# Patient Record
Sex: Male | Born: 1950 | Race: White | Hispanic: No | Marital: Married | Smoking: Former smoker
Health system: Southern US, Community
[De-identification: ages and names within clinical notes are randomized; demographics above are authoritative.]

## PROBLEM LIST (undated history)

## (undated) DIAGNOSIS — E785 Hyperlipidemia, unspecified: Secondary | ICD-10-CM

## (undated) DIAGNOSIS — I10 Essential (primary) hypertension: Secondary | ICD-10-CM

## (undated) DIAGNOSIS — Z96659 Presence of unspecified artificial knee joint: Secondary | ICD-10-CM

## (undated) DIAGNOSIS — R001 Bradycardia, unspecified: Secondary | ICD-10-CM

## (undated) DIAGNOSIS — H9319 Tinnitus, unspecified ear: Secondary | ICD-10-CM

## (undated) DIAGNOSIS — J45909 Unspecified asthma, uncomplicated: Secondary | ICD-10-CM

## (undated) DIAGNOSIS — I2510 Atherosclerotic heart disease of native coronary artery without angina pectoris: Secondary | ICD-10-CM

## (undated) DIAGNOSIS — M1990 Unspecified osteoarthritis, unspecified site: Secondary | ICD-10-CM

## (undated) DIAGNOSIS — I214 Non-ST elevation (NSTEMI) myocardial infarction: Secondary | ICD-10-CM

## (undated) DIAGNOSIS — K219 Gastro-esophageal reflux disease without esophagitis: Secondary | ICD-10-CM

## (undated) DIAGNOSIS — T7840XA Allergy, unspecified, initial encounter: Secondary | ICD-10-CM

## (undated) DIAGNOSIS — J069 Acute upper respiratory infection, unspecified: Secondary | ICD-10-CM

## (undated) DIAGNOSIS — A419 Sepsis, unspecified organism: Secondary | ICD-10-CM

## (undated) HISTORY — DX: Acute upper respiratory infection, unspecified: J06.9

## (undated) HISTORY — PX: BACK SURGERY: SHX140

## (undated) HISTORY — DX: Tinnitus, unspecified ear: H93.19

## (undated) HISTORY — PX: LUMBAR DISC SURGERY: SHX700

## (undated) HISTORY — DX: Gilbert syndrome: E80.4

## (undated) HISTORY — DX: Presence of unspecified artificial knee joint: Z96.659

## (undated) HISTORY — PX: JOINT REPLACEMENT: SHX530

## (undated) HISTORY — PX: KNEE ARTHROSCOPY: SHX127

## (undated) HISTORY — DX: Hyperlipidemia, unspecified: E78.5

## (undated) HISTORY — DX: Allergy, unspecified, initial encounter: T78.40XA

## (undated) HISTORY — PX: TOTAL KNEE ARTHROPLASTY: SHX125

---

## 1998-07-16 ENCOUNTER — Emergency Department (HOSPITAL_COMMUNITY): Admission: EM | Admit: 1998-07-16 | Discharge: 1998-07-16 | Payer: Self-pay | Admitting: Emergency Medicine

## 2000-01-02 ENCOUNTER — Encounter: Payer: Self-pay | Admitting: Neurosurgery

## 2000-01-06 ENCOUNTER — Ambulatory Visit (HOSPITAL_COMMUNITY): Admission: RE | Admit: 2000-01-06 | Discharge: 2000-01-06 | Payer: Self-pay | Admitting: Neurosurgery

## 2000-01-06 ENCOUNTER — Encounter: Payer: Self-pay | Admitting: Neurosurgery

## 2002-01-04 ENCOUNTER — Ambulatory Visit (HOSPITAL_COMMUNITY): Admission: RE | Admit: 2002-01-04 | Discharge: 2002-01-04 | Payer: Self-pay | Admitting: *Deleted

## 2004-03-26 ENCOUNTER — Inpatient Hospital Stay (HOSPITAL_COMMUNITY): Admission: RE | Admit: 2004-03-26 | Discharge: 2004-03-29 | Payer: Self-pay | Admitting: Orthopedic Surgery

## 2004-03-30 ENCOUNTER — Emergency Department (HOSPITAL_COMMUNITY): Admission: EM | Admit: 2004-03-30 | Discharge: 2004-03-30 | Payer: Self-pay | Admitting: Emergency Medicine

## 2004-09-15 ENCOUNTER — Inpatient Hospital Stay (HOSPITAL_COMMUNITY): Admission: RE | Admit: 2004-09-15 | Discharge: 2004-09-18 | Payer: Self-pay | Admitting: Orthopedic Surgery

## 2005-01-02 ENCOUNTER — Encounter: Admission: RE | Admit: 2005-01-02 | Discharge: 2005-01-02 | Payer: Self-pay | Admitting: Neurosurgery

## 2011-01-03 ENCOUNTER — Encounter: Payer: Self-pay | Admitting: Neurosurgery

## 2011-01-14 DIAGNOSIS — A419 Sepsis, unspecified organism: Secondary | ICD-10-CM

## 2011-01-14 HISTORY — DX: Sepsis, unspecified organism: A41.9

## 2011-01-14 HISTORY — PX: I & D KNEE WITH POLY EXCHANGE: SHX5024

## 2011-02-03 ENCOUNTER — Ambulatory Visit (HOSPITAL_COMMUNITY): Payer: PRIVATE HEALTH INSURANCE

## 2011-02-03 ENCOUNTER — Inpatient Hospital Stay (HOSPITAL_COMMUNITY)
Admission: AD | Admit: 2011-02-03 | Discharge: 2011-02-06 | DRG: 486 | Disposition: A | Discharge status: Home Health | Payer: PRIVATE HEALTH INSURANCE | Source: Ambulatory Visit | Attending: Orthopedic Surgery | Admitting: Orthopedic Surgery

## 2011-02-03 ENCOUNTER — Encounter (HOSPITAL_COMMUNITY): Payer: Self-pay | Admitting: Radiology

## 2011-02-03 DIAGNOSIS — T8450XA Infection and inflammatory reaction due to unspecified internal joint prosthesis, initial encounter: Principal | ICD-10-CM | POA: Diagnosis present

## 2011-02-03 DIAGNOSIS — M009 Pyogenic arthritis, unspecified: Secondary | ICD-10-CM | POA: Diagnosis present

## 2011-02-03 DIAGNOSIS — I10 Essential (primary) hypertension: Secondary | ICD-10-CM | POA: Diagnosis present

## 2011-02-03 DIAGNOSIS — R509 Fever, unspecified: Secondary | ICD-10-CM

## 2011-02-03 DIAGNOSIS — Y92009 Unspecified place in unspecified non-institutional (private) residence as the place of occurrence of the external cause: Secondary | ICD-10-CM

## 2011-02-03 DIAGNOSIS — Z96659 Presence of unspecified artificial knee joint: Secondary | ICD-10-CM

## 2011-02-03 DIAGNOSIS — K219 Gastro-esophageal reflux disease without esophagitis: Secondary | ICD-10-CM | POA: Diagnosis present

## 2011-02-03 DIAGNOSIS — B370 Candidal stomatitis: Secondary | ICD-10-CM | POA: Diagnosis not present

## 2011-02-03 DIAGNOSIS — B009 Herpesviral infection, unspecified: Secondary | ICD-10-CM | POA: Diagnosis not present

## 2011-02-03 DIAGNOSIS — D62 Acute posthemorrhagic anemia: Secondary | ICD-10-CM | POA: Diagnosis not present

## 2011-02-03 DIAGNOSIS — Y831 Surgical operation with implant of artificial internal device as the cause of abnormal reaction of the patient, or of later complication, without mention of misadventure at the time of the procedure: Secondary | ICD-10-CM | POA: Diagnosis present

## 2011-02-03 DIAGNOSIS — E876 Hypokalemia: Secondary | ICD-10-CM | POA: Diagnosis not present

## 2011-02-03 DIAGNOSIS — E785 Hyperlipidemia, unspecified: Secondary | ICD-10-CM | POA: Diagnosis present

## 2011-02-03 DIAGNOSIS — Z7982 Long term (current) use of aspirin: Secondary | ICD-10-CM

## 2011-02-03 DIAGNOSIS — J329 Chronic sinusitis, unspecified: Secondary | ICD-10-CM | POA: Diagnosis present

## 2011-02-03 DIAGNOSIS — Z87891 Personal history of nicotine dependence: Secondary | ICD-10-CM

## 2011-02-03 DIAGNOSIS — G4700 Insomnia, unspecified: Secondary | ICD-10-CM | POA: Diagnosis not present

## 2011-02-03 HISTORY — DX: Essential (primary) hypertension: I10

## 2011-02-03 LAB — CBC
HCT: 47.9 % (ref 39.0–52.0)
Hemoglobin: 17.4 g/dL — ABNORMAL HIGH (ref 13.0–17.0)
MCHC: 36.3 g/dL — ABNORMAL HIGH (ref 30.0–36.0)
MCV: 89 fL (ref 78.0–100.0)
WBC: 7.1 10*3/uL (ref 4.0–10.5)

## 2011-02-03 LAB — URINALYSIS, ROUTINE W REFLEX MICROSCOPIC
Bilirubin Urine: NEGATIVE
Hgb urine dipstick: NEGATIVE
Ketones, ur: NEGATIVE mg/dL
Nitrite: NEGATIVE
Protein, ur: NEGATIVE mg/dL
Specific Gravity, Urine: 1.022 (ref 1.005–1.030)
Urobilinogen, UA: 1 mg/dL (ref 0.0–1.0)

## 2011-02-03 LAB — C-REACTIVE PROTEIN: CRP: 14.2 mg/dL — ABNORMAL HIGH (ref <0.6)

## 2011-02-03 LAB — COMPREHENSIVE METABOLIC PANEL
Alkaline Phosphatase: 69 U/L (ref 39–117)
BUN: 11 mg/dL (ref 6–23)
Chloride: 99 mEq/L (ref 96–112)
Creatinine, Ser: 0.84 mg/dL (ref 0.4–1.5)
GFR calc non Af Amer: >60 mL/min (ref >60)
Glucose, Bld: 101 mg/dL — ABNORMAL HIGH (ref 70–99)
Potassium: 3.6 mEq/L (ref 3.5–5.1)
Total Bilirubin: 2.3 mg/dL — ABNORMAL HIGH (ref 0.3–1.2)

## 2011-02-03 LAB — SEDIMENTATION RATE: Sed Rate: 29 mm/hr — ABNORMAL HIGH (ref 0–16)

## 2011-02-03 LAB — SURGICAL PCR SCREEN: Staphylococcus aureus: NEGATIVE

## 2011-02-03 LAB — GRAM STAIN

## 2011-02-04 ENCOUNTER — Inpatient Hospital Stay (HOSPITAL_COMMUNITY): Payer: PRIVATE HEALTH INSURANCE

## 2011-02-04 DIAGNOSIS — R011 Cardiac murmur, unspecified: Secondary | ICD-10-CM

## 2011-02-04 LAB — CBC
HCT: 32.5 % — ABNORMAL LOW (ref 39.0–52.0)
MCH: 31.7 pg (ref 26.0–34.0)
MCV: 89.5 fL (ref 78.0–100.0)
Platelets: 173 10*3/uL (ref 150–400)
RBC: 3.63 MIL/uL — ABNORMAL LOW (ref 4.22–5.81)

## 2011-02-04 LAB — GRAM STAIN

## 2011-02-04 LAB — URINE CULTURE
Colony Count: NO GROWTH
Culture  Setup Time: 201202211638

## 2011-02-04 LAB — COMPREHENSIVE METABOLIC PANEL
ALT: 16 U/L (ref 0–53)
AST: 18 U/L (ref 0–37)
Alkaline Phosphatase: 44 U/L (ref 39–117)
CO2: 28 mEq/L (ref 19–32)
Chloride: 100 mEq/L (ref 96–112)
GFR calc Af Amer: >60 mL/min (ref >60)
GFR calc non Af Amer: >60 mL/min (ref >60)
Glucose, Bld: 140 mg/dL — ABNORMAL HIGH (ref 70–99)
Sodium: 138 mEq/L (ref 135–145)
Total Bilirubin: 1.8 mg/dL — ABNORMAL HIGH (ref 0.3–1.2)

## 2011-02-05 LAB — COMPREHENSIVE METABOLIC PANEL
ALT: 12 U/L (ref 0–53)
AST: 15 U/L (ref 0–37)
Albumin: 2.6 g/dL — ABNORMAL LOW (ref 3.5–5.2)
Calcium: 8 mg/dL — ABNORMAL LOW (ref 8.4–10.5)
Sodium: 138 mEq/L (ref 135–145)
Total Protein: 4.8 g/dL — ABNORMAL LOW (ref 6.0–8.3)

## 2011-02-05 LAB — GC/CHLAMYDIA PROBE AMP, URINE: GC Probe Amp, Urine: NEGATIVE

## 2011-02-05 LAB — CBC
HCT: 28.3 % — ABNORMAL LOW (ref 39.0–52.0)
MCV: 90.1 fL (ref 78.0–100.0)
RBC: 3.14 MIL/uL — ABNORMAL LOW (ref 4.22–5.81)
WBC: 5.7 10*3/uL (ref 4.0–10.5)

## 2011-02-05 LAB — WOUND CULTURE: Culture: NO GROWTH

## 2011-02-06 LAB — BASIC METABOLIC PANEL
CO2: 29 mEq/L (ref 19–32)
Calcium: 8.5 mg/dL (ref 8.4–10.5)
Chloride: 101 mEq/L (ref 96–112)
Creatinine, Ser: 0.81 mg/dL (ref 0.4–1.5)
GFR calc Af Amer: >60 mL/min (ref >60)
Glucose, Bld: 101 mg/dL — ABNORMAL HIGH (ref 70–99)

## 2011-02-06 LAB — CBC
HCT: 28.8 % — ABNORMAL LOW (ref 39.0–52.0)
Hemoglobin: 10.1 g/dL — ABNORMAL LOW (ref 13.0–17.0)
MCH: 31.6 pg (ref 26.0–34.0)
MCHC: 35.1 g/dL (ref 30.0–36.0)
RBC: 3.2 MIL/uL — ABNORMAL LOW (ref 4.22–5.81)

## 2011-02-06 LAB — WOUND CULTURE

## 2011-02-06 NOTE — Op Note (Signed)
NAME:  Louis Perez, Louis Perez NO.:  [id number]  MEDICAL RECORD NO.:  [id number]           PATIENT TYPE:  I  LOCATION:  5038                         FACILITY:  [facility]  PHYSICIAN:  Eulas Post, MD    DATE OF BIRTH:  January 06, 1951  DATE OF PROCEDURE:  02/03/2011 DATE OF DISCHARGE:                              OPERATIVE REPORT   PREOPERATIVE DIAGNOSIS:  Septic right total knee arthroplasty.  POSTOPERATIVE DIAGNOSIS:  Septic right total knee arthroplasty.  OPERATIVE PROCEDURE:  Incision and drainage was poly exchange and complete synovectomy of 3 compartments of the right total knee replacement.  ANESTHESIA:  General.  ESTIMATED BLOOD LOSS:  About 100 mL.  TOURNIQUET TIME:  1 hour and 10 minutes.  OPERATIVE IMPLANTS:  I removed a [employer] polyethylene size 9 x 12 insert and replaced it with the same size insert.  PREOPERATIVE INDICATIONS:  Mr. Sadat Sliwa is a 60 year old gentleman who presented with acute onset of bilateral knee pain, many years after bilateral total knee replacements.  He is the patient of Dr. Sherene Sires.  Dr. Thurston Hole requested my assistance in his care, as he required bilateral knee I and D with polyethylene exchange.  These surgeries were performed simultaneously, however, these were done by separate surgeons.  Dr. Thurston Hole did the left side, while I performed the right side.  Additionally, Dr. Thurston Hole had Zonia Kief, PA-C as the left side assistant and I had Skip Mayer, PA-C as the right-sided assistant.  The patient elected to undergo the above-named procedures and risks, benefits and alternatives were discussed with him by Dr. Thurston Hole.  Please see his dictation for details.  OPERATIVE PROCEDURE:  The patient was brought to the operating room, placed in supine position.  IV antibiotics were held until after cultures were taken.  The right lower extremity was prepped and draped in the usual sterile fashion.  Midline approach was made  through his previous scar.  Parapatellar incision was performed, and I performed a complete synovectomy of the superior, medial, and lateral gutters as well as inferiorly and circumferentially around the patella and posterior to the patellar tendon.  There was necrotic-appearing tissue, and it appeared that his infection probably had been going on for definitely longer than 2 days.  The patient had previously indicated that his pain became significant 2 days ago.  After complete synovectomy was performed, I debrided aggressively all around the medial and lateral gutters as well as in the back of the knee using a lamina spreader to get to the back of the knee, as well as getting access to the posterior aspect of the tibial component.  I debrided this with a rongeur and a curette and cleaned all the surfaces. I irrigated 3 L of pulse lavage, and then went back and checked the synovium again for any necrotic debris and tissue.  All of this was debrided and removed, even from the size of the implant.  The polyethylene liner was removed.  The wounds were again irrigated with another 3 L of fluid.  After 6 L have been irrigated in, I then replaced the polyethylene with a new polyethylene and I did  changed gloves and got clean instruments at this portion of the case.  The knee was again debrided of any abnormal-appearing tissue including within the box of the femoral component.  After that, I irrigated again another 3 L for a total of 9 L and then placed 2 deep drains and repaired the parapatellar tissue with #1 PDS followed by 2-0 Vicryl for the subcutaneous tissue and then staples for the skin.  Sterile gauze was applied followed by a compressive Ace wrap.  The patient was awakened and returned to PACU in stable and satisfactory condition.  There were no complications and he tolerated the procedure well.  Skip Mayer, PA-C was present and scrubbed throughout the case and was critical for  completion in assistance with retraction, exposure, and closure.     Eulas Post, MD     JPL/MEDQ  D:  02/03/2011  T:  02/04/2011  Job:  [id number]  Electronically Signed by Teryl Lucy MD on 02/06/2011 11:19:56 AM

## 2011-02-08 LAB — ANAEROBIC CULTURE

## 2011-02-10 LAB — CULTURE, BLOOD (ROUTINE X 2)
Culture  Setup Time: 201202221800
Culture: NO GROWTH

## 2011-02-19 NOTE — Discharge Summary (Signed)
NAME:  Louis Perez, Louis Perez NO.:  [id number]  MEDICAL RECORD NO.:  [id number]           PATIENT TYPE:  I  LOCATION:  5038                         FACILITY:  [facility]  PHYSICIAN:  Elana Alm. Thurston Hole, M.D. DATE OF BIRTH:  08-04-1951  DATE OF ADMISSION:  02/03/2011 DATE OF DISCHARGE:  02/06/2011                              DISCHARGE SUMMARY   ADMITTING DIAGNOSES: 1. Bilateral septic knees. 2. Hypertension. 3. Hyperlipidemia. 4. Sinusitis. 5. History of a tick bite. 6. History of septicemia.  DISCHARGE DIAGNOSES: 1. 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 2. Acute blood loss anemia. 3. Hypertension. 4. Hypokalemia. 5. Hyperlipidemia. 6. Sinusitis. 7. Tick bite. 8. Oral thrush.  HISTORY OF PRESENT ILLNESS:  The patient is a 60 year old white male with a history of bilateral total knees done in 2005.  He has done very well with both total knee replacements until this past weekend he was hospitalized at [facility] on February 01, 2011, for septicemia, a fever of 103.  At that time, he was noted to have bilateral knee effusions and felt he would rather be treated in [place] than in [place] for his bilateral swollen knees.  He presented to our office on February 03, 2011, with acutely swollen bilateral total knees.  Left total knee replacement was done on September 15, 2004, right total knee replacement was done on March 26, 2004.  In the office, his left knee was aspirated of 100 mL of obvious purulent material.  His right knee was aspirated of 60 mL of obvious purulent material.  He had been on doxycycline for 48 hours and prior to that had been on Augmentin for some sinusitis.  He was admitted to the hospital for emergent I and D of both knees which he underwent that evening.  An echo was ordered that showed some thickening on his aortic valve but no obvious vegetation.  Blood cultures were ordered both at [facility] and here in  [place], both of them have persisted to be negative.  Infectious Disease was consulted for guidance with antibiotics and routine septicemia.  TESTING:  His HIV test was negative.  His Gonorrhea and Chlamydia tests were negative.  On postop day 1, the patient's potassium was low.  He was given 40 mEq of potassium to raise his potassium level.  He remained afebrile.  His hemoglobin was stable at 11.5.  CRP was high at 14.2. Sed rate was slightly high at 29.  The patient tolerated the CPM on postop day 1.  Postop day 2, the patient improved with physical therapy. His hemoglobin was 9.8.  He was metabolically stable.  His dressings were changed.  Home health was ordered for physical therapy, occupational therapy.  He ambulated 110 feet, 180 feet, and did ambulate up to stairs.  Postop day 3, the patient was alert and oriented.  He was able to do straight-leg raises with both legs.  He was able to ambulate at least 100 feet.  He had two bowel movements overnight.  He is being discharged to home in stable condition, weightbearing as tolerated, on IV antibiotics, home health physical therapy, home health occupational  therapy.  PROCEDURES IN-HOUSE:  On February 03, 2011, the patient underwent an incision and drainage of his right knee by Dr. Dion Saucier with Dr. Sherene Sires assisting and incision and drainage of his left knee by Dr. Thurston Hole with Dr. Dion Saucier assisting. He had bilateral femoral nerve block by Anesthesia.  DISCHARGE MEDICATIONS: 1. Ceftriaxone 1 g IV daily for 6 weeks. 2. Colace 100 mg one tablet twice a day. 3. Lovenox 30 mg subcu twice a day. 4. Dilaudid to 2 mg one to two tablets q.4-6 h. as needed for pain. 5. Methocarbamol 500 mg one tablet q.8 h. as needed for muscle spasm. 6. Nystatin 1000 units/mL, 5 mL swish and spit four times a day. 7. Rifampin 300 mg one tablet twice a day. 8. Vancomycin 1500 mg twice a day for 6 weeks. 9. Artificial Tears 1 drop in both eyes four times  a day. 10.Aspirin 81 mg daily. 11.Fish oil 1000 mg two tablets twice a day. 12.Hydrochlorothiazide 12.5 mg one tablet daily. 13.Multivitamin 1 tablet daily. 14.Pravastatin 40 mg one tablet daily. 15.Ranitidine 150 mg 2 tablets at bedtime. 16.Systane eye drops one drop in both eyes. 17.Temazepam 15 mg one to two tablets at night for sleep. 18.Verapamil SR 240 mg one tablet daily. He has been instructed to stop his doxycycline.  He needs to follow up with Dr. Thurston Hole in the office on February 16, 2011. He has been instructed to begin washing his both legs with soap and water on February 10, 2011.  He has been instructed to call our office with increased pain, increased swelling, increased redness, increased drainage, or a temperature greater than 101.     Kirstin Shepperson, PA-C   ______________________________ Elana Alm Thurston Hole, M.D.    KS/MEDQ  D:  02/06/2011  T:  02/06/2011  Job:  [id number]  Electronically Signed by Julien Girt P.A. on 02/16/2011 03:47:23 PM Electronically Signed by Salvatore Marvel M.D. on 02/18/2011 02:03:56 PM

## 2011-02-19 NOTE — Op Note (Signed)
NAME:  Louis Perez, Louis Perez NO.:  [id number]  MEDICAL RECORD NO.:  [id number]           PATIENT TYPE:  I  LOCATION:  5038                         FACILITY:  [facility]  PHYSICIAN:  Elana Alm. Thurston Hole, M.D. DATE OF BIRTH:  Sep 19, 1951  DATE OF PROCEDURE:  02/03/2011 DATE OF DISCHARGE:                              OPERATIVE REPORT   PREOPERATIVE DIAGNOSIS:  Septic left total knee arthroplasty.  POSTOPERATIVE DIAGNOSIS:  Septic left total knee arthroplasty.  PROCEDURE:  Incision and drainage with polyethylene exchange and complete synovectomy of three compartments of the left total knee replacement.  SURGEON:  Elana Alm. Thurston Hole, MD  ASSISTANT:  Eulas Post, MD and Zonia Kief, PA  ANESTHESIA:  General.  OPERATIVE TIME:  One hour and 10 minutes.  COMPLICATIONS:  None.  DESCRIPTION OF PROCEDURE:  Louis Perez was brought to the operating room on February 03, 2011, after bilateral femoral nerve blocks were placed in holding by Anesthesia.  He was placed on operative table in supine position.  Louis Perez had developed bilateral septic knee arthroplasties over the last 24-48 hours.  Dr. Teryl Lucy performed the right knee surgery and he will dictate his operative note separately while I performed the left knee procedure.  After Louis Perez was placed on the operative table in supine position. The knee was placed under general anesthesia.  Both knees were examined under anesthesia.  Range of motion 0-130 degrees.  Knees were stable with normal patellar tracking.  He had a Foley catheter placed under sterile conditions.  Antibiotics would not be given until new cultures were obtained at the time of the arthrotomies.  Both legs were then prepped using sterile DuraPrep and draped using sterile technique.  Time- out procedure was called and both knees were identified as the correct surgical sites.  The left leg was then exsanguinated and a thigh tourniquet elevated to  365 mm.  Through his previous anterior total knee incision, a new incision was made.  The underlying subcutaneous tissues were incised along with skin incision.  Median arthrotomy was performed revealing purulent fluid, which was sent for aerobic and anaerobic cultures.  Thorough debridement of this fluid was carried out.  The total knee components were well visualized and the femoral and tibial components were found to be well-fixed as well as the patella component. There was a large amount of very granulomatous thickened infected appearing synovial tissue and a very aggressive three compartment synovectomy was carried out.  Both medial, lateral and anterior and superior compartments thoroughly synovectomized and very carefully the posterior compartment was synovectomized and carefully protecting the neurovascular structures.  After this was done, then the polyethylene tray in the tibial component was removed.  At this point, then 9 liters of saline jet lavage solution was pulse lavaged through the knee and all compartments of the knee.  Very meticulous further synovectomy carried out as well.  After this was done, there was found to be excellent excision of all infected soft tissues and removal of the polyethylene implant.  The new polyethylene implant, which was a size 11 for his 11 tibial component with a 12 mm thickness  was then hammered into position with an excellent fit back into his existing well-fixed tibial tray. The knee was then reduced, taken through range of motion from 0-140 degrees with excellent stability and normal patellar tracking.  At this point, tourniquet was released.  Hemostasis obtained with cautery.  The wound was further irrigated with saline and then the arthrotomy was closed with #1 PDS suture.  Subcutaneous tissues were closed with 0 and 2-0 Vicryl, and skin closed with skin staples.  Sterile dressings were applied.  Similar procedure was being done  simultaneously by Dr. Dion Saucier and again he will dictate this operative note separately.  At this point, after both knees were finished and closed, needle and sponge count was correct x2 at the end of the case.  Neurovascular status normal.  Pulses 2+ and symmetric.  He was then awakened, extubated and taken to recovery room in stable condition.     Jayshon Dommer A. Thurston Hole, M.D.     RAW/MEDQ  D:  02/04/2011  T:  02/05/2011  Job:  [id number]  Electronically Signed by Salvatore Marvel M.D. on 02/18/2011 02:03:23 PM

## 2011-02-26 ENCOUNTER — Emergency Department (HOSPITAL_COMMUNITY)
Admission: EM | Admit: 2011-02-26 | Discharge: 2011-02-27 | Disposition: A | Discharge status: Home / Self Care | Payer: PRIVATE HEALTH INSURANCE | Attending: Emergency Medicine | Admitting: Emergency Medicine

## 2011-02-26 ENCOUNTER — Emergency Department (HOSPITAL_COMMUNITY)
Admission: EM | Admit: 2011-02-26 | Discharge: 2011-02-26 | Discharge status: Against Medical Advice | Payer: PRIVATE HEALTH INSURANCE | Attending: Emergency Medicine | Admitting: Emergency Medicine

## 2011-02-26 DIAGNOSIS — Y849 Medical procedure, unspecified as the cause of abnormal reaction of the patient, or of later complication, without mention of misadventure at the time of the procedure: Secondary | ICD-10-CM | POA: Insufficient documentation

## 2011-02-26 DIAGNOSIS — I10 Essential (primary) hypertension: Secondary | ICD-10-CM | POA: Insufficient documentation

## 2011-02-26 DIAGNOSIS — M009 Pyogenic arthritis, unspecified: Secondary | ICD-10-CM | POA: Insufficient documentation

## 2011-02-26 DIAGNOSIS — Z7982 Long term (current) use of aspirin: Secondary | ICD-10-CM | POA: Insufficient documentation

## 2011-02-26 DIAGNOSIS — T82898A Other specified complication of vascular prosthetic devices, implants and grafts, initial encounter: Secondary | ICD-10-CM | POA: Insufficient documentation

## 2011-02-26 DIAGNOSIS — T8450XA Infection and inflammatory reaction due to unspecified internal joint prosthesis, initial encounter: Secondary | ICD-10-CM | POA: Insufficient documentation

## 2011-02-26 DIAGNOSIS — Z79899 Other long term (current) drug therapy: Secondary | ICD-10-CM | POA: Insufficient documentation

## 2011-02-26 DIAGNOSIS — T82598A Other mechanical complication of other cardiac and vascular devices and implants, initial encounter: Secondary | ICD-10-CM | POA: Insufficient documentation

## 2011-05-05 ENCOUNTER — Encounter: Payer: Self-pay | Admitting: Infectious Diseases

## 2011-08-11 ENCOUNTER — Other Ambulatory Visit: Payer: Self-pay | Admitting: Otolaryngology

## 2011-08-11 ENCOUNTER — Ambulatory Visit
Admission: RE | Admit: 2011-08-11 | Discharge: 2011-08-11 | Disposition: A | Discharge status: Home / Self Care | Payer: PRIVATE HEALTH INSURANCE | Source: Ambulatory Visit | Attending: Otolaryngology | Admitting: Otolaryngology

## 2011-08-11 DIAGNOSIS — R51 Headache: Secondary | ICD-10-CM

## 2011-08-15 HISTORY — PX: NASAL SINUS SURGERY: SHX719

## 2011-08-31 ENCOUNTER — Encounter (HOSPITAL_COMMUNITY)
Admission: RE | Admit: 2011-08-31 | Discharge: 2011-08-31 | Disposition: A | Discharge status: Home / Self Care | Payer: PRIVATE HEALTH INSURANCE | Source: Ambulatory Visit | Attending: Otolaryngology | Admitting: Otolaryngology

## 2011-08-31 LAB — COMPREHENSIVE METABOLIC PANEL WITH GFR
ALT: 21 U/L (ref 0–53)
AST: 20 U/L (ref 0–37)
Albumin: 4.1 g/dL (ref 3.5–5.2)
Alkaline Phosphatase: 83 U/L (ref 39–117)
BUN: 22 mg/dL (ref 6–23)
Chloride: 101 meq/L (ref 96–112)
Potassium: 4.1 meq/L (ref 3.5–5.1)
Sodium: 141 meq/L (ref 135–145)
Total Bilirubin: 2.5 mg/dL — ABNORMAL HIGH (ref 0.3–1.2)

## 2011-08-31 LAB — CBC
HCT: 49.3 % (ref 39.0–52.0)
Hemoglobin: 17.6 g/dL — ABNORMAL HIGH (ref 13.0–17.0)
MCH: 31.8 pg (ref 26.0–34.0)
MCHC: 35.7 g/dL (ref 30.0–36.0)
MCV: 89 fL (ref 78.0–100.0)
Platelets: 205 10*3/uL (ref 150–400)
RBC: 5.54 MIL/uL (ref 4.22–5.81)
RDW: 13.4 % (ref 11.5–15.5)
WBC: 5.5 K/uL (ref 4.0–10.5)

## 2011-08-31 LAB — SURGICAL PCR SCREEN
MRSA, PCR: NEGATIVE
Staphylococcus aureus: NEGATIVE

## 2011-08-31 LAB — COMPREHENSIVE METABOLIC PANEL
CO2: 31 mEq/L (ref 19–32)
Calcium: 10 mg/dL (ref 8.4–10.5)
Creatinine, Ser: 0.83 mg/dL (ref 0.50–1.35)
GFR calc Af Amer: >60 mL/min (ref >60)
GFR calc non Af Amer: >60 mL/min (ref >60)
Glucose, Bld: 93 mg/dL (ref 70–99)
Total Protein: 7 g/dL (ref 6.0–8.3)

## 2011-09-03 ENCOUNTER — Ambulatory Visit (HOSPITAL_COMMUNITY)
Admission: RE | Admit: 2011-09-03 | Discharge: 2011-09-04 | Disposition: A | Discharge status: Home / Self Care | Payer: PRIVATE HEALTH INSURANCE | Source: Ambulatory Visit | Attending: Otolaryngology | Admitting: Otolaryngology

## 2011-09-03 ENCOUNTER — Other Ambulatory Visit: Payer: Self-pay | Admitting: Otolaryngology

## 2011-09-03 DIAGNOSIS — Z96659 Presence of unspecified artificial knee joint: Secondary | ICD-10-CM | POA: Insufficient documentation

## 2011-09-03 DIAGNOSIS — J320 Chronic maxillary sinusitis: Secondary | ICD-10-CM | POA: Insufficient documentation

## 2011-09-03 DIAGNOSIS — K219 Gastro-esophageal reflux disease without esophagitis: Secondary | ICD-10-CM | POA: Insufficient documentation

## 2011-09-03 DIAGNOSIS — Z01810 Encounter for preprocedural cardiovascular examination: Secondary | ICD-10-CM | POA: Insufficient documentation

## 2011-09-03 DIAGNOSIS — J323 Chronic sphenoidal sinusitis: Secondary | ICD-10-CM | POA: Insufficient documentation

## 2011-09-03 DIAGNOSIS — H9319 Tinnitus, unspecified ear: Secondary | ICD-10-CM | POA: Insufficient documentation

## 2011-09-03 DIAGNOSIS — I10 Essential (primary) hypertension: Secondary | ICD-10-CM | POA: Insufficient documentation

## 2011-09-03 DIAGNOSIS — J322 Chronic ethmoidal sinusitis: Secondary | ICD-10-CM | POA: Insufficient documentation

## 2011-09-03 DIAGNOSIS — Z01812 Encounter for preprocedural laboratory examination: Secondary | ICD-10-CM | POA: Insufficient documentation

## 2011-09-04 NOTE — H&P (Signed)
NAME:  Louis Perez, Louis Perez NO.:  [id number]  MEDICAL RECORD NO.:  [id number]  LOCATION:  SDSC                         FACILITY:  [facility]  PHYSICIAN:  Hermelinda Medicus, M.D.   DATE OF BIRTH:  05-08-1951  DATE OF ADMISSION:  09/03/2011 DATE OF DISCHARGE:                             HISTORY & PHYSICAL   HISTORY OF PRESENT ILLNESS:  This patient is a 60 year old male who has had considerable history of sinusitis problems.  He has had also some right ear tinnitus with clicking.  He has had this with some noise exposure in the past, but his history is furthermore complicated by the fact that he has had bilateral knee replacements and has had bilateral total knee infections and it was 6-1/2 months of irrigation and debridement and was on considerable amount of IV vancomycin and Rocephin.  Both knees have no swelling on final analysis and doing very well, but he has also had several sinus infections and has been on the course of vancomycin.  He has been on the cephalosporins and has also been on Flagyl.  The patient now enters for functional endoscopic sinus surgery, bilateral ethmoidectomy, sphenoidectomy, and maxillary sinus ostial enlargement as his CAT scan has shown considerable obstruction, in fact about 95% obstruction of the ethmoid and 50% obstruction of the maxillary and sphenoid.  He also has had an MRI of his head which was unclear.  As we were concerned about his ear symptoms, we felt possibly and more probably some of the symptoms are coming from his chronic sinusitis and drainage.  ALLERGIES:  His past history is one of allergies to OXYCODONE.  MEDICATIONS:  He takes Flagyl, verapamil, fish oil, aspirin, multivitamins, Pravastatin, ranitidine, and temazepam.  PAST MEDICAL HISTORY:  His medical history is significant for the fact that he does have some hypertension and his blood pressure was 160/100 this morning.  With the chronic sinusitis, he does have  elevated cholesterol and GERD.  FAMILY HISTORY:  Significant for diabetes, hypertension, and heart disease.  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  He does not smoke.  He does use alcohol just occasionally and has never used any drugs.  REVIEW OF SYSTEMS:  Just significant at this point, his knees are in good condition but he does have considerable headache with sinus discomfort.  Our plan is considering we will have to do functional endoscopic sinus surgery to try to open these sinuses to get them to drain.  PHYSICAL EXAMINATION:  VITAL SIGNS:  Blood pressure of 160/100. HEENT:  His ears are completely clear.  Tympanic membranes are clear. 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  His nasal examination reveals nasal polyps around the sphenoid and ethmoid region which were small but are tend to be obstructive in nature and considerable congestion in this region.  He has had some nasal fracture in the past but his septum is in quite good condition but his turbinates are very large and are pushing over against the natural ostia of his maxillary sinuses.  He complains primarily of frontal headache  but I think the ethmoids are the major causative of this problem as the frontal straights into the ethmoids.  His oral cavity is completely clear.  True cords, false cord, epiglottis, base of tongue, lateral pharyngeal walls are also clear of ulceration, mass, or lesion and true cord mobility, gag reflex, tongue mobility, EOMs, facial nerve are all symmetrical. NECK:  Free of any thyromegaly, cervical adenopathy, or mass.  Posterior triangles and scalp and skin are clear. CHEST:  Clear.  No rales, rhonchi or wheezes. CARDIOVASCULAR:  Normal S1 and S2.  No murmurs, rubs, or gallops. Normal EKG. EXTREMITIES:  His knees per Dr. Thurston Hole having had bilateral  total knees replacements with wound infections, but at this point stable and are clear of any infection.  INITIAL DIAGNOSES: 1. Pansinusitis. 2. History of bilateral knee replacements with postoperative     infections. 3. History of right ear tinnitus and decreased hearing and noise     exposure. 4. History of vancomycin use.          ______________________________ Hermelinda Medicus, M.D.     JC/MEDQ  D:  09/03/2011  T:  09/03/2011  Job:  [id number]  cc:   Molly Maduro A. Thurston Hole, M.D. Josephina Shih, M.D.  Electronically Signed by Hermelinda Medicus M.D. on 09/04/2011 09:33:46 AM

## 2011-09-04 NOTE — Op Note (Signed)
NAME:  Louis, Perez NO.:  [id number]  MEDICAL RECORD NO.:  [id number]  LOCATION:  SDSC                         FACILITY:  [facility]  PHYSICIAN:  Hermelinda Medicus, M.D.   DATE OF BIRTH:  [birth year]  DATE OF PROCEDURE:  09/03/2011 DATE OF DISCHARGE:                              OPERATIVE REPORT   HISTORY:  Mr. Louis Perez has had persistent pansinusitis, has had wound infections of his knees, and has had persistent headaches and also has had right ear symptoms.  He is aware of the risks and gains, has read the book on endoscopic sinus surgery.  The risk of working around the eyes, the base of skull, and its polyps, and CAT scan had been almost 90% obstructive to the ethmoid and 50-70% obstructive to the maxillary and sphenoid.  PREOPERATIVE DIAGNOSIS:  Bilateral ethmoid, bilateral sphenoid, and bilateral maxillary sinusitis with turbinate hypertrophy.  POSTOPERATIVE DIAGNOSIS:  Bilateral ethmoid, bilateral sphenoid, and bilateral maxillary sinusitis with turbinate hypertrophy.  OPERATION:  Functional endoscopic sinus surgery, bilateral ethmoidectomy, bilateral sphenoidectomy, and bilateral maxillary sinus ostial enlargement with reduction of inferior turbinates with Elmed bipolar cautery and reduction of the middle turbinates and medialization to gain space for the ethmoid sinuses.  ANESTHESIA:  Local MAC with Dr. Ivin Booty and 1% Xylocaine with epinephrine 9 mL and topical cocaine 200 mg.  OPERATOR:  Hermelinda Medicus, MD  DESCRIPTION OF PROCEDURE:  The patient was placed in supine position, under local MAC anesthesia, he was prepped and draped in usual Hibiclens and usual head drapes were used.  The patient was anesthetized using the topical first and then the 1% Xylocaine with epinephrine and inferior turbinates were aggressively outfractured and cauterized with the Elmed. The middle turbinates were medially oriented and were found to be large on the  fascial left side and was decreased in size just as there was some mild concha bullosa.  The sphenoid sinus was first approached where we found polyps around the natural ostium, but we could find the natural ostium and then penetrated this made it considerably larger and removed the polyps around this obstructing the sphenoid sinuses, did this on the right and the left with no difficulty.  Both sinuses were suctioned of some mucus, but the polyps were just at the openings of ostia.  The ethmoid sinus was just filled with nasal polyps with a polypoid debris and extended out into the nasal polypoid area as well.  We made the middle turbinates more medial and then entered the ethmoid sinus on the left using the 0 in the upbiting [place] using the straight and the upbiting Shann Medal in the 0170 and 30 degrees scopes and removed a considerable polypoid debris from the sinuses getting them to drain more appropriately.  Once this was completed, we then approached the right side and again medialized the middle turbinate, did not remove any mucous membrane, but removed polyps from the exit of the natural ostium of the ethmoid sinus and entered the sinus and again was filled with polypoid heavy polypoid debris.  All these were done separately for microscopic evaluation as well.  Using the upbiting and then the straight Shann Medal in the 70 and 30 degrees scopes.  We reviewed the entire ethmoid sinus which was about 90% obstructed were filled with polyps.  Once this was completed, we then approached the maxillary sinuses, found the natural ostium by palpation as it was just a pinhole, you could really not see it well but you could feel as you feel through this with a curved suction and then the side-biting forceps were used to increase the size of the natural ostium approximately five times a normal size and we used a side-biting as well as the [place] as well as the angled [employer].  The anterior of the sinus which showed some edema as it did around the ostium, but once this ostium left further drainage.  I think this will revert back to its more normal status.  Once this was completed, we placed Gelfoam in the ethmoid sinus, Gelfilm in the medial middle turbinates and 12 cm of Merocel packs with bacitracin ointment to keep swelling down.  Total blood loss was estimated at 15 mL.  The patient tolerated the procedure very well under local MAC and was in the recovery room doing well, will be kept overnight because of his the use of the heavier antibiotics which we used vancomycin an hour before this procedure 1.5 g.  We used Unasyn as advised by Dr. Ninetta Lights, at the Infectious Disease Department, 3 doses of 3 g and then will continue on with Keflex postop.  Followup will be overnight 23-hour observation and then it will be 5 days, 10 days, 3 weeks, 6 weeks, 3 months, 6 months, and a year.          ______________________________ Hermelinda Medicus, M.D.     JC/MEDQ  D:  09/03/2011  T:  09/03/2011  Job:  [id number]  cc:   Dr. Timmie Foerster A. Thurston Hole, M.D.  Electronically Signed by Hermelinda Medicus M.D. on 09/04/2011 09:33:49 AM

## 2011-09-09 NOTE — Discharge Summary (Signed)
NAME:  Louis Perez, Louis Perez NO.:  [id number]  MEDICAL RECORD NO.:  [id number]  LOCATION:  5114                         FACILITY:  [facility]  PHYSICIAN:  Hermelinda Medicus, M.D.   DATE OF BIRTH:  Apr 30, 1951  DATE OF ADMISSION:  09/03/2011 DATE OF DISCHARGE:                              DISCHARGE SUMMARY   HISTORY OF PRESENT ILLNESS:  This patient is a 60 year old male who has had considerable history of sinusitis problems.  He has had several rounds of antibiotics for his sinuses and has been seen by several physicians about this.  He also has a right ear tinnitus and clicking sensation that appears to be somewhat of a eustachian tube issue.  He also has some tinnitus secondary to some nose exposure as he was a Nurse, adult and chief of police in [place] and he has had also done some noisy work where he did protect his ears very well noticing this about a year ago.  His history is further complicated by the fact that he has had bilateral knee replacements and had bilateral total knee infections with a 6-1/2 months of irrigations and debridement with considerable IV vancomycin exposure as well as Rocephin IV.  Both knees have no swelling at this time and are doing very well and he still has, however, the sinusitis.  He has been on the cephalosporins, he has been on Flagyl, he has been on vancomycin but his CAT scan of recent shows partial opacification of the sphenoid, the ethmoid sinuses are totally opacified and involve 95% of this material and the maxillary sinuses are showing considerable thickening of fluid levels.  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  He has also had an MRI of his head which was clear of any abnormalities or lesions or tumor. So, with this situation, our plan is to try to do the sinus surgery to try to get his sinuses and his infections under  control to try to help the potential of the future knee infections and also possibly getting the sinus and all the drainage under control, the eustachian tube function would be better and that right ear would do better and have fewer symptoms.  ALLERGIES:  Severe allergy to OXYCODONE with anaphylaxis and throat swelling.  MEDICATIONS:  He also is taking Flagyl, verapamil, fish oil, aspirin, multivitamins, pravastatin, midodrine, and temazepam.  PAST MEDICAL HISTORY:  Significant and the fact that he has had some blood pressure issues and the blood pressure was 160/100 this morning. He also of course had a gastroesophageal reflux and some cholesterol elevation other than his sinusitis, his ear problem, and his total knee surgery with infection.  FAMILY HISTORY:  Significant for diabetes, hypertension, and heart disease.  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  He does not smoke, uses alcohol occasionally.  REVIEW OF SYSTEMS:  Significant that his knees are in good condition now and have no evidence of any infection.  He does have some considerable headache secondary to the sinus infections.  PHYSICAL EXAMINATION:  VITAL SIGNS:  Blood pressure 160/100. HEENT:  His  ears are completely clear.  Tympanic membranes move well and it showed no evidence of any abnormality within the tympanic membranes except for some slight sluggishness to the tympanic membranes.  He does have some nose exposure history.  He still complains about the clicking and the mild disability about this situation with some vertigo which makes him state he does not want to drive.  His nasal examination reveals nasal polyps around the ethmoid and sphenoid regions posteriorly.  These polyps were, however, small.  His ethmoid, sphenoid, and maxillary sinuses did not show obvious purulent drainage.  He had been on antibiotics but they have in the past.  He states he had a history of some nasal  fracture but I no deformity.  Septum is in quite good condition.  Middle turbinates are quite large.  The left shows what appears to be a concha bullosa.  He complains primarily of frontal headache today, especially in the right side.  The frontals are directly related to the ethmoids and the ethmoids are totally obstructed.  Oral cavity is completely clear.  True cords, false cords, epiglottis, base of tongue, lateral pharyngeal walls are clear and true cord mobility, gag reflex, tongue mobility, EOMs, and facial nerve are all symmetrical. No salivary gland abnormalities.  The lips, teeth, and gums are unremarkable. NECK:  Free of any thyromegaly, cervical adenopathy, or mass. CHEST:  Clear.  No rales, rhonchi, or wheezes. CARDIOVASCULAR:  Normal S1-S2.  No murmurs, opening snaps, or gallops. Normal EKG. EXTREMITIES:  Knees per Dr. Thurston Hole having had bilateral total knee replacements with wound infections, but stable and doing well.  INITIAL DIAGNOSES: 1. Pansinusitis. 2. History of bilateral knee replacements with postoperative     infections. 3. History of right ear tinnitus with decreased hearing with noise     exposure and history of vancomycin use as well as cephalosporin     use.  PLAN:  To do sinus surgery via endoscopic approach under local MAC anesthesia and we did a bilateral sphenoidectomy with bilateral ethmoidectomy with bilateral maxillary sinus ostial enlargement.  He did extremely well.  Blood loss was estimated at 50 mL and he has done well postoperatively.  LABORATORY DATA:  His labs have been essentially unremarkable.  He is MRSA negative.  His electrolytes are perfectly normal and his hematocrit was 49.3 with a white count of 5.5.  CT of his paranasal sinuses were in the chart and showed the mucosal thickening on the sinuses, and the sphenoid and the ethmoid air cells of course are blocked and the maxillary sinuses showing considerable thickening.  Chest x-ray  showed the PICC line tip overlies the costochondral junction.  This was done back on February 03, 2011.  No active cardiopulmonary process.  EKG was normal.  The patient has done extremely well postoperatively.  Packing was removed today with minimal bleeding and discomfort and his plan is improved.  FINAL DIAGNOSES: 1. 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 2. Elevated cholesterol. 3. Gastroesophageal reflux disease. 4. Blood pressure elevation. 5. History of right ear tinnitus with cracking sensation with mild     vertigo felt to be secondary possibly to noise exposure, vancomycin     use, or sinus posterior drainage.  He is improved.  His family have been informed and are caring for him at home and his followup would be in my office on September 07, 2011 three days from now and then in 1 week, 3 weeks, 6  weeks, 3 months, and a year.  His purpose for admission was the risk and the need for antibiotic.  He also has a anaphylaxis to oxycodone where he will be given morphine postoperatively 7.5 mg q.6 h.  He has been warned that he cannot use Ultram, Tylenol with Codeine, oxycodone.  He can use the morphine but that is highly habit-forming.          ______________________________ Hermelinda Medicus, M.D.     JC/MEDQ  D:  09/04/2011  T:  09/04/2011  Job:  [id number]  cc:   Molly Maduro A. Thurston Hole, M.D. Hale Bogus, M.D.  Electronically Signed by Hermelinda Medicus M.D. on 09/09/2011 09:02:51 AM

## 2012-05-26 HISTORY — PX: TRANSTHORACIC ECHOCARDIOGRAM: SHX275

## 2012-05-26 HISTORY — PX: CARDIOVASCULAR STRESS TEST: SHX262

## 2013-05-11 ENCOUNTER — Other Ambulatory Visit: Payer: Self-pay

## 2013-05-11 ENCOUNTER — Telehealth: Payer: Self-pay | Admitting: Neurology

## 2013-05-11 MED ORDER — OXCARBAZEPINE 150 MG PO TABS: 300.0000 mg | ORAL_TABLET | Freq: Three times a day (TID) | ORAL | Status: DC

## 2013-05-11 NOTE — Telephone Encounter (Signed)
Former [facility] patient, has not been assigned new MD.  Usually sees [place].  Auth refill through her and noted appt will be needed.  Eber Jones will assign new provider when she sees the patient.

## 2013-05-11 NOTE — Telephone Encounter (Signed)
Rx was already sent to the pharmacy at 10:28 this morning.  Please see chart.

## 2013-06-02 ENCOUNTER — Ambulatory Visit (INDEPENDENT_AMBULATORY_CARE_PROVIDER_SITE_OTHER): Payer: PRIVATE HEALTH INSURANCE | Admitting: Internal Medicine

## 2013-06-02 ENCOUNTER — Encounter: Payer: Self-pay | Admitting: Internal Medicine

## 2013-06-02 VITALS — BP 142/98 | HR 66 | Temp 97.1°F | Ht 75.0 in | Wt 219.0 lb

## 2013-06-02 DIAGNOSIS — J45901 Unspecified asthma with (acute) exacerbation: Secondary | ICD-10-CM

## 2013-06-02 DIAGNOSIS — R0602 Shortness of breath: Secondary | ICD-10-CM

## 2013-06-02 DIAGNOSIS — J4521 Mild intermittent asthma with (acute) exacerbation: Secondary | ICD-10-CM

## 2013-06-02 DIAGNOSIS — I10 Essential (primary) hypertension: Secondary | ICD-10-CM

## 2013-06-02 MED ORDER — AMOXICILLIN-POT CLAVULANATE 875-125 MG PO TABS: 1.0000 | ORAL_TABLET | Freq: Two times a day (BID) | ORAL | Status: DC

## 2013-06-02 MED ORDER — PREDNISONE (PAK) 10 MG PO TABS: ORAL_TABLET | ORAL | Status: DC

## 2013-06-02 MED ORDER — MOMETASONE FURO-FORMOTEROL FUM 200-5 MCG/ACT IN AERO: INHALATION_SPRAY | RESPIRATORY_TRACT | Status: DC

## 2013-06-02 MED ORDER — METHYLPREDNISOLONE ACETATE 80 MG/ML IJ SUSP
120.0000 mg | Freq: Once | INTRAMUSCULAR | Status: AC
  Administered 2013-06-02: 120 mg via INTRAMUSCULAR

## 2013-06-02 NOTE — Progress Notes (Addendum)
  Subjective:    Patient ID: Louis Perez, male    DOB: [birth year]  MRN: [id number]  HPI  58 yowm smoked only as teenager with chronic nasal congestion x decades worse in winters s/p sinus surgery 2013 then began having cough/ wheeze referred 06/02/2013 to pulmonary clinic by Dr Rosezetta Schlatter.  06/02/2013 1st pulmonary eval cc cough starting "shortly after" sinus surgery then breathing problems esp last 6 months assoc with change in voice. Onset of cough and sob was gradual, progressively worse with sense of chest congestion and production of thick green mucus but no vomiting.  No obvious daytime variabilty or assoc cp or overt  hb symptoms. No unusual exp hx or h/o childhood pna/ asthma or knowledge of premature birth.   Sleeping ok without nocturnal  or early am exacerbation  of respiratory  c/o's or need for noct saba. Also denies any obvious fluctuation of symptoms with weather or environmental changes or other aggravating or alleviating factors except as outlined above    Review of Systems  Constitutional: Negative for fever, chills, diaphoresis, activity change, appetite change, fatigue and unexpected weight change.  HENT: Positive for voice change. Negative for hearing loss, ear pain, nosebleeds, congestion, sore throat, facial swelling, rhinorrhea, sneezing, mouth sores, trouble swallowing, neck pain, neck stiffness, dental problem, postnasal drip, sinus pressure, tinnitus and ear discharge.   Eyes: Negative for photophobia, discharge, itching and visual disturbance.  Respiratory: Positive for cough, chest tightness, shortness of breath and wheezing. Negative for apnea, choking and stridor.   Cardiovascular: Negative for chest pain, palpitations and leg swelling.  Gastrointestinal: Negative for nausea, vomiting, abdominal pain, constipation, blood in stool and abdominal distention.  Genitourinary: Negative for dysuria, urgency, frequency, hematuria, flank pain, decreased urine volume and  difficulty urinating.  Musculoskeletal: Negative for myalgias, back pain, joint swelling, arthralgias and gait problem.  Skin: Negative for color change, pallor and rash.  Neurological: Negative for dizziness, tremors, seizures, syncope, speech difficulty, weakness, light-headedness, numbness and headaches.  Hematological: Negative for adenopathy. Does not bruise/bleed easily.  Psychiatric/Behavioral: Negative for confusion, sleep disturbance and agitation. The patient is not nervous/anxious.        Objective:   Physical Exam  Amb mod anxious wm nad with nl vital sins x for elevated bp  HEENT: nl dentition, turbinates, and orophanx. Nl external ear canals without cough reflex   NECK :  without JVD/Nodes/TM/ nl carotid upstrokes bilaterally   LUNGS: no acc muscle use, mid exp wheeze bilaterally  CV:  RRR  no s3 or murmur or increase in P2, no edema   ABD:  soft and nontender with nl excursion in the supine position. No bruits or organomegaly, bowel sounds nl  MS:  warm without deformities, calf tenderness, cyanosis or clubbing  SKIN: warm and dry without lesions    NEURO:  alert, approp, no deficits  cxr one month prior to OV per pt ok  Per [place]          Assessment & Plan:

## 2013-06-02 NOTE — Patient Instructions (Addendum)
augmentin 875 twice daily x 10 days Prednisone 10 mg take  4 each am x 2 days,   2 each am x 2 days,  1 each am x 2 days and stop  Dulera 200 Take 2 puffs first thing in am and then another 2 puffs about 12 hours later.   Only use your albuterol(ventolin) as a rescue medication to be used if you can't catch your breath by resting or doing a relaxed purse lip breathing pattern. The less you use it, the better it will work when you need it. Ok to use up to 2 puffs every 4 hours - don't leave home without the rescue inhaler  Pantoprazole (protonix) 40 mg   Take 30-60 min before first meal of the day and Pepcid 20 mg one bedtime until return to office - this is the best way to tell whether stomach acid is contributing to your problem.    GERD (REFLUX)  is an extremely common cause of respiratory symptoms, many times with no significant heartburn at all.    It can be treated with medication, but also with lifestyle changes including avoidance of late meals, excessive alcohol, smoking cessation, and avoid fatty foods, chocolate, peppermint, colas, red wine, and acidic juices such as orange juice.  NO MINT OR MENTHOL PRODUCTS SO NO COUGH DROPS  USE SUGARLESS CANDY INSTEAD (jolley ranchers or Stover's)  NO OIL BASED VITAMINS - use powdered substitutes- stop fish oil  For cough > mucinex dm 1200 mg every 12 hours  Please schedule a follow up office visit in 4 weeks, sooner if needed

## 2013-06-05 DIAGNOSIS — J4541 Moderate persistent asthma with (acute) exacerbation: Secondary | ICD-10-CM | POA: Insufficient documentation

## 2013-06-05 DIAGNOSIS — I10 Essential (primary) hypertension: Secondary | ICD-10-CM | POA: Insufficient documentation

## 2013-06-05 NOTE — Assessment & Plan Note (Signed)
DDX of  difficult airways managment all start with A and  include Adherence, Ace Inhibitors, Acid Reflux, Active Sinus Disease, Alpha 1 Antitripsin deficiency, Anxiety masquerading as Airways dz,  ABPA,  allergy(esp in young), Aspiration (esp in elderly), Adverse effects of DPI,  Active smokers, plus two Bs  = Bronchiectasis and Beta blocker use..and one C= CHF   Adherence is always the initial "prime suspect" and is a multilayered concern that requires a "trust but verify" approach in every patient - starting with knowing how to use medications, especially inhalers, correctly, keeping up with refills and understanding the fundamental difference between maintenance and prns vs those medications only taken for a very short course and then stopped and not refilled. The proper method of use, as well as anticipated side effects, of a metered-dose inhaler are discussed and demonstrated to the patient. Improved effectiveness after extensive coaching during this visit to a level of approximately  50% so start dulera 200 2bid and see back in 2 weeks to be sure he's mastering this  ? Active sinus dz > suspect ongoing rhinitis with purulent pnds > rx augmentin x 10 days  ? Acid reflux > always a concern in this setting and difficult to exclude to will go ahead and rx vigorously.

## 2013-06-05 NOTE — Assessment & Plan Note (Signed)
Already on rx and likely elevated from anxiety related to asthma/sob > recheck in 2 weeks

## 2013-06-06 ENCOUNTER — Other Ambulatory Visit: Payer: Self-pay

## 2013-06-06 MED ORDER — PRAVASTATIN SODIUM 80 MG PO TABS: 80.0000 mg | ORAL_TABLET | Freq: Every day | ORAL | Status: DC

## 2013-06-06 NOTE — Telephone Encounter (Signed)
Rx was called in to pharmacy. 

## 2013-06-14 ENCOUNTER — Institutional Professional Consult (permissible substitution): Payer: PRIVATE HEALTH INSURANCE | Admitting: Internal Medicine

## 2013-06-28 ENCOUNTER — Ambulatory Visit: Payer: Self-pay | Admitting: Neurology

## 2013-06-30 ENCOUNTER — Encounter: Payer: Self-pay | Admitting: Internal Medicine

## 2013-06-30 ENCOUNTER — Ambulatory Visit (INDEPENDENT_AMBULATORY_CARE_PROVIDER_SITE_OTHER): Payer: Managed Care, Other (non HMO) | Admitting: Internal Medicine

## 2013-06-30 VITALS — BP 124/90 | HR 58 | Temp 98.0°F | Ht 75.0 in | Wt 217.0 lb

## 2013-06-30 DIAGNOSIS — J4520 Mild intermittent asthma, uncomplicated: Secondary | ICD-10-CM

## 2013-06-30 DIAGNOSIS — J45909 Unspecified asthma, uncomplicated: Secondary | ICD-10-CM

## 2013-06-30 MED ORDER — MOMETASONE FURO-FORMOTEROL FUM 100-5 MCG/ACT IN AERO: INHALATION_SPRAY | RESPIRATORY_TRACT | Status: DC

## 2013-06-30 NOTE — Patient Instructions (Addendum)
Stop protonix and just try the rantidine 150 after bfast and be careful with your diet   Reduce dulera to 100  Take 2 puffs first thing in am and then another 2 puffs about 12 hours later.   Please schedule a follow up office visit in 6 weeks, call sooner if needed

## 2013-06-30 NOTE — Progress Notes (Signed)
Subjective:    Patient ID: Louis Perez, male    DOB: 10-28-1951  MRN: [id number]    Brief patient profile:  69 yowm smoked only as teenager with chronic nasal congestion x decades worse in winters s/p sinus surgery 2013 then began having cough/ wheeze referred 06/02/2013 to pulmonary clinic by Louis Perez.   HPI 06/02/2013 1st pulmonary eval cc cough starting "shortly after" sinus surgery then breathing problems esp last 6 months assoc with change in voice. Onset of cough and sob was gradual, progressively worse with sense of chest congestion and production of thick green mucus but no vomiting. rec augmentin 875 twice daily x 10 days Prednisone 10 mg take  4 each am x 2 days,   2 each am x 2 days,  1 each am x 2 days and stop  Dulera 200 Take 2 puffs first thing in am and then another 2 puffs about 12 hours later.  Only use your albuterol(ventolin) as a rescue medication  - don't leave home without the rescue inhaler Pantoprazole (protonix) 40 mg   Take 30-60 min before first meal of the day and Pepcid 20 mg one bedtime until return to office - this is the best way to tell whether stomach acid is contributing to your problem.     06/30/2013 f/u ov/Louis Perez re cough Chief Complaint  Patient presents with  . Follow-up    Breathing doing much better, no co's today.   no need at all for saba, not limited by breathing.    No obvious daytime variabilty or assoc cp or overt  hb symptoms. No unusual exp hx or h/o childhood pna/ asthma or knowledge of premature birth.   Sleeping ok without nocturnal  or early am exacerbation  of respiratory  c/o's or need for noct saba. Also denies any obvious fluctuation of symptoms with weather or environmental changes or other aggravating or alleviating factors except as outlined above    Current Medications, Allergies, Past Medical History, Past Surgical History, Family History, and Social History were reviewed in [employer]  record.  ROS  The following are not active complaints unless bolded sore throat, dysphagia, dental problems, itching, sneezing,  nasal congestion or excess/ purulent secretions, ear ache,   fever, chills, sweats, unintended wt loss, pleuritic or exertional cp, hemoptysis,  orthopnea pnd or leg swelling, presyncope, palpitations, heartburn, abdominal pain, anorexia, nausea, vomiting, diarrhea  or change in bowel or urinary habits, change in stools or urine, dysuria,hematuria,  rash, arthralgias, visual complaints, headache, numbness weakness or ataxia or problems with walking or coordination,  change in mood/affect or memory.             Objective:   Physical Exam  Amb mod anxious wm nad with nl vital signs   Wt Readings from Last 3 Encounters:  06/30/13 217 lb (98.431 kg)  06/02/13 219 lb (99.338 kg)     HEENT: nl dentition, turbinates, and orophanx. Nl external ear canals without cough reflex   NECK :  without JVD/Nodes/TM/ nl carotid upstrokes bilaterally   LUNGS: no acc muscle use, no wheeze at all, good air movement   CV:  RRR  no s3 or murmur or increase in P2, no edema   ABD:  soft and nontender with nl excursion in the supine position. No bruits or organomegaly, bowel sounds nl  MS:  warm without deformities, calf tenderness, cyanosis or clubbing             Assessment &  Plan:

## 2013-07-02 NOTE — Assessment & Plan Note (Signed)
All goals of chronic asthma control met including optimal function and elimination of symptoms with minimal need for rescue therapy.  Contingencies discussed in full including contacting this office immediately if not controlling the symptoms using the rule of two's.     The proper method of use, as well as anticipated side effects, of a metered-dose inhaler are discussed and demonstrated to the patient. Improved effectiveness after extensive coaching during this visit to a level of approximately  90% so should be ok on just the dulera 100 2bid  See instructions for specific recommendations which were reviewed directly with the patient who was given a copy with highlighter outlining the key components.

## 2013-07-04 ENCOUNTER — Encounter: Payer: Self-pay | Admitting: Infectious Diseases

## 2013-07-06 ENCOUNTER — Encounter: Payer: Self-pay | Admitting: Neurology

## 2013-07-06 ENCOUNTER — Ambulatory Visit: Payer: Self-pay | Admitting: Neurology

## 2013-07-07 ENCOUNTER — Encounter: Payer: Self-pay | Admitting: Neurology

## 2013-07-07 ENCOUNTER — Ambulatory Visit (INDEPENDENT_AMBULATORY_CARE_PROVIDER_SITE_OTHER): Payer: Managed Care, Other (non HMO) | Admitting: Neurology

## 2013-07-07 VITALS — BP 137/85 | HR 74 | Ht 75.0 in | Wt 215.0 lb

## 2013-07-07 DIAGNOSIS — H5319 Other subjective visual disturbances: Secondary | ICD-10-CM

## 2013-07-07 NOTE — Patient Instructions (Signed)
Overall you are doing fairly well but I do want to suggest a few things today:   Remember to drink plenty of fluid, eat healthy meals and do not skip any meals. Try to eat protein with a every meal and eat a healthy snack such as fruit or nuts in between meals. Try to keep a regular sleep-wake schedule and try to exercise daily, particularly in the form of walking, 20-30 minutes a day, if you can.   As far as your medications are concerned, I would like to suggest you continue on the Trileptal 300mg  bid.   As far as diagnostic testing I suggest you follow up with Dr Delfino Lovett at [facility]. You can call [phone] to schedule an appointment with his office. You can also consider vestibular-rehab in the future.   Please follow up with [facility]     My clinical assistant and will answer any of your questions and relay your messages to me and also relay most of my messages to you.   Our phone number is [phone]. We also have an after hours call service for urgent matters and there is a physician on-call for urgent questions. For any emergencies you know to call 911 or go to the nearest emergency room

## 2013-07-07 NOTE — Progress Notes (Signed)
Provider:  Dr Hosie Poisson Referring Provider: Delorse Lek, MD Primary Care Physician:  Delorse Lek, MD    HPI:  Louis Perez is a 62 y.o. male here as a follow up, his last visit was on 07/2012 with Dr Avie Echevaria. 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  Since last visit he reports the severity and intensity of his symptoms has worsened. Since December is having daily episodes of R-ear cracking/popping sounds, these occur intermittently throughout the day, every day. Can last seconds to minutes. With these episodes he is also noticing a sensation of rapid vertical eye ovements/oscilations of his visual fields which per the patients description sounds to be oscillopsia. He is unsure if it improves/goes away when he covers one eye. These eye movements also come on intermittently and can last seconds to minutes. Denies any triggering or relieving factors. He was prescribed Trileptal 300mg  BID by Dr Sandria Manly for these symptoms. He is unsure if it is giving him any benefit but wishes to remain on it.   These symptoms can be worsened by laying on the left, improved with laying on the right and/or opening his mouth and tilting his head to the right. He has had a brain MRI w/ and w/o contrast done in 2012 which was unremarkable. Has been evaluated by ENT, had normal audiogram, with no diagnosis given.   Concerns/Questions:Review of Systems: Out of a complete 14 system review, the patient complains of only the following symptoms, and all other reviewed systems are negative.  + for blurred vision, double vision and eye pain  History   Social History  . Marital Status: Married    Spouse Name: N/A    Number of Children: 2  . Years of Education: 14   Occupational History  . retired    Social History Main Topics  . Smoking status: Former Smoker    Types: Cigarettes  . Smokeless tobacco: Never Used      Comment: Quit in 1970  . Alcohol Use: Yes     Comment: Social  . Drug Use: Yes  . Sexually Active: Not on file   Other Topics Concern  . Not on file   Social History Narrative   Patient lives at home with his wife . He is retired. Patient has 14 years of education. Patient has two children.   Caffeine - two cups daily.    Family History  Problem Relation Age of Onset  . Dementia Father   . Alzheimer's disease Father   . Heart Problems Mother     Past Medical History  Diagnosis Date  . Hypertension   . CVS disease   . Hyperlipidemia   . Gilbert's disease   . Unspecified septicemia   . 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 Atypical facial pain   . Unspecified tinnitus     right    Past Surgical History  Procedure Laterality Date  . Neck surgery    . Back surgery    . Nasal sinus surgery  08/2011  . Knee surgery Bilateral 2005    replacement,partial knee replacement because of bacterial infection-2012    Current Outpatient Prescriptions  Medication Sig Dispense Refill  . albuterol (PROVENTIL HFA;VENTOLIN HFA) 108 (90 BASE) MCG/ACT inhaler Inhale 2 puffs into the lungs every 6 (six) hours as needed for wheezing.      Marland Kitchen aspirin 81 MG tablet Take  81 mg by mouth daily.      . Aspirin-Acetaminophen-Caffeine (GOODYS EXTRA STRENGTH PO) Take by mouth as needed.      . Garlic 10 MG CAPS Take 1 capsule by mouth daily.      . mometasone-formoterol (DULERA) 100-5 MCG/ACT AERO Take 2 puffs first thing in am and then another 2 puffs about 12 hours later.  1 Inhaler  11  . Multiple Vitamin (MULTIVITAMIN) tablet Take 1 tablet by mouth daily.      . Omega-3 Fatty Acids (FISH OIL) 600 MG CAPS Take by mouth 2 (two) times daily.      . OXcarbazepine (TRILEPTAL) 150 MG tablet Take 300 mg by mouth 2 (two) times daily.      . pravastatin (PRAVACHOL) 80 MG tablet Take 1 tablet (80 mg total) by mouth daily.  30 tablet  1  . ranitidine (ZANTAC) 150 MG capsule Take 150 mg by mouth 2  (two) times daily.      . temazepam (RESTORIL) 15 MG capsule Take 15 mg by mouth at bedtime as needed for sleep.      . verapamil (COVERA HS) 240 MG (CO) 24 hr tablet Take 240 mg by mouth at bedtime.       No current facility-administered medications for this visit.    Allergies as of 07/07/2013 - Review Complete 07/06/2013  Allergen Reaction Noted  . Oxycontin (oxycodone hcl er) Anaphylaxis 06/02/2013  . Ambien (zolpidem tartrate)  07/06/2013  . Percocet (oxycodone-acetaminophen)  07/06/2013    Vitals: There were no vitals taken for this visit. Last Weight:  Wt Readings from Last 1 Encounters:  06/30/13 217 lb (98.431 kg)   Last Height:   Ht Readings from Last 1 Encounters:  06/30/13 6\' 3"  (1.905 m)     Physical exam: Exam: Gen: NAD, conversant Eyes: anicteric sclerae, moist conjunctivae HENT: Atraumati Lungs: CTA, no wheezing, rales, rhonic                          CV: RRR, no MRG Abdomen: Soft, non-tender;  Extremities: No peripheral edema  Skin: Normal temperature, no rash,  Psych: Appropriate affect, pleasant  Neuro: MS: AA&Ox3, appropriately interactive, normal affect   Attention: WORLD backwards  Speech: fluent w/o paraphasic error  Memory: good recent and remote recall  CN: PERRL, EOMI no nystagmus, no ptosis, sensation intact to LT V1-V3 bilat, face symmetric, no weakness, hearing grossly intact, palate elevates symmetrically, shoulder shrug 5/5 bilat,  tongue protrudes midline, no fasiculations noted.  Motor: normal bulk and tone Strength: 5/5  In all extremities  Coord: rapid alternating and point-to-point (FNF, HTS) movements intact.  Reflexes: symmetrical, bilat downgoing toes  Sens: LT intact in all extremities  Gait: posture, stance, stride and arm-swing normal. .   Assessment:  After physical and neurologic examination, review of laboratory studies, imaging, neurophysiology testing and pre-existing records, assessment will be reviewed on  the problem list.  Plan:  Treatment plan and additional workup will be reviewed under Problem List.  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 He had multiple events during the office visit and I was unable to visualize any abnormal eye movements. No nystagmus noted on exam.   Unclear etiology of these symptoms. Per description appears to be oscillopsia but I am unsure of underlying etiology  -will refer patient to [facility] for further workup -offered patient option  of vestibular rehabilitation. He wishes to folllow up in [facility] and see what they suggest -will continue Trileptal at this point as patient wishes to remain on it -follow up as needed  A total of 30 minutes was spent with the patient. Half of this time was spent in face to face discussion of the patients conditions. Specifically we discussed potential diagnosis, the benefits of seeing a sub-specialist and treatment options such as vestibular rehab. All questions were completely answered.

## 2013-08-11 ENCOUNTER — Ambulatory Visit: Payer: Managed Care, Other (non HMO) | Admitting: Internal Medicine

## 2013-08-16 ENCOUNTER — Ambulatory Visit (INDEPENDENT_AMBULATORY_CARE_PROVIDER_SITE_OTHER): Payer: Managed Care, Other (non HMO) | Admitting: Internal Medicine

## 2013-08-16 ENCOUNTER — Encounter: Payer: Self-pay | Admitting: Internal Medicine

## 2013-08-16 VITALS — BP 140/80 | HR 60 | Temp 97.0°F | Ht 75.0 in | Wt 219.0 lb

## 2013-08-16 DIAGNOSIS — J45909 Unspecified asthma, uncomplicated: Secondary | ICD-10-CM

## 2013-08-16 MED ORDER — RANITIDINE HCL 150 MG PO CAPS: 150.0000 mg | ORAL_CAPSULE | Freq: Every evening | ORAL | Status: DC

## 2013-08-16 NOTE — Progress Notes (Signed)
Subjective:    Patient ID: Louis Perez, male    DOB: 11-09-1951  MRN: [id number]    Brief patient profile:  81 yowm smoked only as teenager with chronic nasal congestion x decades worse in winters s/p sinus surgery 2013 then began having cough/ wheeze referred 06/02/2013 to pulmonary clinic by Louis Perez.   HPI 06/02/2013 1st pulmonary eval cc cough starting "shortly after" sinus surgery then breathing problems esp last 6 months assoc with change in voice. Onset of cough and sob was gradual, progressively worse with sense of chest congestion and production of thick green mucus but no vomiting. rec augmentin 875 twice daily x 10 days Prednisone 10 mg take  4 each am x 2 days,   2 each am x 2 days,  1 each am x 2 days and stop  Dulera 200 Take 2 puffs first thing in am and then another 2 puffs about 12 hours later.  Only use your albuterol(ventolin) as a rescue medication  - don't leave home without the rescue inhaler Pantoprazole (protonix) 40 mg   Take 30-60 min before first meal of the day and Pepcid 20 mg one bedtime until return to office - this is the best way to tell whether stomach acid is contributing to your problem.     06/30/2013 f/u ov/Louis Perez re cough Chief Complaint  Patient presents with  . Follow-up    Breathing doing much better, no co's today.  no need at all for saba, not limited by breathing.  Sensation of something stuck in throat comes and goes ever since sinus surgery and worse lately so then went back on protonix rec Stop protonix and just try the rantidine 150 after bfast and be careful with your diet  Reduce dulera to 100  Take 2 puffs first thing in am and then another 2 puffs about 12 hours later   08/16/13 f/u ov / Louis Perez  Chief Complaint  Patient presents with  . Follow-up    Pt states that breathing is doing well. He c/o cough x 2 wks- prod with minimal yellow sputum.     Persistent sense of throat tickle, no need for sab, not disturbing sleep exac in  ams,some worse off ppi  No obvious daytime variabilty or assoc sob  or cp or chest tightness, subjective wheeze overt sinus or hb symptoms. No unusual exp hx or h/o childhood pna/ asthma or knowledge of premature birth.      Also denies any obvious fluctuation of symptoms with weather or environmental changes or other aggravating or alleviating factors except as outlined above    Current Medications, Allergies, Past Medical History, Past Surgical History, Family History, and Social History were reviewed in [employer] record.  ROS  The following are not active complaints unless bolded sore throat, dysphagia, dental problems, itching, sneezing,  nasal congestion or excess/ purulent secretions, ear ache,   fever, chills, sweats, unintended wt loss, pleuritic or exertional cp, hemoptysis,  orthopnea pnd or leg swelling, presyncope, palpitations, heartburn, abdominal pain, anorexia, nausea, vomiting, diarrhea  or change in bowel or urinary habits, change in stools or urine, dysuria,hematuria,  rash, arthralgias, visual complaints, headache, numbness weakness or ataxia or problems with walking or coordination,  change in mood/affect or memory.             Objective:   Physical Exam  Amb mod anxious wm nad with nl vital signs   Wt Readings from Last 3 Encounters:  08/16/13 219 lb (99.338  kg)  07/07/13 215 lb (97.523 kg)  06/30/13 217 lb (98.431 kg)         HEENT: nl dentition, turbinates, and orophanx is pristine. Nl external ear canals without cough reflex   NECK :  without JVD/Nodes/TM/ nl carotid upstrokes bilaterally   LUNGS: no acc muscle use, no wheeze at all, good air movement   CV:  RRR  no s3 or murmur or increase in P2, no edema   ABD:  soft and nontender with nl excursion in the supine position. No bruits or organomegaly, bowel sounds nl  MS:  warm without deformities, calf tenderness, cyanosis or clubbing   Studies reviewed Sinus CT 08/11/11  Pansinus inflammatory changes, maximal in the right frontal and  bilateral ethmoids. The appearance is suggestive of acute  rhinosinusitis. Ethmoid sinonasal polyposis cannot be excluded.   cxr 04/24/13 wnl          Assessment & Plan:

## 2013-08-16 NOTE — Patient Instructions (Addendum)
Pantoprazole (protonix) 40 mg   Take 30-60 min before first meal of the day and Pepcid 20 mg one bedtime until return to office - this is the best way to tell whether stomach acid is contributing to your problem.    Stop dulera 100  to see if your cough or breathing worsen> restart it   GERD (REFLUX)  is an extremely common cause of respiratory symptoms, many times with no significant heartburn at all.    It can be treated with medication, but also with lifestyle changes including avoidance of late meals, excessive alcohol, smoking cessation, and avoid fatty foods, chocolate, peppermint, colas, red wine, and acidic juices such as orange juice.  NO MINT OR MENTHOL PRODUCTS SO NO COUGH DROPS  USE SUGARLESS CANDY INSTEAD (jolley ranchers or Stover's)  NO OIL BASED VITAMINS - use powdered substitutes.     If you are satisfied with your treatment plan let your doctor know and he/she can either refill your medications or you can return here when your prescription runs out.     If in any way you are not 100% satisfied,  please tell us.  If 100% better, tell your friends!

## 2013-08-18 NOTE — Assessment & Plan Note (Signed)
-   hfa 50% 06/02/13 >  90% 07/01/13   Not sure that he has active asthma or to what extent his dulera is contributing to his daytime cough  This could be just  Classic Upper airway cough syndrome, so named because it's frequently impossible to sort out how much is  CR/sinusitis with freq throat clearing (which can be related to primary GERD)   vs  causing  secondary (" extra esophageal")  GERD from wide swings in gastric pressure that occur with throat clearing, often  promoting self use of mint and menthol lozenges that reduce the lower esophageal sphincter tone and exacerbate the problem further in a cyclical fashion.   These are the same pts (now being labeled as having "irritable larynx syndrome" by some cough centers) who not infrequently have a history of having failed to tolerate ace inhibitors,  dry powder inhalers or biphosphonates or report having atypical reflux symptoms that don't respond to standard doses of PPI , and are easily confused as having aecopd or asthma flares by even experienced allergists/ pulmonologists.  Try back on gerd rx and consider ent/ sinus re-eval if exacerbates  His hfa technique is so good and dulera works so quickly that it's ok to try off using the reverse of a therapeutic trial  See instructions for specific recommendations which were reviewed directly with the patient who was given a copy with highlighter outlining the key components.

## 2013-09-20 ENCOUNTER — Encounter: Payer: Self-pay | Admitting: Internal Medicine

## 2013-09-20 ENCOUNTER — Ambulatory Visit (INDEPENDENT_AMBULATORY_CARE_PROVIDER_SITE_OTHER): Payer: Managed Care, Other (non HMO) | Admitting: Internal Medicine

## 2013-09-20 ENCOUNTER — Other Ambulatory Visit (INDEPENDENT_AMBULATORY_CARE_PROVIDER_SITE_OTHER): Payer: Managed Care, Other (non HMO)

## 2013-09-20 VITALS — BP 134/90 | HR 60 | Temp 97.8°F | Ht 75.0 in | Wt 219.0 lb

## 2013-09-20 DIAGNOSIS — J45909 Unspecified asthma, uncomplicated: Secondary | ICD-10-CM

## 2013-09-20 LAB — CBC WITH DIFFERENTIAL/PLATELET
Basophils Absolute: 0 10*3/uL (ref 0.0–0.1)
Eosinophils Absolute: 0.4 10*3/uL (ref 0.0–0.7)
Hemoglobin: 16.3 g/dL (ref 13.0–17.0)
Lymphocytes Relative: 29.1 % (ref 12.0–46.0)
Monocytes Relative: 10.5 % (ref 3.0–12.0)
Neutrophils Relative %: 52.6 % (ref 43.0–77.0)
Platelets: 252 10*3/uL (ref 150.0–400.0)
RDW: 13.8 % (ref 11.5–14.6)
WBC: 5.4 10*3/uL (ref 4.5–10.5)

## 2013-09-20 MED ORDER — AMOXICILLIN-POT CLAVULANATE 875-125 MG PO TABS: 1.0000 | ORAL_TABLET | Freq: Two times a day (BID) | ORAL | Status: DC

## 2013-09-20 MED ORDER — PREDNISONE (PAK) 10 MG PO TABS: ORAL_TABLET | ORAL | Status: DC

## 2013-09-20 NOTE — Assessment & Plan Note (Signed)
-   hfa 50% 06/02/13 >  90% 07/01/13  - allergy profile 09/20/2013 > 7% eos >>>  DDX of  difficult airways managment all start with A and  include Adherence, Ace Inhibitors, Acid Reflux, Active Sinus Disease, Alpha 1 Antitripsin deficiency, Anxiety masquerading as Airways dz,  ABPA,  allergy(esp in young), Aspiration (esp in elderly), Adverse effects of DPI,  Active smokers, plus two Bs  = Bronchiectasis and Beta blocker use..and one C= CHF  Adherence is always the initial "prime suspect" and is a multilayered concern that requires a "trust but verify" approach in every patient - starting with knowing how to use medications, especially inhalers, correctly, keeping up with refills and understanding the fundamental difference between maintenance and prns vs those medications only taken for a very short course and then stopped and not refilled.  - he did not restart dulera immediately on flare of resp symptoms, so for now asked him to use it automatically, not "prn"  ? Acid reflux > less likely a primary concern since flare occurred while still on gerd rx  ? Active sinus dz > may need ent f/u  ? Allergy > suggested by typical seasonal rhinitis flare and elevated eos > check RAST  For now rx with augmentin/ pred then regroup in 4 weeks, sooner if needed

## 2013-09-20 NOTE — Progress Notes (Signed)
Subjective:    Patient ID: Louis Perez, male    DOB: 1951-04-19  MRN: [id number]    Brief patient profile:  35 yowm smoked only as teenager with chronic nasal congestion x decades worse in winters s/p sinus surgery 2013 then began having cough/ wheeze referred 06/02/2013 to pulmonary clinic by Dr Rosezetta Schlatter.   HPI 06/02/2013 1st pulmonary eval cc cough starting "shortly after" sinus surgery then breathing problems esp last 6 months assoc with change in voice. Onset of cough and sob was gradual, progressively worse with sense of chest congestion and production of thick green mucus but no vomiting. rec augmentin 875 twice daily x 10 days Prednisone 10 mg take  4 each am x 2 days,   2 each am x 2 days,  1 each am x 2 days and stop  Dulera 200 Take 2 puffs first thing in am and then another 2 puffs about 12 hours later.  Only use your albuterol(ventolin) as a rescue medication  - don't leave home without the rescue inhaler Pantoprazole (protonix) 40 mg   Take 30-60 min before first meal of the day and Pepcid 20 mg one bedtime until return to office - this is the best way to tell whether stomach acid is contributing to your problem.     06/30/2013 f/u ov/[name] re cough Chief Complaint  Patient presents with  . Follow-up    Breathing doing much better, no co's today.  no need at all for saba, not limited by breathing.  Sensation of something stuck in throat comes and goes ever since sinus surgery and worse lately so then went back on protonix rec Stop protonix and just try the rantidine 150 after bfast and be careful with your diet  Reduce dulera to 100  Take 2 puffs first thing in am and then another 2 puffs about 12 hours later   08/16/13 f/u ov / [name]  Chief Complaint  Patient presents with  . Follow-up    Pt states that breathing is doing well. He c/o cough x 2 wks- prod with minimal yellow sputum.   Persistent sense of throat tickle, no need for sab, not disturbing sleep exac in  ams,some worse off ppi rec Pantoprazole (protonix) 40 mg   Take 30-60 min before first meal of the day and Pepcid 20 mg one bedtime until return to office - this is the best way to tell whether stomach acid is contributing to your problem.   Stop dulera 100  to see if your cough or breathing worsen> restart it  GERD   09/20/2013 f/u ov/[name] re: recurrent cough x 2 weeks on ppi/h2hs  Chief Complaint  Patient presents with  . Acute Visit    Reports increased DOE, wheezing, coughing. Onset was 1 week ago. Feels as if this may be related to his allergies.  did not restart the dulera immediately but sounds like he was compliant with acid, with esp noct wheeze assoc with nasal congestion typical of this time of year   No obvious daytime variabilty or assoc cp or chest tightness,  overt sinus or hb symptoms. No unusual exp hx or h/o childhood pna/ asthma or knowledge of premature birth.      Also denies any obvious fluctuation of symptoms with weather or environmental changes or other aggravating or alleviating factors except as outlined above    Current Medications, Allergies, Past Medical History, Past Surgical History, Family History, and Social History were reviewed in [employer] record.  ROS  The following are not active complaints unless bolded sore throat, dysphagia, dental problems, itching, sneezing,  nasal congestion or excess/ purulent secretions, ear ache,   fever, chills, sweats, unintended wt loss, pleuritic or exertional cp, hemoptysis,  orthopnea pnd or leg swelling, presyncope, palpitations, heartburn, abdominal pain, anorexia, nausea, vomiting, diarrhea  or change in bowel or urinary habits, change in stools or urine, dysuria,hematuria,  rash, arthralgias, visual complaints, headache, numbness weakness or ataxia or problems with walking or coordination,  change in mood/affect or memory.             Objective:   Physical Exam  Amb mod anxious wm nad  with nl vital signs   09/20/2013       219  Wt Readings from Last 3 Encounters:  08/16/13 219 lb (99.338 kg)  07/07/13 215 lb (97.523 kg)  06/30/13 217 lb (98.431 kg)         HEENT: nl dentition, turbinates, and orophanx is pristine. Nl external ear canals without cough reflex   NECK :  without JVD/Nodes/TM/ nl carotid upstrokes bilaterally   LUNGS: no acc muscle use, no wheeze at all, good air movement   CV:  RRR  no s3 or murmur or increase in P2, no edema   ABD:  soft and nontender with nl excursion in the supine position. No bruits or organomegaly, bowel sounds nl  MS:  warm without deformities, calf tenderness, cyanosis or clubbing   Studies reviewed Sinus CT 08/11/11 Pansinus inflammatory changes, maximal in the right frontal and  bilateral ethmoids. The appearance is suggestive of acute  rhinosinusitis. Ethmoid sinonasal polyposis cannot be excluded.   cxr 04/24/13 wnl          Assessment & Plan:

## 2013-09-20 NOTE — Patient Instructions (Addendum)
Continue dulera 100 Take 2 puffs first thing in am and then another 2 puffs about 12 hours later and out through nose   Also continue the acid suppression    Only use your albuterol as a rescue medication to be used if you can't catch your breath by resting or doing a relaxed purse lip breathing pattern.  - The less you use it, the better it will work when you need it. - Ok to use up to every 4 hours if you must but call for immediate appointment if use goes up over your usual need - Don't leave home without it !!  (think of it like your spare tire for your car)   Prednisone 10 mg take  4 each am x 2 days,   2 each am x 2 days,  1 each am x 2 days and stop   augmentin 875 twice daily x 10 days   Please schedule a follow up office visit in 4 weeks, sooner if needed bring all active medication

## 2013-09-21 LAB — ALLERGY PROFILE REGION II-DC, DE, MD, ~~LOC~~, VA
Aspergillus fumigatus, m3: <0.1 kU/L
Bermuda Grass: <0.1 kU/L
Cat Dander: <0.1 kU/L
Cladosporium Herbarum: <0.1 kU/L
Common Ragweed: <0.1 kU/L
Elm IgE: <0.1 kU/L
IgE (Immunoglobulin E), Serum: 31.2 IU/mL (ref 0.0–180.0)
Johnson Grass: <0.1 kU/L
Lamb's Quarters: <0.1 kU/L
Meadow Grass: <0.1 kU/L
Pecan/Hickory Tree IgE: <0.1 kU/L

## 2013-09-22 ENCOUNTER — Encounter: Payer: Self-pay | Admitting: Internal Medicine

## 2013-09-28 ENCOUNTER — Encounter: Payer: Self-pay | Admitting: Internal Medicine

## 2013-09-28 ENCOUNTER — Ambulatory Visit (INDEPENDENT_AMBULATORY_CARE_PROVIDER_SITE_OTHER): Payer: Managed Care, Other (non HMO) | Admitting: Internal Medicine

## 2013-09-28 VITALS — BP 130/90 | HR 64 | Ht 75.0 in | Wt 218.2 lb

## 2013-09-28 DIAGNOSIS — K219 Gastro-esophageal reflux disease without esophagitis: Secondary | ICD-10-CM | POA: Insufficient documentation

## 2013-09-28 DIAGNOSIS — E785 Hyperlipidemia, unspecified: Secondary | ICD-10-CM

## 2013-09-28 DIAGNOSIS — I10 Essential (primary) hypertension: Secondary | ICD-10-CM

## 2013-09-28 MED ORDER — PRAVASTATIN SODIUM 80 MG PO TABS: 80.0000 mg | ORAL_TABLET | Freq: Every evening | ORAL | Status: DC

## 2013-09-28 NOTE — Progress Notes (Signed)
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  He said y Chow last year in consultation for evaluation of substernal chest discomfort and interscapular discomfort. It turned That this is more likely acid reflux and he improved on protonic. He has stress test which was negative for ischemia at a 2-D echo which showed mild concentric LVH borderline diastolic dysfunction and no significant valvular disease. Recently he's had some shortness of breath, wheezing and cough and was diagnosed by Louis Perez with asthma.  He has been treated with antibiotics, steroids and a steroid/long-acting bronchodilator inhaler.  He does report his symptoms are improving. His reflexes been well-controlled. He also has dyslipidemia and a family history of heart disease. He was previously taking Pravachol but recently ran out of that medication and as in need of a refill.  Other than that he remains asymptomatic.  PMHx:  Past Medical History  Diagnosis Date  . Hypertension   . CVS disease   . Hyperlipidemia   . Gilbert's disease   . Unspecified septicemia   . 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 Atypical facial pain   . Unspecified tinnitus     right    Past Surgical History  Procedure Laterality Date  . Neck surgery    . Back surgery    . Nasal sinus surgery  08/2011  . Knee surgery Bilateral 2005    replacement,partial knee replacement because of bacterial infection-2012  . Cardiovascular stress test  05/26/2012    EKG negative for ischemia, no ECG changes, no significant ischemia noted  . Transthoracic echocardiogram  05/26/2012    EF >55%, mild concentric LVH    FAMHx:  Family History  Problem Relation Age of Onset  . Dementia Father   . Alzheimer's disease Father   . Heart disease Mother     Onset-Late 32s  . Heart disease  Brother 21    SOCHx:   reports that he quit smoking about 44 years ago. His smoking use included Cigarettes. He smoked 0.00 packs per day for 3 years. He has never used smokeless tobacco. He reports that he drinks alcohol. He reports that he does not use illicit drugs.  ALLERGIES:  Allergies  Allergen Reactions  . Oxycontin [Oxycodone Hcl] Anaphylaxis  . Ambien [Zolpidem Tartrate]   . Percocet [Oxycodone-Acetaminophen]     ROS: A comprehensive review of systems was negative except for: Respiratory: positive for asthma and cough  HOME MEDS: Current Outpatient Prescriptions  Medication Sig Dispense Refill  . albuterol (PROVENTIL HFA;VENTOLIN HFA) 108 (90 BASE) MCG/ACT inhaler Inhale 2 puffs into the lungs every 6 (six) hours as needed for wheezing.      Marland Kitchen amoxicillin-clavulanate (AUGMENTIN) 875-125 MG per tablet Take 1 tablet by mouth 2 (two) times daily.  20 tablet  0  . aspirin 81 MG tablet Take 81 mg by mouth daily.      . Aspirin-Acetaminophen-Caffeine (GOODYS EXTRA STRENGTH PO) Take by mouth as needed.      . cetirizine (ZYRTEC) 10 MG tablet Take 10 mg by mouth daily.      . Garlic 1000 MG CAPS Take by mouth daily.      . Melatonin 5 MG CAPS Take by mouth as directed.      . mometasone-formoterol (DULERA) 100-5 MCG/ACT AERO Take 2 puffs first thing in am  and then another 2 puffs about 12 hours later.  1 Inhaler  11  . Multiple Vitamin (MULTIVITAMIN) tablet Take 1 tablet by mouth daily.      . naproxen sodium (ANAPROX) 220 MG tablet Take 220 mg by mouth as needed.      . pantoprazole (PROTONIX) 40 MG tablet Take 40 mg by mouth daily.      . ranitidine (ZANTAC) 150 MG capsule Take 1 capsule (150 mg total) by mouth every evening.  90 capsule  3  . verapamil (COVERA HS) 240 MG (CO) 24 hr tablet Take 240 mg by mouth at bedtime.      . pravastatin (PRAVACHOL) 80 MG tablet Take 1 tablet (80 mg total) by mouth every evening.  90 tablet  3   No current facility-administered medications  for this visit.    LABS/IMAGING: No results found for this or any previous visit (from the past 48 hour(s)). No results found.  VITALS: BP 130/90  Pulse 64  Ht 6\' 3"  (1.905 m)  Wt 218 lb 3.2 oz (98.975 kg)  BMI 27.27 kg/m2  EXAM: General appearance: alert and no distress Neck: no carotid bruit and no JVD Lungs: clear to auscultation bilaterally Heart: regular rate and rhythm, S1, S2 normal, no murmur, click, rub or gallop Abdomen: soft, non-tender; bowel sounds normal; no masses,  no organomegaly Extremities: extremities normal, atraumatic, no cyanosis or edema Pulses: 2+ and symmetric Skin: Skin color, texture, turgor normal. No rashes or lesions Neurologic: Grossly normal Psych: Mood, affect normal  EKG: Normal sinus rhythm at 64  ASSESSMENT: 1. Hypertension-at goal 2. Dyslipidemia-will need a refill of pravastatin 3. Asthma-on treatment by Louis Perez  PLAN: 1.   Louis Perez is doing well and will need to restart his cholesterol medication.  Since he's been off of it for 1 month, I would recommend a repeat lipid profile in 2-3 months. He is scheduled to see his primary care provider that can be performed in his office. I plan to see him back in a year and make no other changes to his medicines at this time.  Louis Nose, MD, [facility] Attending Cardiologist [facility]  Louis Perez 09/28/2013, 5:35 PM

## 2013-09-28 NOTE — Patient Instructions (Signed)
Your physician wants you to follow-up in: 1 year. You will receive a reminder letter in the mail two months in advance. If you don't receive a letter, please call our office to schedule the follow-up appointment.  Please call our office if you don't have a physical/blood work in a month or so - Dr. Rennis Golden may want to check your cholesterol.

## 2013-10-03 ENCOUNTER — Other Ambulatory Visit: Payer: Self-pay | Admitting: *Deleted

## 2013-10-03 MED ORDER — MOMETASONE FURO-FORMOTEROL FUM 100-5 MCG/ACT IN AERO: INHALATION_SPRAY | RESPIRATORY_TRACT | Status: DC

## 2013-10-06 ENCOUNTER — Other Ambulatory Visit: Payer: Self-pay

## 2013-10-06 MED ORDER — PANTOPRAZOLE SODIUM 40 MG PO TBEC: 40.0000 mg | DELAYED_RELEASE_TABLET | Freq: Every day | ORAL | Status: DC

## 2013-10-06 NOTE — Telephone Encounter (Signed)
Rx was sent to pharmacy electronically. 

## 2013-10-18 ENCOUNTER — Ambulatory Visit (INDEPENDENT_AMBULATORY_CARE_PROVIDER_SITE_OTHER): Payer: Managed Care, Other (non HMO) | Admitting: Internal Medicine

## 2013-10-18 ENCOUNTER — Encounter: Payer: Self-pay | Admitting: Internal Medicine

## 2013-10-18 VITALS — BP 140/92 | HR 88 | Temp 97.9°F | Ht 75.0 in | Wt 217.0 lb

## 2013-10-18 DIAGNOSIS — J45909 Unspecified asthma, uncomplicated: Secondary | ICD-10-CM

## 2013-10-18 MED ORDER — MOMETASONE FURO-FORMOTEROL FUM 200-5 MCG/ACT IN AERO: INHALATION_SPRAY | RESPIRATORY_TRACT | Status: DC

## 2013-10-18 NOTE — Patient Instructions (Addendum)
dulera 200 mcg Take 2 puffs first thing in am and then another 2 puffs about 12 hours later.   Only use your albuterol as a rescue medication to be used if you can't catch your breath by resting or doing a relaxed purse lip breathing pattern.  - The less you use it, the better it will work when you need it. - Ok to use up to every 4 hours if you must but call for immediate appointment if use goes up over your usual need - Don't leave home without it !!  (think of it like your spare tire for your car)   Try taper off the am acid protonix(pantaprazole) to see if any daytime symptoms flare but continue the bedtime pepcid until return  See Dr Brion Aliment re your green post-nasal drainage  Please schedule a follow up visit in 3 months but call sooner if needed

## 2013-10-18 NOTE — Progress Notes (Signed)
Subjective:    Patient ID: Louis Perez, male    DOB: March 17, 1951  MRN: [id number]    Brief patient profile:  78 yowm smoked only as teenager with chronic nasal congestion x decades worse in winters s/p sinus surgery 2013 then began having cough/ wheeze referred 06/02/2013 to pulmonary clinic by Dr Rosezetta Schlatter.   HPI 06/02/2013 1st pulmonary eval cc cough starting "shortly after" sinus surgery then breathing problems esp last 6 months assoc with change in voice. Onset of cough and sob was gradual, progressively worse with sense of chest congestion and production of thick green mucus but no vomiting. rec augmentin 875 twice daily x 10 days Prednisone 10 mg take  4 each am x 2 days,   2 each am x 2 days,  1 each am x 2 days and stop  Dulera 200 Take 2 puffs first thing in am and then another 2 puffs about 12 hours later.  Only use your albuterol(ventolin) as a rescue medication  - don't leave home without the rescue inhaler Pantoprazole (protonix) 40 mg   Take 30-60 min before first meal of the day and Pepcid 20 mg one bedtime until return to office - this is the best way to tell whether stomach acid is contributing to your problem.     06/30/2013 f/u ov/Leyana Whidden re cough Chief Complaint  Patient presents with  . Follow-up    Breathing doing much better, no co's today.  no need at all for saba, not limited by breathing.  Sensation of something stuck in throat comes and goes ever since sinus surgery and worse lately so then went back on protonix rec Stop protonix and just try the rantidine 150 after bfast and be careful with your diet  Reduce dulera to 100  Take 2 puffs first thing in am and then another 2 puffs about 12 hours later   08/16/13 f/u ov / Khyren Hing  Chief Complaint  Patient presents with  . Follow-up    Pt states that breathing is doing well. He c/o cough x 2 wks- prod with minimal yellow sputum.   Persistent sense of throat tickle, no need for sab, not disturbing sleep exac in  ams,some worse off ppi rec Pantoprazole (protonix) 40 mg   Take 30-60 min before first meal of the day and Pepcid 20 mg one bedtime until return to office - this is the best way to tell whether stomach acid is contributing to your problem.   Stop dulera 100  to see if your cough or breathing worsen> restart it  GERD   09/20/2013 f/u ov/Yesenia Locurto re: recurrent cough x 2 weeks on ppi/h2hs  Chief Complaint  Patient presents with  . Acute Visit    Reports increased DOE, wheezing, coughing. Onset was 1 week ago. Feels as if this may be related to his allergies.  did not restart the dulera immediately but sounds like he was compliant with acid, with esp noct wheeze assoc with nasal congestion typical of this time of year rec Continue dulera 100 Take 2 puffs first thing in am and then another 2 puffs about 12 hours later and out through nose  Also continue the acid suppression  Only use your albuterol   Prednisone 10 mg take  4 each am x 2 days,   2 each am x 2 days,  1 each am x 2 days and stop  Augmentin 875 twice daily x 10 days  Please schedule a follow up office visit in 4 weeks,  sooner if needed bring all active medication   10/18/2013 f/u ov/Adiyah Lame re: asthma/ cough did not bring meds  Chief Complaint  Patient presents with  . Follow-up    Pt states that his cough, dyspnea and wheezing are slightly improved since last visit. He still notices wheezing at night. Cough is prod with moderate yellow to green sputum.    not feeling the need to use cough suppression at this point, rare need for saba but felt the best when on dulera 200 2bid    No obvious daytime variabilty or assoc cp or chest tightness,  overt sinus or hb symptoms. No unusual exp hx or h/o childhood pna/ asthma or knowledge of premature birth.      Also denies any obvious fluctuation of symptoms with weather or environmental changes or other aggravating or alleviating factors except as outlined above    Current Medications,  Allergies, Past Medical History, Past Surgical History, Family History, and Social History were reviewed in [employer] record.  ROS  The following are not active complaints unless bolded sore throat, dysphagia, dental problems, itching, sneezing,  nasal congestion or excess/ purulent secretions, ear ache,   fever, chills, sweats, unintended wt loss, pleuritic or exertional cp, hemoptysis,  orthopnea pnd or leg swelling, presyncope, palpitations, heartburn, abdominal pain, anorexia, nausea, vomiting, diarrhea  or change in bowel or urinary habits, change in stools or urine, dysuria,hematuria,  rash, arthralgias, visual complaints, headache, numbness weakness or ataxia or problems with walking or coordination,  change in mood/affect or memory.             Objective:   Physical Exam  Amb mod anxious wm nad with nl vital signs   09/20/2013       219 > 217 10/18/2013  Wt Readings from Last 3 Encounters:  08/16/13 219 lb (99.338 kg)  07/07/13 215 lb (97.523 kg)  06/30/13 217 lb (98.431 kg)         HEENT: nl dentition, turbinates, and orophanx with green post nasal drainage Nl external ear canals without cough reflex   NECK :  without JVD/Nodes/TM/ nl carotid upstrokes bilaterally   LUNGS: no acc muscle use, no wheeze at all, good air movement   CV:  RRR  no s3 or murmur or increase in P2, no edema   ABD:  soft and nontender with nl excursion in the supine position. No bruits or organomegaly, bowel sounds nl  MS:  warm without deformities, calf tenderness, cyanosis or clubbing   Studies reviewed Sinus CT 08/11/11 Pansinus inflammatory changes, maximal in the right frontal and  bilateral ethmoids. The appearance is suggestive of acute  rhinosinusitis. Ethmoid sinonasal polyposis cannot be excluded.   cxr 04/24/13 wnl          Assessment & Plan:

## 2013-10-18 NOTE — Assessment & Plan Note (Signed)
-   hfa 50% 06/02/13 >  90%  10/18/2013  - allergy profile 09/20/2013 > 7% eos >>> IgE 31.2 and no resp allergens identified  Very difficult to tease out what's asthma vs UACS and looks like asthma but clearly he has a major ongoing risk factor for UACS with ongoing green drainage p course of augmentin so referred back to Dr Brion Aliment  In addition, rec rechallenge with dulera 200 2bid and try taper off the ppi    Each maintenance medication was reviewed in detail including most importantly the difference between maintenance and as needed and under what circumstances the prns are to be used.  Please see instructions for details which were reviewed in writing and the patient given a copy.

## 2015-01-14 HISTORY — PX: SHOULDER ARTHROSCOPY W/ ROTATOR CUFF REPAIR: SHX2400

## 2015-08-22 ENCOUNTER — Ambulatory Visit (INDEPENDENT_AMBULATORY_CARE_PROVIDER_SITE_OTHER): Payer: PRIVATE HEALTH INSURANCE | Admitting: Internal Medicine

## 2015-08-22 ENCOUNTER — Encounter: Payer: Self-pay | Admitting: Internal Medicine

## 2015-08-22 VITALS — BP 136/80 | HR 65 | Ht 75.0 in | Wt 221.0 lb

## 2015-08-22 DIAGNOSIS — J4541 Moderate persistent asthma with (acute) exacerbation: Secondary | ICD-10-CM | POA: Diagnosis not present

## 2015-08-22 MED ORDER — AMOXICILLIN-POT CLAVULANATE 875-125 MG PO TABS: 1.0000 | ORAL_TABLET | Freq: Two times a day (BID) | ORAL | Status: DC

## 2015-08-22 MED ORDER — PREDNISONE 10 MG PO TABS
ORAL_TABLET | ORAL | Status: DC
Start: 2015-08-22 — End: 2016-02-29

## 2015-08-22 MED ORDER — ALBUTEROL SULFATE HFA 108 (90 BASE) MCG/ACT IN AERS: INHALATION_SPRAY | RESPIRATORY_TRACT | Status: DC

## 2015-08-22 NOTE — Patient Instructions (Addendum)
Dulera 200 mcg Take 2 puffs first thing in am and then another 2 puffs about 12 hours later.   Only use your albuterol as a rescue medication to be used if you can't catch your breath by resting or doing a relaxed purse lip breathing pattern.  - The less you use it, the better it will work when you need it. - Ok to use up to every 4 hours if you must but call for immediate appointment if use goes up over your usual need - Don't leave home without it !!  (think of it like your spare tire for your car)   Try taper off the am acid protonix(pantaprazole) to see if any daytime symptoms flare but continue the bedtime pepcid until return  Augmentin 875 mg take one pill twice daily  X 10 days - take at breakfast and supper with large glass of water.  It would help reduce the usual side effects (diarrhea and yeast infections) if you ate cultured yogurt at lunch.   Prednisone 10 mg take  4 each am x 2 days,   2 each am x 2 days,  1 each am x 2 days and stop     If you are satisfied with your treatment plan,  let your doctor know and he/she can either refill your medications or you can return here when your prescription runs out.     If in any way you are not 100% satisfied,  please tell us.  If 100% better, tell your friends!  Pulmonary follow up is as needed

## 2015-08-22 NOTE — Assessment & Plan Note (Signed)
-   allergy profile 09/20/2013 > 7% eos >>> IgE 31.2 and no resp allergens identified - 08/22/2015 p extensive coaching HFA effectiveness =   90% from a baseline of 75%   DDX of  difficult airways management all start with A and  include Adherence, Ace Inhibitors, Acid Reflux, Active Sinus Disease, Alpha 1 Antitripsin deficiency, Anxiety masquerading as Airways dz,  ABPA,  allergy(esp in young), Aspiration (esp in elderly), Adverse effects of meds,  Active smokers, A bunch of PE's (a small clot burden can't cause this syndrome unless there is already severe underlying pulm or vascular dz with poor reserve) plus two Bs  = Bronchiectasis and Beta blocker use..and one C= CHF   Adherence is always the initial "prime suspect" and is a multilayered concern that requires a "trust but verify" approach in every patient - starting with knowing how to use medications, especially inhalers, correctly, keeping up with refills and understanding the fundamental difference between maintenance and prns vs those medications only taken for a very short course and then stopped and not refilled.  -The proper method of use, as well as anticipated side effects, of a metered-dose inhaler are discussed and demonstrated to the patient. Improved effectiveness after extensive coaching during this visit to a level of approximately  90% - explained difference between one bid and 2 bid dosing   ? Allergy > Prednisone 10 mg take  4 each am x 2 days,   2 each am x 2 days,  1 each am x 2 days and stop  ? Acute/ active sinus dz > augmentin x 10 days then sinus ct   I had an extended discussion with the patient reviewing all relevant studies completed to date and  lasting 15 to 20 minutes of a 25 minute visit    Each maintenance medication was reviewed in detail including most importantly the difference between maintenance and prns and under what circumstances the prns are to be triggered using an action plan format that is not reflected in  the computer generated alphabetically organized AVS.    Please see instructions for details which were reviewed in writing and the patient given a copy highlighting the part that I personally wrote and discussed at today's ov.

## 2015-08-22 NOTE — Progress Notes (Signed)
Subjective:    Patient ID: Louis Perez, male    DOB: May 09, 1951  MRN: [id number]    Brief patient profile:  36 yowm smoked only as teenager with chronic nasal congestion x decades worse in win[place] s/p sinus surgery 2013 then began having cough/ wheeze referred 06/02/2013 to pulmonary clinic by Dr Rosezetta Schlatter.   HPI 06/02/2013 1st pulmonary eval cc cough starting "shortly after" sinus surgery then breathing problems esp last 6 months assoc with change in voice. Onset of cough and sob was gradual, progressively worse with sense of chest congestion and production of thick green mucus but no vomiting. rec augmentin 875 twice daily x 10 days Prednisone 10 mg take  4 each am x 2 days,   2 each am x 2 days,  1 each am x 2 days and stop  Dulera 200 Take 2 puffs first thing in am and then another 2 puffs about 12 hours later.  Only use your albuterol(ventolin) as a rescue medication  - don't leave home without the rescue inhaler Pantoprazole (protonix) 40 mg   Take 30-60 min before first meal of the day and Pepcid 20 mg one bedtime until return to office - this is the best way to tell whether stomach acid is contributing to your problem.     06/30/2013 f/u ov/Louis Perez re cough Chief Complaint  Patient presents with  . Follow-up    Breathing doing much better, no co's today.  no need at all for saba, not limited by breathing.  Sensation of something stuck in throat comes and goes ever since sinus surgery and worse lately so then went back on protonix rec Stop protonix and just try the rantidine 150 after bfast and be careful with your diet  Reduce dulera to 100  Take 2 puffs first thing in am and then another 2 puffs about 12 hours later   08/16/13 f/u ov / Louis Perez  Chief Complaint  Patient presents with  . Follow-up    Pt states that breathing is doing well. He c/o cough x 2 wks- prod with minimal yellow sputum.   Persistent sense of throat tickle, no need for sab, not disturbing sleep exac in  ams,some worse off ppi rec Pantoprazole (protonix) 40 mg   Take 30-60 min before first meal of the day and Pepcid 20 mg one bedtime until return to office - this is the best way to tell whether stomach acid is contributing to your problem.   Stop dulera 100  to see if your cough or breathing worsen> restart it  GERD rx   09/20/2013 f/u ov/Louis Perez re: recurrent cough x 2 weeks on ppi/h2hs  Chief Complaint  Patient presents with  . Acute Visit    Reports increased DOE, wheezing, coughing. Onset was 1 week ago. Feels as if this may be related to his allergies.  did not restart the dulera immediately but sounds like he was compliant with acid, with esp noct wheeze assoc with nasal congestion typical of this time of year rec Continue dulera 100 Take 2 puffs first thing in am and then another 2 puffs about 12 hours later and out through nose  Also continue the acid suppression  Only use your albuterol   Prednisone 10 mg take  4 each am x 2 days,   2 each am x 2 days,  1 each am x 2 days and stop  Augmentin 875 twice daily x 10 days  Please schedule a follow up office visit in 4  weeks, sooner if needed bring all active medication   10/18/2013 f/u ov/Louis Perez re: asthma/ cough did not bring meds  Chief Complaint  Patient presents with  . Follow-up    Pt states that his cough, dyspnea and wheezing are slightly improved since last visit. He still notices wheezing at night. Cough is prod with moderate yellow to green sputum.    not feeling the need to use cough suppression at this point, rare need for saba but felt the best when on dulera 200 2bid  rec dulera 200 mcg Take 2 puffs first thing in am and then another 2 puffs about 12 hours later.  Only use your albuterol as a rescue medication Try taper off the am acid protonix(pantaprazole) to see if any daytime symptoms flare but continue the bedtime pepcid until return See Dr Brion Aliment re your green post-nasal drainage> did not do   08/22/2015 acute   ov/Louis Perez re: poorly controlled asthma  Chief Complaint  Patient presents with  . Acute Visit    Pt c/o wheezing, esp at night for the past 3 months. He also has had increased SOB and prod cough with yellow to green sputum.     confused re dose of dulera actually just on one bid dose, much worse x 3 month assoc with nasal congestion and doe as well as increase saba need    No obvious daytime variabilty or assoc cp or chest tightness,  overt  hb symptoms. No unusual exp hx or h/o childhood pna/ asthma or knowledge of premature birth.    Also denies any obvious fluctuation of symptoms with weather or environmental changes or other aggravating or alleviating factors except as outlined above    Current Medications, Allergies, Past Medical History, Past Surgical History, Family History, and Social History were reviewed in [employer] record.  ROS  The following are not active complaints unless bolded sore throat, dysphagia, dental problems, itching, sneezing,  nasal congestion or excess/ purulent secretions, ear ache,   fever, chills, sweats, unintended wt loss, pleuritic or exertional cp, hemoptysis,  orthopnea pnd or leg swelling, presyncope, palpitations, heartburn, abdominal pain, anorexia, nausea, vomiting, diarrhea  or change in bowel or urinary habits, change in stools or urine, dysuria,hematuria,  rash, arthralgias, visual complaints, headache, numbness weakness or ataxia or problems with walking or coordination,  change in mood/affect or memory.             Objective:   Physical Exam  Amb   wm nad   09/20/2013       219 > 217 10/18/2013  > 08/22/2015  221  Wt Readings from Last 3 Encounters:  08/16/13 219 lb (99.338 kg)  07/07/13 215 lb (97.523 kg)  06/30/13 217 lb (98.431 kg)         HEENT: nl dentition, turbinates, and oropharynx   Nl external ear canals without cough reflex   NECK :  without JVD/Nodes/TM/ nl carotid upstrokes bilaterally   LUNGS:  no acc muscle use, insp and exp rhonchi bilaterally   CV:  RRR  no s3 or murmur or increase in P2, no edema   ABD:  soft and nontender with nl excursion in the supine position. No bruits or organomegaly, bowel sounds nl  MS:  warm without deformities, calf tenderness, cyanosis or clubbing          Assessment & Plan:

## 2016-02-12 DIAGNOSIS — I214 Non-ST elevation (NSTEMI) myocardial infarction: Secondary | ICD-10-CM

## 2016-02-12 HISTORY — DX: Non-ST elevation (NSTEMI) myocardial infarction: I21.4

## 2016-02-27 DIAGNOSIS — R079 Chest pain, unspecified: Secondary | ICD-10-CM | POA: Diagnosis not present

## 2016-02-27 DIAGNOSIS — R51 Headache: Secondary | ICD-10-CM | POA: Diagnosis not present

## 2016-02-28 DIAGNOSIS — R361 Hematospermia: Secondary | ICD-10-CM | POA: Insufficient documentation

## 2016-02-28 DIAGNOSIS — K219 Gastro-esophageal reflux disease without esophagitis: Secondary | ICD-10-CM | POA: Diagnosis not present

## 2016-02-28 DIAGNOSIS — D751 Secondary polycythemia: Secondary | ICD-10-CM | POA: Insufficient documentation

## 2016-02-28 DIAGNOSIS — F5102 Adjustment insomnia: Secondary | ICD-10-CM | POA: Insufficient documentation

## 2016-02-29 ENCOUNTER — Encounter (HOSPITAL_COMMUNITY): Payer: Self-pay | Admitting: Emergency Medicine

## 2016-02-29 ENCOUNTER — Emergency Department (HOSPITAL_COMMUNITY): Payer: Medicare Other

## 2016-02-29 ENCOUNTER — Inpatient Hospital Stay (HOSPITAL_COMMUNITY)
Admission: EM | Admit: 2016-02-29 | Discharge: 2016-03-03 | DRG: 247 | Disposition: A | Discharge status: Home / Self Care | Payer: Medicare Other | Attending: Internal Medicine | Admitting: Internal Medicine

## 2016-02-29 DIAGNOSIS — I249 Acute ischemic heart disease, unspecified: Secondary | ICD-10-CM | POA: Diagnosis present

## 2016-02-29 DIAGNOSIS — R079 Chest pain, unspecified: Secondary | ICD-10-CM | POA: Diagnosis not present

## 2016-02-29 DIAGNOSIS — K219 Gastro-esophageal reflux disease without esophagitis: Secondary | ICD-10-CM | POA: Diagnosis present

## 2016-02-29 DIAGNOSIS — Z7982 Long term (current) use of aspirin: Secondary | ICD-10-CM | POA: Diagnosis not present

## 2016-02-29 DIAGNOSIS — R001 Bradycardia, unspecified: Secondary | ICD-10-CM | POA: Diagnosis not present

## 2016-02-29 DIAGNOSIS — E785 Hyperlipidemia, unspecified: Secondary | ICD-10-CM | POA: Diagnosis present

## 2016-02-29 DIAGNOSIS — I214 Non-ST elevation (NSTEMI) myocardial infarction: Principal | ICD-10-CM | POA: Diagnosis present

## 2016-02-29 DIAGNOSIS — I200 Unstable angina: Secondary | ICD-10-CM

## 2016-02-29 DIAGNOSIS — Z79899 Other long term (current) drug therapy: Secondary | ICD-10-CM | POA: Diagnosis not present

## 2016-02-29 DIAGNOSIS — Z87891 Personal history of nicotine dependence: Secondary | ICD-10-CM

## 2016-02-29 DIAGNOSIS — J45909 Unspecified asthma, uncomplicated: Secondary | ICD-10-CM | POA: Diagnosis present

## 2016-02-29 DIAGNOSIS — I2510 Atherosclerotic heart disease of native coronary artery without angina pectoris: Secondary | ICD-10-CM | POA: Diagnosis not present

## 2016-02-29 DIAGNOSIS — I10 Essential (primary) hypertension: Secondary | ICD-10-CM | POA: Diagnosis not present

## 2016-02-29 DIAGNOSIS — Z955 Presence of coronary angioplasty implant and graft: Secondary | ICD-10-CM

## 2016-02-29 DIAGNOSIS — Z7951 Long term (current) use of inhaled steroids: Secondary | ICD-10-CM

## 2016-02-29 HISTORY — DX: Unspecified asthma, uncomplicated: J45.909

## 2016-02-29 LAB — CBC
HCT: 47.4 % (ref 39.0–52.0)
Hemoglobin: 16.4 g/dL (ref 13.0–17.0)
MCH: 31.2 pg (ref 26.0–34.0)
MCHC: 34.6 g/dL (ref 30.0–36.0)
MCV: 90.3 fL (ref 78.0–100.0)
PLATELETS: 219 10*3/uL (ref 150–400)
RBC: 5.25 MIL/uL (ref 4.22–5.81)
RDW: 13.6 % (ref 11.5–15.5)
WBC: 5.5 10*3/uL (ref 4.0–10.5)

## 2016-02-29 LAB — I-STAT TROPONIN, ED: TROPONIN I, POC: 0.15 ng/mL — AB (ref 0.00–0.08)

## 2016-02-29 LAB — BASIC METABOLIC PANEL
Anion gap: 15 (ref 5–15)
BUN: 11 mg/dL (ref 6–20)
CALCIUM: 9.8 mg/dL (ref 8.9–10.3)
CHLORIDE: 102 mmol/L (ref 101–111)
CO2: 27 mmol/L (ref 22–32)
CREATININE: 0.81 mg/dL (ref 0.61–1.24)
GFR calc non Af Amer: >60 mL/min (ref >60)
Glucose, Bld: 122 mg/dL — ABNORMAL HIGH (ref 65–99)
Potassium: 3.4 mmol/L — ABNORMAL LOW (ref 3.5–5.1)
Sodium: 144 mmol/L (ref 135–145)

## 2016-02-29 MED ORDER — HEPARIN (PORCINE) IN NACL 100-0.45 UNIT/ML-% IJ SOLN
1150.0000 [IU]/h | INTRAMUSCULAR | Status: DC
  Administered 2016-02-29: 1200 [IU]/h via INTRAVENOUS
  Administered 2016-03-01: 1150 [IU]/h via INTRAVENOUS
  Filled 2016-02-29 (×2): qty 250

## 2016-02-29 MED ORDER — HEPARIN BOLUS VIA INFUSION
4000.0000 [IU] | Freq: Once | INTRAVENOUS | Status: AC
  Administered 2016-02-29: 4000 [IU] via INTRAVENOUS
  Filled 2016-02-29: qty 4000

## 2016-02-29 MED ORDER — SODIUM CHLORIDE 0.9 % IV SOLN
Freq: Once | INTRAVENOUS | Status: AC
  Administered 2016-02-29: 23:00:00 via INTRAVENOUS

## 2016-02-29 MED ORDER — MORPHINE SULFATE (PF) 4 MG/ML IV SOLN
4.0000 mg | Freq: Once | INTRAVENOUS | Status: AC
  Administered 2016-02-29: 4 mg via INTRAVENOUS
  Filled 2016-02-29: qty 1

## 2016-02-29 MED ORDER — NITROGLYCERIN 0.4 MG SL SUBL
0.4000 mg | SUBLINGUAL_TABLET | SUBLINGUAL | Status: AC | PRN
  Administered 2016-02-29 (×3): 0.4 mg via SUBLINGUAL
  Filled 2016-02-29: qty 1

## 2016-02-29 NOTE — ED Notes (Signed)
Taken to xray at this time. 

## 2016-02-29 NOTE — Progress Notes (Addendum)
ANTICOAGULATION CONSULT NOTE - Initial Consult  Pharmacy Consult for Heparin  Indication: chest pain/ACS  Allergies  Allergen Reactions  . Oxycontin [Oxycodone Hcl] Anaphylaxis  . Percocet [Oxycodone-Acetaminophen] Anaphylaxis  . Ambien [Zolpidem Tartrate] Other (See Comments)    Mood changes    Patient Measurements: Height: 6\' 3"  (190.5 cm) Weight: 210 lb (95.255 kg) IBW/kg (Calculated) : 84.5  Vital Signs: Temp: 97.6 F (36.4 C) (03/18 2121) Temp Source: Oral (03/18 2121) BP: 149/99 mmHg (03/18 2200) Pulse Rate: 65 (03/18 2200)  Labs:  Recent Labs  02/29/16 2140  HGB 16.4  HCT 47.4  PLT 219  CREATININE 0.81    Estimated Creatinine Clearance: 108.7 mL/min (by C-G formula based on Cr of 0.81).   Medical History: Past Medical History  Diagnosis Date  . Hypertension   . CVS disease   . Hyperlipidemia   . Gilbert's disease   . Unspecified septicemia   . 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 Atypical facial pain   . Unspecified tinnitus     right  . Asthma    Assessment: 65 y/o M with chest pressure, starting heparin per pharmacy, no anti-coagulation PTA, POC troponin mildly elevated, CBC good, renal function good, other labs as above.   Goal of Therapy:  Heparin level 0.3-0.7 units/ml Monitor platelets by anticoagulation protocol: Yes   Plan:  -Heparin 4000 units BOLUS -Start heparin drip at 1200 units/hr -0500 HL -Daily CBC/HL -Monitor for bleeding  Abran DukeLedford, Lashondra Vaquerano 02/29/2016,10:41 PM   ==================================== Addendum 5:23 AM Initial HL this AM is slightly supra-therapeutic this AM -Decrease heparin slightly to 1150 units/hr -1300 HL  Abran DukeJames Alicen Donalson, PharmD 03/01/2016 5:23 AM

## 2016-02-29 NOTE — ED Notes (Signed)
Reports chest pressure on and off since Wednesday.  Dull and tight all the time with intermittant spells of sharp pain that radiates to left shoulder blade.  Reports feeling sweaty.  No cardiac history.  Took Asa 324mg  pta.

## 2016-02-29 NOTE — H&P (Signed)
CC: CP  HPI: 65 yo [place] man with HTN, presents with CP. Started couple days ago, episodic, associated with SOB and diaphoresis. Felt like heaviness in chest radiating to left arm. Symptoms increased in intensity, frequency and now triggered by minimal activity. Had few episodes while playing golf. Had most intense episode after supper tonight when chest discomfort was 10/10. No known history of CAD, MI, HF, CVA, DM. Non smoker. No bleeding issues. No planned upcoming surgeries.   He took 4 baby ASA at home. In ER. Trop 0.15. Heparin bolus 4000 U given. Also requested to give Plavix 600 mg po x1. Required on sl NTG in ER.     Review of Systems:  12 systems reviewed unremarkable except as noted in HPI   Past Medical History  Diagnosis Date  . Hypertension   . CVS disease   . Hyperlipidemia   . Gilbert's disease   . Unspecified septicemia   . 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 Atypical facial pain   . Unspecified tinnitus     right  . Asthma    No current facility-administered medications on file prior to encounter.   Current Outpatient Prescriptions on File Prior to Encounter  Medication Sig Dispense Refill  . albuterol (PROAIR HFA) 108 (90 BASE) MCG/ACT inhaler 2 puffs every 4 hours as needed only  if your can't catch your breath 1 Inhaler 1  . aspirin 81 MG tablet Take 81 mg by mouth daily.    . Aspirin-Acetaminophen-Caffeine (GOODYS EXTRA STRENGTH PO) Take 1 packet by mouth daily as needed (pain).     . cetirizine (ZYRTEC) 10 MG tablet Take 10 mg by mouth daily.    . Garlic 1000 MG CAPS Take by mouth daily.    . mometasone-formoterol (DULERA) 200-5 MCG/ACT AERO Take 2 puffs first thing in am and then another 2 puffs about 12 hours later. 1 Inhaler 11  . naproxen sodium (ANAPROX) 220 MG tablet Take 220-440 mg by mouth 2 (two) times daily as needed (pain).     . pravastatin (PRAVACHOL) 80 MG tablet Take 1 tablet (80 mg total) by mouth every evening. 90 tablet 3  .  verapamil (COVERA HS) 240 MG (CO) 24 hr tablet Take 240 mg by mouth at bedtime.        Allergies  Allergen Reactions  . Oxycontin [Oxycodone Hcl] Anaphylaxis  . Percocet [Oxycodone-Acetaminophen] Anaphylaxis  . Ambien [Zolpidem Tartrate] Other (See Comments)    Mood changes     Social History   Social History  . Marital Status: Married    Spouse Name: N/A  . Number of Children: 2  . Years of Education: 14   Occupational History  . retired    Social History Main Topics  . Smoking status: Former Smoker -- 3 years    Types: Cigarettes    Quit date: 12/14/1968  . Smokeless tobacco: Never Used     Comment: Quit in 1970  . Alcohol Use: Yes     Comment: Social  . Drug Use: No  . Sexual Activity: Not on file   Other Topics Concern  . Not on file   Social History Narrative   Patient lives at home with his wife . He is retired. Patient has 14 years of education. Patient has two children.   Caffeine - two cups daily.    Family History  Problem Relation Age of Onset  . Dementia Father   . Alzheimer's disease Father   .  Heart disease Mother     Onset-Late 43s  . Heart disease Brother 60    PHYSICAL EXAM: Filed Vitals:   02/29/16 2230 02/29/16 2245  BP: 148/105 158/103  Pulse: 65 70  Temp:    Resp: 22 18   General:  Well appearing. No respiratory difficulty HEENT: normal Neck: supple. no JVD. Carotids 2+ bilat; no bruits. No lymphadenopathy or thryomegaly appreciated. Cor: PMI nondisplaced. Regular rate & rhythm. No rubs, gallops or murmurs. Lungs: clear Abdomen: soft, nontender, nondistended. No hepatosplenomegaly. No bruits or masses. Good bowel sounds. Extremities: no cyanosis, clubbing, rash, edema Neuro: alert & oriented x 3, cranial nerves grossly intact. moves all 4 extremities w/o difficulty. Affect pleasant.  ECG: reviewed by me -- NSR, normal AV conduction, narrow QRS, no ST elevation or significant depression  Results for orders placed or performed  during the hospital encounter of 02/29/16 (from the past 24 hour(s))  Basic metabolic panel     Status: Abnormal   Collection Time: 02/29/16  9:40 PM  Result Value Ref Range   Sodium 144 135 - 145 mmol/L   Potassium 3.4 (L) 3.5 - 5.1 mmol/L   Chloride 102 101 - 111 mmol/L   CO2 27 22 - 32 mmol/L   Glucose, Bld 122 (H) 65 - 99 mg/dL   BUN 11 6 - 20 mg/dL   Creatinine, Ser 1.61 0.61 - 1.24 mg/dL   Calcium 9.8 8.9 - 09.6 mg/dL   GFR calc non Af Amer >60 >60 mL/min   GFR calc Af Amer >60 >60 mL/min   Anion gap 15 5 - 15  CBC     Status: None   Collection Time: 02/29/16  9:40 PM  Result Value Ref Range   WBC 5.5 4.0 - 10.5 K/uL   RBC 5.25 4.22 - 5.81 MIL/uL   Hemoglobin 16.4 13.0 - 17.0 g/dL   HCT 04.5 40.9 - 81.1 %   MCV 90.3 78.0 - 100.0 fL   MCH 31.2 26.0 - 34.0 pg   MCHC 34.6 30.0 - 36.0 g/dL   RDW 91.4 78.2 - 95.6 %   Platelets 219 150 - 400 K/uL  I-stat troponin, ED (not at [facility], [facility])     Status: Abnormal   Collection Time: 02/29/16  9:46 PM  Result Value Ref Range   Troponin i, poc 0.15 (HH) 0.00 - 0.08 ng/mL   Comment NOTIFIED PHYSICIAN    Comment 3           Dg Chest 2 View  02/29/2016  CLINICAL DATA:  Chest pain for 4 days. EXAM: CHEST  2 VIEW COMPARISON:  02/04/2011 FINDINGS: The heart size and mediastinal contours are within normal limits. Both lungs are clear. The visualized skeletal structures are unremarkable. IMPRESSION: No active cardiopulmonary disease. Electronically Signed   By: Ellery Plunk M.D.   On: 02/29/2016 22:30    A/P:   1. NSTE-ACS - CAD risk factors include male gender, age, HTN - Stable hemodynamically, no shock or acute HF or unstable arrhythmias - TIMI score 3 (age >45, positive bTropnin, 2 or more angina episodes in 24 hours)  corresponding to 13% risk of major adverse events in 14 days  - GRACE score 89 corresponding to 3% risk of mortality within 6 months     Will admit him to cardiology  ASA 81 mg po qd Plavix 75 mg po qd Heparin  infusion  Serial Trop High intensity statin  Beta blockers Sl NTG as needed; may need NTG infusion  Plan for cath on Monday -- sooner if worsening symptoms, significant ECG changes, Ventricular arrhythmias or hemodynamic instability  Echo  Discussed risks and benefits of cath with patient and family at the bedside.   Please refer to orders for other details     Nevin BloodgoodAmir Baillie Mohammad, MD Cardiology

## 2016-02-29 NOTE — ED Provider Notes (Signed)
CSN: [id number]     Arrival date & time 02/29/16  2111 History   First MD Initiated Contact with Patient 02/29/16 2213     Chief Complaint  Patient presents with  . Chest Pain     (Consider location/radiation/quality/duration/timing/severity/associated sxs/prior Treatment) HPI Comments: 65 year old male history of asthma, reflux, high blood pressure, family history of cardiac and his mother, nonsmoker presents with intermittent chest discomfort with mild radiation the left shoulder. Mild diaphoresis. Chest pressure since Wednesday on and off. Worse with exertion walking/golfing.no known cardiac disease or heart attack. No recent surgeries no blood clot history.  Patient is a 65 y.o. male presenting with chest pain. The history is provided by the patient.  Chest Pain Associated symptoms: diaphoresis   Associated symptoms: no abdominal pain, no back pain, no fever, no headache, no shortness of breath and not vomiting     Past Medical History  Diagnosis Date  . Hypertension   . CVS disease   . Hyperlipidemia   . Gilbert's disease   . Unspecified septicemia   . 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 Atypical facial pain   . Unspecified tinnitus     right  . Asthma    Past Surgical History  Procedure Laterality Date  . Neck surgery    . Back surgery    . Nasal sinus surgery  08/2011  . Knee surgery Bilateral 2005    replacement,partial knee replacement because of bacterial infection-2012  . Cardiovascular stress test  05/26/2012    EKG negative for ischemia, no ECG changes, no significant ischemia noted  . Transthoracic echocardiogram  05/26/2012    EF >55%, mild concentric LVH   Family History  Problem Relation Age of Onset  . Dementia Father   . Alzheimer's disease Father   . Heart disease Mother     Onset-Late 59s  . Heart disease Brother 49   Social History  Substance Use Topics  . Smoking status: Former Smoker -- 3 years    Types: Cigarettes    Quit date:  12/14/1968  . Smokeless tobacco: Never Used     Comment: Quit in 1970  . Alcohol Use: Yes     Comment: Social    Review of Systems  Constitutional: Positive for diaphoresis. Negative for fever and chills.  HENT: Negative for congestion.   Eyes: Negative for visual disturbance.  Respiratory: Negative for shortness of breath.   Cardiovascular: Positive for chest pain.  Gastrointestinal: Negative for vomiting and abdominal pain.  Genitourinary: Negative for dysuria and flank pain.  Musculoskeletal: Negative for back pain, neck pain and neck stiffness.  Skin: Negative for rash.  Neurological: Negative for light-headedness and headaches.      Allergies  Oxycontin; Percocet; and Ambien  Home Medications   Prior to Admission medications   Medication Sig Start Date End Date Taking? Authorizing Provider  albuterol (PROAIR HFA) 108 (90 BASE) MCG/ACT inhaler 2 puffs every 4 hours as needed only  if your can't catch your breath 08/22/15  Yes Nyoka Cowden, MD  aspirin 81 MG tablet Take 81 mg by mouth daily.   Yes Historical Provider, MD  Aspirin-Acetaminophen-Caffeine (GOODYS EXTRA STRENGTH PO) Take 1 packet by mouth daily as needed (pain).    Yes Historical Provider, MD  cetirizine (ZYRTEC) 10 MG tablet Take 10 mg by mouth daily.   Yes Historical Provider, MD  fluticasone (FLONASE) 50 MCG/ACT nasal spray Place 2 sprays into both nostrils daily.   Yes Historical Provider, MD  Garlic 1000 MG CAPS Take by mouth daily.   Yes Historical Provider, MD  mometasone-formoterol (DULERA) 200-5 MCG/ACT AERO Take 2 puffs first thing in am and then another 2 puffs about 12 hours later. 10/18/13  Yes Nyoka CowdenMichael B Wert, MD  Multiple Vitamin (MULTIVITAMIN WITH MINERALS) TABS tablet Take 1 tablet by mouth daily.   Yes Historical Provider, MD  naproxen sodium (ANAPROX) 220 MG tablet Take 220-440 mg by mouth 2 (two) times daily as needed (pain).    Yes Historical Provider, MD  omeprazole (PRILOSEC) 40 MG capsule  Take 40 mg by mouth daily.   Yes Historical Provider, MD  pravastatin (PRAVACHOL) 80 MG tablet Take 1 tablet (80 mg total) by mouth every evening. 09/28/13  Yes Chrystie NoseKenneth C [name], MD  verapamil (COVERA HS) 240 MG (CO) 24 hr tablet Take 240 mg by mouth at bedtime.   Yes Historical Provider, MD   BP 149/99 mmHg  Pulse 65  Temp(Src) 97.6 F (36.4 C) (Oral)  Resp 22  Ht 6\' 3"  (1.905 m)  Wt 210 lb (95.255 kg)  BMI 26.25 kg/m2  SpO2 92% Physical Exam  Constitutional: He is oriented to person, place, and time. He appears well-developed and well-nourished.  HENT:  Head: Normocephalic and atraumatic.  Eyes: Conjunctivae are normal. Right eye exhibits no discharge. Left eye exhibits no discharge.  Neck: Normal range of motion. Neck supple. No tracheal deviation present.  Cardiovascular: Normal rate, regular rhythm and intact distal pulses.   Pulmonary/Chest: Effort normal and breath sounds normal.  Abdominal: Soft. He exhibits no distension. There is no tenderness. There is no guarding.  Musculoskeletal: He exhibits no edema.  Neurological: He is alert and oriented to person, place, and time.  Skin: Skin is warm. No rash noted. He is diaphoretic.  Psychiatric: He has a normal mood and affect.  Nursing note and vitals reviewed.   ED Course  Procedures (including critical care time) CRITICAL CARE Performed by: Enid SkeensZAVITZ, Koltan Portocarrero M   Total critical care time: 35 minutes  Critical care time was exclusive of separately billable procedures and treating other patients.  Critical care was necessary to treat or prevent imminent or life-threatening deterioration.  Critical care was time spent personally by me on the following activities: development of treatment plan with patient and/or surrogate as well as nursing, discussions with consultants, evaluation of patient's response to treatment, examination of patient, obtaining history from patient or surrogate, ordering and performing treatments and  interventions, ordering and review of laboratory studies, ordering and review of radiographic studies, pulse oximetry and re-evaluation of patient's condition.  Labs Review Labs Reviewed  BASIC METABOLIC PANEL - Abnormal; Notable for the following:    Potassium 3.4 (*)    Glucose, Bld 122 (*)    All other components within normal limits  I-STAT TROPOININ, ED - Abnormal; Notable for the following:    Troponin i, poc 0.15 (*)    All other components within normal limits  CBC    Imaging Review Dg Chest 2 View  02/29/2016  CLINICAL DATA:  Chest pain for 4 days. EXAM: CHEST  2 VIEW COMPARISON:  02/04/2011 FINDINGS: The heart size and mediastinal contours are within normal limits. Both lungs are clear. The visualized skeletal structures are unremarkable. IMPRESSION: No active cardiopulmonary disease. Electronically Signed   By: Ellery Plunkaniel R Mitchell M.D.   On: 02/29/2016 22:30   I have personally reviewed and evaluated these images and lab results as part of my medical decision-making.   EKG Interpretation  Date/Time:  Saturday February 29 2016 21:19:05 EDT Ventricular Rate:  74 PR Interval:  163 QRS Duration: 108 QT Interval:  416 QTC Calculation: 461 R Axis:   48 Text Interpretation:  Sinus rhythm mild st depression inferior Reconfirmed  by Deavion Dobbs  MD, Esmeralda Malay ([id number]) on 02/29/2016 10:41:28 PM     Repeat EKG reviewed heart rate 71, normal QT,very minimal ST elevation V2 with T-wave inversion, minimal ST depression aVF. MDM   Final diagnoses:  Unstable angina [facility])   Patient presents with concerns for unstable angina, minimal ST depression inferior leads reviewedEKG from EMS and EKG in the ER. Exertional component, subtle EKG changes, bumped troponin. Cardiology on-call for interventional and general cardiologist paged. Patient had aspirin. Heparin ordered IV.  Discussed with cardiology agreed with heparin, aspirin, nitroglycerin as needed and they will see the patient in the ER to  discuss cath. The patients results and plan were reviewed and discussed.   Any x-rays performed were independently reviewed by myself.   Differential diagnosis were considered with the presenting HPI.  Medications  nitroGLYCERIN (NITROSTAT) SL tablet 0.4 mg (0.4 mg Sublingual Given 02/29/16 2243)  0.9 %  sodium chloride infusion (not administered)  heparin bolus via infusion 4,000 Units (not administered)  heparin ADULT infusion 100 units/mL (25000 units/250 mL) (not administered)  morphine 4 MG/ML injection 4 mg (4 mg Intravenous Given 02/29/16 2243)    Filed Vitals:   02/29/16 2125 02/29/16 2130 02/29/16 2145 02/29/16 2200  BP:  168/108 157/100 149/99  Pulse: 73 71 69 65  Temp:      TempSrc:      Resp: Height:      Weight:      SpO2: 95% 93% 95% 92%    Final diagnoses:  Unstable angina (HCC)    Admission/ observation were discussed with the admitting physician, patient and/or family and they are comfortable with the plan.     Blane Ohara, MD 02/29/16 [phone]

## 2016-03-01 ENCOUNTER — Encounter (HOSPITAL_COMMUNITY)
Admission: EM | Disposition: A | Discharge status: Home / Self Care | Payer: Self-pay | Source: Home / Self Care | Attending: Internal Medicine

## 2016-03-01 ENCOUNTER — Inpatient Hospital Stay (HOSPITAL_COMMUNITY): Payer: Medicare Other

## 2016-03-01 ENCOUNTER — Encounter (HOSPITAL_COMMUNITY): Payer: Self-pay | Admitting: *Deleted

## 2016-03-01 DIAGNOSIS — I2510 Atherosclerotic heart disease of native coronary artery without angina pectoris: Secondary | ICD-10-CM

## 2016-03-01 DIAGNOSIS — I214 Non-ST elevation (NSTEMI) myocardial infarction: Secondary | ICD-10-CM | POA: Diagnosis present

## 2016-03-01 DIAGNOSIS — R079 Chest pain, unspecified: Secondary | ICD-10-CM

## 2016-03-01 DIAGNOSIS — I10 Essential (primary) hypertension: Secondary | ICD-10-CM

## 2016-03-01 DIAGNOSIS — E785 Hyperlipidemia, unspecified: Secondary | ICD-10-CM

## 2016-03-01 HISTORY — PX: CARDIAC CATHETERIZATION: SHX172

## 2016-03-01 LAB — HEPARIN LEVEL (UNFRACTIONATED)
Heparin Unfractionated: 0.71 IU/mL — ABNORMAL HIGH (ref 0.30–0.70)
Heparin Unfractionated: 0.57 IU/mL (ref 0.30–0.70)

## 2016-03-01 LAB — CBC
HEMATOCRIT: 44.8 % (ref 39.0–52.0)
HEMOGLOBIN: 15.2 g/dL (ref 13.0–17.0)
MCH: 30.6 pg (ref 26.0–34.0)
MCHC: 33.9 g/dL (ref 30.0–36.0)
MCV: 90.1 fL (ref 78.0–100.0)
Platelets: 200 10*3/uL (ref 150–400)
RBC: 4.97 MIL/uL (ref 4.22–5.81)
RDW: 13.5 % (ref 11.5–15.5)
WBC: 5.1 10*3/uL (ref 4.0–10.5)

## 2016-03-01 LAB — LIPID PANEL
CHOLESTEROL: 165 mg/dL (ref 0–200)
HDL: 55 mg/dL (ref >40)
LDL CALC: 93 mg/dL (ref 0–99)
TRIGLYCERIDES: 85 mg/dL (ref <150)
Total CHOL/HDL Ratio: 3 RATIO
VLDL: 17 mg/dL (ref 0–40)

## 2016-03-01 LAB — BASIC METABOLIC PANEL
ANION GAP: 9 (ref 5–15)
BUN: 11 mg/dL (ref 6–20)
CHLORIDE: 104 mmol/L (ref 101–111)
CO2: 29 mmol/L (ref 22–32)
Calcium: 9.4 mg/dL (ref 8.9–10.3)
Creatinine, Ser: 0.75 mg/dL (ref 0.61–1.24)
GFR calc non Af Amer: >60 mL/min (ref >60)
Glucose, Bld: 95 mg/dL (ref 65–99)
POTASSIUM: 3.9 mmol/L (ref 3.5–5.1)
SODIUM: 142 mmol/L (ref 135–145)

## 2016-03-01 LAB — ECHOCARDIOGRAM COMPLETE
Height: 75 in
Weight: 3329.6 oz

## 2016-03-01 LAB — TROPONIN I
TROPONIN I: 0.48 ng/mL — AB (ref <0.031)
TROPONIN I: 0.26 ng/mL — AB (ref <0.031)
TROPONIN I: 0.14 ng/mL — AB (ref <0.031)

## 2016-03-01 LAB — BRAIN NATRIURETIC PEPTIDE: B Natriuretic Peptide: 51 pg/mL (ref 0.0–100.0)

## 2016-03-01 LAB — MAGNESIUM: MAGNESIUM: 1.9 mg/dL (ref 1.7–2.4)

## 2016-03-01 LAB — MRSA PCR SCREENING: MRSA by PCR: NEGATIVE

## 2016-03-01 SURGERY — LEFT HEART CATH AND CORONARY ANGIOGRAPHY
Anesthesia: Moderate Sedation

## 2016-03-01 MED ORDER — ASPIRIN EC 81 MG PO TBEC
81.0000 mg | DELAYED_RELEASE_TABLET | Freq: Every day | ORAL | Status: DC
  Administered 2016-03-01 – 2016-03-03 (×3): 81 mg via ORAL
  Filled 2016-03-01 (×3): qty 1

## 2016-03-01 MED ORDER — MIDAZOLAM HCL 2 MG/2ML IJ SOLN
INTRAMUSCULAR | Status: AC
  Filled 2016-03-01: qty 2

## 2016-03-01 MED ORDER — SODIUM CHLORIDE 0.9 % WEIGHT BASED INFUSION
1.0000 mL/kg/h | INTRAVENOUS | Status: DC
  Administered 2016-03-01: 1 mL/kg/h via INTRAVENOUS

## 2016-03-01 MED ORDER — NITROGLYCERIN 1 MG/10 ML FOR IR/CATH LAB
INTRA_ARTERIAL | Status: DC | PRN
  Administered 2016-03-01 (×2): 200 ug via INTRACORONARY

## 2016-03-01 MED ORDER — ONDANSETRON HCL 4 MG/2ML IJ SOLN: 4.0000 mg | Freq: Four times a day (QID) | INTRAMUSCULAR | Status: DC | PRN

## 2016-03-01 MED ORDER — IOHEXOL 350 MG/ML SOLN
INTRAVENOUS | Status: DC | PRN
  Administered 2016-03-01: 140 mL via INTRAVENOUS

## 2016-03-01 MED ORDER — METOPROLOL TARTRATE 1 MG/ML IV SOLN: 2.5000 mg | Freq: Once | INTRAVENOUS | Status: DC

## 2016-03-01 MED ORDER — MORPHINE SULFATE (PF) 2 MG/ML IV SOLN
INTRAVENOUS | Status: AC
  Administered 2016-03-01: 2 mg via INTRAVENOUS
  Filled 2016-03-01: qty 1

## 2016-03-01 MED ORDER — LIDOCAINE HCL (PF) 1 % IJ SOLN
INTRAMUSCULAR | Status: DC | PRN
  Administered 2016-03-01: 5 mL

## 2016-03-01 MED ORDER — TICAGRELOR 90 MG PO TABS
90.0000 mg | ORAL_TABLET | Freq: Two times a day (BID) | ORAL | Status: DC
  Administered 2016-03-02 – 2016-03-03 (×3): 90 mg via ORAL
  Filled 2016-03-01 (×3): qty 1

## 2016-03-01 MED ORDER — FENTANYL CITRATE (PF) 100 MCG/2ML IJ SOLN
INTRAMUSCULAR | Status: AC
  Filled 2016-03-01: qty 2

## 2016-03-01 MED ORDER — HEPARIN SODIUM (PORCINE) 1000 UNIT/ML IJ SOLN
INTRAMUSCULAR | Status: DC | PRN
  Administered 2016-03-01: 3000 [IU] via INTRAVENOUS
  Administered 2016-03-01: 8000 [IU] via INTRAVENOUS

## 2016-03-01 MED ORDER — ASPIRIN 81 MG PO CHEW: 81.0000 mg | CHEWABLE_TABLET | ORAL | Status: DC

## 2016-03-01 MED ORDER — SODIUM CHLORIDE 0.9% FLUSH: 3.0000 mL | INTRAVENOUS | Status: DC | PRN

## 2016-03-01 MED ORDER — METOPROLOL TARTRATE 12.5 MG HALF TABLET
12.5000 mg | ORAL_TABLET | Freq: Two times a day (BID) | ORAL | Status: DC
  Administered 2016-03-01 (×2): 12.5 mg via ORAL
  Filled 2016-03-01 (×2): qty 1

## 2016-03-01 MED ORDER — MIDAZOLAM HCL 2 MG/2ML IJ SOLN
INTRAMUSCULAR | Status: DC | PRN
  Administered 2016-03-01: 1 mg via INTRAVENOUS
  Administered 2016-03-01: 2 mg via INTRAVENOUS

## 2016-03-01 MED ORDER — PANTOPRAZOLE SODIUM 40 MG PO TBEC
40.0000 mg | DELAYED_RELEASE_TABLET | Freq: Every day | ORAL | Status: DC
  Administered 2016-03-01 – 2016-03-03 (×3): 40 mg via ORAL
  Filled 2016-03-01 (×3): qty 1

## 2016-03-01 MED ORDER — NITROGLYCERIN IN D5W 200-5 MCG/ML-% IV SOLN
5.0000 ug/min | INTRAVENOUS | Status: DC
  Administered 2016-03-01: 5 ug/min via INTRAVENOUS
  Administered 2016-03-01: 10 ug/min via INTRAVENOUS
  Filled 2016-03-01: qty 250

## 2016-03-01 MED ORDER — MORPHINE SULFATE (PF) 2 MG/ML IV SOLN
2.0000 mg | Freq: Once | INTRAVENOUS | Status: AC
  Administered 2016-03-01: 2 mg via INTRAVENOUS

## 2016-03-01 MED ORDER — CLOPIDOGREL BISULFATE 75 MG PO TABS
75.0000 mg | ORAL_TABLET | Freq: Every day | ORAL | Status: DC
  Administered 2016-03-01: 75 mg via ORAL
  Filled 2016-03-01: qty 1

## 2016-03-01 MED ORDER — TICAGRELOR 90 MG PO TABS
ORAL_TABLET | ORAL | Status: DC | PRN
  Administered 2016-03-01: 180 mg via ORAL

## 2016-03-01 MED ORDER — NITROGLYCERIN 0.4 MG SL SUBL
0.4000 mg | SUBLINGUAL_TABLET | SUBLINGUAL | Status: DC | PRN
  Administered 2016-03-01 (×2): 0.4 mg via SUBLINGUAL
  Filled 2016-03-01 (×2): qty 1

## 2016-03-01 MED ORDER — VERAPAMIL HCL 2.5 MG/ML IV SOLN
INTRAVENOUS | Status: DC | PRN
  Administered 2016-03-01: 10 mL via INTRA_ARTERIAL

## 2016-03-01 MED ORDER — SODIUM CHLORIDE 0.9 % IV SOLN: 250.0000 mL | INTRAVENOUS | Status: DC | PRN

## 2016-03-01 MED ORDER — SODIUM CHLORIDE 0.9% FLUSH
3.0000 mL | Freq: Two times a day (BID) | INTRAVENOUS | Status: DC
  Administered 2016-03-01: 3 mL via INTRAVENOUS

## 2016-03-01 MED ORDER — ALUM & MAG HYDROXIDE-SIMETH 200-200-20 MG/5ML PO SUSP
15.0000 mL | Freq: Four times a day (QID) | ORAL | Status: DC | PRN
  Administered 2016-03-01: 15 mL via ORAL
  Filled 2016-03-01: qty 30

## 2016-03-01 MED ORDER — METOPROLOL TARTRATE 1 MG/ML IV SOLN
INTRAVENOUS | Status: AC
  Filled 2016-03-01: qty 5

## 2016-03-01 MED ORDER — HEPARIN (PORCINE) IN NACL 2-0.9 UNIT/ML-% IJ SOLN
INTRAMUSCULAR | Status: DC | PRN
  Administered 2016-03-01: 1500 mL

## 2016-03-01 MED ORDER — FENTANYL CITRATE (PF) 100 MCG/2ML IJ SOLN
INTRAMUSCULAR | Status: DC | PRN
  Administered 2016-03-01 (×2): 25 ug via INTRAVENOUS

## 2016-03-01 MED ORDER — ATORVASTATIN CALCIUM 80 MG PO TABS
80.0000 mg | ORAL_TABLET | Freq: Every day | ORAL | Status: DC
  Administered 2016-03-01 – 2016-03-02 (×3): 80 mg via ORAL
  Filled 2016-03-01 (×3): qty 1

## 2016-03-01 MED ORDER — ONDANSETRON HCL 4 MG/2ML IJ SOLN
4.0000 mg | Freq: Four times a day (QID) | INTRAMUSCULAR | Status: DC | PRN
  Administered 2016-03-01: 4 mg via INTRAVENOUS
  Filled 2016-03-01 (×2): qty 2

## 2016-03-01 MED ORDER — ACETAMINOPHEN 325 MG PO TABS
650.0000 mg | ORAL_TABLET | Freq: Four times a day (QID) | ORAL | Status: DC | PRN
  Administered 2016-03-01 (×3): 650 mg via ORAL
  Filled 2016-03-01 (×3): qty 2

## 2016-03-01 MED ORDER — MORPHINE SULFATE (PF) 4 MG/ML IV SOLN
INTRAVENOUS | Status: AC
  Filled 2016-03-01: qty 1

## 2016-03-01 MED ORDER — ALBUTEROL SULFATE (2.5 MG/3ML) 0.083% IN NEBU: 3.0000 mL | INHALATION_SOLUTION | Freq: Four times a day (QID) | RESPIRATORY_TRACT | Status: DC | PRN

## 2016-03-01 MED ORDER — TICAGRELOR 90 MG PO TABS
ORAL_TABLET | ORAL | Status: AC
  Filled 2016-03-01: qty 2

## 2016-03-01 MED ORDER — HEPARIN SODIUM (PORCINE) 1000 UNIT/ML IJ SOLN
INTRAMUSCULAR | Status: AC
  Filled 2016-03-01: qty 1

## 2016-03-01 MED ORDER — HEPARIN (PORCINE) IN NACL 2-0.9 UNIT/ML-% IJ SOLN
INTRAMUSCULAR | Status: AC
  Filled 2016-03-01: qty 1500

## 2016-03-01 MED ORDER — LIDOCAINE HCL (PF) 1 % IJ SOLN
INTRAMUSCULAR | Status: AC
  Filled 2016-03-01: qty 30

## 2016-03-01 MED ORDER — ACETAMINOPHEN 325 MG PO TABS
650.0000 mg | ORAL_TABLET | ORAL | Status: DC | PRN
  Administered 2016-03-02 (×2): 650 mg via ORAL
  Filled 2016-03-01 (×2): qty 2

## 2016-03-01 MED ORDER — VERAPAMIL HCL 2.5 MG/ML IV SOLN
INTRAVENOUS | Status: AC
  Filled 2016-03-01: qty 2

## 2016-03-01 MED ORDER — SODIUM CHLORIDE 0.9% FLUSH
3.0000 mL | Freq: Two times a day (BID) | INTRAVENOUS | Status: DC
  Administered 2016-03-02 (×2): 3 mL via INTRAVENOUS

## 2016-03-01 MED ORDER — METOPROLOL TARTRATE 25 MG PO TABS
25.0000 mg | ORAL_TABLET | Freq: Two times a day (BID) | ORAL | Status: DC
  Administered 2016-03-01 – 2016-03-02 (×3): 25 mg via ORAL
  Filled 2016-03-01 (×4): qty 1

## 2016-03-01 MED ORDER — SODIUM CHLORIDE 0.9 % IV SOLN
INTRAVENOUS | Status: AC
  Administered 2016-03-01: 21:00:00 via INTRAVENOUS

## 2016-03-01 SURGICAL SUPPLY — 18 items
BALLN EUPHORA RX 3.0X12 (BALLOONS) ×3
BALLN ~~LOC~~ EMERGE MR 4.0X6 (BALLOONS) ×3
BALLN ~~LOC~~ EMERGE MR 4.5X8 (BALLOONS) ×3
BALLOON EUPHORA RX 3.0X12 (BALLOONS) IMPLANT
BALLOON ~~LOC~~ EMERGE MR 4.0X6 (BALLOONS) IMPLANT
BALLOON ~~LOC~~ EMERGE MR 4.5X8 (BALLOONS) IMPLANT
CATH INFINITI JR4 5F (CATHETERS) ×2 IMPLANT
CATH VISTA GUIDE 6FR XBLAD3.5 (CATHETERS) ×2 IMPLANT
DEVICE RAD COMP TR BAND LRG (VASCULAR PRODUCTS) ×3 IMPLANT
GLIDESHEATH SLEND SS 6F .021 (SHEATH) ×2 IMPLANT
KIT ENCORE 26 ADVANTAGE (KITS) ×2 IMPLANT
KIT HEART LEFT (KITS) ×3 IMPLANT
PACK CARDIAC CATHETERIZATION (CUSTOM PROCEDURE TRAY) ×3 IMPLANT
STENT PROMUS PREM MR 4.0X12 (Permanent Stent) ×2 IMPLANT
TRANSDUCER W/STOPCOCK (MISCELLANEOUS) ×3 IMPLANT
TUBING CIL FLEX 10 FLL-RA (TUBING) ×3 IMPLANT
WIRE COUGAR XT STRL 190CM (WIRE) ×4 IMPLANT
WIRE SAFE-T 1.5MM-J .035X260CM (WIRE) ×2 IMPLANT

## 2016-03-01 NOTE — Progress Notes (Signed)
    Subjective:  Denies CP or dyspnea; had CP earlier now resolved   Objective:  Filed Vitals:   03/01/16 0503 03/01/16 0533 03/01/16 0705 03/01/16 0710  BP: 137/96 127/95 176/126 130/98  Pulse: 66 67    Temp:      TempSrc:      Resp:    15  Height:      Weight:      SpO2: 94% 98%      Intake/Output from previous day:  Intake/Output Summary (Last 24 hours) at 03/01/16 1004 Last data filed at 03/01/16 0717  Gross per 24 hour  Intake 991.42 ml  Output    900 ml  Net  91.42 ml    Physical Exam: Physical exam: Well-developed well-nourished in no acute distress.  Skin is warm and dry.  HEENT is normal.  Neck is supple.  Chest is clear to auscultation with normal expansion.  Cardiovascular exam is regular rate and rhythm.  Abdominal exam nontender or distended. No masses palpated. Extremities show no edema. neuro grossly intact    Lab Results: Basic Metabolic Panel:  Recent Labs  16/09/9602/18/17 2140 03/01/16 0448  NA 144 142  K 3.4* 3.9  CL 102 104  CO2 27 29  GLUCOSE 122* 95  BUN 11 11  CREATININE 0.81 0.75  CALCIUM 9.8 9.4  MG  --  1.9   CBC:  Recent Labs  02/29/16 2140 03/01/16 0448  WBC 5.5 5.1  HGB 16.4 15.2  HCT 47.4 44.8  MCV 90.3 90.1  PLT 219 200   Cardiac Enzymes:  Recent Labs  03/01/16 0104 03/01/16 0706  TROPONINI 0.48* 0.26*     Assessment/Plan:  1 NSTEMI-continue ASA, plavix, metoprolol, statin, heparin and NTG; increase NTG as needed. Troponin is abnormal and symptoms consistent with acute coronary syndrome. Plan cardiac catheterization tomorrow. The risks and benefits were discussed including myocardial infarction, death and CVA. Patient agrees to proceed. If he develops recurrent symptoms we may need to proceed sooner. He does not have ST elevation on his electrocardiogram which I personally reviewed. 2 hypertension-increase metoprolol to 25 mg twice a day. Add additional medications as needed. 3 hyperlipidemia-continue  statin.   Louis Perez 03/01/2016, 10:04 AM

## 2016-03-01 NOTE — Progress Notes (Signed)
ANTICOAGULATION CONSULT NOTE - Follow Up Consult  Pharmacy Consult for Heparin  Indication: chest pain/ACS  Allergies  Allergen Reactions  . Oxycontin [Oxycodone Hcl] Anaphylaxis  . Percocet [Oxycodone-Acetaminophen] Anaphylaxis  . Ambien [Zolpidem Tartrate] Other (See Comments)    Mood changes    Patient Measurements: Height: 6\' 3"  (190.5 cm) Weight: 208 lb 1.6 oz (94.394 kg) IBW/kg (Calculated) : 84.5  Vital Signs: Temp: 97.7 F (36.5 C) (03/19 1221) Temp Source: Oral (03/19 1221) BP: 130/98 mmHg (03/19 0710) Pulse Rate: 67 (03/19 0533)  Labs:  Recent Labs  02/29/16 2140 03/01/16 0104 03/01/16 0448 03/01/16 0706 03/01/16 1227  HGB 16.4  --  15.2  --   --   HCT 47.4  --  44.8  --   --   PLT 219  --  200  --   --   HEPARINUNFRC  --   --  0.71*  --  0.57  CREATININE 0.81  --  0.75  --   --   TROPONINI  --  0.48*  --  0.26*  --     Estimated Creatinine Clearance: 110 mL/min (by C-G formula based on Cr of 0.75).   Medical History: Past Medical History  Diagnosis Date  . Hypertension   . CVS disease   . Hyperlipidemia   . Gilbert's disease   . Unspecified septicemia   . 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 Atypical facial pain   . Unspecified tinnitus     right  . Asthma    Assessment: 65 y/o M with chest pressure, started heparin per pharmacy, no anti-coagulation PTA, POC troponin mildly elevated, no ECG changes plan cath in am - NTG drip, asa, clop, statin, metop CBC good, renal function good, other labs as above.  Heparin drip 1150 uts/hr HL 0.57 at goal  Goal of Therapy:  Heparin level 0.3-0.7 units/ml Monitor platelets by anticoagulation protocol: Yes   Plan:  Continue heparin drip at 1150 units/hr -Daily CBC/HL -Monitor for bleeding   Leota SauersLisa Bernell Haynie Pharm.D. CPP, BCPS Clinical Pharmacist [phone] 03/01/2016 1:59 PM

## 2016-03-01 NOTE — Progress Notes (Signed)
In at bedside treating patient for ongoing chest pain. VS obtained EKG done. Dr. Donnie Ahoilley notified that pain not relieved. NTG drip at 12 cc/hr. Heparin gtt at 11.5 cc/hr. Discontinued per cath orders. Pt has received Lopressor 12.5mg  IV, Morphine Sulfate 2 mg IV &  Zofran 4 mg for nausea. R radial and R groin shaved and prepped for emergent cardiac catheterization.  Dr. Excell Seltzerooper in to see and evaluate patient. Consent form in front of chart.  Wife contacted. Explained to her that we are proceeding with the cardiac catheterization tonight because her husband is still having chest pain. She is heading back to the hospital.

## 2016-03-01 NOTE — Progress Notes (Signed)
Called to see patient with recurrent chest pain. On my arrival he is uncomfortable. Complaining of headache, chest pain, and nausea. EKG now with evolving anterior T-wave changes. Patient was scheduled for cardiac catheterization tomorrow. We are going to proceed emergently in the setting of ongoing ischemic symptoms despite maximal medical therapy.  Tonny BollmanCooper, Selinda Korzeniewski 03/01/2016 7:48 PM

## 2016-03-01 NOTE — Progress Notes (Signed)
  RN called.  Patient more chest pain this AM relieved by NTG. Troponin 0.48. ECG without acute changes. Will start IV NTG 5 mcg/min. Patient to be seen on rounds. Tereso NewcomerScott Arthurine Oleary, PA-C   03/01/2016 7:41 AM

## 2016-03-01 NOTE — Interval H&P Note (Signed)
History and Physical Interval Note:  03/01/2016 7:48 PM  Louis Perez  has presented today for surgery, with the diagnosis of nstemi  The various methods of treatment have been discussed with the patient and family. After consideration of risks, benefits and other options for treatment, the patient has consented to  Procedure(s): Left Heart Cath and Coronary Angiography (N/A) as a surgical intervention .  The patient's history has been reviewed, patient examined, no change in status, stable for surgery.  I have reviewed the patient's chart and labs.  Questions were answered to the patient's satisfaction.     Tonny Bollmanooper, Ashani Pumphrey

## 2016-03-01 NOTE — Progress Notes (Signed)
Patient is chest pain free at this time. B/P 130/93,HR=63 , 97% on r/a , troponin resulted at 0.48. Dr. Tarri GlennAzeem paged to notifiy. Pt is on heparin gtt. [facility] Parveen Freehling [employer]

## 2016-03-01 NOTE — Progress Notes (Signed)
Patient called nurse to room with chest pain/pressure 10/10 after standing to void in urinal. EKG obtained, b/p elevated, Nitro sl x 1 given with relief. B/p returned to normal. PA on call for cardiology paged. [place] Nykiah Ma [employer]

## 2016-03-01 NOTE — ED Notes (Signed)
Attempted report at this time.  Nurse to call back when available. 

## 2016-03-01 NOTE — Progress Notes (Signed)
Patient complaining of 7/10 left chest pain with nausea and diaphoresis.  On IV heparin and NTG.  IV NTG titrated to 5240mcg/min.  EKG obtained with flipped T waves in V1 and V2 and flattening T waves in V3-4.  Chest pain down to 3/10.  Dr. Donnie Ahoilley paged and notified. Orders received.  2.5mg  IV lopressor given.  Patient with increased pain back up to 7/10.  2mg  morphine given.  Dr. Excell Seltzerooper up to see patient.  Patient transported to the Cath Lab.  I called and updated patient's wife, she is on her way back to the hospital.  Colman Caterarpley, Justine Cossin Danielle

## 2016-03-01 NOTE — H&P (View-Only) (Signed)
    Subjective:  Denies CP or dyspnea; had CP earlier now resolved   Objective:  Filed Vitals:   03/01/16 0503 03/01/16 0533 03/01/16 0705 03/01/16 0710  BP: 137/96 127/95 176/126 130/98  Pulse: 66 67    Temp:      TempSrc:      Resp:    15  Height:      Weight:      SpO2: 94% 98%      Intake/Output from previous day:  Intake/Output Summary (Last 24 hours) at 03/01/16 1004 Last data filed at 03/01/16 0717  Gross per 24 hour  Intake 991.42 ml  Output    900 ml  Net  91.42 ml    Physical Exam: Physical exam: Well-developed well-nourished in no acute distress.  Skin is warm and dry.  HEENT is normal.  Neck is supple.  Chest is clear to auscultation with normal expansion.  Cardiovascular exam is regular rate and rhythm.  Abdominal exam nontender or distended. No masses palpated. Extremities show no edema. neuro grossly intact    Lab Results: Basic Metabolic Panel:  Recent Labs  02/29/16 2140 03/01/16 0448  NA 144 142  K 3.4* 3.9  CL 102 104  CO2 27 29  GLUCOSE 122* 95  BUN 11 11  CREATININE 0.81 0.75  CALCIUM 9.8 9.4  MG  --  1.9   CBC:  Recent Labs  02/29/16 2140 03/01/16 0448  WBC 5.5 5.1  HGB 16.4 15.2  HCT 47.4 44.8  MCV 90.3 90.1  PLT 219 200   Cardiac Enzymes:  Recent Labs  03/01/16 0104 03/01/16 0706  TROPONINI 0.48* 0.26*     Assessment/Plan:  1 NSTEMI-continue ASA, plavix, metoprolol, statin, heparin and NTG; increase NTG as needed. Troponin is abnormal and symptoms consistent with acute coronary syndrome. Plan cardiac catheterization tomorrow. The risks and benefits were discussed including myocardial infarction, death and CVA. Patient agrees to proceed. If he develops recurrent symptoms we may need to proceed sooner. He does not have ST elevation on his electrocardiogram which I personally reviewed. 2 hypertension-increase metoprolol to 25 mg twice a day. Add additional medications as needed. 3 hyperlipidemia-continue  statin.   Kalan Rinn 03/01/2016, 10:04 AM    

## 2016-03-01 NOTE — Progress Notes (Signed)
Patient called nurse to room and c/o chest pain 5/10. Vital signs obtained and b/p elevated. EKG obtained and nitro sl x1 given with relief. Patient states its a pressure type of pain. Patient now c/o headache from nitro. Dr. Tarri GlennAzeem paged to make aware of chest pain and need for tylenol. Will continue to monitor. [facility] Anslie [employer]

## 2016-03-02 ENCOUNTER — Encounter (HOSPITAL_COMMUNITY): Payer: Self-pay | Admitting: Cardiovascular Disease

## 2016-03-02 DIAGNOSIS — I214 Non-ST elevation (NSTEMI) myocardial infarction: Principal | ICD-10-CM

## 2016-03-02 LAB — BASIC METABOLIC PANEL
Anion gap: 7 (ref 5–15)
BUN: 12 mg/dL (ref 6–20)
CALCIUM: 9 mg/dL (ref 8.9–10.3)
CHLORIDE: 105 mmol/L (ref 101–111)
CO2: 27 mmol/L (ref 22–32)
CREATININE: 0.75 mg/dL (ref 0.61–1.24)
Glucose, Bld: 107 mg/dL — ABNORMAL HIGH (ref 65–99)
Potassium: 3.7 mmol/L (ref 3.5–5.1)
SODIUM: 139 mmol/L (ref 135–145)

## 2016-03-02 LAB — POCT ACTIVATED CLOTTING TIME
Activated Clotting Time: 291 seconds
Activated Clotting Time: 317 seconds
Activated Clotting Time: 229 seconds

## 2016-03-02 LAB — HEMOGLOBIN A1C
HEMOGLOBIN A1C: 5.3 % (ref 4.8–5.6)
Mean Plasma Glucose: 105 mg/dL

## 2016-03-02 LAB — CBC
HCT: 47.7 % (ref 39.0–52.0)
Hemoglobin: 16.1 g/dL (ref 13.0–17.0)
MCH: 30.6 pg (ref 26.0–34.0)
MCHC: 33.8 g/dL (ref 30.0–36.0)
MCV: 90.7 fL (ref 78.0–100.0)
PLATELETS: 219 10*3/uL (ref 150–400)
RBC: 5.26 MIL/uL (ref 4.22–5.81)
RDW: 13.4 % (ref 11.5–15.5)
WBC: 8.1 10*3/uL (ref 4.0–10.5)

## 2016-03-02 LAB — HEPATIC FUNCTION PANEL
ALK PHOS: 66 U/L (ref 38–126)
ALT: 18 U/L (ref 17–63)
AST: 25 U/L (ref 15–41)
Albumin: 3.4 g/dL — ABNORMAL LOW (ref 3.5–5.0)
BILIRUBIN INDIRECT: 1.6 mg/dL — AB (ref 0.3–0.9)
Bilirubin, Direct: 0.2 mg/dL (ref 0.1–0.5)
TOTAL PROTEIN: 6.3 g/dL — AB (ref 6.5–8.1)
Total Bilirubin: 1.8 mg/dL — ABNORMAL HIGH (ref 0.3–1.2)

## 2016-03-02 MED ORDER — LOSARTAN POTASSIUM 50 MG PO TABS
50.0000 mg | ORAL_TABLET | Freq: Every day | ORAL | Status: DC
  Administered 2016-03-02 – 2016-03-03 (×2): 50 mg via ORAL
  Filled 2016-03-02 (×2): qty 1

## 2016-03-02 NOTE — Progress Notes (Signed)
Report called to receiving rn [id number]. Patient with no complaints at the current time. Will transfer via WC.

## 2016-03-02 NOTE — Progress Notes (Signed)
TR band removed at 0100. Dressed with 2x2 gauze and tegaderm. Site is a level 0. Pt resting with no complaints at this time, will continue to monitor.

## 2016-03-02 NOTE — Progress Notes (Signed)
Patient's wife and son here. Pt belongings (clothing, sneakers, baseball cap, eyeglasses, cell phone and charger) given to wife. Family escorted to 2 H waiting room and  2 H secretary made aware that family is there.

## 2016-03-02 NOTE — Progress Notes (Signed)
CARDIAC REHAB PHASE I   PRE:  Rate/Rhythm: 59 SB  BP:  Sitting: 138/90        SaO2: 93 RA  MODE:  Ambulation: 890 ft   POST:  Rate/Rhythm: 58 SB  BP:  Sitting: 152/84         SaO2: 98 RA  Pt ambulated 890 ft on RA, independent, steady gait, tolerated well, no complaints. Completed MI/stent education with pt and wife at bedside.  Reviewed risk factors, anti-platelet therapy, stent card, activity restrictions, ntg, exercise, heart healthy diet, sodium restrictions, portion control, and phase 2 cardiac rehab. Pt verbalized understanding, receptive to education. Pt agrees to phase 2 cardiac rehab referral, will send to [facility] per pt request. Pt to recliner after walk, call bell within reach. Will follow.    [id number] Joylene GrapesEmily C Nelda Luckey, RN, BSN 03/02/2016 2:28 PM

## 2016-03-02 NOTE — Care Management Note (Addendum)
Case Management Note  Patient Details  Name: Louis Perez MRN: [id number] Date of Birth: 21-Nov-1951  Subjective/Objective:      Adm w mi              Action/Plan: lives w wife, pcp dr Rosezetta Schlatterburnette  Expected Discharge Date:                  Expected Discharge Plan:  Home/Self Care  In-House Referral:     Discharge planning Services  CM Consult, Medication Assistance  Post Acute Care Choice:    Choice offered to:     DME Arranged:    DME Agency:     HH Arranged:    HH Agency:     Status of Service:     Medicare Important Message Given:    Date Medicare IM Given:    Medicare IM give by:    Date Additional Medicare IM Given:    Additional Medicare Important Message give by:     If discussed at Long Length of Stay Meetings, dates discussed:    Per rep at [employer]:   Brilinta: Covered/ $20 for 30 day retail/ no auth required/ patient can use: [facility]     Additional Comments: gave pt 30day free brilinta card. Pt states medicare is primary.   Louis Perez, Louis Ting T, RN 03/02/2016, 10:35 AM

## 2016-03-02 NOTE — Progress Notes (Signed)
[facility] Pt in room, asleep, call light within reach. Will continue to monitor.  Leonidas Rombergaitlin S [place]umbledare, RN

## 2016-03-02 NOTE — Progress Notes (Signed)
Patient Name: Louis Perez E Gangwer Date of Encounter: 03/02/2016  Principal Problem:   NSTEMI (non-ST elevated myocardial infarction) [facility](HCC) Active Problems:   ACS (acute coronary syndrome) [facility](HCC)   Primary Cardiologist: Dr Rennis GoldenHilty Patient Profile: 65 yo male w/ hx HTN, HLD, GERD. Admitted 03/18 w/ CP>>NSTEMI>>more CP>>cath lab on 03/19>> DES LAD, EF ok.  SUBJECTIVE: No chest pain, no SOB overnight. Feels much better after the stent.   OBJECTIVE Filed Vitals:   03/02/16 0300 03/02/16 0400 03/02/16 0500 03/02/16 0600  BP: 139/94 129/87 148/94 120/99  Pulse: 63 62 66 53  Temp:  98.3 F (36.8 C)    TempSrc:  Oral    Resp: 18 12 25 15   Height:      Weight:   206 lb 2.1 oz (93.5 kg)   SpO2: 96% 94% 95% 95%    Intake/Output Summary (Last 24 hours) at 03/02/16 0716 Last data filed at 03/02/16 0130  Gross per 24 hour  Intake 421.67 ml  Output   1525 ml  Net -1103.33 ml   Filed Weights   03/01/16 0035 03/01/16 2100 03/02/16 0500  Weight: 208 lb 1.6 oz (94.394 kg) 206 lb 2.1 oz (93.5 kg) 206 lb 2.1 oz (93.5 kg)    PHYSICAL EXAM General: Well developed, well nourished, male in no acute distress. Head: Normocephalic, atraumatic.  Neck: Supple without bruits, JVD not elevated. Lungs:  Resp regular and unlabored, CTA. Heart: RRR, S1, S2, no S3, S4, or murmur; no rub. Abdomen: Soft, non-tender, non-distended, BS + x 4.  Extremities: No clubbing, cyanosis, edema. R radial cath site without ecchymosis or hematoma Neuro: Alert and oriented X 3. Moves all extremities spontaneously. Psych: Normal affect.  LABS: CBC:  Recent Labs  03/01/16 0448 03/02/16 0208  WBC 5.1 8.1  HGB 15.2 16.1  HCT 44.8 47.7  MCV 90.1 90.7  PLT 200 219   Basic Metabolic Panel:  Recent Labs  29/56/2103/19/17 0448 03/02/16 0208  NA 142 139  K 3.9 3.7  CL 104 105  CO2 29 27  GLUCOSE 95 107*  BUN 11 12  CREATININE 0.75 0.75  CALCIUM 9.4 9.0  MG 1.9  --    Liver Function Tests:  ordered  Cardiac Enzymes:  Recent Labs  03/01/16 0104 03/01/16 0706 03/01/16 1227  TROPONINI 0.48* 0.26* 0.14*    Recent Labs  02/29/16 2146  TROPIPOC 0.15*   BNP:  B NATRIURETIC PEPTIDE  Date/Time Value Ref Range Status  03/01/2016 01:04 AM 51.0 0.0 - 100.0 pg/mL Final   Fasting Lipid Panel:  Recent Labs  03/01/16 0448  CHOL 165  HDL 55  LDLCALC 93  TRIG 85  CHOLHDL 3.0   TELE: SR, S brady high 40s        ECG: 03/20 SR, w/ evolving MI changes, T wave inversions are deepening  ECHO: 03/19 - Left ventricle: The cavity size was normal. Wall thickness was  increased in a pattern of moderate LVH. Systolic function was  normal. The estimated ejection fraction was in the range of 60%  to 65%. Wall motion was normal; there were no regional wall  motion abnormalities. Doppler parameters are consistent with  abnormal left ventricular relaxation (grade 1 diastolic  dysfunction). The E/e&' ratio is <8, suggesting normal LV filling  pressure. - Mitral valve: Mildly thickened leaflets . There was trivial  regurgitation. - Left atrium: The atrium was normal in size. - Tricuspid valve: There was mild regurgitation. - Pulmonary arteries: PA peak pressure: 25 mm  Hg (S). - Inferior vena cava: The vessel was normal in size. The  respirophasic diameter changes were in the normal range (>= 50%),  consistent with normal central venous pressure. Impressions: - Compared to a prior echo in 2012, there are few changes. LVEF is  60-65%, grade 1 DD with normal LV filling pressure, normal LA  size.  Cath: 03/19   Mid RCA lesion, 25% stenosed.  Dist RCA lesion, 30% stenosed.  Ost RPDA lesion, 30% stenosed.  Ost Ramus to Ramus lesion, 25% stenosed.  Dist LAD lesion, 50% stenosed.  Ost Cx to Prox Cx lesion, 25% stenosed.  Ost LAD lesion, 95% stenosed. Post intervention, there is a 0% residual stenosis. 1. Severe single-vessel coronary artery disease with  successful PCI of the ostial LAD 2. Patent left main, ramus branch, left circumflex, and diffusely diseased RCA with no obstructive lesions 3. Normal LV function by echo Recommendations: Dual antiplatelet therapy with aspirin and brilinta at least 12 months. Would consider re-look cardiac catheterization in 6-12 months regardless of symptoms because of high risk anatomy. Keep tomorrow and ok to discharge Tuesday morning if stable.    Radiology/Studies: Dg Chest 2 View 02/29/2016  CLINICAL DATA:  Chest pain for 4 days. EXAM: CHEST  2 VIEW COMPARISON:  02/04/2011 FINDINGS: The heart size and mediastinal contours are within normal limits. Both lungs are clear. The visualized skeletal structures are unremarkable. IMPRESSION: No active cardiopulmonary disease. Electronically Signed   By: Ellery Plunk M.D.   On: 02/29/2016 22:30    Current Medications:  . aspirin EC  81 mg Oral Daily  . atorvastatin  80 mg Oral q1800  . metoprolol  2.5 mg Intravenous Once  . metoprolol tartrate  25 mg Oral BID  . pantoprazole  40 mg Oral Daily  . sodium chloride flush  3 mL Intravenous Q12H  . ticagrelor  90 mg Oral BID      ASSESSMENT AND PLAN: Principal Problem:   NSTEMI (non-ST elevated myocardial infarction) (HCC) - s/p DES LAD, mild-mod dz to be treated medically - relook cath in 6-12 months - on ASA, BB, statin, Brilinta - cardiac rehab to see - OK to tx telemetry, d/c in am if does well.  Active Problems:   ACS (acute coronary syndrome) (HCC) - see above    Sinus brady - BB is new, asymptomatic, follow for now, HR not below 45, may have been asleep  Signed, Barrett, Bjorn Loser , PA-C 7:16 AM 03/02/2016  Pt seen and examined  Agree wth findings as noted above   Lungs CTA  Card RRR  No S3  Ext  No edema  R wrist without swelling   Plan to tx to floor (tele)  Ambulate Will add cozaar for bettter BP contrl  Continue b blocker  High dose statin. Possible d/c tomorrow  Dietrich Pates

## 2016-03-03 ENCOUNTER — Telehealth: Payer: Self-pay | Admitting: Internal Medicine

## 2016-03-03 MED ORDER — NITROGLYCERIN 0.4 MG SL SUBL: 0.4000 mg | SUBLINGUAL_TABLET | SUBLINGUAL | Status: DC | PRN

## 2016-03-03 MED ORDER — LOSARTAN POTASSIUM 50 MG PO TABS: 50.0000 mg | ORAL_TABLET | Freq: Every day | ORAL | Status: DC

## 2016-03-03 MED ORDER — ATORVASTATIN CALCIUM 80 MG PO TABS: 80.0000 mg | ORAL_TABLET | Freq: Every day | ORAL | Status: DC

## 2016-03-03 MED ORDER — TICAGRELOR 90 MG PO TABS: 90.0000 mg | ORAL_TABLET | Freq: Two times a day (BID) | ORAL | Status: DC

## 2016-03-03 MED ORDER — METOPROLOL TARTRATE 25 MG PO TABS: 25.0000 mg | ORAL_TABLET | Freq: Two times a day (BID) | ORAL | Status: DC

## 2016-03-03 NOTE — Telephone Encounter (Signed)
New message    TCM appt on  3.28.2017 with Louis Perez  Per Vin PA.

## 2016-03-03 NOTE — Progress Notes (Signed)
Removed patients IV's. Reviewed discharge instructions. No issues at present. Awaiting discharge via wheelchair.  Areonna Bran, Charlaine DaltonAnn Brooke RN

## 2016-03-03 NOTE — Discharge Summary (Signed)
Discharge Summary    Patient ID: Louis Perez,  MRN: [id number], DOB/AGE: Mar 13, 1951 65 y.o.  Admit date: 02/29/2016 Discharge date: 03/03/2016  Primary Care Provider: BURNETT,BRENT A Primary Cardiologist: Dr. Rennis GoldenHilty (seen 09/2013)  Discharge Diagnoses    Principal Problem:   NSTEMI (non-ST elevated myocardial infarction) [facility](HCC) Active Problems:   ACS (acute coronary syndrome) (HCC)   Hypertension   Hyperlipedemia   Asthma   Allergies Allergies  Allergen Reactions  . Oxycontin [Oxycodone Hcl] Anaphylaxis  . Percocet [Oxycodone-Acetaminophen] Anaphylaxis  . Ambien [Zolpidem Tartrate] Other (See Comments)    Mood changes     Diagnostic Studies/Procedures    Procedures    Coronary Stent Intervention   Left Heart Cath and Coronary Angiography    Conclusion     Mid RCA lesion, 25% stenosed.  Dist RCA lesion, 30% stenosed.  Ost RPDA lesion, 30% stenosed.  Ost Ramus to Ramus lesion, 25% stenosed.  Dist LAD lesion, 50% stenosed.  Ost Cx to Prox Cx lesion, 25% stenosed.  Ost LAD lesion, 95% stenosed. Post intervention, there is a 0% residual stenosis.  1. Severe single-vessel coronary artery disease with successful PCI of the ostial LAD 2. Patent left main, ramus branch, left circumflex, and diffusely diseased RCA with no obstructive lesions 3. Normal LV function by echo  Recommendations: Dual antiplatelet therapy with aspirin and brilinta at least 12 months. Would consider re-look cardiac catheterization in 6-12 months regardless of symptoms because of high risk anatomy. Keep tomorrow and ok to discharge Tuesday morning if stable.    Echo 03/01/16 LV EF: 60% - 65%  ------------------------------------------------------------------- Indications: Chest pain 786.51.  ------------------------------------------------------------------- History: Risk factors: Family history of coronary artery disease. Hypertension.  Dyslipidemia.  ------------------------------------------------------------------- Study Conclusions  - Left ventricle: The cavity size was normal. Wall thickness was  increased in a pattern of moderate LVH. Systolic function was  normal. The estimated ejection fraction was in the range of 60%  to 65%. Wall motion was normal; there were no regional wall  motion abnormalities. Doppler parameters are consistent with  abnormal left ventricular relaxation (grade 1 diastolic  dysfunction). The E/e&' ratio is <8, suggesting normal LV filling  pressure. - Mitral valve: Mildly thickened leaflets . There was trivial  regurgitation. - Left atrium: The atrium was normal in size. - Tricuspid valve: There was mild regurgitation. - Pulmonary arteries: PA peak pressure: 25 mm Hg (S). - Inferior vena cava: The vessel was normal in size. The  respirophasic diameter changes were in the normal range (>= 50%),  consistent with normal central venous pressure.  Impressions:  - Compared to a prior echo in 2012, there are few changes. LVEF is  60-65%, grade 1 DD with normal LV filling pressure, normal LA  size.   History of Present Illness     65 yo man with HTN who  Presented 02/12/16  with CP. Started couple days ago, episodic, associated with SOB and diaphoresis. Felt like heaviness in chest radiating to left arm. Symptoms increased in intensity, frequency and now triggered by minimal activity. Had few episodes while playing golf. Had most intense episode after supper tonight when chest discomfort was 10/10. No known history of CAD, MI, HF, CVA, DM. Non smoker. No bleeding issues. No planned upcoming surgeries.   He took 4 baby ASA at home. In ER. Trop 0.15. Heparin bolus 4000 U given. Also requested to give Plavix 600 mg po x1. Required on sl NTG in ER.   Hospital Course  Consultants: None  1. NSTEMI (non-ST elevated myocardial infarction) (HCC) - Peak of troponin was 0.48 then  trended down. s/p DES LAD, mild-mod dz to be treated medically. Recommended re-look cath in 6-12 months. Ambulated without chest pain.  - Echo showed LVEF of 60-65%, grade 1 DD with normal LV filling pressure, normal LA size. - Continue ASA, Brillinta, metoprolol, statin, and losartan.   2 hypertension - Stable. Continue current regimen. Discontiued home Verapamil.   3 hyperlipidemia - 03/01/2016: Cholesterol 165; HDL 55; LDL Cholesterol 93; Triglycerides 85; VLDL 17  -Continue statin. Consider OP f/u labs 6-8 weeks given statin initiation this admission.  The patient has been seen by Dr. Tenny Craw today and deemed ready for discharge home. All follow-up appointments have been scheduled. Discharge medications are listed below.    Discharge Vitals Blood pressure 138/89, pulse 55, temperature 98 F (36.7 C), temperature source Oral, resp. rate 18, height  (1.905 m), weight 203 lb 7.8 oz (92.3 kg), SpO2 98 %.  Filed Weights   03/01/16 2100 03/02/16 0500 03/03/16 0438  Weight: 206 lb 2.1 oz (93.5 kg) 206 lb 2.1 oz (93.5 kg) 203 lb 7.8 oz (92.3 kg)    Labs & Radiologic Studies     CBC  Recent Labs  03/01/16 0448 03/02/16 0208  WBC 5.1 8.1  HGB 15.2 16.1  HCT 44.8 47.7  MCV 90.1 90.7  PLT 200 219   Basic Metabolic Panel  Recent Labs  03/01/16 0448 03/02/16 0208  NA 142 139  K 3.9 3.7  CL 104 105  CO2 29 27  GLUCOSE 95 107*  BUN 11 12  CREATININE 0.75 0.75  CALCIUM 9.4 9.0  MG 1.9  --    Liver Function Tests  Recent Labs  03/02/16 0208  AST 25  ALT 18  ALKPHOS 66  BILITOT 1.8*  PROT 6.3*  ALBUMIN 3.4*   No results for input(s): LIPASE, AMYLASE in the last 72 hours. Cardiac Enzymes  Recent Labs  03/01/16 0104 03/01/16 0706 03/01/16 1227  TROPONINI 0.48* 0.26* 0.14*   BNP Invalid input(s): POCBNP D-Dimer No results for input(s): DDIMER in the last 72 hours. Hemoglobin A1C  Recent Labs  03/01/16 0104  HGBA1C 5.3   Fasting Lipid  Panel  Recent Labs  03/01/16 0448  CHOL 165  HDL 55  LDLCALC 93  TRIG 85  CHOLHDL 3.0   Thyroid Function Tests No results for input(s): TSH, T4TOTAL, T3FREE, THYROIDAB in the last 72 hours.  Invalid input(s): FREET3  Dg Chest 2 View  02/29/2016  CLINICAL DATA:  Chest pain for 4 days. EXAM: CHEST  2 VIEW COMPARISON:  02/04/2011 FINDINGS: The heart size and mediastinal contours are within normal limits. Both lungs are clear. The visualized skeletal structures are unremarkable. IMPRESSION: No active cardiopulmonary disease. Electronically Signed   By: Ellery Plunk M.D.   On: 02/29/2016 22:30    Disposition   Pt is being discharged home today in good condition.  Follow-up Plans & Appointments    Follow-up Information    Follow up with [facility]. Go on 03/10/2016.   Specialty:  Cardiology   Why:  :00 for TCM with Darrol Jump, PA-C   Contact information:   [address] [place] [address] [phone]     Discharge Instructions    Amb Referral to Cardiac Rehabilitation    Complete by:  As directed   Diagnosis:   Myocardial Infarction PCI       Diet -  low sodium heart healthy    Complete by:  As directed      Discharge instructions    Complete by:  As directed   No driving for 2 weeks. No lifting over 10 lbs for 4 weeks. No sexual activity for 4 weeks. You may not return 03/09/16. Keep procedure site clean & dry. If you notice increased pain, swelling, bleeding or pus, call/return!  You may shower, but no soaking baths/hot tubs/pools for 1 week.     Increase activity slowly    Complete by:  As directed            Discharge Medications   Current Discharge Medication List    START taking these medications   Details  atorvastatin (LIPITOR) 80 MG tablet Take 1 tablet (80 mg total) by mouth daily at 6 PM. Qty: 30 tablet, Refills: 30    losartan (COZAAR) 50 MG tablet Take 1 tablet (50 mg total) by mouth  daily. Qty: 30 tablet, Refills: 11    metoprolol tartrate (LOPRESSOR) 25 MG tablet Take 1 tablet (25 mg total) by mouth 2 (two) times daily. Qty: 60 tablet, Refills: 6    nitroGLYCERIN (NITROSTAT) 0.4 MG SL tablet Place 1 tablet (0.4 mg total) under the tongue every 5 (five) minutes x 3 doses as needed for chest pain. Qty: 25 tablet, Refills: 12    ticagrelor (BRILINTA) 90 MG TABS tablet Take 1 tablet (90 mg total) by mouth 2 (two) times daily. Qty: 60 tablet, Refills: 11      CONTINUE these medications which have NOT CHANGED   Details  albuterol (PROAIR HFA) 108 (90 BASE) MCG/ACT inhaler 2 puffs every 4 hours as needed only  if your can't catch your breath Qty: 1 Inhaler, Refills: 1    aspirin 81 MG tablet Take 81 mg by mouth daily.    cetirizine (ZYRTEC) 10 MG tablet Take 10 mg by mouth daily.    fluticasone (FLONASE) 50 MCG/ACT nasal spray Place 2 sprays into both nostrils daily.    Garlic 1000 MG CAPS Take by mouth daily.    mometasone-formoterol (DULERA) 200-5 MCG/ACT AERO Take 2 puffs first thing in am and then another 2 puffs about 12 hours later. Qty: 1 Inhaler, Refills: 11    Multiple Vitamin (MULTIVITAMIN WITH MINERALS) TABS tablet Take 1 tablet by mouth daily.    omeprazole (PRILOSEC) 40 MG capsule Take 40 mg by mouth daily.      STOP taking these medications     Aspirin-Acetaminophen-Caffeine (GOODYS EXTRA STRENGTH PO)      verapamil (COVERA HS) 240 MG (CO) 24 hr tablet          Aspirin prescribed at discharge?  Yes High Intensity Statin Prescribed? (Lipitor 40-80mg  or Crestor 20-40mg ): Yes Beta Blocker Prescribed? Yes For EF 45% or less, Was ACEI/ARB Prescribed? Ef of 60-65%, on ARB.  ADP Receptor Inhibitor Prescribed? (i.e. Plavix etc.-Includes Medically Managed Patients): Yes For EF <40%, Aldosterone Inhibitor Prescribed? N/A Was EF assessed during THIS hospitalization? Yes Was Cardiac Rehab II ordered? (Included Medically managed Patients): Yes    Outstanding Labs/Studies    Consider OP f/u labs 6-8 weeks given statin initiation this admission.  Duration of Discharge Encounter   Greater than 30 minutes including physician time.  Signed, Bhagat,Bhavinkumar PA-C 03/03/2016, 11:21 AM    Pt seen and examined  Plans noted by B Bhagat above   Stable for discharge with outpt f/u.  Dietrich Pates

## 2016-03-03 NOTE — Progress Notes (Signed)
Patient Name: Louis Perez Date of Encounter: 03/03/2016  Primary cardiologist: Dr. Rennis Golden   SUBJECTIVE Feeling well. No chest pain, sob or palpitations. Ambulating well.   CURRENT MEDS . aspirin EC  81 mg Oral Daily  . atorvastatin  80 mg Oral q1800  . losartan  50 mg Oral Daily  . metoprolol  2.5 mg Intravenous Once  . metoprolol tartrate  25 mg Oral BID  . pantoprazole  40 mg Oral Daily  . sodium chloride flush  3 mL Intravenous Q12H  . ticagrelor  90 mg Oral BID    OBJECTIVE  Filed Vitals:   03/02/16 1258 03/02/16 2017 03/03/16 0438 03/03/16 0821  BP: 131/76 135/78 124/80 138/89  Pulse: 59 64 53 55  Temp: 98 F (36.7 C) 98 F (36.7 C) 98 F (36.7 C)   TempSrc: Oral Oral Oral   Resp: Height:      Weight:   203 lb 7.8 oz (92.3 kg)   SpO2: 95% 96% 98%     Intake/Output Summary (Last 24 hours) at 03/03/16 0937 Last data filed at 03/03/16 0504  Gross per 24 hour  Intake   1080 ml  Output      0 ml  Net   1080 ml   Filed Weights   03/01/16 2100 03/02/16 0500 03/03/16 0438  Weight: 206 lb 2.1 oz (93.5 kg) 206 lb 2.1 oz (93.5 kg) 203 lb 7.8 oz (92.3 kg)    PHYSICAL EXAM  General: Pleasant, NAD. Neuro: Alert and oriented X 3. Moves all extremities spontaneously. Psych: Normal affect. HEENT:  Normal  Neck: Supple without bruits or JVD. Lungs:  Resp regular and unlabored, CTA. Heart: RRR no s3, s4, or murmurs. Abdomen: Soft, non-tender, non-distended, BS + x 4.  Extremities: No clubbing, cyanosis or edema. DP/PT/Radials 2+ and equal bilaterally. R redial cath site without hematoma.   Accessory Clinical Findings  CBC  Recent Labs  03/01/16 0448 03/02/16 0208  WBC 5.1 8.1  HGB 15.2 16.1  HCT 44.8 47.7  MCV 90.1 90.7  PLT 200 219   Basic Metabolic Panel  Recent Labs  03/01/16 0448 03/02/16 0208  NA 142 139  K 3.9 3.7  CL 104 105  CO2 29 27  GLUCOSE 95 107*  BUN 11 12  CREATININE 0.75 0.75  CALCIUM 9.4 9.0  MG 1.9  --     Liver Function Tests  Recent Labs  03/02/16 0208  AST 25  ALT 18  ALKPHOS 66  BILITOT 1.8*  PROT 6.3*  ALBUMIN 3.4*   No results for input(s): LIPASE, AMYLASE in the last 72 hours. Cardiac Enzymes  Recent Labs  03/01/16 0104 03/01/16 0706 03/01/16 1227  TROPONINI 0.48* 0.26* 0.14*   BNP Invalid input(s): POCBNP D-Dimer No results for input(s): DDIMER in the last 72 hours. Hemoglobin A1C  Recent Labs  03/01/16 0104  HGBA1C 5.3   Fasting Lipid Panel  Recent Labs  03/01/16 0448  CHOL 165  HDL 55  LDLCALC 93  TRIG 85  CHOLHDL 3.0   Thyroid Function Tests No results for input(s): TSH, T4TOTAL, T3FREE, THYROIDAB in the last 72 hours.  Invalid input(s): FREET3  TELE  Sinus rhythm with rate of 50s  Radiology/Studies  Dg Chest 2 View  02/29/2016  CLINICAL DATA:  Chest pain for 4 days. EXAM: CHEST  2 VIEW COMPARISON:  02/04/2011 FINDINGS: The heart size and mediastinal contours are within normal limits. Both lungs are clear. The visualized skeletal  structures are unremarkable. IMPRESSION: No active cardiopulmonary disease. Electronically Signed   By: Ellery Plunkaniel R Mitchell M.D.   On: 02/29/2016 22:30    ASSESSMENT AND PLAN  1. NSTEMI (non-ST elevated myocardial infarction) (HCC) - Peak of troponin was 0.48 then trended down. s/p DES LAD, mild-mod dz to be treated medically. Recommended re-look cath in 6-12 months. Ambulating well.  - Continue ASA, Brillinta, metoprolol, statin, and losartan.   2 hypertension - Stable. Continue current regimen  Verapamil was d/cd  3 hyperlipidemia - 03/01/2016: Cholesterol 165; HDL 55; LDL Cholesterol 93; Triglycerides 85; VLDL 17  -Continue statin.    Signed, Bhagat,Bhavinkumar PA-C Pager [phone]  Pt seen and examined  Lungs CTA  Cardiac RRR  No S3  Ext without edema  Doing well post procedure  PLan for d/c today.  Dietrich PatesPaula Dachelle Molzahn

## 2016-03-05 NOTE — Telephone Encounter (Signed)
Patient contacted regarding discharge from [facility] on 03/03/16.  Patient understands to follow up with provider R. Barrett, PA on 03/10/16 at 10am at [facility]. Patient understands discharge instructions? YES Patient understands medications and regiment? YES Patient understands to bring all medications to this visit? YES  No questions/concerns. Patient is feeling great since discharge.

## 2016-03-10 ENCOUNTER — Ambulatory Visit (INDEPENDENT_AMBULATORY_CARE_PROVIDER_SITE_OTHER): Payer: Medicare Other | Admitting: Physician Assistant

## 2016-03-10 ENCOUNTER — Encounter: Payer: Self-pay | Admitting: Physician Assistant

## 2016-03-10 VITALS — BP 92/70 | HR 50 | Ht 75.0 in | Wt 206.0 lb

## 2016-03-10 DIAGNOSIS — I214 Non-ST elevation (NSTEMI) myocardial infarction: Secondary | ICD-10-CM

## 2016-03-10 DIAGNOSIS — K219 Gastro-esophageal reflux disease without esophagitis: Secondary | ICD-10-CM | POA: Diagnosis not present

## 2016-03-10 DIAGNOSIS — E785 Hyperlipidemia, unspecified: Secondary | ICD-10-CM | POA: Diagnosis not present

## 2016-03-10 MED ORDER — METOPROLOL TARTRATE 25 MG PO TABS: 12.5000 mg | ORAL_TABLET | Freq: Two times a day (BID) | ORAL | Status: DC

## 2016-03-10 MED ORDER — LOSARTAN POTASSIUM 50 MG PO TABS: 25.0000 mg | ORAL_TABLET | Freq: Every day | ORAL | Status: DC

## 2016-03-10 NOTE — Progress Notes (Signed)
Cardiology Office Note   Date:  03/10/2016   ID:  Louis MornLawrence E Scullin, DOB 10/20/1951, MRN [id number]  PCP:  Delorse LekBURNETT,BRENT A, MD  Cardiologist:  Dr Weston AnnaHilty  Barrett, Rhonda, PA-C   Chief Complaint  Patient presents with  . Follow-up    post hospital    History of Present Illness: Louis Perez is a 65 y.o. male with a history of HTN, HLD, Gilbert's dz.  D/C 03/21 after NSTEMI w/ DES oLAD, 25-30% lesions in multiple other vessels. EF nl by echo  Louis MornLawrence E Curd presents for post hospital follow-up.  Since discharge from the hospital,He has had a couple of 20's of chest pain, nothing serious. He has been trying to increase his activity, but is been feeling tired. He notes that his systolic blood pressure has been between 90 and 110.  He has been a little weak and tired and wonders if this is from the medications. He has had rare feelings of being lightheaded, but has not felt in danger of losing consciousness or falling.  He has several questions written down on a piece of paper. They include things such as when he can go back to work, when he can play golf again, when he can do things around the house such as washing the car, and in general he wants to know when he can resume normal activities.  He is eating healthier and this lost some weight. He is concerned because he does not want to lose any more weight and wonders how he can continue to do what he needs to do from a heart standpoint but not drop anymore weight.  He has a history of GERD symptoms which were previously controlled on low-dose Prilosec taken twice a day. However, that dose of Prilosec is no longer working since he was started on Brilinta. He is not having severe symptoms, but is having more indigestion and belching a great deal.   Past Medical History  Diagnosis Date  . Hypertension   . CVS disease   . Hyperlipidemia   . Gilbert's disease   . Unspecified septicemia   . 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 Atypical facial pain   . Unspecified tinnitus     right  . Asthma     Past Surgical History  Procedure Laterality Date  . Neck surgery    . Back surgery    . Nasal sinus surgery  08/2011  . Knee surgery Bilateral 2005    replacement,partial knee replacement because of bacterial infection-2012  . Cardiovascular stress test  05/26/2012    EKG negative for ischemia, no ECG changes, no significant ischemia noted  . Transthoracic echocardiogram  05/26/2012    EF >55%, mild concentric LVH  . Cardiac catheterization N/A 03/01/2016    Procedure: Left Heart Cath and Coronary Angiography;  Surgeon: Tonny BollmanMichael Cooper, MD;  oLAD 95%, CFX/RI/RCA/RPDA 25-30%  . Cardiac catheterization N/A 03/01/2016    Procedure: Coronary Stent Intervention;  Surgeon: Tonny BollmanMichael Cooper, MD;  Promus Premier 4.0 x  12 mm DES to oLAD    Current Outpatient Prescriptions  Medication Sig Dispense Refill  . albuterol (PROAIR HFA) 108 (90 BASE) MCG/ACT inhaler 2 puffs every 4 hours as needed only  if your can't catch your breath 1 Inhaler 1  . aspirin 81 MG tablet Take 81 mg by mouth daily.    Marland Kitchen. atorvastatin (LIPITOR) 80 MG tablet Take 1 tablet (80 mg total) by mouth daily at 6 PM. 30  tablet 30  . cetirizine (ZYRTEC) 10 MG tablet Take 10 mg by mouth daily.    . fluticasone (FLONASE) 50 MCG/ACT nasal spray Place 2 sprays into both nostrils daily.    . Garlic 1000 MG CAPS Take by mouth daily.    Marland Kitchen losartan (COZAAR) 50 MG tablet Take 1 tablet (50 mg total) by mouth daily. 30 tablet 11  . metoprolol tartrate (LOPRESSOR) 25 MG tablet Take 1 tablet (25 mg total) by mouth 2 (two) times daily. 60 tablet 6  . mometasone-formoterol (DULERA) 200-5 MCG/ACT AERO Take 2 puffs first thing in am and then another 2 puffs about 12 hours later. 1 Inhaler 11  . Multiple Vitamin (MULTIVITAMIN WITH MINERALS) TABS tablet Take 1 tablet by mouth daily.    . nitroGLYCERIN (NITROSTAT) 0.4 MG SL tablet Place 1 tablet (0.4 mg total) under the  tongue every 5 (five) minutes x 3 doses as needed for chest pain. 25 tablet 12  . omeprazole (PRILOSEC) 40 MG capsule Take 40 mg by mouth daily.    . ticagrelor (BRILINTA) 90 MG TABS tablet Take 1 tablet (90 mg total) by mouth 2 (two) times daily. 60 tablet 11   No current facility-administered medications for this visit.    Allergies:   Oxycontin; Percocet; and Ambien    Social History:  The patient  reports that he quit smoking about 47 years ago. His smoking use included Cigarettes. He quit after 3 years of use. He has never used smokeless tobacco. He reports that he drinks alcohol. He reports that he does not use illicit drugs.   Family History:  The patient's family history includes Alzheimer's disease in his father; Dementia in his father; Heart disease in his mother; Heart disease (age of onset: 110) in his brother.    ROS:  Please see the history of present illness. All other systems are reviewed and negative.    PHYSICAL EXAM: VS:  BP 92/70 mmHg  Pulse 50  Ht  (1.905 m)  Wt 206 lb (93.441 kg)  BMI 25.75 kg/m2  SpO2 97% , BMI Body mass index is 25.75 kg/(m^2). GEN: Well nourished, well developed, male in no acute distress HEENT: normal for age  Neck: no JVD, no carotid bruit, no masses Cardiac: RRR; no murmur, no rubs, or gallops Respiratory:  Mainly clear bilaterally, normal work of breathing, slight squeaky wheeze GI: soft, nontender, nondistended, + BS MS: no deformity or atrophy; no edema; distal pulses are 2+ in all 4 extremities  Skin: warm and dry, no rash Neuro:  Strength and sensation are intact Psych: euthymic mood, full affect   EKG:  EKG is not ordered today.  Recent Labs: 03/01/2016: B Natriuretic Peptide 51.0; Magnesium 1.9 03/02/2016: ALT 18; BUN 12; Creatinine, Ser 0.75; Hemoglobin 16.1; Platelets 219; Potassium 3.7; Sodium 139    Lipid Panel    Component Value Date/Time   CHOL 165 03/01/2016 0448   TRIG 85 03/01/2016 0448   HDL 55 03/01/2016  0448   CHOLHDL 3.0 03/01/2016 0448   VLDL 17 03/01/2016 0448   LDLCALC 93 03/01/2016 0448     Wt Readings from Last 3 Encounters:  03/10/16 206 lb (93.441 kg)  03/03/16 203 lb 7.8 oz (92.3 kg)  08/22/15 221 lb (100.245 kg)     Other studies Reviewed: Additional studies/ records that were reviewed today include: Hospital records and testing.  ASSESSMENT AND PLAN:  1.  Non-STEMI, subsequent episode of care: He is on good medical therapy with aspirin 81  mg, Lipitor 80 mg, Cozaar 50 mg and metoprolol 25 mg twice a day. However, his blood pressure is running low and he feels draggy and tired. He has also had some orthostatic dizziness.   Will decrease both the Cozaar and the metoprolol by half. Hopefully at those doses, his blood pressure will float a litle higher and he will be able to recover fully. He is strongly encouraged to attend cardiac rehabilitation. He has requested not to play golf, travel using a camper, or wash the car by hand until he has followed up with them.  2. Weight loss: His weight is at its baseline and he feels this weight is good for him. Advised him that he can increase his by mouth intake of the heart healthy foods to make sure that he does not lose any more weight. Advised him that it was okay to eat one egg a day and additional equines if he desires. Advised him that occasional baked chicken wings or other dishes that he likes, prepared in a low-fat way, are okay.  3. Dyslipidemia: He has a history of Gilbert's disease. His bilirubin was 1.8 with indirect bilirubin of 1.6. He may need to decrease the intensity of the [place] sooner rather than later. Recheck LFTs and lipid profile at his follow-up office visit.  4. GERD: He has had marked GERD symptoms since being started on the Brilinta. He is currently taking Prilosec 20 mg twice a day. He is encouraged to increase this to 40 mg twice a day and follow-up with primary care if this is not adequate.   Current  medicines are reviewed at length with the patient today.  The patient has concerns regarding medicines. Concerns were addressed  The following changes have been made:  Decrease Cozaar and metoprolol  Labs/ tests ordered today include:   Orders Placed This Encounter  Procedures  . Lipid panel  . Hepatic function panel     Disposition:   FU with Dr. Rennis Golden  Signed, Leanna Battles  03/10/2016 12:39 PM    [facility] Phone: [phone]; Fax: [phone]  This note was written with the assistance of speech recognition software. Please excuse any transcriptional errors.

## 2016-03-10 NOTE — Patient Instructions (Signed)
Medication Instructions:  Your physician has recommended you make the following change in your medication:  1) REDUCE Metoprolol to 12.5mg  Twice daily 2) REDUCE Cozaar to 25mg  daily 3) INCREASE Prilosec to 40mg  twice daily  Labwork: Your physician recommends that you return for a FASTING lipid profile ad lft in 3 months   Testing/Procedures: None ordered  Follow-Up: Your physician recommends that you schedule a follow-up appointment in: 3 months with Dr.[name]   Any Other Special Instructions Will Be Listed Below (If Applicable).     If you need a refill on your cardiac medications before your next appointment, please call your pharmacy.

## 2016-03-11 ENCOUNTER — Telehealth: Payer: Self-pay | Admitting: Internal Medicine

## 2016-03-11 NOTE — Telephone Encounter (Signed)
°  New Prob   Pt has some questions regarding cardiac rehab. Please call.

## 2016-03-11 NOTE — Telephone Encounter (Signed)
Pt called stated that he cannot start Cardiac Rehab at [facility] until 4/25.  Pt stated he is [facility] to [facility]; paperwork completed and faxed to [facility]. Pt aware they will be calling him to setup times.

## 2016-04-07 ENCOUNTER — Encounter (HOSPITAL_COMMUNITY)
Admission: RE | Admit: 2016-04-07 | Discharge: 2016-04-07 | Disposition: A | Discharge status: Home / Self Care | Payer: Medicare Other | Source: Ambulatory Visit | Attending: Internal Medicine | Admitting: Internal Medicine

## 2016-04-07 ENCOUNTER — Encounter (HOSPITAL_COMMUNITY): Payer: Self-pay

## 2016-04-07 VITALS — BP 134/82 | HR 57 | Ht 75.0 in | Wt 206.7 lb

## 2016-04-07 DIAGNOSIS — Z955 Presence of coronary angioplasty implant and graft: Secondary | ICD-10-CM

## 2016-04-07 DIAGNOSIS — I214 Non-ST elevation (NSTEMI) myocardial infarction: Secondary | ICD-10-CM | POA: Diagnosis not present

## 2016-04-07 NOTE — Patient Instructions (Signed)
Patient arrived for 1st visit/orientation/education at [id number]. Patient was referred to CR by Dr. Rennis GoldenHilty due to MI (I21.4) and stent (Z95.5). During orientation advised patient on arrival and appointment times what to wear, what to do before, during and after exercise. Reviewed attendance and class policy. Talked about inclement weather and class consultation policy. Pt is scheduled to return Cardiac Rehab on 04/13/16 at 0815. Pt was advised to come to class 15 minutes before class starts. He was also given instructions on meeting with the dietician and attending the Family Structure classes.  Patient entrance PHQ score is 0. Pt is eager to get started. Patient was able to complete 6 minute walk test. Patient was measured for the equipment. Discussed equipment safety with patient. Took patient pre-anthropometric measurements. Patient finished visit at 0900. If the patient is not going to attend class, he has been instructed to call.

## 2016-04-07 NOTE — Progress Notes (Signed)
Cardiac Individual Treatment Plan  Patient Details  Name: Louis Perez MRN: [id number] Date of Birth: Jun 26, 1951 Referring Provider:        CARDIAC REHAB PHASE II ORIENTATION from 04/07/2016 in [facility] CARDIAC REHABILITATION   Referring Provider  Dr. Rennis Golden      Initial Encounter Date:       CARDIAC REHAB PHASE II ORIENTATION from 04/07/2016 in [place] CARDIAC REHABILITATION   Date  04/07/16   Referring Provider  Dr. Rennis Golden      Visit Diagnosis: Stented coronary artery  NSTEMI (non-ST elevation myocardial infarction) [facility])  Patient's Home Medications on Admission:  Current outpatient prescriptions:  .  albuterol (PROAIR HFA) 108 (90 BASE) MCG/ACT inhaler, 2 puffs every 4 hours as needed only  if your can't catch your breath, Disp: 1 Inhaler, Rfl: 1 .  aspirin 81 MG tablet, Take 81 mg by mouth daily., Disp: , Rfl:  .  atorvastatin (LIPITOR) 80 MG tablet, Take 1 tablet (80 mg total) by mouth daily at 6 PM., Disp: 30 tablet, Rfl: 30 .  cetirizine (ZYRTEC) 10 MG tablet, Take 10 mg by mouth daily., Disp: , Rfl:  .  fluticasone (FLONASE) 50 MCG/ACT nasal spray, Place 2 sprays into both nostrils daily., Disp: , Rfl:  .  Garlic 1000 MG CAPS, Take by mouth daily., Disp: , Rfl:  .  losartan (COZAAR) 50 MG tablet, Take 0.5 tablets (25 mg total) by mouth daily., Disp: , Rfl:  .  metoprolol tartrate (LOPRESSOR) 25 MG tablet, Take 0.5 tablets (12.5 mg total) by mouth 2 (two) times daily., Disp: , Rfl:  .  mometasone-formoterol (DULERA) 200-5 MCG/ACT AERO, Take 2 puffs first thing in am and then another 2 puffs about 12 hours later., Disp: 1 Inhaler, Rfl: 11 .  Multiple Vitamin (MULTIVITAMIN WITH MINERALS) TABS tablet, Take 1 tablet by mouth daily., Disp: , Rfl:  .  nitroGLYCERIN (NITROSTAT) 0.4 MG SL tablet, Place 1 tablet (0.4 mg total) under the tongue every 5 (five) minutes x 3 doses as needed for chest pain., Disp: 25 tablet, Rfl: 12 .  omeprazole (PRILOSEC) 40 MG capsule, Take 20 mg  by mouth 2 (two) times daily., Disp: , Rfl:  .  ticagrelor (BRILINTA) 90 MG TABS tablet, Take 1 tablet (90 mg total) by mouth 2 (two) times daily., Disp: 60 tablet, Rfl: 11  Past Medical History: Past Medical History  Diagnosis Date  . Hypertension   . CVS disease   . Hyperlipidemia   . Gilbert's disease   . Unspecified septicemia   . 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 Atypical facial pain   . Unspecified tinnitus     right  . Asthma     Tobacco Use: History  Smoking status  . Former Smoker -- 3 years  . Types: Cigarettes  . Quit date: 12/14/1968  Smokeless tobacco  . Never Used    Comment: Quit in 1970    Labs:     Recent Review Flowsheet Data    Labs for ITP Cardiac and Pulmonary Rehab Latest Ref Rng 03/01/2016   Cholestrol 0 - 200 mg/dL 409   LDLCALC 0 - 99 mg/dL 93   HDL >81 mg/dL 55   Trlycerides <191 mg/dL 85   Hemoglobin Y7W 4.8 - 5.6 % 5.3      Capillary Blood Glucose: No results found for: GLUCAP   Exercise Target Goals: Date: 04/07/16  Exercise Program Goal: Individual exercise prescription set with THRR, safety & activity barriers.  Participant demonstrates ability to understand and report RPE using BORG scale, to self-measure pulse accurately, and to acknowledge the importance of the exercise prescription.  Exercise Prescription Goal: Starting with aerobic activity 30 plus minutes a day, 3 days per week for initial exercise prescription. Provide home exercise prescription and guidelines that participant acknowledges understanding prior to discharge.  Activity Barriers & Risk Stratification:     Activity Barriers & Cardiac Risk Stratification - 04/07/16 0900    Activity Barriers & Cardiac Risk Stratification   Activity Barriers None   Cardiac Risk Stratification High      6 Minute Walk:     6 Minute Walk      04/07/16 1636       6 Minute Walk   Phase Initial     Distance 1550 feet     Walk Time 6 minutes     # of Rest Breaks  0     MPH 2.93     METS 3.25     RPE 9     Perceived Dyspnea  9     VO2 Peak 14.1     Symptoms No     Resting HR 57 bpm     Resting BP 134/82 mmHg     Max Ex. HR 108 bpm     Max Ex. BP 146/92 mmHg     2 Minute Post BP 110/72 mmHg        Initial Exercise Prescription:     Initial Exercise Prescription - 04/07/16 0750    Date of Initial Exercise RX and Referring Provider   Date 04/07/16   Referring Provider Dr. Rennis Golden   Treadmill   MPH 2   Grade 0   Minutes 15   METs 2.5   NuStep   Level 2   Minutes 15   METs 2   Elliptical   Level 1   Speed 1   Minutes 15   METs 1.5   Prescription Details   Frequency (times per week) 3   Duration Progress to 30 minutes of continuous aerobic without signs/symptoms of physical distress   Intensity   THRR REST +  30   THRR 40-80% of Max Heartrate [phone]   Ratings of Perceived Exertion 11-13   Perceived Dyspnea 0-4   Progression   Progression Continue progressive overload as per policy without signs/symptoms or physical distress.   Resistance Training   Training Prescription Yes   Weight 1   Reps 10-12      Perform Capillary Blood Glucose checks as needed.  Exercise Prescription Changes:   Exercise Comments:    Discharge Exercise Prescription (Final Exercise Prescription Changes):   Nutrition:  Target Goals: Understanding of nutrition guidelines, daily intake of sodium 1500mg , cholesterol 200mg , calories 30% from fat and 7% or less from saturated fats, daily to have 5 or more servings of fruits and vegetables.  Biometrics:     Pre Biometrics - 04/09/16 1648    Pre Biometrics   Height 6\' 3"  (1.905 m)   Weight 206 lb 11.2 oz (93.759 kg)   Waist Circumference 38.5 inches   Hip Circumference 40.5 inches   Waist to Hip Ratio 0.95 %   BMI (Calculated) 25.9   Triceps Skinfold 4 mm   % Body Fat 20.2 %   Grip Strength 84.3 kg   Flexibility 10.7 in   Single Leg Stand 60 seconds       Nutrition Therapy Plan  and Nutrition Goals:     Nutrition Therapy &  Goals - 04/07/16 0904    Intervention Plan   Intervention Nutrition handout(s) given to patient.   Expected Outcomes Short Term Goal: Understand basic principles of dietary content, such as calories, fat, sodium, cholesterol and nutrients.      Nutrition Discharge: Rate Your Plate Scores:     Nutrition Assessments - 04/07/16 0905    MEDFICTS Scores   Pre Score 3      Nutrition Goals Re-Evaluation:   Psychosocial: Target Goals: Acknowledge presence or absence of depression, maximize coping skills, provide positive support system. Participant is able to verbalize types and ability to use techniques and skills needed for reducing stress and depression.  Initial Review & Psychosocial Screening:     Initial Psych Review & Screening - 04/07/16 1624    Family Dynamics   Good Support System? Yes   Barriers   Psychosocial barriers to participate in program There are no identifiable barriers or psychosocial needs.   Screening Interventions   Interventions Encouraged to exercise      Quality of Life Scores:     Quality of Life - 04/07/16 1651    Quality of Life Scores   Health/Function Pre 22.4 %   Socioeconomic Pre 29.25 %   Psych/Spiritual Pre 24 %   Family Pre 25.2 %   GLOBAL Pre 24.69 %      PHQ-9:     Recent Review Flowsheet Data    [facility]   Decreased Interest 0   Down, Depressed, Hopeless 0   PHQ - 2 Score 0   Altered sleeping 0   Tired, decreased energy 0   Change in appetite 0   Feeling bad or failure about yourself  0   Trouble concentrating 0   Moving slowly or fidgety/restless 0   Suicidal thoughts 0   PHQ-9 Score 0      Psychosocial Evaluation and Intervention:     Psychosocial Evaluation - 04/07/16 1624    Psychosocial Evaluation & Interventions   Interventions Stress management education;Relaxation education;Encouraged to exercise with the program and follow exercise  prescription   Continued Psychosocial Services Needed No      Psychosocial Re-Evaluation:   Vocational Rehabilitation: Provide vocational rehab assistance to qualifying candidates.   Vocational Rehab Evaluation & Intervention:     Vocational Rehab - 04/07/16 0901    Initial Vocational Rehab Evaluation & Intervention   Assessment shows need for Vocational Rehabilitation No      Education: Education Goals: Education classes will be provided on a weekly basis, covering required topics. Participant will state understanding/return demonstration of topics presented.  Learning Barriers/Preferences:     Learning Barriers/Preferences - 04/07/16 0900    Learning Barriers/Preferences   Learning Barriers None   Learning Preferences Skilled Demonstration      Education Topics: Hypertension, Hypertension Reduction -Define heart disease and high blood pressure. Discus how high blood pressure affects the body and ways to reduce high blood pressure.   Exercise and Your Heart -Discuss why it is important to exercise, the FITT principles of exercise, normal and abnormal responses to exercise, and how to exercise safely.   Angina -Discuss definition of angina, causes of angina, treatment of angina, and how to decrease risk of having angina.   Cardiac Medications -Review what the following cardiac medications are used for, how they affect the body, and side effects that may occur when taking the medications.  Medications include Aspirin, Beta blockers, calcium channel blockers, ACE Inhibitors, angiotensin receptor blockers, diuretics, digoxin, and  antihyperlipidemics.   Congestive Heart Failure -Discuss the definition of CHF, how to live with CHF, the signs and symptoms of CHF, and how keep track of weight and sodium intake.   Heart Disease and Intimacy -Discus the effect sexual activity has on the heart, how changes occur during intimacy as we age, and safety during sexual  activity.   Smoking Cessation / COPD -Discuss different methods to quit smoking, the health benefits of quitting smoking, and the definition of COPD.   Nutrition I: Fats -Discuss the types of cholesterol, what cholesterol does to the heart, and how cholesterol levels can be controlled.   Nutrition II: Labels -Discuss the different components of food labels and how to read food label   Heart Parts and Heart Disease -Discuss the anatomy of the heart, the pathway of blood circulation through the heart, and these are affected by heart disease.   Stress I: Signs and Symptoms -Discuss the causes of stress, how stress may lead to anxiety and depression, and ways to limit stress.   Stress II: Relaxation -Discuss different types of relaxation techniques to limit stress.   Warning Signs of Stroke / TIA -Discuss definition of a stroke, what the signs and symptoms are of a stroke, and how to identify when someone is having stroke.   Knowledge Questionnaire Score:     Knowledge Questionnaire Score - 04/07/16 0901    Knowledge Questionnaire Score   Pre Score 23/24      Core Components/Risk Factors/Patient Goals at Admission:     Personal Goals and Risk Factors at Admission - 04/07/16 0905    Core Components/Risk Factors/Patient Goals on Admission    Weight Management Weight Maintenance   Increase Strength and Stamina Yes   Intervention Provide advice, education, support and counseling about physical activity/exercise needs.   Expected Outcomes Achievement of increased cardiorespiratory fitness and enhanced flexibility, muscular endurance and strength shown through measurements of functional capacity and personal statement of participant.   Hypertension Yes   Intervention Provide education on lifestyle modifcations including regular physical activity/exercise, weight management, moderate sodium restriction and increased consumption of fresh fruit, vegetables, and low fat dairy,  alcohol moderation, and smoking cessation.;Monitor prescription use compliance.   Expected Outcomes Long Term: Maintenance of blood pressure at goal levels.   Lipids Yes   Intervention Provide education and support for participant on nutrition & aerobic/resistive exercise along with prescribed medications to achieve LDL 70mg , HDL >40mg .   Expected Outcomes Long Term: Cholesterol controlled with medications as prescribed, with individualized exercise RX and with personalized nutrition plan. Value goals: LDL < , HDL > 40 mg.      Core Components/Risk Factors/Patient Goals Review:      Goals and Risk Factor Review      04/07/16 1624           Core Components/Risk Factors/Patient Goals Review   Personal Goals Review Increase Strength and Stamina;Hypertension;Lipids          Core Components/Risk Factors/Patient Goals at Discharge (Final Review):      Goals and Risk Factor Review - 04/07/16 1624    Core Components/Risk Factors/Patient Goals Review   Personal Goals Review Increase Strength and Stamina;Hypertension;Lipids      ITP Comments:     ITP Comments      04/07/16 0901           ITP Comments patient is a 65 year old that lives with wife and enjoys playing golf play with old cars.  Patient shows no  s/s of depression.           Comments: Patient arrived for 1st visit/orientation/education at 42. Patient was referred to CR by Dr. Rennis Golden due to MI (I21.4) and stent (Z95.5). During orientation advised patient on arrival and appointment times what to wear, what to do before, during and after exercise. Reviewed attendance and class policy. Talked about inclement weather and class consultation policy. Pt is scheduled to return Cardiac Rehab on 04/13/16 at 0815. Pt was advised to come to class 15 minutes before class starts. He was also given instructions on meeting with the dietician and attending the Family Structure classes.  Patient entrance PHQ score is 0. Pt is eager to  get started. Patient was able to complete 6 minute walk test. Patient was measured for the equipment. Discussed equipment safety with patient. Took patient pre-anthropometric measurements. Patient finished visit at 0900. If the patient is not going to attend class, he has been instructed to call.

## 2016-04-07 NOTE — Progress Notes (Signed)
Cardiac/Pulmonary Rehab Medication Review by a Pharmacist  Does the patient  feel that his/her medications are working for him/her?  yes  Has the patient been experiencing any side effects to the medications prescribed?  no  Does the patient measure his/her own blood pressure or blood glucose at home?  yes   Does the patient have any problems obtaining medications due to transportation or finances?   yes  Understanding of regimen: excellent Understanding of indications: good Potential of compliance: good   Pharmacist comments: Ed feels his medications are working for him.  He states he is sometimes tired.  He checks his blood pressure at home about 3 times daily.  He uses a pill box to help remind him of when to take medications.   Talbert CageSeay, Eino Whitner Poteet 04/07/2016 9:18 AM

## 2016-04-13 ENCOUNTER — Encounter (HOSPITAL_COMMUNITY)
Admission: RE | Admit: 2016-04-13 | Discharge: 2016-04-13 | Disposition: A | Discharge status: Home / Self Care | Payer: Medicare Other | Source: Ambulatory Visit | Attending: Internal Medicine | Admitting: Internal Medicine

## 2016-04-13 DIAGNOSIS — I214 Non-ST elevation (NSTEMI) myocardial infarction: Secondary | ICD-10-CM | POA: Insufficient documentation

## 2016-04-15 ENCOUNTER — Encounter (HOSPITAL_COMMUNITY)
Admission: RE | Admit: 2016-04-15 | Discharge: 2016-04-15 | Disposition: A | Discharge status: Home / Self Care | Payer: Medicare Other | Source: Ambulatory Visit | Attending: Internal Medicine | Admitting: Internal Medicine

## 2016-04-15 DIAGNOSIS — I214 Non-ST elevation (NSTEMI) myocardial infarction: Secondary | ICD-10-CM | POA: Diagnosis not present

## 2016-04-17 ENCOUNTER — Encounter (HOSPITAL_COMMUNITY)
Admission: RE | Admit: 2016-04-17 | Discharge: 2016-04-17 | Disposition: A | Discharge status: Home / Self Care | Payer: Medicare Other | Source: Ambulatory Visit | Attending: Internal Medicine | Admitting: Internal Medicine

## 2016-04-17 DIAGNOSIS — I214 Non-ST elevation (NSTEMI) myocardial infarction: Secondary | ICD-10-CM | POA: Diagnosis not present

## 2016-04-20 ENCOUNTER — Encounter (HOSPITAL_COMMUNITY)
Admission: RE | Admit: 2016-04-20 | Discharge: 2016-04-20 | Disposition: A | Discharge status: Home / Self Care | Payer: Medicare Other | Source: Ambulatory Visit | Attending: Internal Medicine | Admitting: Internal Medicine

## 2016-04-20 ENCOUNTER — Other Ambulatory Visit: Payer: Self-pay | Admitting: *Deleted

## 2016-04-20 DIAGNOSIS — I214 Non-ST elevation (NSTEMI) myocardial infarction: Secondary | ICD-10-CM | POA: Diagnosis not present

## 2016-04-20 MED ORDER — OMEPRAZOLE 40 MG PO CPDR: 40.0000 mg | DELAYED_RELEASE_CAPSULE | Freq: Two times a day (BID) | ORAL | Status: DC

## 2016-04-20 NOTE — Telephone Encounter (Signed)
Rx request sent to pharmacy.  

## 2016-04-22 ENCOUNTER — Encounter (HOSPITAL_COMMUNITY)
Admission: RE | Admit: 2016-04-22 | Discharge: 2016-04-22 | Disposition: A | Discharge status: Home / Self Care | Payer: Medicare Other | Source: Ambulatory Visit | Attending: Internal Medicine | Admitting: Internal Medicine

## 2016-04-22 DIAGNOSIS — I214 Non-ST elevation (NSTEMI) myocardial infarction: Secondary | ICD-10-CM | POA: Diagnosis not present

## 2016-04-24 ENCOUNTER — Encounter (HOSPITAL_COMMUNITY)
Admission: RE | Admit: 2016-04-24 | Discharge: 2016-04-24 | Disposition: A | Discharge status: Home / Self Care | Payer: Medicare Other | Source: Ambulatory Visit | Attending: Internal Medicine | Admitting: Internal Medicine

## 2016-04-24 DIAGNOSIS — I214 Non-ST elevation (NSTEMI) myocardial infarction: Secondary | ICD-10-CM | POA: Diagnosis not present

## 2016-04-27 ENCOUNTER — Encounter (HOSPITAL_COMMUNITY)
Admission: RE | Admit: 2016-04-27 | Discharge: 2016-04-27 | Disposition: A | Discharge status: Home / Self Care | Payer: Medicare Other | Source: Ambulatory Visit | Attending: Internal Medicine | Admitting: Internal Medicine

## 2016-04-27 DIAGNOSIS — I214 Non-ST elevation (NSTEMI) myocardial infarction: Secondary | ICD-10-CM | POA: Diagnosis not present

## 2016-04-29 ENCOUNTER — Encounter (HOSPITAL_COMMUNITY)
Admission: RE | Admit: 2016-04-29 | Discharge: 2016-04-29 | Disposition: A | Discharge status: Home / Self Care | Payer: Medicare Other | Source: Ambulatory Visit | Attending: Internal Medicine | Admitting: Internal Medicine

## 2016-04-29 DIAGNOSIS — I214 Non-ST elevation (NSTEMI) myocardial infarction: Secondary | ICD-10-CM | POA: Diagnosis not present

## 2016-05-01 ENCOUNTER — Encounter (HOSPITAL_COMMUNITY)
Admission: RE | Admit: 2016-05-01 | Discharge: 2016-05-01 | Disposition: A | Discharge status: Home / Self Care | Payer: Medicare Other | Source: Ambulatory Visit | Attending: Internal Medicine | Admitting: Internal Medicine

## 2016-05-01 DIAGNOSIS — I214 Non-ST elevation (NSTEMI) myocardial infarction: Secondary | ICD-10-CM | POA: Diagnosis not present

## 2016-05-04 ENCOUNTER — Encounter (HOSPITAL_COMMUNITY)
Admission: RE | Admit: 2016-05-04 | Discharge: 2016-05-04 | Disposition: A | Discharge status: Home / Self Care | Payer: Medicare Other | Source: Ambulatory Visit | Attending: Internal Medicine | Admitting: Internal Medicine

## 2016-05-04 DIAGNOSIS — I214 Non-ST elevation (NSTEMI) myocardial infarction: Secondary | ICD-10-CM | POA: Diagnosis not present

## 2016-05-06 ENCOUNTER — Encounter (HOSPITAL_COMMUNITY)
Admission: RE | Admit: 2016-05-06 | Discharge: 2016-05-06 | Disposition: A | Discharge status: Home / Self Care | Payer: Medicare Other | Source: Ambulatory Visit | Attending: Internal Medicine | Admitting: Internal Medicine

## 2016-05-06 DIAGNOSIS — I214 Non-ST elevation (NSTEMI) myocardial infarction: Secondary | ICD-10-CM | POA: Diagnosis not present

## 2016-05-06 NOTE — Progress Notes (Signed)
Daily Session Note  Patient Details  Name: Louis Perez MRN: [id number] Date of Birth: 02-13-1951 Referring Provider:        CARDIAC REHAB PHASE II ORIENTATION from 04/07/2016 in [place]   Referring Provider  Dr. Debara Pickett      Encounter Date: 05/06/2016  Check In:     Session Check In - 05/06/16 0815    Check-In   Location [facility]-Cardiac & Pulmonary Rehab   Staff Present Russella Dar, MS, EP, [facility], Exercise Physiologist;Christy Oletta Lamas, RN, BSN;Other   Supervising physician immediately available to respond to emergencies See telemetry face sheet for immediately available MD   Medication changes reported     No   Fall or balance concerns reported    No   Warm-up and Cool-down Performed as group-led instruction   Resistance Training Performed Yes   VAD Patient? No   Pain Assessment   Currently in Pain? No/denies   Multiple Pain Sites No      Capillary Blood Glucose: No results found for this or any previous visit (from the past 24 hour(s)).   Goals Met:  Proper associated with RPD/PD & O2 Sat Independence with exercise equipment Improved SOB with ADL's Exercise tolerated well No report of cardiac concerns or symptoms Strength training completed today  Goals Unmet:  RPE BP HR  Comments: Patient is progressing appropriately.    Dr. Kate Sable is Medical Director for [facility] Cardiac and Pulmonary Rehab.

## 2016-05-08 ENCOUNTER — Encounter (HOSPITAL_COMMUNITY)
Admission: RE | Admit: 2016-05-08 | Discharge: 2016-05-08 | Disposition: A | Discharge status: Home / Self Care | Payer: Medicare Other | Source: Ambulatory Visit | Attending: Internal Medicine | Admitting: Internal Medicine

## 2016-05-08 DIAGNOSIS — I214 Non-ST elevation (NSTEMI) myocardial infarction: Secondary | ICD-10-CM

## 2016-05-08 DIAGNOSIS — Z955 Presence of coronary angioplasty implant and graft: Secondary | ICD-10-CM

## 2016-05-08 NOTE — Progress Notes (Signed)
Daily Session Note  Patient Details  Name: Louis Perez MRN: [id number] Date of Birth: 09/18/1951 Referring Provider:        CARDIAC REHAB PHASE II ORIENTATION from 04/07/2016 in [place]   Referring Provider  Dr. Debara Pickett      Encounter Date: 05/08/2016  Check In:     Session Check In - 05/08/16 0815    Check-In   Location [facility]   Staff Present Russella Dar, MS, EP, [facility], Exercise Physiologist;Christy Oletta Lamas, RN, BSN;Other  Nils Flack, EP   Supervising physician immediately available to respond to emergencies See telemetry face sheet for immediately available MD   Medication changes reported     No   Fall or balance concerns reported    No   Warm-up and Cool-down Performed as group-led instruction   Resistance Training Performed Yes   VAD Patient? No   Pain Assessment   Currently in Pain? No/denies   Multiple Pain Sites No      Capillary Blood Glucose: No results found for this or any previous visit (from the past 24 hour(s)).   Goals Met:  Independence with exercise equipment Improved SOB with ADL's Exercise tolerated well No report of cardiac concerns or symptoms Strength training completed today  Goals Unmet:  RPE BP HR  Comments: Patient is progressing appropriately. Check out: 0930   Dr. Kate Sable is Medical Director for [place] and Pulmonary Rehab.

## 2016-05-09 NOTE — Progress Notes (Signed)
Cardiac Individual Treatment Plan  Patient Details  Name: Louis Perez E Larsen MRN: [id number] Date of Birth: 1951/04/14 Referring Provider:        CARDIAC REHAB PHASE II ORIENTATION from 04[facility]07/2016 [facility]   Referring Provider  Dr. Rennis GoldenHilty      Initial Encounter Date:       CARDIAC REHAB PHASE II [facility]2016 [facility]   Date  04/07/16   Referring Provider  Dr. Rennis GoldenHilty      Visit Diagnosis: NSTEMI (non-ST elevation myocardial infarction) [facility](HCC)  Stented coronary artery  Patient's Home Medications on Admission:  Current outpatient prescriptions:  .  albuterol (PROAIR HFA) 108 (90 BASE) MCG/ACT inhaler, 2 puffs every 4 hours as needed only  if your can't catch your breath, Disp: 1 Inhaler, Rfl: 1 .  aspirin 81 MG tablet, Take 81 mg by mouth daily., Disp: , Rfl:  .  atorvastatin (LIPITOR) 80 MG tablet, Take 1 tablet (80 mg total) by mouth daily at 6 PM., Disp: 30 tablet, Rfl: 30 .  cetirizine (ZYRTEC) 10 MG tablet, Take 10 mg by mouth daily., Disp: , Rfl:  .  fluticasone (FLONASE) 50 MCG/ACT nasal spray, Place 2 sprays into both nostrils daily., Disp: , Rfl:  .  Garlic 1000 MG CAPS, Take by mouth daily., Disp: , Rfl:  .  losartan (COZAAR) 50 MG tablet, Take 0.5 tablets (25 mg total) by mouth daily., Disp: , Rfl:  .  metoprolol tartrate (LOPRESSOR) 25 MG tablet, Take 0.5 tablets (12.5 mg total) by mouth 2 (two) times daily., Disp: , Rfl:  .  mometasone-formoterol (DULERA) 200-5 MCG/ACT AERO, Take 2 puffs first thing in am and then another 2 puffs about 12 hours later., Disp: 1 Inhaler, Rfl: 11 .  Multiple Vitamin (MULTIVITAMIN WITH MINERALS) TABS tablet, Take 1 tablet by mouth daily., Disp: , Rfl:  .  nitroGLYCERIN (NITROSTAT) 0.4 MG SL tablet, Place 1 tablet (0.4 mg total) under the tongue every 5 (five) minutes x 3 doses as needed for chest pain., Disp: 25 tablet, Rfl: 12 .  omeprazole (PRILOSEC) 40 MG capsule, Take 1  capsule (40 mg total) by mouth 2 (two) times daily., Disp: 60 capsule, Rfl: 2 .  ticagrelor (BRILINTA) 90 MG TABS tablet, Take 1 tablet (90 mg total) by mouth 2 (two) times daily., Disp: 60 tablet, Rfl: 11  Past Medical History: Past Medical History  Diagnosis Date  . Hypertension   . CVS disease   . Hyperlipidemia   . Gilbert's disease   . Unspecified septicemia   . 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 Atypical facial pain   . Unspecified tinnitus     right  . Asthma     Tobacco Use: History  Smoking status  . Former Smoker -- 3 years  . Types: Cigarettes  . Quit date: 12/14/1968  Smokeless tobacco  . Never Used    Comment: Quit in 1970    Labs:     Recent Review [employer]sheet Data    Labs for ITP Cardiac and Pulmonary Rehab Latest Ref Rng 03/01/2016   Cholestrol 0 - 200 mg/dL [id number]   LDLCALC 0 - 99 mg/dL 93   HDL >95>40 mg/dL 55   Trlycerides <621<150 mg/dL 85   Hemoglobin H0QA1c 4.8 - 5.6 % 5.3      Capillary Blood Glucose: No results found for: GLUCAP   Exercise Target Goals:    Exercise Program Goal: Individual exercise prescription set with THRR,  safety & activity barriers. Participant demonstrates ability to understand and report RPE using BORG scale, to self-measure pulse accurately, and to acknowledge the importance of the exercise prescription.  Exercise Prescription Goal: Starting with aerobic activity 30 plus minutes a day, 3 days per week for initial exercise prescription. Provide home exercise prescription and guidelines that participant acknowledges understanding prior to discharge.  Activity Barriers & Risk Stratification:     Activity Barriers & Cardiac Risk Stratification - 04/07/16 0900    Activity Barriers & Cardiac Risk Stratification   Activity Barriers None   Cardiac Risk Stratification High      6 Minute Walk:     6 Minute Walk      04/07/16 1636       6 Minute Walk   Phase Initial     Distance 1550 feet     Walk Time 6 minutes      # of Rest Breaks 0     MPH 2.93     METS 3.25     RPE 9     Perceived Dyspnea  9     VO2 Peak 14.1     Symptoms No     Resting HR 57 bpm     Resting BP 134/82 mmHg     Max Ex. HR 108 bpm     Max Ex. BP 146/92 mmHg     2 Minute Post BP 110/72 mmHg        Initial Exercise Prescription:     Initial Exercise Prescription - 04/07/16 0750    Date of Initial Exercise RX and Referring Provider   Date 04/07/16   Referring Provider Dr. Rennis Golden   Treadmill   MPH 2   Grade 0   Minutes 15   METs 2.5   NuStep   Level 2   Minutes 15   METs 2   Elliptical   Level 1   Speed 1   Minutes 15   METs 1.5   Prescription Details   Frequency (times per week) 3   Duration Progress to 30 minutes of continuous aerobic without signs/symptoms of physical distress   Intensity   THRR REST +  30   THRR 40-80% of Max Heartrate [phone]   Ratings of Perceived Exertion 11-13   Perceived Dyspnea 0-4   Progression   Progression Continue progressive overload as per policy without signs/symptoms or physical distress.   Resistance Training   Training Prescription Yes   Weight 1   Reps 10-12      Perform Capillary Blood Glucose checks as needed.  Exercise Prescription Changes:      Exercise Prescription Changes      05/09/16 1100           Exercise Review   Progression Yes       Response to Exercise   Blood Pressure (Admit) 112/82 mmHg       Blood Pressure (Exercise) 148/92 mmHg       Blood Pressure (Exit) 108/70 mmHg       Heart Rate (Admit) 53 bpm       Heart Rate (Exercise) 84 bpm       Heart Rate (Exit) 56 bpm       Symptoms No       Duration Progress to 30 minutes of continuous aerobic without signs/symptoms of physical distress       Intensity Rest + 30       Progression   Progression Continue to progress workloads to maintain intensity  without signs/symptoms of physical distress.       Resistance Training   Training Prescription Yes       Weight 5       Reps 10-12        Interval Training   Interval Training No       Treadmill   MPH 3       Grade 0       Minutes 15       METs 3.29       Elliptical   Level 5       Speed 80       Minutes 15       METs 6.8       Home Exercise Plan   Plans to continue exercise at Home       Frequency Add 2 additional days to program exercise sessions.          Exercise Comments:      Exercise Comments      05/09/16 1200           Exercise Comments Patient is progressing appropriately.            Discharge Exercise Prescription (Final Exercise Prescription Changes):     Exercise Prescription Changes - 05/09/16 1100    Exercise Review   Progression Yes   Response to Exercise   Blood Pressure (Admit) 112/82 mmHg   Blood Pressure (Exercise) 148/92 mmHg   Blood Pressure (Exit) 108/70 mmHg   Heart Rate (Admit) 53 bpm   Heart Rate (Exercise) 84 bpm   Heart Rate (Exit) 56 bpm   Symptoms No   Duration Progress to 30 minutes of continuous aerobic without signs/symptoms of physical distress   Intensity Rest + 30   Progression   Progression Continue to progress workloads to maintain intensity without signs/symptoms of physical distress.   Resistance Training   Training Prescription Yes   Weight 5   Reps 10-12   Interval Training   Interval Training No   Treadmill   MPH 3   Grade 0   Minutes 15   METs 3.29   Elliptical   Level 5   Speed 80   Minutes 15   METs 6.8   Home Exercise Plan   Plans to continue exercise at Home   Frequency Add 2 additional days to program exercise sessions.      Nutrition:  Target Goals: Understanding of nutrition guidelines, daily intake of sodium 1500mg , cholesterol 200mg , calories 30% from fat and 7% or less from saturated fats, daily to have 5 or more servings of fruits and vegetables.  Biometrics:     Pre Biometrics - 04/09/16 1648    Pre Biometrics   Height 6\' 3"  (1.905 m)   Weight 206 lb 11.2 oz (93.759 kg)   Waist Circumference 38.5 inches    Hip Circumference 40.5 inches   Waist to Hip Ratio 0.95 %   BMI (Calculated) 25.9   Triceps Skinfold 4 mm   % Body Fat 20.2 %   Grip Strength 84.3 kg   Flexibility 10.7 in   Single Leg Stand 60 seconds       Nutrition Therapy Plan and Nutrition Goals:     Nutrition Therapy & Goals - 04/07/16 0904    Intervention Plan   Intervention Nutrition handout(s) given to patient.   Expected Outcomes Short Term Goal: Understand basic principles of dietary content, such as calories, fat, sodium, cholesterol and nutrients.      Nutrition  Discharge: Rate Your Plate Scores:     Nutrition Assessments - 04/07/16 0905    MEDFICTS Scores   Pre Score 3      Nutrition Goals Re-Evaluation:   Psychosocial: Target Goals: Acknowledge presence or absence of depression, maximize coping skills, provide positive support system. Participant is able to verbalize types and ability to use techniques and skills needed for reducing stress and depression.  Initial Review & Psychosocial Screening:     Initial Psych Review & Screening - 04/07/16 1624    Family Dynamics   Good Support System? Yes   Barriers   Psychosocial barriers to participate in program There are no identifiable barriers or psychosocial needs.   Screening Interventions   Interventions Encouraged to exercise      Quality of Life Scores:     Quality of Life - 04/07/16 1651    Quality of Life Scores   Health/Function Pre 22.4 %   Socioeconomic Pre 29.25 %   Psych/Spiritual Pre 24 %   Family Pre 25.2 %   GLOBAL Pre 24.69 %      PHQ-9:     Recent Review Flowsheet Data    Depression screen [facility] 2/9 04/07/2016   Decreased Interest 0   Down, Depressed, Hopeless 0   PHQ - 2 Score 0   Altered sleeping 0   Tired, decreased energy 0   Change in appetite 0   Feeling bad or failure about yourself  0   Trouble concentrating 0   Moving slowly or fidgety/restless 0   Suicidal thoughts 0   PHQ-9 Score 0      Psychosocial  Evaluation and Intervention:     Psychosocial Evaluation - 04/07/16 1624    Psychosocial Evaluation & Interventions   Interventions Stress management education;Relaxation education;Encouraged to exercise with the program and follow exercise prescription   Continued Psychosocial Services Needed No      Psychosocial Re-Evaluation:     Psychosocial Re-Evaluation      04/10/16 1434 05/04/16 1419         Psychosocial Re-Evaluation   Interventions Encouraged to attend Cardiac Rehabilitation for the exercise Encouraged to attend Cardiac Rehabilitation for the exercise      Continued Psychosocial Services Needed No No         Vocational Rehabilitation: Provide vocational rehab assistance to qualifying candidates.   Vocational Rehab Evaluation & Intervention:     Vocational Rehab - 04/07/16 0901    Initial Vocational Rehab Evaluation & Intervention   Assessment shows need for Vocational Rehabilitation No      Education: Education Goals: Education classes will be provided on a weekly basis, covering required topics. Participant will state understanding/return demonstration of topics presented.  Learning Barriers/Preferences:     Learning Barriers/Preferences - 04/07/16 0900    Learning Barriers/Preferences   Learning Barriers None   Learning Preferences Skilled Demonstration      Education Topics: Hypertension, Hypertension Reduction -Define heart disease and high blood pressure. Discus how high blood pressure affects the body and ways to reduce high blood pressure.      CARDIAC REHAB PHASE II EXERCISE from 05/06/2016 in [place] [facility]   Date  04/15/16   Educator  Hart Rochester   Instruction Review Code  2- meets goals/outcomes      Exercise and Your Heart -Discuss why it is important to exercise, the FITT principles of exercise, normal and abnormal responses to exercise, and how to exercise safely.      CARDIAC REHAB PHASE II  EXERCISE from 05/06/2016  in [place] [facility]   Date  04/22/16   Educator  Hart Rochester   Instruction Review Code  2- meets goals/outcomes      Angina -Discuss definition of angina, causes of angina, treatment of angina, and how to decrease risk of having angina.      CARDIAC REHAB PHASE II EXERCISE from 05/06/2016 in [facility]   Date  04/29/16   Educator  -- [D Yarianna Varble]   Instruction Review Code  2- meets goals/outcomes      Cardiac Medications -Review what the following cardiac medications are used for, how they affect the body, and side effects that may occur when taking the medications.  Medications include Aspirin, Beta blockers, calcium channel blockers, ACE Inhibitors, angiotensin receptor blockers, diuretics, digoxin, and antihyperlipidemics.          CARDIAC REHAB PHASE II EXERCISE from 05/06/2016 in [place] [facility]   Date  05/06/16   Educator  D Kaycen Whitworth   Instruction Review Code  2- meets goals/outcomes      Congestive Heart Failure -Discuss the definition of CHF, how to live with CHF, the signs and symptoms of CHF, and how keep track of weight and sodium intake.   Heart Disease and Intimacy -Discus the effect sexual activity has on the heart, how changes occur during intimacy as we age, and safety during sexual activity.   Smoking Cessation / COPD -Discuss different methods to quit smoking, the health benefits of quitting smoking, and the definition of COPD.   Nutrition I: Fats -Discuss the types of cholesterol, what cholesterol does to the heart, and how cholesterol levels can be controlled.   Nutrition II: Labels -Discuss the different components of food labels and how to read food label   Heart Parts and Heart Disease -Discuss the anatomy of the heart, the pathway of blood circulation through the heart, and these are affected by heart disease.   Stress I: Signs and Symptoms -Discuss the causes of stress, how stress may lead to  anxiety and depression, and ways to limit stress.   Stress II: Relaxation -Discuss different types of relaxation techniques to limit stress.   Warning Signs of Stroke / TIA -Discuss definition of a stroke, what the signs and symptoms are of a stroke, and how to identify when someone is having stroke.   Knowledge Questionnaire Score:     Knowledge Questionnaire Score - 04/07/16 0901    Knowledge Questionnaire Score   Pre Score 23/24      Core Components/Risk Factors/Patient Goals at Admission:     Personal Goals and Risk Factors at Admission - 04/07/16 0905    Core Components/Risk Factors/Patient Goals on Admission    Weight Management Weight Maintenance   Increase Strength and Stamina Yes   Intervention Provide advice, education, support and counseling about physical activity/exercise needs.   Expected Outcomes Achievement of increased cardiorespiratory fitness and enhanced flexibility, muscular endurance and strength shown through measurements of functional capacity and personal statement of participant.   Hypertension Yes   Intervention Provide education on lifestyle modifcations including regular physical activity/exercise, weight management, moderate sodium restriction and increased consumption of fresh fruit, vegetables, and low fat dairy, alcohol moderation, and smoking cessation.;Monitor prescription use compliance.   Expected Outcomes Long Term: Maintenance of blood pressure at goal levels.   Lipids Yes   Intervention Provide education and support for participant on nutrition & aerobic/resistive exercise along with prescribed medications to achieve LDL 70mg , HDL >40mg .  Expected Outcomes Long Term: Cholesterol controlled with medications as prescribed, with individualized exercise RX and with personalized nutrition plan. Value goals: LDL < 70mg , HDL > 40 mg.      Core Components/Risk Factors/Patient Goals Review:      Goals and Risk Factor Review      04/07/16 1624  04/10/16 1433 05/04/16 1417       Core Components/Risk Factors/Patient Goals Review   Personal Goals Review Increase Strength and Stamina;Hypertension;Lipids  Increase Strength and Stamina;Hypertension;Lipids     Review  no progress. patient will start program 04/13/16 Patient is progressing will in the program. BP's have been WNL so far.       Expected Outcomes   increase strength, Controlled HTN, Lipids        Core Components/Risk Factors/Patient Goals at Discharge (Final Review):      Goals and Risk Factor Review - 05/04/16 1417    Core Components/Risk Factors/Patient Goals Review   Personal Goals Review Increase Strength and Stamina;Hypertension;Lipids   Review Patient is progressing will in the program. BP's have been WNL so far.     Expected Outcomes increase strength, Controlled HTN, Lipids      ITP Comments:     ITP Comments      04/07/16 0901           ITP Comments patient is a 65 year old that lives with wife and enjoys playing golf play with old cars.  Patient shows no s/s of depression.           Comments: Patient is doing well with program. Will continue to monitor for progress.

## 2016-05-11 ENCOUNTER — Encounter (HOSPITAL_COMMUNITY): Payer: Medicare Other

## 2016-05-13 ENCOUNTER — Encounter (HOSPITAL_COMMUNITY): Admission: RE | Admit: 2016-05-13 | Payer: Medicare Other | Source: Ambulatory Visit

## 2016-05-14 ENCOUNTER — Observation Stay (HOSPITAL_COMMUNITY)
Admission: AD | Admit: 2016-05-14 | Discharge: 2016-05-15 | Disposition: A | Discharge status: Home / Self Care | Payer: Medicare Other | Source: Ambulatory Visit | Attending: Cardiology | Admitting: Cardiology

## 2016-05-14 ENCOUNTER — Ambulatory Visit: Payer: PRIVATE HEALTH INSURANCE | Admitting: Internal Medicine

## 2016-05-14 ENCOUNTER — Ambulatory Visit (INDEPENDENT_AMBULATORY_CARE_PROVIDER_SITE_OTHER): Payer: Medicare Other | Admitting: Cardiology

## 2016-05-14 ENCOUNTER — Inpatient Hospital Stay: Admit: 2016-05-14 | Payer: Self-pay | Admitting: Cardiology

## 2016-05-14 ENCOUNTER — Encounter (HOSPITAL_COMMUNITY)
Admission: AD | Disposition: A | Discharge status: Home / Self Care | Payer: Self-pay | Source: Ambulatory Visit | Attending: Cardiology

## 2016-05-14 ENCOUNTER — Telehealth: Payer: Self-pay | Admitting: Internal Medicine

## 2016-05-14 ENCOUNTER — Encounter: Payer: Self-pay | Admitting: Cardiology

## 2016-05-14 ENCOUNTER — Encounter (HOSPITAL_COMMUNITY): Payer: Self-pay | Admitting: General Practice

## 2016-05-14 VITALS — BP 118/72 | HR 51 | Ht 75.0 in | Wt 207.2 lb

## 2016-05-14 DIAGNOSIS — Z7902 Long term (current) use of antithrombotics/antiplatelets: Secondary | ICD-10-CM | POA: Insufficient documentation

## 2016-05-14 DIAGNOSIS — I200 Unstable angina: Secondary | ICD-10-CM | POA: Diagnosis not present

## 2016-05-14 DIAGNOSIS — Z79899 Other long term (current) drug therapy: Secondary | ICD-10-CM | POA: Insufficient documentation

## 2016-05-14 DIAGNOSIS — J45909 Unspecified asthma, uncomplicated: Secondary | ICD-10-CM | POA: Diagnosis not present

## 2016-05-14 DIAGNOSIS — R079 Chest pain, unspecified: Secondary | ICD-10-CM | POA: Diagnosis not present

## 2016-05-14 DIAGNOSIS — R001 Bradycardia, unspecified: Secondary | ICD-10-CM | POA: Diagnosis not present

## 2016-05-14 DIAGNOSIS — Z96653 Presence of artificial knee joint, bilateral: Secondary | ICD-10-CM | POA: Insufficient documentation

## 2016-05-14 DIAGNOSIS — Z7982 Long term (current) use of aspirin: Secondary | ICD-10-CM | POA: Insufficient documentation

## 2016-05-14 DIAGNOSIS — Z955 Presence of coronary angioplasty implant and graft: Secondary | ICD-10-CM | POA: Diagnosis not present

## 2016-05-14 DIAGNOSIS — I2510 Atherosclerotic heart disease of native coronary artery without angina pectoris: Secondary | ICD-10-CM

## 2016-05-14 DIAGNOSIS — I10 Essential (primary) hypertension: Secondary | ICD-10-CM | POA: Diagnosis present

## 2016-05-14 DIAGNOSIS — E785 Hyperlipidemia, unspecified: Secondary | ICD-10-CM | POA: Diagnosis not present

## 2016-05-14 DIAGNOSIS — I252 Old myocardial infarction: Secondary | ICD-10-CM | POA: Diagnosis not present

## 2016-05-14 DIAGNOSIS — Z7951 Long term (current) use of inhaled steroids: Secondary | ICD-10-CM | POA: Insufficient documentation

## 2016-05-14 DIAGNOSIS — I209 Angina pectoris, unspecified: Secondary | ICD-10-CM | POA: Diagnosis present

## 2016-05-14 DIAGNOSIS — Z87891 Personal history of nicotine dependence: Secondary | ICD-10-CM | POA: Insufficient documentation

## 2016-05-14 DIAGNOSIS — I25110 Atherosclerotic heart disease of native coronary artery with unstable angina pectoris: Principal | ICD-10-CM | POA: Insufficient documentation

## 2016-05-14 HISTORY — DX: Gastro-esophageal reflux disease without esophagitis: K21.9

## 2016-05-14 HISTORY — DX: Bradycardia, unspecified: R00.1

## 2016-05-14 HISTORY — PX: CARDIAC CATHETERIZATION: SHX172

## 2016-05-14 HISTORY — DX: Unspecified osteoarthritis, unspecified site: M19.90

## 2016-05-14 HISTORY — DX: Non-ST elevation (NSTEMI) myocardial infarction: I21.4

## 2016-05-14 HISTORY — DX: Atherosclerotic heart disease of native coronary artery without angina pectoris: I25.10

## 2016-05-14 HISTORY — DX: Sepsis, unspecified organism: A41.9

## 2016-05-14 LAB — COMPREHENSIVE METABOLIC PANEL
ALBUMIN: 3.5 g/dL (ref 3.5–5.0)
ALK PHOS: 86 U/L (ref 38–126)
ALT: 32 U/L (ref 17–63)
ANION GAP: 4 — AB (ref 5–15)
AST: 27 U/L (ref 15–41)
BILIRUBIN TOTAL: 2 mg/dL — AB (ref 0.3–1.2)
BUN: 15 mg/dL (ref 6–20)
CALCIUM: 9.1 mg/dL (ref 8.9–10.3)
CO2: 30 mmol/L (ref 22–32)
CREATININE: 0.84 mg/dL (ref 0.61–1.24)
Chloride: 107 mmol/L (ref 101–111)
GFR calc Af Amer: >60 mL/min (ref >60)
GFR calc non Af Amer: >60 mL/min (ref >60)
GLUCOSE: 90 mg/dL (ref 65–99)
Potassium: 3.9 mmol/L (ref 3.5–5.1)
SODIUM: 141 mmol/L (ref 135–145)
TOTAL PROTEIN: 5.9 g/dL — AB (ref 6.5–8.1)

## 2016-05-14 LAB — CBC
HEMATOCRIT: 44.5 % (ref 39.0–52.0)
Hemoglobin: 14.9 g/dL (ref 13.0–17.0)
MCH: 30.3 pg (ref 26.0–34.0)
MCHC: 33.5 g/dL (ref 30.0–36.0)
MCV: 90.4 fL (ref 78.0–100.0)
PLATELETS: 194 10*3/uL (ref 150–400)
RBC: 4.92 MIL/uL (ref 4.22–5.81)
RDW: 13.8 % (ref 11.5–15.5)
WBC: 5.1 10*3/uL (ref 4.0–10.5)

## 2016-05-14 LAB — PROTIME-INR
INR: 1.12 (ref 0.00–1.49)
Prothrombin Time: 14.6 seconds (ref 11.6–15.2)

## 2016-05-14 LAB — TROPONIN I

## 2016-05-14 LAB — HEPARIN LEVEL (UNFRACTIONATED): Heparin Unfractionated: 0.73 IU/mL — ABNORMAL HIGH (ref 0.30–0.70)

## 2016-05-14 SURGERY — LEFT HEART CATH AND CORONARY ANGIOGRAPHY

## 2016-05-14 MED ORDER — IOPAMIDOL (ISOVUE-370) INJECTION 76%
INTRAVENOUS | Status: AC
  Filled 2016-05-14: qty 100

## 2016-05-14 MED ORDER — FLUTICASONE PROPIONATE 50 MCG/ACT NA SUSP
2.0000 | Freq: Every day | NASAL | Status: DC
  Filled 2016-05-14 (×2): qty 16

## 2016-05-14 MED ORDER — HEPARIN (PORCINE) IN NACL 2-0.9 UNIT/ML-% IJ SOLN
INTRAMUSCULAR | Status: AC
  Filled 2016-05-14: qty 1000

## 2016-05-14 MED ORDER — SODIUM CHLORIDE 0.9 % IV SOLN
INTRAVENOUS | Status: DC
  Administered 2016-05-14: 75 mL/h via INTRAVENOUS

## 2016-05-14 MED ORDER — HEPARIN SODIUM (PORCINE) 1000 UNIT/ML IJ SOLN
INTRAMUSCULAR | Status: DC | PRN
  Administered 2016-05-14: 4500 [IU] via INTRAVENOUS

## 2016-05-14 MED ORDER — HEPARIN (PORCINE) IN NACL 2-0.9 UNIT/ML-% IJ SOLN
INTRAMUSCULAR | Status: DC | PRN
  Administered 2016-05-14: 20:00:00

## 2016-05-14 MED ORDER — ALBUTEROL SULFATE (2.5 MG/3ML) 0.083% IN NEBU: 2.5000 mg | INHALATION_SOLUTION | RESPIRATORY_TRACT | Status: DC | PRN

## 2016-05-14 MED ORDER — ATORVASTATIN CALCIUM 80 MG PO TABS: 80.0000 mg | ORAL_TABLET | Freq: Every day | ORAL | Status: DC

## 2016-05-14 MED ORDER — SODIUM CHLORIDE 0.9 % IV SOLN: 250.0000 mL | INTRAVENOUS | Status: DC | PRN

## 2016-05-14 MED ORDER — TICAGRELOR 90 MG PO TABS
90.0000 mg | ORAL_TABLET | Freq: Two times a day (BID) | ORAL | Status: DC
  Administered 2016-05-14 – 2016-05-15 (×2): 90 mg via ORAL
  Filled 2016-05-14 (×2): qty 1

## 2016-05-14 MED ORDER — DIAZEPAM 5 MG PO TABS: 5.0000 mg | ORAL_TABLET | ORAL | Status: DC | PRN

## 2016-05-14 MED ORDER — TICAGRELOR 90 MG PO TABS: 90.0000 mg | ORAL_TABLET | Freq: Two times a day (BID) | ORAL | Status: DC

## 2016-05-14 MED ORDER — SODIUM CHLORIDE 0.9% FLUSH: 3.0000 mL | INTRAVENOUS | Status: DC | PRN

## 2016-05-14 MED ORDER — NITROGLYCERIN 0.4 MG SL SUBL: 0.4000 mg | SUBLINGUAL_TABLET | SUBLINGUAL | Status: DC | PRN

## 2016-05-14 MED ORDER — PANTOPRAZOLE SODIUM 40 MG PO TBEC
40.0000 mg | DELAYED_RELEASE_TABLET | Freq: Every day | ORAL | Status: DC
  Administered 2016-05-15: 40 mg via ORAL
  Filled 2016-05-14: qty 1

## 2016-05-14 MED ORDER — FENTANYL CITRATE (PF) 100 MCG/2ML IJ SOLN
INTRAMUSCULAR | Status: DC | PRN
  Administered 2016-05-14: 50 ug via INTRAVENOUS

## 2016-05-14 MED ORDER — SODIUM CHLORIDE 0.9% FLUSH
3.0000 mL | Freq: Two times a day (BID) | INTRAVENOUS | Status: DC
  Administered 2016-05-15: 3 mL via INTRAVENOUS

## 2016-05-14 MED ORDER — VERAPAMIL HCL 2.5 MG/ML IV SOLN
INTRA_ARTERIAL | Status: DC | PRN
  Administered 2016-05-14: 19:00:00 via INTRA_ARTERIAL

## 2016-05-14 MED ORDER — LIDOCAINE HCL (PF) 1 % IJ SOLN
INTRAMUSCULAR | Status: DC | PRN
  Administered 2016-05-14: 2 mL

## 2016-05-14 MED ORDER — NITROGLYCERIN 1 MG/10 ML FOR IR/CATH LAB
INTRA_ARTERIAL | Status: AC
  Filled 2016-05-14: qty 10

## 2016-05-14 MED ORDER — HEPARIN BOLUS VIA INFUSION
4000.0000 [IU] | Freq: Once | INTRAVENOUS | Status: AC
  Administered 2016-05-14: 4000 [IU] via INTRAVENOUS
  Filled 2016-05-14: qty 4000

## 2016-05-14 MED ORDER — METOPROLOL TARTRATE 12.5 MG HALF TABLET
12.5000 mg | ORAL_TABLET | Freq: Two times a day (BID) | ORAL | Status: DC
Start: 2016-05-14 — End: 2016-05-14
  Filled 2016-05-14: qty 1

## 2016-05-14 MED ORDER — IOPAMIDOL (ISOVUE-370) INJECTION 76%
INTRAVENOUS | Status: DC | PRN
  Administered 2016-05-14: 85 mL via INTRA_ARTERIAL

## 2016-05-14 MED ORDER — ONDANSETRON HCL 4 MG/2ML IJ SOLN: 4.0000 mg | Freq: Four times a day (QID) | INTRAMUSCULAR | Status: DC | PRN

## 2016-05-14 MED ORDER — FENTANYL CITRATE (PF) 100 MCG/2ML IJ SOLN
INTRAMUSCULAR | Status: AC
  Filled 2016-05-14: qty 2

## 2016-05-14 MED ORDER — ACETAMINOPHEN 325 MG PO TABS: 650.0000 mg | ORAL_TABLET | ORAL | Status: DC | PRN

## 2016-05-14 MED ORDER — MIDAZOLAM HCL 2 MG/2ML IJ SOLN
INTRAMUSCULAR | Status: DC | PRN
  Administered 2016-05-14: 2 mg via INTRAVENOUS

## 2016-05-14 MED ORDER — ASPIRIN EC 81 MG PO TBEC
81.0000 mg | DELAYED_RELEASE_TABLET | Freq: Every day | ORAL | Status: DC
  Administered 2016-05-15: 81 mg via ORAL
  Filled 2016-05-14: qty 1

## 2016-05-14 MED ORDER — VERAPAMIL HCL 2.5 MG/ML IV SOLN
INTRAVENOUS | Status: AC
  Filled 2016-05-14: qty 2

## 2016-05-14 MED ORDER — MIDAZOLAM HCL 2 MG/2ML IJ SOLN
INTRAMUSCULAR | Status: AC
  Filled 2016-05-14: qty 2

## 2016-05-14 MED ORDER — METOPROLOL TARTRATE 25 MG PO TABS: 25.0000 mg | ORAL_TABLET | Freq: Two times a day (BID) | ORAL | Status: DC

## 2016-05-14 MED ORDER — LORATADINE 10 MG PO TABS
10.0000 mg | ORAL_TABLET | Freq: Every day | ORAL | Status: DC
Start: 2016-05-15 — End: 2016-05-15
  Administered 2016-05-15: 10 mg via ORAL
  Filled 2016-05-14: qty 1

## 2016-05-14 MED ORDER — SODIUM CHLORIDE 0.9% FLUSH
3.0000 mL | Freq: Two times a day (BID) | INTRAVENOUS | Status: DC
  Administered 2016-05-14: 3 mL via INTRAVENOUS

## 2016-05-14 MED ORDER — ASPIRIN EC 81 MG PO TBEC: 81.0000 mg | DELAYED_RELEASE_TABLET | Freq: Every day | ORAL | Status: DC

## 2016-05-14 MED ORDER — SODIUM CHLORIDE 0.9 % IV SOLN
INTRAVENOUS | Status: DC
  Administered 2016-05-15: 150 mL/h via INTRAVENOUS

## 2016-05-14 MED ORDER — HEPARIN (PORCINE) IN NACL 100-0.45 UNIT/ML-% IJ SOLN
1150.0000 [IU]/h | INTRAMUSCULAR | Status: DC
  Administered 2016-05-14: 1150 [IU]/h via INTRAVENOUS
  Filled 2016-05-14: qty 250

## 2016-05-14 MED ORDER — NITROGLYCERIN 0.4 MG SL SUBL
SUBLINGUAL_TABLET | SUBLINGUAL | Status: AC
  Administered 2016-05-14: 0.4 mg
  Filled 2016-05-14: qty 1

## 2016-05-14 MED ORDER — ADULT MULTIVITAMIN W/MINERALS CH
1.0000 | ORAL_TABLET | Freq: Every day | ORAL | Status: DC
  Administered 2016-05-15: 1 via ORAL
  Filled 2016-05-14: qty 1

## 2016-05-14 MED ORDER — ACETAMINOPHEN 325 MG PO TABS
650.0000 mg | ORAL_TABLET | ORAL | Status: DC | PRN
  Administered 2016-05-15: 650 mg via ORAL
  Filled 2016-05-14: qty 2

## 2016-05-14 MED ORDER — HEPARIN SODIUM (PORCINE) 1000 UNIT/ML IJ SOLN
INTRAMUSCULAR | Status: AC
  Filled 2016-05-14: qty 1

## 2016-05-14 MED ORDER — ISOSORBIDE MONONITRATE ER 30 MG PO TB24
30.0000 mg | ORAL_TABLET | Freq: Every day | ORAL | Status: DC
  Administered 2016-05-15: 30 mg via ORAL
  Filled 2016-05-14: qty 1

## 2016-05-14 MED ORDER — LIDOCAINE HCL (PF) 1 % IJ SOLN
INTRAMUSCULAR | Status: AC
Start: 2016-05-14 — End: 2016-05-14
  Filled 2016-05-14: qty 30

## 2016-05-14 MED ORDER — LOSARTAN POTASSIUM 25 MG PO TABS
25.0000 mg | ORAL_TABLET | Freq: Every day | ORAL | Status: DC
  Administered 2016-05-15: 25 mg via ORAL
  Filled 2016-05-14: qty 1

## 2016-05-14 MED ORDER — MOMETASONE FURO-FORMOTEROL FUM 200-5 MCG/ACT IN AERO
2.0000 | INHALATION_SPRAY | Freq: Two times a day (BID) | RESPIRATORY_TRACT | Status: DC
  Administered 2016-05-15: 2 via RESPIRATORY_TRACT
  Filled 2016-05-14: qty 8.8

## 2016-05-14 SURGICAL SUPPLY — 10 items
CATH INFINITI 5FR ANG PIGTAIL (CATHETERS) ×2 IMPLANT
CATH OPTITORQUE TIG 4.0 5F (CATHETERS) ×2 IMPLANT
DEVICE RAD COMP TR BAND LRG (VASCULAR PRODUCTS) ×2 IMPLANT
GLIDESHEATH SLEND SS 6F .021 (SHEATH) ×2 IMPLANT
KIT HEART LEFT (KITS) ×3 IMPLANT
PACK CARDIAC CATHETERIZATION (CUSTOM PROCEDURE TRAY) ×3 IMPLANT
SYR MEDRAD MARK V 150ML (SYRINGE) ×3 IMPLANT
TRANSDUCER W/STOPCOCK (MISCELLANEOUS) ×3 IMPLANT
TUBING CIL FLEX 10 FLL-RA (TUBING) ×3 IMPLANT
WIRE SAFE-T 1.5MM-J .035X260CM (WIRE) ×2 IMPLANT

## 2016-05-14 NOTE — Telephone Encounter (Signed)
Returned call. Pt c/o CP going into shoulder since Sunday, intermittently, which resolves w/ nitro x1. He is a patient of Dr. Rennis GoldenHilty who was last seen in March by Bjorn Loserhonda following hospitalization for NSTEMI Pt enrolled and [facility]. Pt rates pain 5/10 at most severe. He states it is "not like it was before going into the hospital" (Not as severe). He does note that the nitro relieves his pain completely. He denies SOB, arm or neck pain. He denies missed doses of medications.  Dr. Antoine PocheHochrein had open slot and I verified w/ him OK to add patient to be seen. Pt will come in for OV evaluation this AM

## 2016-05-14 NOTE — Telephone Encounter (Signed)
New Message  Pt c/o of Chest Pain: 1. Are you having CP right now? Yes but it is really really mild. Just chest tightness right now. ( has had chest pain since Saturday off and on)   2. Are you experiencing any other symptoms (ex. SOB, nausea, vomiting, sweating)? No not really. No more than usual   3. How long have you been experiencing CP? Since Saturday afternoon (05/09/2016)   4. Is your CP continuous or coming and going? Coming and going  5. Have you taken Nitroglycerin? Yes   Comments: Yesterday he didn't have any symptoms while playing golf but this morning the pain/ tightness has started back up.

## 2016-05-14 NOTE — Progress Notes (Signed)
ANTICOAGULATION CONSULT NOTE - Initial Consult  Pharmacy Consult for Heparin Indication: chest pain/ACS  Allergies  Allergen Reactions  . Oxycontin [Oxycodone Hcl] Anaphylaxis  . Percocet [Oxycodone-Acetaminophen] Anaphylaxis  . Ambien [Zolpidem Tartrate] Other (See Comments)    Mood changes     Patient Measurements: Height: 6\' 3"  (190.5 cm) Weight: 201 lb 11.2 oz (91.491 kg) IBW/kg (Calculated) : 84.5 Heparin Dosing Weight: 91.5 kg  Vital Signs: Temp: 97.9 F (36.6 C) (06/01 1258) Temp Source: Oral (06/01 1258) BP: 145/90 mmHg (06/01 1258) Pulse Rate: 51 (06/01 1258)  Labs: No results for input(s): HGB, HCT, PLT, APTT, LABPROT, INR, HEPARINUNFRC, HEPRLOWMOCWT, CREATININE, CKTOTAL, CKMB, TROPONINI in the last 72 hours.  CrCl cannot be calculated (Patient has no serum creatinine result on file.).   Medical History: Past Medical History  Diagnosis Date  . Hypertension   . Hyperlipidemia   . Gilbert's disease   . Unspecified septicemia   . 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 Unspecified tinnitus     right  . Asthma     Medications:  Prescriptions prior to admission  Medication Sig Dispense Refill Last Dose  . albuterol (PROAIR HFA) 108 (90 BASE) MCG/ACT inhaler 2 puffs every 4 hours as needed only  if your can't catch your breath 1 Inhaler 1 Taking  . aspirin 81 MG tablet Take 81 mg by mouth daily.   Taking  . atorvastatin (LIPITOR) 80 MG tablet Take 1 tablet (80 mg total) by mouth daily at 6 PM. 30 tablet 30 Taking  . cetirizine (ZYRTEC) 10 MG tablet Take 10 mg by mouth daily.   Taking  . fluticasone (FLONASE) 50 MCG/ACT nasal spray Place 2 sprays into both nostrils daily.   Taking  . Garlic 1000 MG CAPS Take by mouth daily.   Taking  . losartan (COZAAR) 50 MG tablet Take 0.5 tablets (25 mg total) by mouth daily.   Taking  . metoprolol tartrate (LOPRESSOR) 25 MG tablet Take 0.5 tablets (12.5 mg total) by mouth 2 (two) times daily.   Taking  .  mometasone-formoterol (DULERA) 200-5 MCG/ACT AERO Take 2 puffs first thing in am and then another 2 puffs about 12 hours later. 1 Inhaler 11 Taking  . Multiple Vitamin (MULTIVITAMIN WITH MINERALS) TABS tablet Take 1 tablet by mouth daily.   Taking  . nitroGLYCERIN (NITROSTAT) 0.4 MG SL tablet Place 1 tablet (0.4 mg total) under the tongue every 5 (five) minutes x 3 doses as needed for chest pain. 25 tablet 12 Taking  . omeprazole (PRILOSEC) 40 MG capsule Take 1 capsule (40 mg total) by mouth 2 (two) times daily. 60 capsule 2 Taking  . ticagrelor (BRILINTA) 90 MG TABS tablet Take 1 tablet (90 mg total) by mouth 2 (two) times daily. 60 tablet 11 Taking    Assessment: 65 yo M direct admit from MD office with chest pain.  For cardiac cath today. Had cardiac cath with PCI March 2017. Hep therapeutic on 1150 units/hr during that admission.  Goal of Therapy:  Heparin level 0.3-0.7 units/ml Monitor platelets by anticoagulation protocol: Yes   Plan:  Heparin 4000 units IV bolus x 1 Heparin infusion at 1150 units/hr Heparin level in 6 hours (vs follow-up after cath) Heparin level and CBC daily   Toys 'R' UsKimberly Borden Thune, Pharm.D., BCPS Clinical Pharmacist Pager [phone] 05/14/2016 1:43 PM

## 2016-05-14 NOTE — Progress Notes (Signed)
Cardiology Office Note   Date:  05/14/2016   ID:  Louis Perez, DOB Jul 24, 1951, MRN [id number]  PCP:  Delorse LekBURNETT,BRENT A, MD  Cardiologist:   Rollene RotundaJames Elga Santy, MD   Chief Complaint  Patient presents with  . Coronary Artery Disease      History of Present Illness: Louis MornLawrence E Perez is a 65 y.o. male who presents for evaluation of chest and back pain. Patient has a history of coronary disease with recent NSTEMI in March with DES stenting of the LAD.  He had non obstructive disease elsewhere.  EF was normal.    He was added to my schedule today for evaluation of chest and arm discomfort. This began on Sunday. It is similar to his previous unstable angina but not as severe. Still he describes the peak of his discomfort at 7 out of 10 in intensity. He gets across his chest. His left arm hurt. At times he has noticed some diaphoresis. It might last for only a minute. He has taken nitroglycerin and over the course of the last few days and it improves the symptoms. He describes it as somewhat heavy. He's not had any palpitations, presyncope or syncope. He's not describing any new shortness of breath, PND or orthopnea. He has brought the discomfort on a few times when he's been doing activities although this is been intermittent. He was actually able to do a little golfing yesterday without symptoms. However, prior to this he had discomfort with golfing building his children sandbox. This morning the pain was at rest. He still describes a little mid chest tightness.  Of note he has not had the symptoms since his stent.  I did review his catheterization. I looked at the images and he had a very high grade proximal LAD in March with nonobstructive disease in the right coronary.  Past Medical History  Diagnosis Date  . Hypertension   . Hyperlipidemia   . Gilbert's disease   . Unspecified septicemia   . 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 Unspecified tinnitus     right  . Asthma      Past Surgical History  Procedure Laterality Date  . Neck surgery    . Back surgery    . Nasal sinus surgery  08/2011  . Knee surgery Bilateral 2005    replacement,partial knee replacement because of bacterial infection-2012  . Cardiovascular stress test  05/26/2012    EKG negative for ischemia, no ECG changes, no significant ischemia noted  . Transthoracic echocardiogram  05/26/2012    EF >55%, mild concentric LVH  . Cardiac catheterization N/A 03/01/2016    Procedure: Left Heart Cath and Coronary Angiography;  Surgeon: Tonny BollmanMichael Cooper, MD;  oLAD 95%, CFX/RI/RCA/RPDA 25-30%  . Cardiac catheterization N/A 03/01/2016    Procedure: Coronary Stent Intervention;  Surgeon: Tonny BollmanMichael Cooper, MD;  Promus Premier 4.0 x  12 mm DES to oLAD     Current Outpatient Prescriptions  Medication Sig Dispense Refill  . albuterol (PROAIR HFA) 108 (90 BASE) MCG/ACT inhaler 2 puffs every 4 hours as needed only  if your can't catch your breath 1 Inhaler 1  . aspirin 81 MG tablet Take 81 mg by mouth daily.    Marland Kitchen. atorvastatin (LIPITOR) 80 MG tablet Take 1 tablet (80 mg total) by mouth daily at 6 PM. 30 tablet 30  . cetirizine (ZYRTEC) 10 MG tablet Take 10 mg by mouth daily.    . fluticasone (FLONASE) 50 MCG/ACT nasal spray Place 2  sprays into both nostrils daily.    . Garlic 1000 MG CAPS Take by mouth daily.    Marland Kitchen losartan (COZAAR) 50 MG tablet Take 0.5 tablets (25 mg total) by mouth daily.    . metoprolol tartrate (LOPRESSOR) 25 MG tablet Take 0.5 tablets (12.5 mg total) by mouth 2 (two) times daily.    . mometasone-formoterol (DULERA) 200-5 MCG/ACT AERO Take 2 puffs first thing in am and then another 2 puffs about 12 hours later. 1 Inhaler 11  . Multiple Vitamin (MULTIVITAMIN WITH MINERALS) TABS tablet Take 1 tablet by mouth daily.    . nitroGLYCERIN (NITROSTAT) 0.4 MG SL tablet Place 1 tablet (0.4 mg total) under the tongue every 5 (five) minutes x 3 doses as needed for chest pain. 25 tablet 12  . omeprazole  (PRILOSEC) 40 MG capsule Take 1 capsule (40 mg total) by mouth 2 (two) times daily. 60 capsule 2  . ticagrelor (BRILINTA) 90 MG TABS tablet Take 1 tablet (90 mg total) by mouth 2 (two) times daily. 60 tablet 11   No current facility-administered medications for this visit.    Allergies:   Oxycontin; Percocet; and Ambien    Social History:  The patient  reports that he quit smoking about 47 years ago. His smoking use included Cigarettes. He quit after 3 years of use. He has never used smokeless tobacco. He reports that he drinks alcohol. He reports that he does not use illicit drugs.   Family History:  The patient's family history includes Alzheimer's disease in his father; Dementia in his father; Heart disease in his mother; Heart disease (age of onset: 29) in his brother.    ROS:  Please see the history of present illness.   Otherwise, review of systems are positive for none.   All other systems are reviewed and negative.    PHYSICAL EXAM: VS:  BP 118/72 mmHg  Pulse 51  Ht  (1.905 m)  Wt 207 lb 3.2 oz (93.985 kg)  BMI 25.90 kg/m2 , BMI Body mass index is 25.9 kg/(m^2). GENERAL:  Well appearing HEENT:  Pupils equal round and reactive, fundi not visualized, oral mucosa unremarkable NECK:  No jugular venous distention, waveform within normal limits, carotid upstroke brisk and symmetric, no bruits, no thyromegaly LYMPHATICS:  No cervical, inguinal adenopathy LUNGS:  Clear to auscultation bilaterally BACK:  No CVA tenderness CHEST:  Unremarkable HEART:  PMI not displaced or sustained,S1 and S2 within normal limits, no S3, no S4, no clicks, no rubs, no murmurs ABD:  Flat, positive bowel sounds normal in frequency in pitch, no bruits, no rebound, no guarding, no midline pulsatile mass, no hepatomegaly, no splenomegaly EXT:  2 plus pulses throughout, no edema, no cyanosis no clubbing SKIN:  No rashes no nodules NEURO:  Cranial nerves II through XII grossly intact, motor grossly intact  throughout PSYCH:  Cognitively intact, oriented to person place and time    EKG:  EKG is ordered today. The ekg ordered today demonstrates Sinus rhythm, rate 51, axis within normal limits, intervals within normal limits, no acute ST-T wave changes.   Recent Labs: 03/01/2016: B Natriuretic Peptide 51.0; Magnesium 1.9 03/02/2016: ALT 18; BUN 12; Creatinine, Ser 0.75; Hemoglobin 16.1; Platelets 219; Potassium 3.7; Sodium 139    Lipid Panel    Component Value Date/Time   CHOL 165 03/01/2016 0448   TRIG 85 03/01/2016 0448   HDL 55 03/01/2016 0448   CHOLHDL 3.0 03/01/2016 0448   VLDL 17 03/01/2016 0448   LDLCALC  93 03/01/2016 0448      Wt Readings from Last 3 Encounters:  05/14/16 207 lb 3.2 oz (93.985 kg)  04/09/16 206 lb 11.2 oz (93.759 kg)  03/10/16 206 lb (93.441 kg)      Other studies Reviewed: Additional studies/ records that were reviewed today include: Cath films. Review of the above records demonstrates:  Please see elsewhere in the note.     ASSESSMENT AND PLAN:  UNSTABLE ANGINA:  Current symptoms need to be considered is unstable angina. He'll be transported to: For cardiac catheterization today. The patient understands that risks included but are not limited to stroke (1 in 1000), death (1 in 1000), kidney failure [usually temporary] (1 in 500), bleeding (1 in 200), allergic reaction [possibly serious] (1 in 200).  The patient understands and agrees to proceed.     DYSLIPIDEMIA:  He will currently continue the meds as listed.   Current medicines are reviewed at length with the patient today.  The patient does not have concerns regarding medicines.  The following changes have been made:  no change  Labs/ tests ordered today include: Admit for cath  No orders of the defined types were placed in this encounter.     Disposition:   FU with Dr. Rennis Golden    Signed, Rollene Rotunda, MD  05/14/2016 11:31 AM    [facility]

## 2016-05-14 NOTE — Interval H&P Note (Signed)
Cath Lab Visit (complete for each Cath Lab visit)  Clinical Evaluation Leading to the Procedure:   ACS: No.  Non-ACS:    Anginal Classification: CCS III  Anti-ischemic medical therapy: Maximal Therapy (2 or more classes of medications)  Non-Invasive Test Results: No non-invasive testing performed  Prior CABG: No previous CABG      History and Physical Interval Note:  05/14/2016 6:16 PM  Louis Perez  has presented today for surgery, with the diagnosis of cp  The various methods of treatment have been discussed with the patient and family. After consideration of risks, benefits and other options for treatment, the patient has consented to  Procedure(s): Left Heart Cath and Coronary Angiography (N/A) as a surgical intervention .  The patient's history has been reviewed, patient examined, no change in status, stable for surgery.  I have reviewed the patient's chart and labs.  Questions were answered to the patient's satisfaction.     Nicki Guadalajarahomas Andrienne Havener

## 2016-05-14 NOTE — H&P (View-Only) (Signed)
  Cardiology Office Note   Date:  05/14/2016   ID:  Louis Perez, DOB 08/27/1951, MRN [id number]  PCP:  BURNETT,BRENT A, MD  Cardiologist:   Ishanvi Mcquitty, MD   Chief Complaint  Patient presents with  . Coronary Artery Disease      History of Present Illness: Louis Perez is a 65 y.o. male who presents for evaluation of chest and back pain. Patient has a history of coronary disease with recent NSTEMI in March with DES stenting of the LAD.  He had non obstructive disease elsewhere.  EF was normal.    He was added to my schedule today for evaluation of chest and arm discomfort. This began on Sunday. It is similar to his previous unstable angina but not as severe. Still he describes the peak of his discomfort at 7 out of 10 in intensity. He gets across his chest. His left arm hurt. At times he has noticed some diaphoresis. It might last for only a minute. He has taken nitroglycerin and over the course of the last few days and it improves the symptoms. He describes it as somewhat heavy. He's not had any palpitations, presyncope or syncope. He's not describing any new shortness of breath, PND or orthopnea. He has brought the discomfort on a few times when he's been doing activities although this is been intermittent. He was actually able to do a little golfing yesterday without symptoms. However, prior to this he had discomfort with golfing building his children sandbox. This morning the pain was at rest. He still describes a little mid chest tightness.  Of note he has not had the symptoms since his stent.  I did review his catheterization. I looked at the images and he had a very high grade proximal LAD in March with nonobstructive disease in the right coronary.  Past Medical History  Diagnosis Date  . Hypertension   . Hyperlipidemia   . Gilbert's disease   . Unspecified septicemia   . 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 Unspecified tinnitus     right  . Asthma      Past Surgical History  Procedure Laterality Date  . Neck surgery    . Back surgery    . Nasal sinus surgery  08/2011  . Knee surgery Bilateral 2005    replacement,partial knee replacement because of bacterial infection-2012  . Cardiovascular stress test  05/26/2012    EKG negative for ischemia, no ECG changes, no significant ischemia noted  . Transthoracic echocardiogram  05/26/2012    EF >55%, mild concentric LVH  . Cardiac catheterization N/A 03/01/2016    Procedure: Left Heart Cath and Coronary Angiography;  Surgeon: Michael Cooper, MD;  oLAD 95%, CFX/RI/RCA/RPDA 25-30%  . Cardiac catheterization N/A 03/01/2016    Procedure: Coronary Stent Intervention;  Surgeon: Michael Cooper, MD;  Promus Premier 4.0 x  12 mm DES to oLAD     Current Outpatient Prescriptions  Medication Sig Dispense Refill  . albuterol (PROAIR HFA) 108 (90 BASE) MCG/ACT inhaler 2 puffs every 4 hours as needed only  if your can't catch your breath 1 Inhaler 1  . aspirin 81 MG tablet Take 81 mg by mouth daily.    . atorvastatin (LIPITOR) 80 MG tablet Take 1 tablet (80 mg total) by mouth daily at 6 PM. 30 tablet 30  . cetirizine (ZYRTEC) 10 MG tablet Take 10 mg by mouth daily.    . fluticasone (FLONASE) 50 MCG/ACT nasal spray Place 2   sprays into both nostrils daily.    . Garlic 1000 MG CAPS Take by mouth daily.    . losartan (COZAAR) 50 MG tablet Take 0.5 tablets (25 mg total) by mouth daily.    . metoprolol tartrate (LOPRESSOR) 25 MG tablet Take 0.5 tablets (12.5 mg total) by mouth 2 (two) times daily.    . mometasone-formoterol (DULERA) 200-5 MCG/ACT AERO Take 2 puffs first thing in am and then another 2 puffs about 12 hours later. 1 Inhaler 11  . Multiple Vitamin (MULTIVITAMIN WITH MINERALS) TABS tablet Take 1 tablet by mouth daily.    . nitroGLYCERIN (NITROSTAT) 0.4 MG SL tablet Place 1 tablet (0.4 mg total) under the tongue every 5 (five) minutes x 3 doses as needed for chest pain. 25 tablet 12  . omeprazole  (PRILOSEC) 40 MG capsule Take 1 capsule (40 mg total) by mouth 2 (two) times daily. 60 capsule 2  . ticagrelor (BRILINTA) 90 MG TABS tablet Take 1 tablet (90 mg total) by mouth 2 (two) times daily. 60 tablet 11   No current facility-administered medications for this visit.    Allergies:   Oxycontin; Percocet; and Ambien    Social History:  The patient  reports that he quit smoking about 47 years ago. His smoking use included Cigarettes. He quit after 3 years of use. He has never used smokeless tobacco. He reports that he drinks alcohol. He reports that he does not use illicit drugs.   Family History:  The patient's family history includes Alzheimer's disease in his father; Dementia in his father; Heart disease in his mother; Heart disease (age of onset: 60) in his brother.    ROS:  Please see the history of present illness.   Otherwise, review of systems are positive for none.   All other systems are reviewed and negative.    PHYSICAL EXAM: VS:  BP 118/72 mmHg  Pulse 51  Ht 6' 3" (1.905 m)  Wt 207 lb 3.2 oz (93.985 kg)  BMI 25.90 kg/m2 , BMI Body mass index is 25.9 kg/(m^2). GENERAL:  Well appearing HEENT:  Pupils equal round and reactive, fundi not visualized, oral mucosa unremarkable NECK:  No jugular venous distention, waveform within normal limits, carotid upstroke brisk and symmetric, no bruits, no thyromegaly LYMPHATICS:  No cervical, inguinal adenopathy LUNGS:  Clear to auscultation bilaterally BACK:  No CVA tenderness CHEST:  Unremarkable HEART:  PMI not displaced or sustained,S1 and S2 within normal limits, no S3, no S4, no clicks, no rubs, no murmurs ABD:  Flat, positive bowel sounds normal in frequency in pitch, no bruits, no rebound, no guarding, no midline pulsatile mass, no hepatomegaly, no splenomegaly EXT:  2 plus pulses throughout, no edema, no cyanosis no clubbing SKIN:  No rashes no nodules NEURO:  Cranial nerves II through XII grossly intact, motor grossly intact  throughout PSYCH:  Cognitively intact, oriented to person place and time    EKG:  EKG is ordered today. The ekg ordered today demonstrates Sinus rhythm, rate 51, axis within normal limits, intervals within normal limits, no acute ST-T wave changes.   Recent Labs: 03/01/2016: B Natriuretic Peptide 51.0; Magnesium 1.9 03/02/2016: ALT 18; BUN 12; Creatinine, Ser 0.75; Hemoglobin 16.1; Platelets 219; Potassium 3.7; Sodium 139    Lipid Panel    Component Value Date/Time   CHOL 165 03/01/2016 0448   TRIG 85 03/01/2016 0448   HDL 55 03/01/2016 0448   CHOLHDL 3.0 03/01/2016 0448   VLDL 17 03/01/2016 0448   LDLCALC   93 03/01/2016 0448      Wt Readings from Last 3 Encounters:  05/14/16 207 lb 3.2 oz (93.985 kg)  04/09/16 206 lb 11.2 oz (93.759 kg)  03/10/16 206 lb (93.441 kg)      Other studies Reviewed: Additional studies/ records that were reviewed today include: Cath films. Review of the above records demonstrates:  Please see elsewhere in the note.     ASSESSMENT AND PLAN:  UNSTABLE ANGINA:  Current symptoms need to be considered is unstable angina. He'll be transported to: For cardiac catheterization today. The patient understands that risks included but are not limited to stroke (1 in 1000), death (1 in 1000), kidney failure [usually temporary] (1 in 500), bleeding (1 in 200), allergic reaction [possibly serious] (1 in 200).  The patient understands and agrees to proceed.     DYSLIPIDEMIA:  He will currently continue the meds as listed.   Current medicines are reviewed at length with the patient today.  The patient does not have concerns regarding medicines.  The following changes have been made:  no change  Labs/ tests ordered today include: Admit for cath  No orders of the defined types were placed in this encounter.     Disposition:   FU with Dr. [name]    Signed, Sae Handrich, MD  05/14/2016 11:31 AM    [facility]     

## 2016-05-14 NOTE — H&P (Signed)
Cardiology Office Note   Date: 05/14/2016   ID: Louis Perez, DOB 1950/12/20, MRN [id number]  PCP: Louis Lek, MD Cardiologist: Rollene Rotunda, MD   Chief Complaint  Patient presents with  . Coronary Artery Disease     History of Present Illness: Louis Perez is a 65 y.o. male who presents for evaluation of chest and back pain. Patient has a history of coronary disease with recent NSTEMI in March with DES stenting of the LAD. He had non obstructive disease elsewhere. EF was normal.   He was added to my schedule today for evaluation of chest and arm discomfort. This began on Sunday. It is similar to his previous unstable angina but not as severe. Still he describes the peak of his discomfort at 7 out of 10 in intensity. He gets across his chest. His left arm hurt. At times he has noticed some diaphoresis. It might last for only a minute. He has taken nitroglycerin and over the course of the last few days and it improves the symptoms. He describes it as somewhat heavy. He's not had any palpitations, presyncope or syncope. He's not describing any new shortness of breath, PND or orthopnea. He has brought the discomfort on a few times when he's been doing activities although this is been intermittent. He was actually able to do a little golfing yesterday without symptoms. However, prior to this he had discomfort with golfing building his children sandbox. This morning the pain was at rest. He still describes a little mid chest tightness. Of note he has not had the symptoms since his stent.  I did review his catheterization. I looked at the images and he had a very high grade proximal LAD in March with nonobstructive disease in the right coronary.  Past Medical History  Diagnosis Date  . Hypertension   . Hyperlipidemia   . Gilbert's disease   . Unspecified septicemia   . 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 Unspecified  tinnitus     right  . Asthma     Past Surgical History  Procedure Laterality Date  . Neck surgery    . Back surgery    . Nasal sinus surgery  08/2011  . Knee surgery Bilateral 2005    replacement,partial knee replacement because of bacterial infection-2012  . Cardiovascular stress test  05/26/2012    EKG negative for ischemia, no ECG changes, no significant ischemia noted  . Transthoracic echocardiogram  05/26/2012    EF >55%, mild concentric LVH  . Cardiac catheterization N/A 03/01/2016    Procedure: Left Heart Cath and Coronary Angiography; Surgeon: Tonny Bollman, MD; oLAD 95%, CFX/RI/RCA/RPDA 25-30%  . Cardiac catheterization N/A 03/01/2016    Procedure: Coronary Stent Intervention; Surgeon: Tonny Bollman, MD; Promus Premier 4.0 x 12 mm DES to oLAD     Current Outpatient Prescriptions  Medication Sig Dispense Refill  . albuterol (PROAIR HFA) 108 (90 BASE) MCG/ACT inhaler 2 puffs every 4 hours as needed only if your can't catch your breath 1 Inhaler 1  . aspirin 81 MG tablet Take 81 mg by mouth daily.    Marland Kitchen atorvastatin (LIPITOR) 80 MG tablet Take 1 tablet (80 mg total) by mouth daily at 6 PM. 30 tablet 30  . cetirizine (ZYRTEC) 10 MG tablet Take 10 mg by mouth daily.    . fluticasone (FLONASE) 50 MCG/ACT nasal spray Place 2 sprays into both nostrils daily.    . Garlic 1000 MG CAPS Take by mouth daily.    Marland Kitchen  losartan (COZAAR) 50 MG tablet Take 0.5 tablets (25 mg total) by mouth daily.    . metoprolol tartrate (LOPRESSOR) 25 MG tablet Take 0.5 tablets (12.5 mg total) by mouth 2 (two) times daily.    . mometasone-formoterol (DULERA) 200-5 MCG/ACT AERO Take 2 puffs first thing in am and then another 2 puffs about 12 hours later. 1 Inhaler 11  . Multiple Vitamin (MULTIVITAMIN WITH MINERALS) TABS tablet Take 1 tablet by mouth daily.    . nitroGLYCERIN (NITROSTAT) 0.4  MG SL tablet Place 1 tablet (0.4 mg total) under the tongue every 5 (five) minutes x 3 doses as needed for chest pain. 25 tablet 12  . omeprazole (PRILOSEC) 40 MG capsule Take 1 capsule (40 mg total) by mouth 2 (two) times daily. 60 capsule 2  . ticagrelor (BRILINTA) 90 MG TABS tablet Take 1 tablet (90 mg total) by mouth 2 (two) times daily. 60 tablet 11   No current facility-administered medications for this visit.    Allergies: Oxycontin; Percocet; and Ambien    Social History: The patient  reports that he quit smoking about 47 years ago. His smoking use included Cigarettes. He quit after 3 years of use. He has never used smokeless tobacco. He reports that he drinks alcohol. He reports that he does not use illicit drugs.   Family History: The patient's family history includes Alzheimer's disease in his father; Dementia in his father; Heart disease in his mother; Heart disease (age of onset: 88) in his brother.    ROS: Please see the history of present illness. Otherwise, review of systems are positive for none. All other systems are reviewed and negative.    PHYSICAL EXAM: VS: BP 118/72 mmHg  Pulse 51  Ht 6\' 3"  (1.905 m)  Wt 207 lb 3.2 oz (93.985 kg)  BMI 25.90 kg/m2 , BMI Body mass index is 25.9 kg/(m^2). GENERAL: Well appearing HEENT: Pupils equal round and reactive, fundi not visualized, oral mucosa unremarkable NECK: No jugular venous distention, waveform within normal limits, carotid upstroke brisk and symmetric, no bruits, no thyromegaly LYMPHATICS: No cervical, inguinal adenopathy LUNGS: Clear to auscultation bilaterally BACK: No CVA tenderness CHEST: Unremarkable HEART: PMI not displaced or sustained,S1 and S2 within normal limits, no S3, no S4, no clicks, no rubs, no murmurs ABD: Flat, positive bowel sounds normal in frequency in pitch, no bruits, no rebound, no guarding, no midline pulsatile mass, no hepatomegaly, no splenomegaly EXT:  2 plus pulses throughout, no edema, no cyanosis no clubbing SKIN: No rashes no nodules NEURO: Cranial nerves II through XII grossly intact, motor grossly intact throughout PSYCH: Cognitively intact, oriented to person place and time    EKG: EKG is ordered today. The ekg ordered today demonstrates Sinus rhythm, rate 51, axis within normal limits, intervals within normal limits, no acute ST-T wave changes.   Recent Labs: 03/01/2016: B Natriuretic Peptide 51.0; Magnesium 1.9 03/02/2016: ALT 18; BUN 12; Creatinine, Ser 0.75; Hemoglobin 16.1; Platelets 219; Potassium 3.7; Sodium 139    Lipid Panel  Labs (Brief)       Component Value Date/Time   CHOL 165 03/01/2016 0448   TRIG 85 03/01/2016 0448   HDL 55 03/01/2016 0448   CHOLHDL 3.0 03/01/2016 0448   VLDL 17 03/01/2016 0448   LDLCALC 93 03/01/2016 0448       Wt Readings from Last 3 Encounters:  05/14/16 207 lb 3.2 oz (93.985 kg)  04/09/16 206 lb 11.2 oz (93.759 kg)  03/10/16 206 lb (93.441 kg)  Other studies Reviewed: Additional studies/ records that were reviewed today include: Cath films. Review of the above records demonstrates: Please see elsewhere in the note.    ASSESSMENT AND PLAN:  UNSTABLE ANGINA: Current symptoms need to be considered is unstable angina. He'll be transported to: For cardiac catheterization today. The patient understands that risks included but are not limited to stroke (1 in 1000), death (1 in 1000), kidney failure [usually temporary] (1 in 500), bleeding (1 in 200), allergic reaction [possibly serious] (1 in 200). The patient understands and agrees to proceed.   DYSLIPIDEMIA: He will currently continue the meds as listed.   Current medicines are reviewed at length with the patient today. The patient does not have concerns regarding medicines.  The following changes have been made: no change  Labs/ tests ordered today include: Admit for  cath  No orders of the defined types were placed in this encounter.    Disposition: FU with Dr. Rennis GoldenHilty    Signed, Rollene RotundaJames Hochrein, MD  05/14/2016 11:31 AM  [facility]      Complete H&P performed by Dr. Antoine PocheHochrein. Directly admitted from the office for unstable angina. Will proceed with cardiac catheterization later today. Start Heparin per pharmacy consult.  Signed, Ellsworth LennoxBrittany M Bauer Ausborn, PA-C 05/14/2016, 1:27 PM Pager: [phone]

## 2016-05-15 ENCOUNTER — Encounter (HOSPITAL_COMMUNITY): Payer: Self-pay | Admitting: Cardiovascular Disease

## 2016-05-15 ENCOUNTER — Encounter (HOSPITAL_COMMUNITY): Payer: Medicare Other

## 2016-05-15 DIAGNOSIS — I25110 Atherosclerotic heart disease of native coronary artery with unstable angina pectoris: Secondary | ICD-10-CM | POA: Diagnosis not present

## 2016-05-15 DIAGNOSIS — J45909 Unspecified asthma, uncomplicated: Secondary | ICD-10-CM | POA: Diagnosis not present

## 2016-05-15 DIAGNOSIS — I200 Unstable angina: Secondary | ICD-10-CM

## 2016-05-15 DIAGNOSIS — I252 Old myocardial infarction: Secondary | ICD-10-CM | POA: Diagnosis not present

## 2016-05-15 DIAGNOSIS — E785 Hyperlipidemia, unspecified: Secondary | ICD-10-CM | POA: Diagnosis not present

## 2016-05-15 DIAGNOSIS — R001 Bradycardia, unspecified: Secondary | ICD-10-CM

## 2016-05-15 DIAGNOSIS — I10 Essential (primary) hypertension: Secondary | ICD-10-CM | POA: Diagnosis not present

## 2016-05-15 LAB — BASIC METABOLIC PANEL
ANION GAP: 7 (ref 5–15)
BUN: 15 mg/dL (ref 6–20)
CALCIUM: 8.9 mg/dL (ref 8.9–10.3)
CO2: 27 mmol/L (ref 22–32)
CREATININE: 0.65 mg/dL (ref 0.61–1.24)
Chloride: 106 mmol/L (ref 101–111)
GLUCOSE: 106 mg/dL — AB (ref 65–99)
Potassium: 3.3 mmol/L — ABNORMAL LOW (ref 3.5–5.1)
Sodium: 140 mmol/L (ref 135–145)

## 2016-05-15 LAB — CBC
HCT: 43.8 % (ref 39.0–52.0)
HEMOGLOBIN: 14.4 g/dL (ref 13.0–17.0)
MCH: 30.2 pg (ref 26.0–34.0)
MCHC: 32.9 g/dL (ref 30.0–36.0)
MCV: 91.8 fL (ref 78.0–100.0)
PLATELETS: 193 10*3/uL (ref 150–400)
RBC: 4.77 MIL/uL (ref 4.22–5.81)
RDW: 13.9 % (ref 11.5–15.5)
WBC: 5.4 10*3/uL (ref 4.0–10.5)

## 2016-05-15 LAB — TROPONIN I: Troponin I: <0.03 ng/mL (ref <0.031)

## 2016-05-15 MED ORDER — ISOSORBIDE MONONITRATE ER 30 MG PO TB24: 30.0000 mg | ORAL_TABLET | Freq: Every day | ORAL | Status: DC

## 2016-05-15 MED ORDER — POTASSIUM CHLORIDE CRYS ER 20 MEQ PO TBCR
40.0000 meq | EXTENDED_RELEASE_TABLET | Freq: Once | ORAL | Status: AC
  Administered 2016-05-15: 40 meq via ORAL
  Filled 2016-05-15: qty 2

## 2016-05-15 NOTE — Progress Notes (Signed)
     SUBJECTIVE: No chest pain this am.   Tele: sinus  BP 123/97 mmHg  Pulse 50  Temp(Src) 97.5 F (36.4 C) (Oral)  Resp 13  Ht 6\' 3"  (1.905 m)  Wt 206 lb (93.441 kg)  BMI 25.75 kg/m2  SpO2 95%  Intake/Output Summary (Last 24 hours) at 05/15/16 0954 Last data filed at 05/15/16 0836  Gross per 24 hour  Intake    123 ml  Output   1250 ml  Net  -1127 ml    PHYSICAL EXAM General: Well developed, well nourished, in no acute distress. Alert and oriented x 3.  Psych:  Good affect, responds appropriately Neck: No JVD. No masses noted.  Lungs: Clear bilaterally with no wheezes or rhonci noted.  Heart: RRR with no murmurs noted. Abdomen: Bowel sounds are present. Soft, non-tender.  Extremities: No lower extremity edema.   LABS: Basic Metabolic Panel:  Recent Labs  40/98/1104/12/30 1422 05/15/16 0131  NA 141 140  K 3.9 3.3*  CL 107 106  CO2 30 27  GLUCOSE 90 106*  BUN 15 15  CREATININE 0.84 0.65  CALCIUM 9.1 8.9   CBC:  Recent Labs  05/14/16 1422 05/15/16 0131  WBC 5.1 5.4  HGB 14.9 14.4  HCT 44.5 43.8  MCV 90.4 91.8  PLT 194 193   Cardiac Enzymes:  Recent Labs  05/14/16 1422 05/14/16 2057 05/15/16 0131  TROPONINI <0.03 <0.03 <0.03   Current Meds: . aspirin EC  81 mg Oral Daily  . atorvastatin  80 mg Oral q1800  . fluticasone  2 spray Each Nare Daily  . isosorbide mononitrate  30 mg Oral Daily  . loratadine  10 mg Oral Daily  . losartan  25 mg Oral Daily  . metoprolol tartrate  25 mg Oral BID  . mometasone-formoterol  2 puff Inhalation BID  . multivitamin with minerals  1 tablet Oral Daily  . pantoprazole  40 mg Oral Daily  . sodium chloride flush  3 mL Intravenous Q12H  . ticagrelor  90 mg Oral BID     ASSESSMENT AND PLAN:  1. CAD/Unstable angina:  Pt admitted with chest pain worrisome for unstable angina. Troponin negative x 3. Cardiac cath per Dr. Tresa EndoKelly 05/14/16 and pt found to have moderately severe stenosis in the ostium of the intermediate  branch which is jailed by the proximal LAD stent struts. No PCI performed as this did not appear to be flow limiting. Any PCI of the ostial intermediate branch would potentially disrupt the LAD stent. He is having no chest pain this am. He is now on Imdur. Will continue current meds. Ambulate. If he is stable today, will d/c later to home.   Verne Carrowhristopher Mitsuru Dault  6/2/20179:54 AM

## 2016-05-15 NOTE — Care Management Obs Status (Signed)
MEDICARE OBSERVATION STATUS NOTIFICATION   Patient Details  Name: Louis Perez MRN: [id number] Date of Birth: 1951/03/06   Medicare Observation Status Notification Given:  Yes    Gala LewandowskyGraves-Bigelow, Pleasant Britz Kaye, RN 05/15/2016, 12:50 PM

## 2016-05-15 NOTE — Discharge Summary (Signed)
Discharge Summary    Patient ID: Louis Perez,  MRN: [id number], DOB/AGE: [birth year] 65 y.o.  Admit date: 05/14/2016 Discharge date: 05/15/2016  Primary Care Provider: BURNETT,BRENT A Primary Cardiologist: Dr. Rennis Golden  Discharge Diagnoses    Principal Problem:   Unstable angina [facility]) Active Problems:   Hypertension   Dyslipidemia   CAD (coronary artery disease), native coronary artery   Sinus bradycardia    Diagnostic Studies/Procedures    1. Cardiac catheterization this admission, please see full report and below for summary. _____________   History of Present Illness & Hospital Course    Louis Perez is a 65 y/o M with history of HTN, CAD (NSTEMI 02/2016 s/p DES to LAD), HLD who presented to [facility] as a direct admission from the office for chest pain. At prior [facility] he had nonobstructive disease elsewhere. He saw Dr. Antoine Poche yesterday as an add-on for chest and arm discomfort, similar to prior unstable angina but not as severe. At times he noticed some diaphoresis. It would last for only a minute or so. Due to concern for [place] and prior high risk anatomy (with prior recommendation to consider relook cath 6-12 mo), he was directly admitted to the hospital for cardiac cath. He was started on IV heparin. He underwent cath by Dr. Tresa Endo which showed widely patent LAD ostial stent now residual narrowing, and progressive 80% ostial ramus intermediate stenosis which appears to be jailed by the very proximal stent strut extending into the left main from the LAD ostium. He also had a normal LCx and dominant RCA with 20% smooth narrowing in the region of the acute marginal. Dr. Tresa Endo reviewed the films with Dr. Kelvin Cellar possible PTCA of the diagonal through the stent strut but there was felt to be risk that the dilated strut may then interfere with the circumflex takeoff.Dr. Clifton James further reviewed the films with Dr. Eldridge Dace who agreed that a trial of medical therapy was indicated rather  than PCI. Imdur was started. The patient was noted to be slightly bradycardic on metoprolol with HR in the 50s and occasional upper 40s (even with low dose of metoprolol held) so metoprolol was discontinued. The patient was observed and ambulated extensively this AM to assess for further angina and had none. He received KCl prior to d/c for K of 3.3 (3.9 on admission). Dr. Clifton James has seen and examined the patient today and feels he is stable for discharge. He is OK to return to cardiac rehab.  The patient will follow his HR and BP at home off metoprolol and bring in these readings to his f/u appt.   Consultants: N/A _____________  Discharge Vitals Blood pressure 123/97, pulse 50, temperature 97.5 F (36.4 C), temperature source Oral, resp. rate 13, height 6\' 3"  (1.905 m), weight 206 lb (93.441 kg), SpO2 95 %.  Filed Weights   05/14/16 1258 05/15/16 0555  Weight: 201 lb 11.2 oz (91.491 kg) 206 lb (93.441 kg)    Labs & Radiologic Studies    CBC  Recent Labs  05/14/16 1422 05/15/16 0131  WBC 5.1 5.4  HGB 14.9 14.4  HCT 44.5 43.8  MCV 90.4 91.8  PLT 194 193   Basic Metabolic Panel  Recent Labs  05/14/16 1422 05/15/16 0131  NA 141 140  K 3.9 3.3*  CL 107 106  CO2 30 27  GLUCOSE 90 106*  BUN 15 15  CREATININE 0.84 0.65  CALCIUM 9.1 8.9   Liver Function Tests  Recent Labs  05/14/16 1422  AST 27  ALT 32  ALKPHOS 86  BILITOT 2.0*  PROT 5.9*  ALBUMIN 3.5   Cardiac Enzymes  Recent Labs  05/14/16 1422 05/14/16 2057 05/15/16 0131  TROPONINI <0.03 <0.03 <0.03  _____________  No results found. Disposition   Pt is being discharged home today in good condition.  Follow-up Plans & Appointments    Follow-up Information    Follow up with Chrystie Nose, MD.   Specialty:  Cardiology   Why:  05/28/16 at 8:15am (your appointment from later in June was moved up)   Contact information:   [address] [place] [address] [phone]       Discharge Instructions    Diet - low sodium heart healthy    Complete by:  As directed      Increase activity slowly    Complete by:  As directed   No driving for 2 days. No lifting over 5 lbs for 1 week. No sexual activity for 1 week. Keep procedure site clean & dry. If you notice increased pain, swelling, bleeding or pus, call/return!  You may shower, but no soaking baths/hot tubs/pools for 1 week.   Please stop your metoprolol for now. Do not throw this away in case we want to restart it at your follow-up appointment.  You were started on a medicine called isosorbide (Imdur).  Patients taking aspirin and Brilinta should generally stay away from medicines like ibuprofen, Advil, Motrin, naproxen, and Aleve due to risk of stomach bleeding. You may take Tylenol as directed or talk to your primary doctor about alternatives.           Discharge Medications   Current Discharge Medication List    START taking these medications   Details  isosorbide mononitrate (IMDUR) 30 MG 24 hr tablet Take 1 tablet (30 mg total) by mouth daily. Qty: 30 tablet, Refills: 6      CONTINUE these medications which have NOT CHANGED   Details  albuterol (PROAIR HFA) 108 (90 BASE) MCG/ACT inhaler 2 puffs every 4 hours as needed only  if your can't catch your breath     aspirin 81 MG tablet Take 81 mg by mouth at bedtime.     atorvastatin (LIPITOR) 80 MG tablet Take 1 tablet (80 mg total) by mouth daily at 6 PM.     cetirizine (ZYRTEC) 10 MG tablet Take 10 mg by mouth daily.    fluticasone (FLONASE) 50 MCG/ACT nasal spray Place 2 sprays into both nostrils daily.    Garlic 1000 MG CAPS Take by mouth daily.    losartan (COZAAR) 50 MG tablet Take 0.5 tablets (25 mg total) by mouth daily.    mometasone-formoterol (DULERA) 200-5 MCG/ACT AERO Take 2 puffs first thing in am and then another 2 puffs about 12 hours later.     Multiple Vitamin (MULTIVITAMIN WITH MINERALS) TABS tablet Take 1 tablet by  mouth daily.    nitroGLYCERIN (NITROSTAT) 0.4 MG SL tablet Place 1 tablet (0.4 mg total) under the tongue every 5 (five) minutes x 3 doses as needed for chest pain.     omeprazole (PRILOSEC) 40 MG capsule Take 1 capsule (40 mg total) by mouth 2 (two) times daily.     ticagrelor (BRILINTA) 90 MG TABS tablet Take 1 tablet (90 mg total) by mouth 2 (two) times daily.       STOP taking these medications     metoprolol tartrate (LOPRESSOR) 25 MG tablet      naproxen sodium (  ANAPROX) 220 MG tablet          Allergies:  Allergies  Allergen Reactions  . Oxycontin [Oxycodone Hcl] Anaphylaxis  . Percocet [Oxycodone-Acetaminophen] Anaphylaxis    Tolerates Vicodin  . Ambien [Zolpidem Tartrate] Other (See Comments)    Mood changes      Outstanding Labs/Studies   N/A  Duration of Discharge Encounter   Greater than 30 minutes including physician time.  Signed, Laurann Montanaayna N Dunn PA-C 05/15/2016, 12:14 PM

## 2016-05-15 NOTE — Progress Notes (Signed)
Pt discharged home. Discharge instructions have been gone over with the patient. IV's removed. Pt given unit number and told to call if they have any concerns regarding their discharge instructions. Eevie Lapp V, RN   

## 2016-05-15 NOTE — Discharge Instructions (Signed)
Angina Pectoris  Angina pectoris, often called angina, is extreme discomfort in the chest, neck, or arm. This is caused by a lack of blood in the middle and thickest layer of the heart wall (myocardium). There are four types of angina:  · Stable angina. Stable angina usually occurs in episodes of predictable frequency and duration. It is usually brought on by physical activity, stress, or excitement. Stable angina usually lasts a few minutes and can often be relieved by a medicine that you place under your tongue. This medicine is called sublingual nitroglycerin.  · Unstable angina. Unstable angina can occur even when you are doing little or no physical activity. It can even occur while you are sleeping or when you are at rest. It can suddenly increase in severity or frequency. It may not be relieved by sublingual nitroglycerin, and it can last up to 30 minutes.  · Microvascular angina. This type of angina is caused by a disorder of tiny blood vessels called arterioles. Microvascular angina is more common in women. The pain may be more severe and last longer than other types of angina pectoris.  · Prinzmetal or variant angina. This type of angina pectoris is rare and usually occurs when you are doing little or no physical activity. It especially occurs in the early morning hours.  CAUSES  Atherosclerosis is the cause of angina. This is the buildup of fat and cholesterol (plaque) on the inside of the arteries. Over time, the plaque may narrow or block the artery, and this will lessen blood flow to the heart. Plaque can also become weak and break off within a coronary artery to form a clot and cause a sudden blockage.  RISK FACTORS  Risk factors common to both men and women include:  · High cholesterol levels.  · High blood pressure (hypertension).  · Tobacco use.  · Diabetes.  · Family history of angina.  · Obesity.  · Lack of exercise.  · A diet high in saturated fats.  Women are at greater risk for angina if they  are:  · Over age 55.  · Postmenopausal.  SYMPTOMS  Many people do not experience any symptoms during the early stages of angina. As the condition progresses, symptoms common to both men and women may include:  · Chest pain.    The pain can be described as a crushing or squeezing in the chest, or a tightness, pressure, fullness, or heaviness in the chest.    The pain can last more than a few minutes, or it can stop and recur.  · Pain in the arms, neck, jaw, or back.  · Unexplained heartburn or indigestion.  · Shortness of breath.  · Nausea.  · Sudden cold sweats.  · Sudden light-headedness.  Many women have chest discomfort and some of the other symptoms. However, women often have different (atypical) symptoms, such as:   · Fatigue.  · Unexplained feelings of nervousness or anxiety.  · Unexplained weakness.  · Dizziness or fainting.  Sometimes, women may have angina without any symptoms.  DIAGNOSIS   Tests to diagnose angina may include:  · ECG (electrocardiogram).  · Exercise stress test. This looks for signs of blockage when the heart is being exercised.  · Pharmacologic stress test. This test looks for signs of blockage when the heart is being stressed with a medicine.  · Blood tests.  · Coronary angiogram. This is a procedure to look at the coronary arteries to see if there is any blockage.    TREATMENT   The treatment of angina may include the following:  · Healthy behavioral changes to reduce or control risk factors.  · Medicine.  · Coronary stenting. A stent helps to keep an artery open.  · Coronary angioplasty. This procedure widens a narrowed or blocked artery.  · Coronary artery bypass surgery. This will allow your blood to pass the blockage (bypass) to reach your heart.  HOME CARE INSTRUCTIONS   · Take medicines only as directed by your health care provider.  · Do not take the following medicines unless your health care provider approves:    Nonsteroidal anti-inflammatory drugs (NSAIDs), such as ibuprofen,  naproxen, or celecoxib.    Vitamin supplements that contain vitamin A, vitamin E, or both.    Hormone replacement therapy that contains estrogen with or without progestin.  · Manage other health conditions such as hypertension and diabetes as directed by your health care provider.  · Follow a heart-healthy diet. A dietitian can help to educate you about healthy food options and changes.  · Use healthy cooking methods such as roasting, grilling, broiling, baking, poaching, steaming, or stir-frying. Talk to a dietitian to learn more about healthy cooking methods.  · Follow an exercise program approved by your health care provider.  · Maintain a healthy weight. Lose weight as approved by your health care provider.  · Plan rest periods when fatigued.  · Learn to manage stress.  · Do not use any tobacco products, including cigarettes, chewing tobacco, or electronic cigarettes. If you need help quitting, ask your health care provider.  · If you drink alcohol, and your health care provider approves, limit your alcohol intake to no more than 1 drink per day. One drink equals 12 ounces of beer, 5 ounces of wine, or 1½ ounces of hard liquor.  · Stop illegal drug use.  · Keep all follow-up visits as directed by your health care provider. This is important.  SEEK IMMEDIATE MEDICAL CARE IF:   · You have pain in your chest, neck, arm, jaw, stomach, or back that lasts more than a few minutes, is recurring, or is unrelieved by taking sublingual nitroglycerin.  · You have profuse sweating without cause.  · You have unexplained:    Heartburn or indigestion.    Shortness of breath or difficulty breathing.    Nausea or vomiting.    Fatigue.    Feelings of nervousness or anxiety.    Weakness.    Diarrhea.  · You have sudden light-headedness or dizziness.  · You faint.  These symptoms may represent a serious problem that is an emergency. Do not wait to see if the symptoms will go away. Get medical help right away. Call your local  emergency services (911 in the [place] Do not drive yourself to the hospital.     This information is not intended to replace advice given to you by your health care provider. Make sure you discuss any questions you have with your health care provider.     Document Released: 11/30/2005 Document Revised: 12/21/2014 Document Reviewed: 04/03/2014  [employer]vier Interactive Patient Education ©2016 [employer]

## 2016-05-18 ENCOUNTER — Encounter (HOSPITAL_COMMUNITY): Admission: RE | Admit: 2016-05-18 | Payer: Medicare Other | Source: Ambulatory Visit

## 2016-05-18 ENCOUNTER — Telehealth: Payer: Self-pay | Admitting: Internal Medicine

## 2016-05-18 NOTE — Telephone Encounter (Signed)
Returned patient call. Explained recommendations for doubling imdur until seen next. Advised on what to do in instances of unrelieved CP. Advised if he feels improvement and not having return of symptoms as he moves to higher imdur dose, he should be OK to participate in cardiac rehab, but should continue to notify facilitator/rehab RN of any CP, esp on exertion.  Recommended monitoring of BP and notification if his BPs start to run low/symptoms of dizziness, lightheadedness, etc. Recommended tylenol if headaches w/ higher dose of imdur or PRN NTG.  Pt voiced understanding of instructions, will follow up as scheduled w/ Dr. Rennis GoldenHilty if no additional problems or concerns.

## 2016-05-18 NOTE — Telephone Encounter (Signed)
Returned call to Cardiac Rehab. Essentially new patient to Dr. [name] - he saw in hospital in March 2017 for initial encounter and will see pt in office on 6/15.   This is 2nd hosp visit w/in 3 months. He had cath w PCI in March 2017. Was f/u for post-hosp visit by Louis Perez. I took the triage call for patient on 6/1 and he saw Dr. [name] as work-in for chest pain eval.  Dr. [name] sent to Louis Perez for same-day catheterization.  Pt had cath on 6/1 w/o additional stenting, d/c from hospital following day. Was started on Imdur for Dx of unstable angina.  Since this time had 3 episodes of chest pain for which he took NTG SL w/ relief. Nurse at Cardiac Rehab called to report these findings. Noted the CP occurred at rest.  Requesting a sooner appt than 6/15 for the patient. Advised pt to return home and not participate in Cardiac Rehab today. Notes that the patient had until this week done well w/ cardiac rehab and no episodes of CP. Aware Dr. [name]'s next scheduled office day 6/14. Would need to schedule pt w APP. Will get recommendations from Louis Perez & contact patient.

## 2016-05-18 NOTE — Telephone Encounter (Signed)
June 14 is probably OK. Let's try to double the isosorbide dose until then. Needs to go to ED for CP> 20 minutes not relieved by NTG, please

## 2016-05-18 NOTE — Telephone Encounter (Signed)
Follow up    [name]

## 2016-05-18 NOTE — Telephone Encounter (Signed)
Left msg for patient to call. 

## 2016-05-20 ENCOUNTER — Encounter (HOSPITAL_COMMUNITY): Payer: Medicare Other

## 2016-05-22 ENCOUNTER — Encounter (HOSPITAL_COMMUNITY): Payer: Medicare Other

## 2016-05-25 ENCOUNTER — Encounter (HOSPITAL_COMMUNITY)
Admission: RE | Admit: 2016-05-25 | Discharge: 2016-05-25 | Disposition: A | Discharge status: Home / Self Care | Payer: Medicare Other | Source: Ambulatory Visit | Attending: Internal Medicine | Admitting: Internal Medicine

## 2016-05-25 DIAGNOSIS — I214 Non-ST elevation (NSTEMI) myocardial infarction: Secondary | ICD-10-CM | POA: Diagnosis not present

## 2016-05-25 NOTE — Progress Notes (Signed)
Daily Session Note  Patient Details  Name: RIHAAN BARRACK MRN: [id number] Date of Birth: 1951/09/12 Referring Provider:        CARDIAC REHAB PHASE II ORIENTATION from 04/07/2016 in [place]   Referring Provider  Dr. Debara Pickett      Encounter Date: 05/25/2016  Check In:     Session Check In - 05/25/16 0824    Check-In   Location [facility]   Staff Present Russella Dar, MS, EP, [facility], Exercise Physiologist;Gregory Luther Parody, BS, EP, Exercise Physiologist;Davinder Haff Oletta Lamas, RN, BSN   Supervising physician immediately available to respond to emergencies See telemetry face sheet for immediately available MD   Medication changes reported     No   Fall or balance concerns reported    No   Warm-up and Cool-down Performed as group-led instruction   Resistance Training Performed Yes   VAD Patient? No   Pain Assessment   Currently in Pain? No/denies      Capillary Blood Glucose: No results found for this or any previous visit (from the past 24 hour(s)).   Goals Met:  Independence with exercise equipment Exercise tolerated well No report of cardiac concerns or symptoms Strength training completed today  Goals Unmet:  Not Applicable  Comments: check out 0915   Dr. Kate Sable is Medical Director for [place] and Pulmonary Rehab.

## 2016-05-27 ENCOUNTER — Encounter (HOSPITAL_COMMUNITY): Payer: Medicare Other

## 2016-05-28 ENCOUNTER — Encounter: Payer: Self-pay | Admitting: Internal Medicine

## 2016-05-28 ENCOUNTER — Ambulatory Visit (INDEPENDENT_AMBULATORY_CARE_PROVIDER_SITE_OTHER): Payer: Medicare Other | Admitting: Internal Medicine

## 2016-05-28 VITALS — BP 112/88 | HR 57 | Ht 75.0 in | Wt 204.2 lb

## 2016-05-28 DIAGNOSIS — I200 Unstable angina: Secondary | ICD-10-CM | POA: Diagnosis not present

## 2016-05-28 DIAGNOSIS — I25110 Atherosclerotic heart disease of native coronary artery with unstable angina pectoris: Secondary | ICD-10-CM | POA: Diagnosis not present

## 2016-05-28 DIAGNOSIS — R079 Chest pain, unspecified: Secondary | ICD-10-CM | POA: Diagnosis not present

## 2016-05-28 DIAGNOSIS — E785 Hyperlipidemia, unspecified: Secondary | ICD-10-CM | POA: Diagnosis not present

## 2016-05-28 MED ORDER — RANOLAZINE ER 1000 MG PO TB12: 1000.0000 mg | ORAL_TABLET | Freq: Two times a day (BID) | ORAL | Status: DC

## 2016-05-28 NOTE — Patient Instructions (Addendum)
Your physician has recommended you make the following change in your medication...  1. START ranexa 1000mg  twice daily (samples provided - 4 packs) 2. TAKE isosorbide (imdur) 30mg  twice daily for 1 week then STOP morning dose   Your physician recommends that you schedule a follow-up appointment in ONE MONTH with Dr. Rennis GoldenHilty.   Dr. Rennis GoldenHilty has cleared you to go back to cardiac rehab

## 2016-05-28 NOTE — Progress Notes (Signed)
Cardiology Office Note   Date:  05/28/2016   ID:  Louis Perez, DOB 1951/05/27, MRN [id number]  PCP:  Delorse LekBURNETT,BRENT A, MD  Cardiologist:   Chrystie NoseKenneth C [name], MD   Chief Complaint  Patient presents with  . Hospitalization Follow-up    2 week visit - post cath  has experiences chest pain since discharge (imdur increased 6/5 - casues headache), chest pain feels similar to what he has had in the past, no NTG prn use since imdur dose increased; BP fluctuates, HR stable; lightheaded/dizzy w/position changes      History of Present Illness: Louis Perez is a 65 y.o. male who presents for evaluation of chest and back pain. Patient has a history of coronary disease with recent NSTEMI in March with DES stenting of the LAD.  He had non obstructive disease elsewhere.  EF was normal.    He was added to my schedule today for evaluation of chest and arm discomfort. This began on Sunday. It is similar to his previous unstable angina but not as severe. Still he describes the peak of his discomfort at 7 out of 10 in intensity. He gets across his chest. His left arm hurt. At times he has noticed some diaphoresis. It might last for only a minute. He has taken nitroglycerin and over the course of the last few days and it improves the symptoms. He describes it as somewhat heavy. He's not had any palpitations, presyncope or syncope. He's not describing any new shortness of breath, PND or orthopnea. He has brought the discomfort on a few times when he's been doing activities although this is been intermittent. He was actually able to do a little golfing yesterday without symptoms. However, prior to this he had discomfort with golfing building his children sandbox. This morning the pain was at rest. He still describes a little mid chest tightness.  Of note he has not had the symptoms since his stent.  I did review his catheterization. I looked at the images and he had a very high grade proximal LAD in  March with nonobstructive disease in the right coronary.  05/28/2016  Louis Perez is a 65 year old patient who I last saw in 2014. He did not see me in follow-up since that time and is seen a number of other providers in our practice, ultimately leading to presentation with an STEMI in March with a placement of a drug-eluting stent in the proximal LAD. Subsequent to that he had chest pain as described above and was referred back to the hospital for coronary evaluation. Catheterization indicated an 80% ostial ramus lesion at the area of partially jailed vessel due to the proximal LAD stent. It was felt that intervention could compromise the LAD therefore medical therapy was recommended. His isosorbide was doubled to 60 mg daily. Since then he reports he has had some improvement in chest discomfort but has significant headache.  Past Medical History  Diagnosis Date  . Hypertension   . Hyperlipidemia   . Gilbert's disease   . Bacterial septicemia (HCC) 01/2011    "both knee involved"  . 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 Unspecified tinnitus     right  . Coronary artery disease   . NSTEMI (non-ST elevated myocardial infarction) (HCC) 02/2016    [name]/notes 03/02/2016  . Asthma dx'd in 2014  . GERD (gastroesophageal reflux disease)   . Arthritis     "hips, lower back, knees, shoulders, hands" (05/14/2016)  . CAD (  coronary artery disease)     a. NSTEMI 02/2016 s/p DES to LAD. b. Relook cath due to angina/prior high risk anatomy 05/14/16 -> continued medical therapy given concern that a stent strut could inferere with Cx itself.  . Sinus bradycardia     Past Surgical History  Procedure Laterality Date  . Lumbar disc surgery  ~ 1980; 2000  . Back surgery    . Nasal sinus surgery  08/2011  . Total knee arthroplasty Bilateral 03/26/2004-09/15/2004     right-left  . Cardiovascular stress test  05/26/2012    EKG negative for ischemia, no ECG changes, no significant ischemia noted  . Transthoracic  echocardiogram  05/26/2012    EF >55%, mild concentric LVH  . Cardiac catheterization N/A 03/01/2016    Procedure: Left Heart Cath and Coronary Angiography;  Surgeon: Tonny Bollman, MD;  oLAD 95%, CFX/RI/RCA/RPDA 25-30%  . Cardiac catheterization N/A 03/01/2016    Procedure: Coronary Stent Intervention;  Surgeon: Tonny Bollman, MD;  Promus Premier 4.0 x  12 mm DES to oLAD  . I&d knee with poly exchange Bilateral 01/2011    & complete synovectomy of 3 compartments of the right total knee/notes 02/04/2011 (replacement,partial knee replacement because of bacterial infection)  . Joint replacement    . Knee arthroscopy Right ~ 2002  . Shoulder arthroscopy w/ rotator cuff repair Right 01/2015  . Cardiac catheterization N/A 05/14/2016    Procedure: Left Heart Cath and Coronary Angiography;  Surgeon: Lennette Bihari, MD;  Location: [facility] INVASIVE CV LAB;  Service: Cardiovascular;  Laterality: N/A;     Current Outpatient Prescriptions  Medication Sig Dispense Refill  . acetaminophen (TYLENOL) 500 MG tablet Take 500-1,000 mg by mouth 2 (two) times daily as needed.    Marland Kitchen albuterol (PROAIR HFA) 108 (90 BASE) MCG/ACT inhaler 2 puffs every 4 hours as needed only  if your can't catch your breath 1 Inhaler 1  . aspirin 81 MG tablet Take 81 mg by mouth at bedtime.     Marland Kitchen atorvastatin (LIPITOR) 80 MG tablet Take 1 tablet (80 mg total) by mouth daily at 6 PM. 30 tablet 30  . cetirizine (ZYRTEC) 10 MG tablet Take 10 mg by mouth daily.    . fluticasone (FLONASE) 50 MCG/ACT nasal spray Place 2 sprays into both nostrils daily.    . Garlic 1000 MG CAPS Take by mouth daily.    . isosorbide mononitrate (IMDUR) 30 MG 24 hr tablet Take 1 tablet by mouth twice daily for 1 week then decrease to 1 tablet in the evening.     Marland Kitchen losartan (COZAAR) 50 MG tablet Take 0.5 tablets (25 mg total) by mouth daily.    . mometasone-formoterol (DULERA) 200-5 MCG/ACT AERO Take 2 puffs first thing in am and then another 2 puffs about 12 hours  later. 1 Inhaler 11  . Multiple Vitamin (MULTIVITAMIN WITH MINERALS) TABS tablet Take 1 tablet by mouth daily.    . nitroGLYCERIN (NITROSTAT) 0.4 MG SL tablet Place 1 tablet (0.4 mg total) under the tongue every 5 (five) minutes x 3 doses as needed for chest pain. 25 tablet 12  . omeprazole (PRILOSEC) 40 MG capsule Take 1 capsule (40 mg total) by mouth 2 (two) times daily. 60 capsule 2  . ticagrelor (BRILINTA) 90 MG TABS tablet Take 1 tablet (90 mg total) by mouth 2 (two) times daily. 60 tablet 11  . ranolazine (RANEXA) 1000 MG SR tablet Take 1 tablet (1,000 mg total) by mouth 2 (two) times daily. 60  tablet 5   No current facility-administered medications for this visit.    Allergies:   Oxycontin; Percocet; and Ambien    Social History:  The patient  reports that he quit smoking about 47 years ago. His smoking use included Cigarettes. He has a 5 pack-year smoking history. He has never used smokeless tobacco. He reports that he drinks about 0.6 oz of alcohol per week. He reports that he does not use illicit drugs.   Family History:  The patient's family history includes Alzheimer's disease in his father; Dementia in his father; Heart disease in his mother; Heart disease (age of onset: 76) in his brother.    ROS:  Please see the history of present illness.   Otherwise, review of systems are positive for none.   All other systems are reviewed and negative.    PHYSICAL EXAM: VS:  BP 112/88 mmHg  Pulse 57  Ht  (1.905 m)  Wt 204 lb 3.2 oz (92.625 kg)  BMI 25.52 kg/m2 , BMI Body mass index is 25.52 kg/(m^2). GENERAL:  Well appearing HEENT:  Pupils equal round and reactive, fundi not visualized, oral mucosa unremarkable NECK:  No jugular venous distention, waveform within normal limits, carotid upstroke brisk and symmetric, no bruits, no thyromegaly LYMPHATICS:  No cervical, inguinal adenopathy LUNGS:  Clear to auscultation bilaterally BACK:  No CVA tenderness CHEST:   Unremarkable HEART:  PMI not displaced or sustained,S1 and S2 within normal limits, no S3, no S4, no clicks, no rubs, no murmurs ABD:  Flat, positive bowel sounds normal in frequency in pitch, no bruits, no rebound, no guarding, no midline pulsatile mass, no hepatomegaly, no splenomegaly EXT:  2 plus pulses throughout, no edema, no cyanosis no clubbing SKIN:  No rashes no nodules NEURO:  Cranial nerves II through XII grossly intact, motor grossly intact throughout PSYCH:  Cognitively intact, oriented to person place and time    EKG:   Sinus bradycardia at 57  Recent Labs: 03/01/2016: B Natriuretic Peptide 51.0; Magnesium 1.9 05/14/2016: ALT 32 05/15/2016: BUN 15; Creatinine, Ser 0.65; Hemoglobin 14.4; Platelets 193; Potassium 3.3*; Sodium 140    Lipid Panel    Component Value Date/Time   CHOL 165 03/01/2016 0448   TRIG 85 03/01/2016 0448   HDL 55 03/01/2016 0448   CHOLHDL 3.0 03/01/2016 0448   VLDL 17 03/01/2016 0448   LDLCALC 93 03/01/2016 0448      Wt Readings from Last 3 Encounters:  05/28/16 204 lb 3.2 oz (92.625 kg)  05/15/16 206 lb (93.441 kg)  05/14/16 207 lb 3.2 oz (93.985 kg)      Other studies Reviewed: Additional studies/ records that were reviewed today include: Cath films. Review of the above records demonstrates:  Please see elsewhere in the note.     ASSESSMENT AND PLAN:  Patient Active Problem List   Diagnosis Date Noted  . Sinus bradycardia 05/15/2016  . Unstable angina (HCC) 05/14/2016  . CAD (coronary artery disease), native coronary artery 05/14/2016  . Dyslipidemia 09/28/2013  . GERD (gastroesophageal reflux disease) 09/28/2013  . 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 Moderate persistent chronic asthma with acute exacerbation 06/05/2013  . Hypertension 06/05/2013   PLAN: 1. Mr. Haff returns today with continuing chest pain. He has managed to go back to rehabilitation and does not seem to have chest pain with exertion, it seems to come mostly at rest.  This is oftentimes early in the morning or late at night and may be notable when he wakes from sleep. Is  not clear whether this is angina or perhaps a neuropathic pain in the chest, but he has had some improvement with nitrates. Despite this, he is having some headaches. I like to start him on Ranexa 1000 mg twice a day today. After 1 week will decrease his imdur to 30 mg at night. Hopefully he will have some improvement in his chest pain with this combination. Follow-up with me in one month.  Orders Placed This Encounter  Procedures  . EKG 12-Lead   Chrystie Nose, MD, [facility] Attending Cardiologist [facility]  Chrystie Nose, MD  05/28/2016 7:27 PM

## 2016-05-29 ENCOUNTER — Encounter (HOSPITAL_COMMUNITY)
Admission: RE | Admit: 2016-05-29 | Discharge: 2016-05-29 | Disposition: A | Discharge status: Home / Self Care | Payer: Medicare Other | Source: Ambulatory Visit | Attending: Internal Medicine | Admitting: Internal Medicine

## 2016-05-29 DIAGNOSIS — I214 Non-ST elevation (NSTEMI) myocardial infarction: Secondary | ICD-10-CM | POA: Diagnosis not present

## 2016-05-29 NOTE — Progress Notes (Signed)
Daily Session Note  Patient Details  Name: Louis Perez MRN: [id number] Date of Birth: Jun 09, 1951 Referring Provider:        CARDIAC REHAB PHASE II ORIENTATION from 04/07/2016 in [place]   Referring Provider  Dr. Debara Pickett      Encounter Date: 05/29/2016  Check In:     Session Check In - 05/29/16 0815    Check-In   Location [facility]   Staff Present Diane Angelina Pih, MS, EP, [facility], Exercise Physiologist;Terrance Lanahan Luther Parody, BS, EP, Exercise Physiologist;Christy Oletta Lamas, RN, BSN   Supervising physician immediately available to respond to emergencies See telemetry face sheet for immediately available MD   Medication changes reported     No   Fall or balance concerns reported    No   Warm-up and Cool-down Performed as group-led instruction   Resistance Training Performed Yes   VAD Patient? No   Pain Assessment   Currently in Pain? No/denies   Pain Score 0-No pain   Multiple Pain Sites No      Capillary Blood Glucose: No results found for this or any previous visit (from the past 24 hour(s)).   Goals Met:  Independence with exercise equipment Exercise tolerated well No report of cardiac concerns or symptoms Strength training completed today  Goals Unmet:  Not Applicable  Comments: Check out 0915   Dr. Kate Sable is Medical Director for [place] and Pulmonary Rehab.

## 2016-06-01 ENCOUNTER — Encounter (HOSPITAL_COMMUNITY)
Admission: RE | Admit: 2016-06-01 | Discharge: 2016-06-01 | Disposition: A | Discharge status: Home / Self Care | Payer: Medicare Other | Source: Ambulatory Visit | Attending: Internal Medicine | Admitting: Internal Medicine

## 2016-06-01 ENCOUNTER — Telehealth: Payer: Self-pay | Admitting: *Deleted

## 2016-06-01 ENCOUNTER — Other Ambulatory Visit: Payer: Self-pay

## 2016-06-01 ENCOUNTER — Telehealth: Payer: Self-pay

## 2016-06-01 DIAGNOSIS — I214 Non-ST elevation (NSTEMI) myocardial infarction: Secondary | ICD-10-CM | POA: Diagnosis not present

## 2016-06-01 MED ORDER — ISOSORBIDE MONONITRATE ER 30 MG PO TB24: ORAL_TABLET | ORAL | Status: DC

## 2016-06-01 MED ORDER — ISOSORBIDE MONONITRATE ER 30 MG PO TB24: 30.0000 mg | ORAL_TABLET | Freq: Every day | ORAL | Status: DC

## 2016-06-01 NOTE — Telephone Encounter (Signed)
Patient called pharmacy to see if he could get a refill on his imdur because he will be out tomorrow due to increase in dose. Pharmacist called us and said they will see if insurance allows refill but that patient wants a call from nurse to know what to do if its denied because then he will be out of pills and doesn't know if he can pay out of pocket.

## 2016-06-01 NOTE — Progress Notes (Signed)
Daily Session Note  Patient Details  Name: NAREK KNISS MRN: [id number] Date of Birth: Dec 02, 1951 Referring Provider:        CARDIAC REHAB PHASE II ORIENTATION from 04/07/2016 in [place]   Referring Provider  Dr. Debara Pickett      Encounter Date: 06/01/2016  Check In:     Session Check In - 06/01/16 0815    Check-In   Location [facility]   Staff Present Diane Angelina Pih, MS, EP, [facility], Exercise Physiologist;Jonella Redditt Luther Parody, BS, EP, Exercise Physiologist;Christy Oletta Lamas, RN, BSN   Supervising physician immediately available to respond to emergencies See telemetry face sheet for immediately available MD   Medication changes reported     No   Fall or balance concerns reported    No   Warm-up and Cool-down Performed as group-led instruction   Resistance Training Performed Yes   VAD Patient? No   Pain Assessment   Currently in Pain? No/denies   Pain Score 0-No pain   Multiple Pain Sites No      Capillary Blood Glucose: No results found for this or any previous visit (from the past 24 hour(s)).   Goals Met:  Independence with exercise equipment Exercise tolerated well No report of cardiac concerns or symptoms Strength training completed today  Goals Unmet:  Not Applicable  Comments: Check out 0915   Dr. Kate Sable is Medical Director for [place] and Pulmonary Rehab.

## 2016-06-01 NOTE — Telephone Encounter (Signed)
Patient would like a call back from nurse about having headaches when taking medication.

## 2016-06-02 ENCOUNTER — Telehealth: Payer: Self-pay | Admitting: Internal Medicine

## 2016-06-02 NOTE — Telephone Encounter (Signed)
LM for patient to call back.   Another telephone note was created on 06/01/16 for patient regarding medication: Joanette GulaCandice A Price, CMA at 06/01/2016 2:00 PM     Status: Signed       Expand All Collapse All   Patient called pharmacy to see if he could get a refill on his imdur because he will be out tomorrow due to increase in dose. Pharmacist called us and said they will see if insurance allows refill but that patient wants a call from nurse to know what to do if its denied because then he will be out of pills and doesn't know if he can pay out of pocket.      -- med was refilled on 06/01/16

## 2016-06-02 NOTE — Telephone Encounter (Signed)
F/u  Pt returning RN phone call. Please call back and discuss.   

## 2016-06-02 NOTE — Telephone Encounter (Signed)
Lindell SparJenna M Elkins, RN at 06/02/2016 12:53 PM     Status: Signed       Expand All Collapse All   LM for patient to call back.   Another telephone note was created on 06/01/16 for patient regarding medication: Joanette GulaCandice A Price, CMA at 06/01/2016 2:00 PM     Status: Signed       Expand All Collapse All  Patient called pharmacy to see if he could get a refill on his imdur because he will be out tomorrow due to increase in dose. Pharmacist called us and said they will see if insurance allows refill but that patient wants a call from nurse to know what to do if its denied because then he will be out of pills and doesn't know if he can pay out of pocket.      -- med was refilled on 06/01/16            Josefina Doerin C Shoffner at 06/01/2016 4:40 PM     Status: Signed       Expand All Collapse All   Patient would like a call back from nurse about having headaches when taking medication.        Returned call to patient - he wakes up during the night w/a terrible headache r/t imdur. When he lays down, this is when his head throbs more. He thinks he would do better with taking imdur in the AM. Advised this is OK - he is still taking BID until Friday.  Med list updated.  Routed to MD as Lorain ChildesFYI

## 2016-06-03 ENCOUNTER — Encounter (HOSPITAL_COMMUNITY)
Admission: RE | Admit: 2016-06-03 | Discharge: 2016-06-03 | Disposition: A | Discharge status: Home / Self Care | Payer: Medicare Other | Source: Ambulatory Visit | Attending: Internal Medicine | Admitting: Internal Medicine

## 2016-06-03 DIAGNOSIS — I214 Non-ST elevation (NSTEMI) myocardial infarction: Secondary | ICD-10-CM | POA: Diagnosis not present

## 2016-06-03 NOTE — Progress Notes (Signed)
Daily Session Note  Patient Details  Name: Louis Perez MRN: [id number] Date of Birth: 1951/06/03 Referring Provider:        [place] from 04/07/2016 in [place]   Referring Provider  Dr. Debara Pickett      Encounter Date: 06/03/2016  Check In:     Session Check In - 06/03/16 0815    Check-In   Location [facility]   Staff Present Russella Dar, MS, EP, [facility], Exercise Physiologist;Laketa Sandoz Luther Parody, BS, EP, Exercise Physiologist   Supervising physician immediately available to respond to emergencies See telemetry face sheet for immediately available MD   Medication changes reported     No   Fall or balance concerns reported    No   Warm-up and Cool-down Performed as group-led instruction   Resistance Training Performed Yes   VAD Patient? No   Pain Assessment   Currently in Pain? No/denies   Pain Score 0-No pain   Multiple Pain Sites No      Capillary Blood Glucose: No results found for this or any previous visit (from the past 24 hour(s)).   Goals Met:  Independence with exercise equipment Exercise tolerated well No report of cardiac concerns or symptoms Strength training completed today  Goals Unmet:  Not Applicable  Comments: Check out 0915   Dr. Kate Sable is Medical Director for [place] and Pulmonary Rehab.

## 2016-06-05 ENCOUNTER — Encounter (HOSPITAL_COMMUNITY)
Admission: RE | Admit: 2016-06-05 | Discharge: 2016-06-05 | Disposition: A | Discharge status: Home / Self Care | Payer: Medicare Other | Source: Ambulatory Visit | Attending: Internal Medicine | Admitting: Internal Medicine

## 2016-06-05 DIAGNOSIS — I214 Non-ST elevation (NSTEMI) myocardial infarction: Secondary | ICD-10-CM | POA: Diagnosis not present

## 2016-06-05 NOTE — Progress Notes (Signed)
Daily Session Note  Patient Details  Name: DANYELL AWBREY MRN: [id number] Date of Birth: July 20, 1951 Referring Provider:        [place] from 04/07/2016 in [place]   Referring Provider  Dr. Debara Pickett      Encounter Date: 06/05/2016  Check In:     Session Check In - 06/05/16 0815    Check-In   Location [facility]   Staff Present Russella Dar, MS, EP, [facility], Exercise Physiologist;Addyson Traub Luther Parody, BS, EP, Exercise Physiologist   Supervising physician immediately available to respond to emergencies See telemetry face sheet for immediately available MD   Medication changes reported     No   Fall or balance concerns reported    No   Warm-up and Cool-down Performed as group-led instruction   Resistance Training Performed Yes   VAD Patient? No   Pain Assessment   Currently in Pain? No/denies   Pain Score 0-No pain   Multiple Pain Sites No      Capillary Blood Glucose: No results found for this or any previous visit (from the past 24 hour(s)).   Goals Met:  Independence with exercise equipment Exercise tolerated well No report of cardiac concerns or symptoms Strength training completed today  Goals Unmet:  Not Applicable  Comments: Check out 915   Dr. Kate Sable is Medical Director for [place] and Pulmonary Rehab.

## 2016-06-08 ENCOUNTER — Telehealth: Payer: Self-pay | Admitting: Internal Medicine

## 2016-06-08 ENCOUNTER — Encounter (HOSPITAL_COMMUNITY)
Admission: RE | Admit: 2016-06-08 | Discharge: 2016-06-08 | Disposition: A | Discharge status: Home / Self Care | Payer: Medicare Other | Source: Ambulatory Visit | Attending: Internal Medicine | Admitting: Internal Medicine

## 2016-06-08 DIAGNOSIS — I214 Non-ST elevation (NSTEMI) myocardial infarction: Secondary | ICD-10-CM | POA: Diagnosis not present

## 2016-06-08 MED ORDER — ISOSORBIDE MONONITRATE ER 30 MG PO TB24: 30.0000 mg | ORAL_TABLET | Freq: Two times a day (BID) | ORAL | Status: DC

## 2016-06-08 NOTE — Telephone Encounter (Signed)
Spoke with pt, he decreased his isosorbide to 30 mg in the morning as he was told on Friday.  Saturday after waking he developed the chest pain again, it last 10-20 seconds and can be at a level 7 out of 10.  He had no discomfort during the day Saturday but developed it again at about 4 pm. He took another isosorbide Saturday evening but the discomfort was off and on all night. Yesterday he had no discomfort until 7-8 pm, he again took another isosorbide. Today he feels good, no discomfort during the night and none today. He was able to participate in rehab with no problems. He wants to know if he should increase the isosorbie back to 30 mg twice daily and if he should use NTG for this discomfort. Will forward to dr hilty to review and advise

## 2016-06-08 NOTE — Telephone Encounter (Signed)
Spoke with pt, aware of dr hilty's recommendations.  

## 2016-06-08 NOTE — Progress Notes (Signed)
Daily Session Note  Patient Details  Name: Louis Perez MRN: [id number] Date of Birth: 1951/10/06 Referring Provider:        CARDIAC REHAB PHASE II ORIENTATION from 04/07/2016 in [facility]   Referring Provider  Dr. Debara Pickett      Encounter Date: 06/08/2016  Check In:     Session Check In - 06/08/16 0815    Check-In   Location [facility]   Staff Present Suzanne Boron, BS, EP, Exercise Physiologist;Christy Oletta Lamas, RN, BSN   Supervising physician immediately available to respond to emergencies See telemetry face sheet for immediately available MD   Medication changes reported     No   Fall or balance concerns reported    No   Warm-up and Cool-down Performed as group-led instruction   Resistance Training Performed Yes   VAD Patient? No   Pain Assessment   Currently in Pain? No/denies   Pain Score 0-No pain   Multiple Pain Sites No      Capillary Blood Glucose: No results found for this or any previous visit (from the past 24 hour(s)).   Goals Met:  Independence with exercise equipment Exercise tolerated well No report of cardiac concerns or symptoms Strength training completed today  Goals Unmet:  Not Applicable  Comments: Check out 915   Dr. Kate Sable is Medical Director for [facility].

## 2016-06-08 NOTE — Telephone Encounter (Signed)
New message    Pt c/o of Chest Pain: STAT if CP now or developed within 24 hours  1. Are you having CP right now? no  2. Are you experiencing any other symptoms (ex. SOB, nausea, vomiting, sweating)? no  3. How long have you been experiencing CP? Over weekend on Saturday am and evening, and all night   4. Is your CP continuous or coming and going? Coming and going  5. Have you taken Nitroglycerin? No    The pt seems to think if he take Imdur twice daily the pt is uncertain the pain only last for 20 seconds comes and goes the pt states it has when he is resting.    The pt just finished cardiac rehab had no problem this am.

## 2016-06-08 NOTE — Telephone Encounter (Signed)
Yes .. Go back up to 30 mg twice daily.  Dr. HRexene Edison

## 2016-06-08 NOTE — Progress Notes (Signed)
Cardiac Individual Treatment Plan  Patient Details  Name: Louis Perez MRN: [id number] Date of Birth: April 11, 1951 Referring Provider:        CARDIAC REHAB PHASE II ORIENTATION from 04/07/2016 in [facility] CARDIAC REHABILITATION   Referring Provider  Dr. Rennis Golden      Initial Encounter Date:       CARDIAC REHAB PHASE II ORIENTATION from 04/07/2016 in [place] CARDIAC REHABILITATION   Date  04/07/16   Referring Provider  Dr. Rennis Golden      Visit Diagnosis: No diagnosis found.  Patient's Home Medications on Admission:  Current outpatient prescriptions:  .  acetaminophen (TYLENOL) 500 MG tablet, Take 500-1,000 mg by mouth 2 (two) times daily as needed., Disp: , Rfl:  .  albuterol (PROAIR HFA) 108 (90 BASE) MCG/ACT inhaler, 2 puffs every 4 hours as needed only  if your can't catch your breath, Disp: 1 Inhaler, Rfl: 1 .  aspirin 81 MG tablet, Take 81 mg by mouth at bedtime. , Disp: , Rfl:  .  atorvastatin (LIPITOR) 80 MG tablet, Take 1 tablet (80 mg total) by mouth daily at 6 PM., Disp: 30 tablet, Rfl: 30 .  cetirizine (ZYRTEC) 10 MG tablet, Take 10 mg by mouth daily., Disp: , Rfl:  .  fluticasone (FLONASE) 50 MCG/ACT nasal spray, Place 2 sprays into both nostrils daily., Disp: , Rfl:  .  Garlic 1000 MG CAPS, Take by mouth daily., Disp: , Rfl:  .  isosorbide mononitrate (IMDUR) 30 MG 24 hr tablet, Take one tablet by mouth twice daily for one week (starting 05/28/16) then reduce to one tablet daily thereafter., Disp: 40 tablet, Rfl: 3 .  losartan (COZAAR) 50 MG tablet, Take 0.5 tablets (25 mg total) by mouth daily., Disp: , Rfl:  .  mometasone-formoterol (DULERA) 200-5 MCG/ACT AERO, Take 2 puffs first thing in am and then another 2 puffs about 12 hours later., Disp: 1 Inhaler, Rfl: 11 .  Multiple Vitamin (MULTIVITAMIN WITH MINERALS) TABS tablet, Take 1 tablet by mouth daily., Disp: , Rfl:  .  nitroGLYCERIN (NITROSTAT) 0.4 MG SL tablet, Place 1 tablet (0.4 mg total) under the tongue every 5  (five) minutes x 3 doses as needed for chest pain., Disp: 25 tablet, Rfl: 12 .  omeprazole (PRILOSEC) 40 MG capsule, Take 1 capsule (40 mg total) by mouth 2 (two) times daily., Disp: 60 capsule, Rfl: 2 .  ranolazine (RANEXA) 1000 MG SR tablet, Take 1 tablet (1,000 mg total) by mouth 2 (two) times daily., Disp: 60 tablet, Rfl: 5 .  ticagrelor (BRILINTA) 90 MG TABS tablet, Take 1 tablet (90 mg total) by mouth 2 (two) times daily., Disp: 60 tablet, Rfl: 11  Past Medical History: Past Medical History  Diagnosis Date  . Hypertension   . Hyperlipidemia   . Gilbert's disease   . Bacterial septicemia (HCC) 01/2011    "both knee involved"  . 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 Unspecified tinnitus     right  . Coronary artery disease   . NSTEMI (non-ST elevated myocardial infarction) (HCC) 02/2016    [name] 03/02/2016  . Asthma dx'd in 2014  . GERD (gastroesophageal reflux disease)   . Arthritis     "hips, lower back, knees, shoulders, hands" (05/14/2016)  . CAD (coronary artery disease)     a. NSTEMI 02/2016 s/p DES to LAD. b. Relook cath due to angina/prior high risk anatomy 05/14/16 -> continued medical therapy given concern that a stent strut could inferere with Cx  itself.  . Sinus bradycardia     Tobacco Use: History  Smoking status  . Former Smoker -- 1.00 packs/day for 5 years  . Types: Cigarettes  . Quit date: 12/14/1968  Smokeless tobacco  . Never Used    Labs:     Recent Review Flowsheet Data    Labs for ITP Cardiac and Pulmonary Rehab Latest Ref Rng 03/01/2016   Cholestrol 0 - 200 mg/dL 161   LDLCALC 0 - 99 mg/dL 93   HDL >09 mg/dL 55   Trlycerides <604 mg/dL 85   Hemoglobin V4U 4.8 - 5.6 % 5.3      Capillary Blood Glucose: No results found for: GLUCAP   Exercise Target Goals:    Exercise Program Goal: Individual exercise prescription set with THRR, safety & activity barriers. Participant demonstrates ability to understand and report RPE using BORG scale, to  self-measure pulse accurately, and to acknowledge the importance of the exercise prescription.  Exercise Prescription Goal: Starting with aerobic activity 30 plus minutes a day, 3 days per week for initial exercise prescription. Provide home exercise prescription and guidelines that participant acknowledges understanding prior to discharge.  Activity Barriers & Risk Stratification:     Activity Barriers & Cardiac Risk Stratification - 04/07/16 0900    Activity Barriers & Cardiac Risk Stratification   Activity Barriers None   Cardiac Risk Stratification High      6 Minute Walk:     6 Minute Walk      04/07/16 1636       6 Minute Walk   Phase Initial     Distance 1550 feet     Walk Time 6 minutes     # of Rest Breaks 0     MPH 2.93     METS 3.25     RPE 9     Perceived Dyspnea  9     VO2 Peak 14.1     Symptoms No     Resting HR 57 bpm     Resting BP 134/82 mmHg     Max Ex. HR 108 bpm     Max Ex. BP 146/92 mmHg     2 Minute Post BP 110/72 mmHg        Initial Exercise Prescription:     Initial Exercise Prescription - 04/07/16 0750    Date of Initial Exercise RX and Referring Provider   Date 04/07/16   Referring Provider Dr. Rennis Golden   Treadmill   MPH 2   Grade 0   Minutes 15   METs 2.5   NuStep   Level 2   Minutes 15   METs 2   Elliptical   Level 1   Speed 1   Minutes 15   METs 1.5   Prescription Details   Frequency (times per week) 3   Duration Progress to 30 minutes of continuous aerobic without signs/symptoms of physical distress   Intensity   THRR REST +  30   THRR 40-80% of Max Heartrate 559 [id number]   Ratings of Perceived Exertion 11-13   Perceived Dyspnea 0-4   Progression   Progression Continue progressive overload as per policy without signs/symptoms or physical distress.   Resistance Training   Training Prescription Yes   Weight 1   Reps 10-12      Perform Capillary Blood Glucose checks as needed.  Exercise Prescription Changes:       Exercise Prescription Changes      05/09/16 1100 05/20/16 1411 06/03/16 1500  Exercise Review   Progression Yes Yes      Response to Exercise   Blood Pressure (Admit) 112/82 mmHg 96/52 mmHg 124/80 mmHg     Blood Pressure (Exercise) 148/92 mmHg 112/62 mmHg 182/100 mmHg     Blood Pressure (Exit) 108/70 mmHg 98/60 mmHg 128/82 mmHg     Heart Rate (Admit) 53 bpm 85 bpm 65 bpm     Heart Rate (Exercise) 84 bpm 100 bpm 96 bpm     Heart Rate (Exit) 56 bpm 81 bpm 73 bpm     Symptoms No No no      Duration Progress to 30 minutes of continuous aerobic without signs/symptoms of physical distress Progress to 30 minutes of continuous aerobic without signs/symptoms of physical distress Progress to 30 minutes of continuous aerobic without signs/symptoms of physical distress     Intensity Rest + 30 Rest + 30 Rest + 30     Progression   Progression Continue to progress workloads to maintain intensity without signs/symptoms of physical distress. Continue to progress workloads to maintain intensity without signs/symptoms of physical distress. Continue to progress workloads to maintain intensity without signs/symptoms of physical distress.     Resistance Training   Training Prescription Yes Yes Yes     Weight 5 5 5      Reps 10-12 10-12 10-12     Interval Training   Interval Training No No No     Treadmill   MPH 3 2.2 3.2     Grade 0 0 0     Minutes 15 15 15      METs 3.29 2.5 3.4     NuStep   Level  3      Minutes  15      METs  1.9      Elliptical   Level 5 5 6      Speed 80 80 124     Minutes 15 15 15      METs 6.8 6.8 6.8     Home Exercise Plan   Plans to continue exercise at Home Home Home     Frequency Add 2 additional days to program exercise sessions. Add 2 additional days to program exercise sessions. Add 2 additional days to program exercise sessions.        Exercise Comments:      Exercise Comments      05/09/16 1200           Exercise Comments Patient is progressing  appropriately.            Discharge Exercise Prescription (Final Exercise Prescription Changes):     Exercise Prescription Changes - 06/03/16 1500    Response to Exercise   Blood Pressure (Admit) 124/80 mmHg   Blood Pressure (Exercise) 182/100 mmHg   Blood Pressure (Exit) 128/82 mmHg   Heart Rate (Admit) 65 bpm   Heart Rate (Exercise) 96 bpm   Heart Rate (Exit) 73 bpm   Symptoms no    Duration Progress to 30 minutes of continuous aerobic without signs/symptoms of physical distress   Intensity Rest + 30   Progression   Progression Continue to progress workloads to maintain intensity without signs/symptoms of physical distress.   Resistance Training   Training Prescription Yes   Weight 5   Reps 10-12   Interval Training   Interval Training No   Treadmill   MPH 3.2   Grade 0   Minutes 15   METs 3.4   Elliptical   Level 6   Speed 124  Minutes 15   METs 6.8   Home Exercise Plan   Plans to continue exercise at Home   Frequency Add 2 additional days to program exercise sessions.      Nutrition:  Target Goals: Understanding of nutrition guidelines, daily intake of sodium 1500mg , cholesterol 200mg , calories 30% from fat and 7% or less from saturated fats, daily to have 5 or more servings of fruits and vegetables.  Biometrics:     Pre Biometrics - 04/09/16 1648    Pre Biometrics   Height 6\' 3"  (1.905 m)   Weight 206 lb 11.2 oz (93.759 kg)   Waist Circumference 38.5 inches   Hip Circumference 40.5 inches   Waist to Hip Ratio 0.95 %   BMI (Calculated) 25.9   Triceps Skinfold 4 mm   % Body Fat 20.2 %   Grip Strength 84.3 kg   Flexibility 10.7 in   Single Leg Stand 60 seconds       Nutrition Therapy Plan and Nutrition Goals:     Nutrition Therapy & Goals - 04/07/16 0904    Intervention Plan   Intervention Nutrition handout(s) given to patient.   Expected Outcomes Short Term Goal: Understand basic principles of dietary content, such as calories, fat,  sodium, cholesterol and nutrients.      Nutrition Discharge: Rate Your Plate Scores:     Nutrition Assessments - 04/07/16 0905    MEDFICTS Scores   Pre Score 3      Nutrition Goals Re-Evaluation:   Psychosocial: Target Goals: Acknowledge presence or absence of depression, maximize coping skills, provide positive support system. Participant is able to verbalize types and ability to use techniques and skills needed for reducing stress and depression.  Initial Review & Psychosocial Screening:     Initial Psych Review & Screening - 04/07/16 1624    Family Dynamics   Good Support System? Yes   Barriers   Psychosocial barriers to participate in program There are no identifiable barriers or psychosocial needs.   Screening Interventions   Interventions Encouraged to exercise      Quality of Life Scores:     Quality of Life - 04/07/16 1651    Quality of Life Scores   Health/Function Pre 22.4 %   Socioeconomic Pre 29.25 %   Psych/Spiritual Pre 24 %   Family Pre 25.2 %   GLOBAL Pre 24.69 %      PHQ-9:     Recent Review Flowsheet Data    Depression screen [facility]/9 04/07/2016   Decreased Interest 0   Down, Depressed, Hopeless 0   PHQ - 2 Score 0   Altered sleeping 0   Tired, decreased energy 0   Change in appetite 0   Feeling bad or failure about yourself  0   Trouble concentrating 0   Moving slowly or fidgety/restless 0   Suicidal thoughts 0   PHQ-9 Score 0      Psychosocial Evaluation and Intervention:     Psychosocial Evaluation - 04/07/16 1624    Psychosocial Evaluation & Interventions   Interventions Stress management education;Relaxation education;Encouraged to exercise with the program and follow exercise prescription   Continued Psychosocial Services Needed No      Psychosocial Re-Evaluation:     Psychosocial Re-Evaluation      04/10/16 1434 05/04/16 1419 06/08/16 1149       Psychosocial Re-Evaluation   Interventions Encouraged to attend  Cardiac Rehabilitation for the exercise Encouraged to attend Cardiac Rehabilitation for the exercise Relaxation education;Encouraged to attend  Cardiac Rehabilitation for the exercise;Stress management education     Continued Psychosocial Services Needed No No No        Vocational Rehabilitation: Provide vocational rehab assistance to qualifying candidates.   Vocational Rehab Evaluation & Intervention:     Vocational Rehab - 04/07/16 0901    Initial Vocational Rehab Evaluation & Intervention   Assessment shows need for Vocational Rehabilitation No      Education: Education Goals: Education classes will be provided on a weekly basis, covering required topics. Participant will state understanding/return demonstration of topics presented.  Learning Barriers/Preferences:     Learning Barriers/Preferences - 04/07/16 0900    Learning Barriers/Preferences   Learning Barriers None   Learning Preferences Skilled Demonstration      Education Topics: Hypertension, Hypertension Reduction -Define heart disease and high blood pressure. Discus how high blood pressure affects the body and ways to reduce high blood pressure.      CARDIAC REHAB PHASE II EXERCISE from 06/03/2016 in [place] CARDIAC REHABILITATION   Date  04/15/16   Educator  Hart Rochester   Instruction Review Code  2- meets goals/outcomes      Exercise and Your Heart -Discuss why it is important to exercise, the FITT principles of exercise, normal and abnormal responses to exercise, and how to exercise safely.      CARDIAC REHAB PHASE II EXERCISE from 06/03/2016 in [place] CARDIAC REHABILITATION   Date  04/22/16   Educator  Hart Rochester   Instruction Review Code  2- meets goals/outcomes      Angina -Discuss definition of angina, causes of angina, treatment of angina, and how to decrease risk of having angina.      CARDIAC REHAB PHASE II EXERCISE from 06/03/2016 in [facility]IAC REH[facility]ITATION   Date  04/29/16    Educator  -- [D Coad]   Instruction Review Code  2- meets goals/outcomes      Cardiac Medications -Review what the following cardiac medications are used for, how they affect the body, and side effects that may occur when taking the medications.  Medications include Aspirin, Beta blockers, calcium channel blockers, ACE Inhibitors, angiotensin receptor blockers, diuretics, digoxin, and antihyperlipidemics.      CARDIAC REHAB PHASE II EXERCISE from 06/03/2016 in [place] CARDIAC REHABILITATION   Date  05/06/16   Educator  D Coad   Instruction Review Code  2- meets goals/outcomes      Congestive Heart Failure -Discuss the definition of CHF, how to live with CHF, the signs and symptoms of CHF, and how keep track of weight and sodium intake.   Heart Disease and Intimacy -Discus the effect sexual activity has on the heart, how changes occur during intimacy as we age, and safety during sexual activity.   Smoking Cessation / COPD -Discuss different methods to quit smoking, the health benefits of quitting smoking, and the definition of COPD.   Nutrition I: Fats -Discuss the types of cholesterol, what cholesterol does to the heart, and how cholesterol levels can be controlled.          CARDIAC REHAB PHASE II EXERCISE from 06/03/2016 in [place] CARDIAC REHABILITATION   Date  06/03/16   Educator  Hart Rochester   Instruction Review Code  2- meets goals/outcomes      Nutrition II: Labels -Discuss the different components of food labels and how to read food label   Heart Parts and Heart Disease -Discuss the anatomy of the heart, the pathway of blood circulation through the  heart, and these are affected by heart disease.   Stress I: Signs and Symptoms -Discuss the causes of stress, how stress may lead to anxiety and depression, and ways to limit stress.   Stress II: Relaxation -Discuss different types of relaxation techniques to limit stress.   Warning Signs of Stroke /  TIA -Discuss definition of a stroke, what the signs and symptoms are of a stroke, and how to identify when someone is having stroke.   Knowledge Questionnaire Score:     Knowledge Questionnaire Score - 04/07/16 0901    Knowledge Questionnaire Score   Pre Score 23/24      Core Components/Risk Factors/Patient Goals at Admission:     Personal Goals and Risk Factors at Admission - 04/07/16 0905    Core Components/Risk Factors/Patient Goals on Admission    Weight Management Weight Maintenance   Increase Strength and Stamina Yes   Intervention Provide advice, education, support and counseling about physical activity/exercise needs.   Expected Outcomes Achievement of increased cardiorespiratory fitness and enhanced flexibility, muscular endurance and strength shown through measurements of functional capacity and personal statement of participant.   Hypertension Yes   Intervention Provide education on lifestyle modifcations including regular physical activity/exercise, weight management, moderate sodium restriction and increased consumption of fresh fruit, vegetables, and low fat dairy, alcohol moderation, and smoking cessation.;Monitor prescription use compliance.   Expected Outcomes Long Term: Maintenance of blood pressure at goal levels.   Lipids Yes   Intervention Provide education and support for participant on nutrition & aerobic/resistive exercise along with prescribed medications to achieve LDL 70mg , HDL >40mg .   Expected Outcomes Long Term: Cholesterol controlled with medications as prescribed, with individualized exercise RX and with personalized nutrition plan. Value goals: LDL < 70mg , HDL > 40 mg.      Core Components/Risk Factors/Patient Goals Review:      Goals and Risk Factor Review      04/07/16 1624 04/10/16 1433 05/04/16 1417 06/08/16 1148     Core Components/Risk Factors/Patient Goals Review   Personal Goals Review Increase Strength and Stamina;Hypertension;Lipids   Increase Strength and Stamina;Hypertension;Lipids Increase Strength and Stamina;Hypertension;Lipids    Review  no progress. patient will start program 04/13/16 Patient is progressing will in the program. BP's have been WNL so far.   Pt has had episodes of CP in past 30 days with visits to cardiologist.  Has had some High BPs. Meds have been adjusted. Pt continues to be determined. support and encouragement given.     Expected Outcomes   increase strength, Controlled HTN, Lipids increase strength, Controlled HTN, Lipids       Core Components/Risk Factors/Patient Goals at Discharge (Final Review):      Goals and Risk Factor Review - 06/08/16 1148    Core Components/Risk Factors/Patient Goals Review   Personal Goals Review Increase Strength and Stamina;Hypertension;Lipids   Review Pt has had episodes of CP in past 30 days with visits to cardiologist.  Has had some High BPs. Meds have been adjusted. Pt continues to be determined. support and encouragement given.    Expected Outcomes increase strength, Controlled HTN, Lipids      ITP Comments:     ITP Comments      04/07/16 0901           ITP Comments patient is a 65 year old that lives with wife and enjoys playing golf play with old cars.  Patient shows no s/s of depression.  Comments:

## 2016-06-10 ENCOUNTER — Telehealth: Payer: Self-pay | Admitting: Internal Medicine

## 2016-06-10 ENCOUNTER — Ambulatory Visit: Payer: PRIVATE HEALTH INSURANCE | Admitting: Internal Medicine

## 2016-06-10 ENCOUNTER — Encounter (HOSPITAL_COMMUNITY)
Admission: RE | Admit: 2016-06-10 | Discharge: 2016-06-10 | Disposition: A | Discharge status: Home / Self Care | Payer: Medicare Other | Source: Ambulatory Visit | Attending: Internal Medicine | Admitting: Internal Medicine

## 2016-06-10 DIAGNOSIS — I214 Non-ST elevation (NSTEMI) myocardial infarction: Secondary | ICD-10-CM | POA: Diagnosis not present

## 2016-06-10 DIAGNOSIS — Z955 Presence of coronary angioplasty implant and graft: Secondary | ICD-10-CM

## 2016-06-10 NOTE — Progress Notes (Signed)
Daily Session Note  Patient Details  Name: Louis Perez MRN: [id number] Date of Birth: 12/11/1951 Referring Provider:        CARDIAC REHAB PHASE II ORIENTATION from 04/07/2016 in [place] CARDIAC REHABILITATION   Referring Provider  Dr. [name]      Encounter Date: 06/10/2016  Check In:     Session Check In - 06/10/16 0815    Check-In   Location [facility]   Staff Present Diane Coad, MS, EP, CHC, Exercise Physiologist;Gregory Cowan, BS, EP, Exercise Physiologist   Supervising physician immediately available to respond to emergencies See telemetry face sheet for immediately available MD   Medication changes reported     No   Fall or balance concerns reported    No   Warm-up and Cool-down Performed as group-led instruction   Resistance Training Performed Yes   VAD Patient? No   Pain Assessment   Currently in Pain? No/denies   Pain Score 0-No pain      Capillary Blood Glucose: No results found for this or any previous visit (from the past 24 hour(s)).   Goals Met:  Independence with exercise equipment Exercise tolerated well No report of cardiac concerns or symptoms Strength training completed today  Goals Unmet:  Not Applicable  Comments: Check out: 9:15   Dr. Suresh Koneswaran is Medical Director for  Cardiac and Pulmonary Rehab. 

## 2016-06-10 NOTE — Telephone Encounter (Signed)
New message     Pt c/o of Chest Pain: STAT if CP now or developed within 24 hours  1. Are you having CP right now? no  2. Are you experiencing any other symptoms (ex. SOB, nausea, vomiting, sweating)? no  3. How long have you been experiencing CP? For about week  4. Is your CP continuous or coming and going? Coming and going last 20 seconds  5. Have you taken Nitroglycerin? no ?  The pt had 1 at cardiac rehab this am

## 2016-06-10 NOTE — Telephone Encounter (Signed)
Received call from patient.He stated he started Imdur 30 mg twice a day this past Saturday and he continues to have frequent chest pain.Stated he had chest pain while walking on treadmill at cardiac rehab this morning.Stated he has chest pain day and night each episode lasting appox 20 sec.Spoke to Dr.[name] he advised no cardiac rehab.Advised he will speak to Dr.Cooper who did your cath with stenting and discuss with him about another cath.We will call you back after he talks to Dr.Cooper.

## 2016-06-10 NOTE — Telephone Encounter (Signed)
Kathlene NovemberMike- You did Louis Perez's cath in March and recommended a relook cath in 6-12 months due to high risk anatomy. 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 I've been increasing his anti-anginal medication but he continues to have short lived exertional chest pain and has not been able to participate at cardiac rehab. I think we may need to reconsider intervention.  Can you review the films again and let me know your thoughts?  Thanks.  -[place]

## 2016-06-12 ENCOUNTER — Telehealth: Payer: Self-pay | Admitting: Internal Medicine

## 2016-06-12 ENCOUNTER — Encounter (HOSPITAL_COMMUNITY)
Admission: RE | Admit: 2016-06-12 | Discharge: 2016-06-12 | Disposition: A | Discharge status: Home / Self Care | Payer: Medicare Other | Source: Ambulatory Visit | Attending: Internal Medicine | Admitting: Internal Medicine

## 2016-06-12 NOTE — Telephone Encounter (Signed)
Returned call to Louis Perez. Made him aware we are still waiting for Dr. Earmon Phoenixooper's recommendation regarding recatheterization. He is aware we will call when new information available. Asked patient if anything new or any acute concerns - patient denies at this time. He is aware to call if he needs.

## 2016-06-12 NOTE — Progress Notes (Deleted)
Daily Session Note  Patient Details  Name: Louis Perez MRN: [id number] Date of Birth: 02/15/1951 Referring Provider:        CARDIAC REHAB PHASE II ORIENTATION from 04/07/2016 in [facility]   Referring Provider  Dr. [name]      Encounter Date: 06/12/2016  Check In:     Session Check In - 06/12/16 0815    Check-In   Location [facility]   Staff Present Diane Coad, MS, EP, CHC, Exercise Physiologist;Ashrith Sagan, BS, EP, Exercise Physiologist   Supervising physician immediately available to respond to emergencies See telemetry face sheet for immediately available MD   Medication changes reported     No   Fall or balance concerns reported    No   Warm-up and Cool-down Performed as group-led instruction   Resistance Training Performed Yes   VAD Patient? No   Pain Assessment   Currently in Pain? No/denies   Pain Score 0-No pain   Multiple Pain Sites No      Capillary Blood Glucose: No results found for this or any previous visit (from the past 24 hour(s)).   Goals Met:  Independence with exercise equipment Exercise tolerated well No report of cardiac concerns or symptoms Strength training completed today  Goals Unmet:  Not Applicable  Comments: Check out 915   Dr. Suresh Koneswaran is Medical Director for [facility]. 

## 2016-06-12 NOTE — Telephone Encounter (Signed)
Fu  Pt was told to call today and speak w/ rN to follow up about possible cath. Please call back and discuss.

## 2016-06-15 ENCOUNTER — Telehealth: Payer: Self-pay | Admitting: *Deleted

## 2016-06-15 ENCOUNTER — Encounter (HOSPITAL_COMMUNITY): Payer: Medicare Other

## 2016-06-15 ENCOUNTER — Ambulatory Visit (INDEPENDENT_AMBULATORY_CARE_PROVIDER_SITE_OTHER): Payer: Medicare Other | Admitting: Internal Medicine

## 2016-06-15 ENCOUNTER — Encounter: Payer: Self-pay | Admitting: Internal Medicine

## 2016-06-15 VITALS — BP 154/90 | HR 61 | Ht 75.0 in | Wt 204.0 lb

## 2016-06-15 DIAGNOSIS — Z955 Presence of coronary angioplasty implant and graft: Secondary | ICD-10-CM

## 2016-06-15 DIAGNOSIS — I2510 Atherosclerotic heart disease of native coronary artery without angina pectoris: Secondary | ICD-10-CM

## 2016-06-15 DIAGNOSIS — R079 Chest pain, unspecified: Secondary | ICD-10-CM

## 2016-06-15 DIAGNOSIS — I200 Unstable angina: Secondary | ICD-10-CM

## 2016-06-15 MED ORDER — ISOSORBIDE MONONITRATE ER 30 MG PO TB24: ORAL_TABLET | ORAL | Status: DC

## 2016-06-15 NOTE — Telephone Encounter (Signed)
Spoke with patient regarding left heart cath scheduled  06/17/16 with Dr. Excell Seltzerooper at 11:30 am--arrive short stay center at 9:30 am NPO after midnight.  Will need a driver.  Patient voiced his understanding.

## 2016-06-15 NOTE — Telephone Encounter (Signed)
Pt c/o of Chest Pain: STAT if CP now or developed within 24 hours  1. Are you having CP right now? no  2. Are you experiencing any other symptoms (ex. SOB, nausea, vomiting, sweating)? No, has been SOB for a while.   3. How long have you been experiencing CP? Last few days- last was Sat- 7/1  4. Is your CP continuous or coming and going? Comes and goes   5. Have you taken Nitroglycerin? Yes  ?

## 2016-06-15 NOTE — Patient Instructions (Addendum)
   Per Dr. Rennis GoldenHilty & Dr. Excell Seltzerooper - please schedule [facility] heart [facility] -- arrive 1 hour prior to time specified for lab work  Your physician has recommended you make the following change in your medication:  -- INCREASE imdur to 60mg  in the morning and 30mg  at night   Your physician recommends that you schedule a follow-up appointment in AUGUST 2017 with Dr. Rennis GoldenHilty

## 2016-06-15 NOTE — Progress Notes (Signed)
    Cardiology Office Note   Date:  06/15/2016   ID:  Louis Perez, DOB 10/23/1951, MRN [id number]  PCP:  BURNETT,BRENT A, MD  Cardiologist:   [name] [name], MD   Chief Complaint  Patient presents with  . Follow-up    chest pain; stabbing pain. dizziness; due to medication.       History of Present Illness: Louis Perez is a 65 y.o. male who presents for evaluation of chest and back pain. Patient has a history of coronary disease with recent NSTEMI in March with DES stenting of the LAD.  He had non obstructive disease elsewhere.  EF was normal.    He was added to my schedule today for evaluation of chest and arm discomfort. This began on Sunday. It is similar to his previous unstable angina but not as severe. Still he describes the peak of his discomfort at 7 out of 10 in intensity. He gets across his chest. His left arm hurt. At times he has noticed some diaphoresis. It might last for only a minute. He has taken nitroglycerin and over the course of the last few days and it improves the symptoms. He describes it as somewhat heavy. He's not had any palpitations, presyncope or syncope. He's not describing any new shortness of breath, PND or orthopnea. He has brought the discomfort on a few times when he's been doing activities although this is been intermittent. He was actually able to do a little golfing yesterday without symptoms. However, prior to this he had discomfort with golfing building his children sandbox. This morning the pain was at rest. He still describes a little mid chest tightness.  Of note he has not had the symptoms since his stent.  I did review his catheterization. I looked at the images and he had a very high grade proximal LAD in March with nonobstructive disease in the right coronary.  05/28/2016  Louis Perez is a 65-year-old patient who I last saw in 2014. He did not see me in follow-up since that time and is seen a number of other providers in our  practice, ultimately leading to presentation with an STEMI in March with a placement of a drug-eluting stent in the proximal LAD. Subsequent to that he had chest pain as described above and was referred back to the hospital for coronary evaluation. Catheterization indicated an 80% ostial ramus lesion at the area of partially jailed vessel due to the proximal LAD stent. It was felt that intervention could compromise the LAD therefore medical therapy was recommended. His isosorbide was doubled to 60 mg daily. Since then he reports he has had some improvement in chest discomfort but has significant headache.  06/15/2016  Louis Perez was seen back today in the office for chest pain. When I last saw him we had increased his isosorbide up to 60 mg daily and he's been on Ranexa thousand milligrams twice a day. He reports he continues to have episodes of chest discomfort which can occur at rest or with exertion. I've advised that he stop cardiac rehabilitation until we can determine whether or not his pain is related to coronary obstruction.  Past Medical History  Diagnosis Date  . Hypertension   . Hyperlipidemia   . Gilbert's disease   . Bacterial septicemia (HCC) 01/2011    "both knee involved"  . 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 Unspecified tinnitus     right  . Coronary artery disease   .   NSTEMI (non-ST elevated myocardial infarction) (HCC) 02/2016    [name]/notes 03/02/2016  . Asthma dx'd in 2014  . GERD (gastroesophageal reflux disease)   . Arthritis     "hips, lower back, knees, shoulders, hands" (05/14/2016)  . CAD (coronary artery disease)     a. NSTEMI 02/2016 s/p DES to LAD. b. Relook cath due to angina/prior high risk anatomy 05/14/16 -> continued medical therapy given concern that a stent strut could inferere with Cx itself.  . Sinus bradycardia     Past Surgical History  Procedure Laterality Date  . Lumbar disc surgery  ~ 1980; 2000  . Back surgery    . Nasal sinus surgery  08/2011  .  Total knee arthroplasty Bilateral 03/26/2004-09/15/2004     right-left  . Cardiovascular stress test  05/26/2012    EKG negative for ischemia, no ECG changes, no significant ischemia noted  . Transthoracic echocardiogram  05/26/2012    EF >55%, mild concentric LVH  . Cardiac catheterization N/A 03/01/2016    Procedure: Left Heart Cath and Coronary Angiography;  Surgeon: Tonny BollmanMichael Cooper, MD;  oLAD 95%, CFX/RI/RCA/RPDA 25-30%  . Cardiac catheterization N/A 03/01/2016    Procedure: Coronary Stent Intervention;  Surgeon: Tonny BollmanMichael Cooper, MD;  Promus Premier 4.0 x  12 mm DES to oLAD  . I&d knee with poly exchange Bilateral 01/2011    & complete synovectomy of 3 compartments of the right total knee/notes 02/04/2011 (replacement,partial knee replacement because of bacterial infection)  . Joint replacement    . Knee arthroscopy Right ~ 2002  . Shoulder arthroscopy w/ rotator cuff repair Right 01/2015  . Cardiac catheterization N/A 05/14/2016    Procedure: Left Heart Cath and Coronary Angiography;  Surgeon: Lennette Biharihomas A Kelly, MD;  [facility];  Service: Cardiovascular;  Laterality: N/A;     Current Outpatient Prescriptions  Medication Sig Dispense Refill  . acetaminophen (TYLENOL) 500 MG tablet Take 500-1,000 mg by mouth 2 (two) times daily as needed.    Marland Kitchen. albuterol (PROAIR HFA) 108 (90 BASE) MCG/ACT inhaler 2 puffs every 4 hours as needed only  if your can't catch your breath 1 Inhaler 1  . aspirin 81 MG tablet Take 81 mg by mouth at bedtime.     Marland Kitchen. atorvastatin (LIPITOR) 80 MG tablet Take 1 tablet (80 mg total) by mouth daily at 6 PM. 30 tablet 30  . cetirizine (ZYRTEC) 10 MG tablet Take 10 mg by mouth daily.    . fluticasone (FLONASE) 50 MCG/ACT nasal spray Place 2 sprays into both nostrils daily.    . Garlic 1000 MG CAPS Take by mouth daily.    . isosorbide mononitrate (IMDUR) 30 MG 24 hr tablet Take 1 tablet (30 mg total) by mouth 2 (two) times daily. 180 tablet 3  . losartan (COZAAR) 50 MG  tablet Take 0.5 tablets (25 mg total) by mouth daily.    . mometasone-formoterol (DULERA) 200-5 MCG/ACT AERO Take 2 puffs first thing in am and then another 2 puffs about 12 hours later. 1 Inhaler 11  . Multiple Vitamin (MULTIVITAMIN WITH MINERALS) TABS tablet Take 1 tablet by mouth daily.    . nitroGLYCERIN (NITROSTAT) 0.4 MG SL tablet Place 1 tablet (0.4 mg total) under the tongue every 5 (five) minutes x 3 doses as needed for chest pain. 25 tablet 12  . omeprazole (PRILOSEC) 40 MG capsule Take 1 capsule (40 mg total) by mouth 2 (two) times daily. 60 capsule 2  . ranolazine (RANEXA) 1000 MG SR tablet  Take 1 tablet (1,000 mg total) by mouth 2 (two) times daily. 60 tablet 5  . ticagrelor (BRILINTA) 90 MG TABS tablet Take 1 tablet (90 mg total) by mouth 2 (two) times daily. 60 tablet 11   No current facility-administered medications for this visit.    Allergies:   Oxycontin; Percocet; and Ambien    Social History:  The patient  reports that he quit smoking about 47 years ago. His smoking use included Cigarettes. He has a 5 pack-year smoking history. He has never used smokeless tobacco. He reports that he drinks about 0.6 oz of alcohol per week. He reports that he does not use illicit drugs.   Family History:  The patient's family history includes Alzheimer's disease in his father; Dementia in his father; Heart disease in his mother; Heart disease (age of onset: 5460) in his brother.    ROS:  Please see the history of present illness.   Otherwise, review of systems are positive for none.   All other systems are reviewed and negative.    PHYSICAL EXAM: VS:  BP 154/90 mmHg  Pulse 61  Ht 6\' 3"  (1.905 m)  Wt 204 lb (92.534 kg)  BMI 25.50 kg/m2 , BMI Body mass index is 25.5 kg/(m^2). Deferred   EKG:   Normal sinus rhythm at 61, possible inferoposterior infarct pattern  Recent Labs: 03/01/2016: B Natriuretic Peptide 51.0; Magnesium 1.9 05/14/2016: ALT 32 05/15/2016: BUN 15; Creatinine, Ser  0.65; Hemoglobin 14.4; Platelets 193; Potassium 3.3*; Sodium 140    Lipid Panel    Component Value Date/Time   CHOL 165 03/01/2016 0448   TRIG 85 03/01/2016 0448   HDL 55 03/01/2016 0448   CHOLHDL 3.0 03/01/2016 0448   VLDL 17 03/01/2016 0448   LDLCALC 93 03/01/2016 0448      Wt Readings from Last 3 Encounters:  06/15/16 204 lb (92.534 kg)  05/28/16 204 lb 3.2 oz (92.625 kg)  05/15/16 206 lb (93.441 kg)      Other studies Reviewed: Additional studies/ records that were reviewed today include: Cath films. Review of the above records demonstrates:  Please see elsewhere in the note.     ASSESSMENT AND PLAN:  Patient Active Problem List   Diagnosis Date Noted  . Chest pain, unspecified 05/28/2016  . Sinus bradycardia 05/15/2016  . Unstable angina (HCC) 05/14/2016  . CAD (coronary artery disease), native coronary artery 05/14/2016  . Dyslipidemia 09/28/2013  . GERD (gastroesophageal reflux disease) 09/28/2013  . 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 Moderate persistent chronic asthma with acute exacerbation 06/05/2013  . Hypertension 06/05/2013   PLAN: Louis Perez continues to have chest pain symptoms both at rest and with exertion. This is despite maximal medical therapy with antianginal medicine. He is known to have an 80% ostial ramus stenosis with prior stent placed in the LAD. That stent seems to jail the ostium of the ramus vessel. This could make intervention difficult. I discussed the case with Dr. Excell Seltzerooper who placed his original stent and he felt that we could investigate the ramus vessel with FFR and if it was treated significance attempt a PCI based on his ongoing symptoms. He is available to do this on Wednesday this week. We will arrange for cardiac catheterization. I will increase his isosorbide to 60 mg every morning and 30 mg every afternoon.  Follow-up with me after his cath.  Chrystie NoseKenneth C. [name], MD, [facility] Attending Cardiologist [facility]  Chrystie NoseKenneth C [name], MD    06/15/2016 3:40 PM

## 2016-06-15 NOTE — Telephone Encounter (Signed)
SPOKE TO PATIENT  PATIENT STATES HE HAD TO USE NTG X 2 ON Saturday. HE USED NTG X 1 THEN CHEST PAIN RETURN ABOUT 15 MIN. WITH RADIATION TO ARM USED NTG X1 WITH RELIEF. PATIENT STATES HE IS TAKING RANEXA TWICE A DAY AND IMDUR 30 MG DAILY. APPOINTMENT SCHEDULE FOR TODAY AT 3:30 PM WITH DR [name].

## 2016-06-17 ENCOUNTER — Ambulatory Visit (HOSPITAL_COMMUNITY)
Admission: RE | Admit: 2016-06-17 | Discharge: 2016-06-17 | Disposition: A | Discharge status: Home / Self Care | Payer: Medicare Other | Source: Ambulatory Visit | Attending: Cardiovascular Disease | Admitting: Cardiovascular Disease

## 2016-06-17 ENCOUNTER — Encounter (HOSPITAL_COMMUNITY): Payer: Medicare Other

## 2016-06-17 ENCOUNTER — Encounter (HOSPITAL_COMMUNITY)
Admission: RE | Disposition: A | Discharge status: Home / Self Care | Payer: Self-pay | Source: Ambulatory Visit | Attending: Cardiovascular Disease

## 2016-06-17 DIAGNOSIS — Z7982 Long term (current) use of aspirin: Secondary | ICD-10-CM | POA: Diagnosis not present

## 2016-06-17 DIAGNOSIS — I25118 Atherosclerotic heart disease of native coronary artery with other forms of angina pectoris: Secondary | ICD-10-CM | POA: Diagnosis not present

## 2016-06-17 DIAGNOSIS — Z955 Presence of coronary angioplasty implant and graft: Secondary | ICD-10-CM | POA: Diagnosis not present

## 2016-06-17 DIAGNOSIS — J4541 Moderate persistent asthma with (acute) exacerbation: Secondary | ICD-10-CM | POA: Diagnosis not present

## 2016-06-17 DIAGNOSIS — Y840 Cardiac catheterization as the cause of abnormal reaction of the patient, or of later complication, without mention of misadventure at the time of the procedure: Secondary | ICD-10-CM | POA: Insufficient documentation

## 2016-06-17 DIAGNOSIS — I25110 Atherosclerotic heart disease of native coronary artery with unstable angina pectoris: Secondary | ICD-10-CM | POA: Insufficient documentation

## 2016-06-17 DIAGNOSIS — I252 Old myocardial infarction: Secondary | ICD-10-CM | POA: Insufficient documentation

## 2016-06-17 DIAGNOSIS — K219 Gastro-esophageal reflux disease without esophagitis: Secondary | ICD-10-CM | POA: Insufficient documentation

## 2016-06-17 DIAGNOSIS — R61 Generalized hyperhidrosis: Secondary | ICD-10-CM | POA: Insufficient documentation

## 2016-06-17 DIAGNOSIS — R51 Headache: Secondary | ICD-10-CM | POA: Diagnosis not present

## 2016-06-17 DIAGNOSIS — I10 Essential (primary) hypertension: Secondary | ICD-10-CM | POA: Diagnosis not present

## 2016-06-17 DIAGNOSIS — Z87891 Personal history of nicotine dependence: Secondary | ICD-10-CM | POA: Diagnosis not present

## 2016-06-17 DIAGNOSIS — E785 Hyperlipidemia, unspecified: Secondary | ICD-10-CM | POA: Diagnosis not present

## 2016-06-17 DIAGNOSIS — Z8249 Family history of ischemic heart disease and other diseases of the circulatory system: Secondary | ICD-10-CM | POA: Insufficient documentation

## 2016-06-17 DIAGNOSIS — R079 Chest pain, unspecified: Secondary | ICD-10-CM | POA: Diagnosis present

## 2016-06-17 DIAGNOSIS — M1990 Unspecified osteoarthritis, unspecified site: Secondary | ICD-10-CM | POA: Insufficient documentation

## 2016-06-17 DIAGNOSIS — R001 Bradycardia, unspecified: Secondary | ICD-10-CM | POA: Diagnosis not present

## 2016-06-17 DIAGNOSIS — I97630 Postprocedural hematoma of a circulatory system organ or structure following a cardiac catheterization: Secondary | ICD-10-CM | POA: Diagnosis not present

## 2016-06-17 DIAGNOSIS — I2510 Atherosclerotic heart disease of native coronary artery without angina pectoris: Secondary | ICD-10-CM

## 2016-06-17 DIAGNOSIS — Z96653 Presence of artificial knee joint, bilateral: Secondary | ICD-10-CM | POA: Insufficient documentation

## 2016-06-17 DIAGNOSIS — I208 Other forms of angina pectoris: Secondary | ICD-10-CM

## 2016-06-17 HISTORY — PX: CARDIAC CATHETERIZATION: SHX172

## 2016-06-17 LAB — BASIC METABOLIC PANEL
Anion gap: 7 (ref 5–15)
BUN: 16 mg/dL (ref 6–20)
CHLORIDE: 104 mmol/L (ref 101–111)
CO2: 26 mmol/L (ref 22–32)
Calcium: 9.4 mg/dL (ref 8.9–10.3)
Creatinine, Ser: 0.84 mg/dL (ref 0.61–1.24)
GFR calc non Af Amer: >60 mL/min (ref >60)
Glucose, Bld: 103 mg/dL — ABNORMAL HIGH (ref 65–99)
POTASSIUM: 4.4 mmol/L (ref 3.5–5.1)
SODIUM: 137 mmol/L (ref 135–145)

## 2016-06-17 LAB — CBC
HEMATOCRIT: 48.2 % (ref 39.0–52.0)
Hemoglobin: 16.5 g/dL (ref 13.0–17.0)
MCH: 31.7 pg (ref 26.0–34.0)
MCHC: 34.2 g/dL (ref 30.0–36.0)
MCV: 92.7 fL (ref 78.0–100.0)
PLATELETS: 226 10*3/uL (ref 150–400)
RBC: 5.2 MIL/uL (ref 4.22–5.81)
RDW: 13.8 % (ref 11.5–15.5)
WBC: 5.6 10*3/uL (ref 4.0–10.5)

## 2016-06-17 LAB — POCT ACTIVATED CLOTTING TIME: ACTIVATED CLOTTING TIME: 296 s

## 2016-06-17 LAB — PROTIME-INR
INR: 1.07 (ref 0.00–1.49)
PROTHROMBIN TIME: 14.1 s (ref 11.6–15.2)

## 2016-06-17 SURGERY — INTRAVASCULAR PRESSURE WIRE/FFR STUDY

## 2016-06-17 MED ORDER — VERAPAMIL HCL 2.5 MG/ML IV SOLN
INTRAVENOUS | Status: DC | PRN
  Administered 2016-06-17: 10 mL via INTRA_ARTERIAL

## 2016-06-17 MED ORDER — SODIUM CHLORIDE 0.9% FLUSH: 3.0000 mL | Freq: Two times a day (BID) | INTRAVENOUS | Status: DC

## 2016-06-17 MED ORDER — SODIUM CHLORIDE 0.9% FLUSH: 3.0000 mL | INTRAVENOUS | Status: DC | PRN

## 2016-06-17 MED ORDER — FENTANYL CITRATE (PF) 100 MCG/2ML IJ SOLN
INTRAMUSCULAR | Status: AC
  Filled 2016-06-17: qty 2

## 2016-06-17 MED ORDER — ONDANSETRON HCL 4 MG/2ML IJ SOLN: 4.0000 mg | Freq: Four times a day (QID) | INTRAMUSCULAR | Status: DC | PRN

## 2016-06-17 MED ORDER — ASPIRIN 81 MG PO CHEW
81.0000 mg | CHEWABLE_TABLET | ORAL | Status: AC
  Administered 2016-06-17: 81 mg via ORAL

## 2016-06-17 MED ORDER — NITROGLYCERIN 1 MG/10 ML FOR IR/CATH LAB
INTRA_ARTERIAL | Status: DC | PRN
  Administered 2016-06-17: 100 ug via INTRACORONARY

## 2016-06-17 MED ORDER — MIDAZOLAM HCL 2 MG/2ML IJ SOLN
INTRAMUSCULAR | Status: AC
  Filled 2016-06-17: qty 2

## 2016-06-17 MED ORDER — IOPAMIDOL (ISOVUE-370) INJECTION 76%
INTRAVENOUS | Status: DC | PRN
  Administered 2016-06-17: 30 mL via INTRA_ARTERIAL

## 2016-06-17 MED ORDER — FENTANYL CITRATE (PF) 100 MCG/2ML IJ SOLN
INTRAMUSCULAR | Status: DC | PRN
  Administered 2016-06-17: 25 ug via INTRAVENOUS

## 2016-06-17 MED ORDER — SODIUM CHLORIDE 0.9 % IV SOLN: 250.0000 mL | INTRAVENOUS | Status: DC | PRN

## 2016-06-17 MED ORDER — HEPARIN (PORCINE) IN NACL 2-0.9 UNIT/ML-% IJ SOLN
INTRAMUSCULAR | Status: DC | PRN
  Administered 2016-06-17: 1000 mL

## 2016-06-17 MED ORDER — LIDOCAINE HCL (PF) 1 % IJ SOLN
INTRAMUSCULAR | Status: DC | PRN
  Administered 2016-06-17: 3 mL

## 2016-06-17 MED ORDER — VERAPAMIL HCL 2.5 MG/ML IV SOLN
INTRAVENOUS | Status: AC
  Filled 2016-06-17: qty 2

## 2016-06-17 MED ORDER — ADENOSINE (DIAGNOSTIC) 140MCG/KG/MIN
INTRAVENOUS | Status: DC | PRN
  Administered 2016-06-17: 140 ug/kg/min via INTRAVENOUS

## 2016-06-17 MED ORDER — LIDOCAINE HCL (PF) 1 % IJ SOLN
INTRAMUSCULAR | Status: AC
  Filled 2016-06-17: qty 30

## 2016-06-17 MED ORDER — HEPARIN SODIUM (PORCINE) 1000 UNIT/ML IJ SOLN
INTRAMUSCULAR | Status: AC
  Filled 2016-06-17: qty 1

## 2016-06-17 MED ORDER — MIDAZOLAM HCL 2 MG/2ML IJ SOLN
INTRAMUSCULAR | Status: DC | PRN
  Administered 2016-06-17: 2 mg via INTRAVENOUS

## 2016-06-17 MED ORDER — SODIUM CHLORIDE 0.9 % WEIGHT BASED INFUSION: 3.0000 mL/kg/h | INTRAVENOUS | Status: AC

## 2016-06-17 MED ORDER — ADENOSINE 12 MG/4ML IV SOLN
16.0000 mL | Freq: Once | INTRAVENOUS | Status: DC
  Filled 2016-06-17: qty 16

## 2016-06-17 MED ORDER — HEPARIN SODIUM (PORCINE) 1000 UNIT/ML IJ SOLN
INTRAMUSCULAR | Status: DC | PRN
  Administered 2016-06-17: 7000 [IU] via INTRAVENOUS

## 2016-06-17 MED ORDER — HEPARIN (PORCINE) IN NACL 2-0.9 UNIT/ML-% IJ SOLN
INTRAMUSCULAR | Status: AC
  Filled 2016-06-17: qty 1000

## 2016-06-17 MED ORDER — NITROGLYCERIN 1 MG/10 ML FOR IR/CATH LAB
INTRA_ARTERIAL | Status: AC
  Filled 2016-06-17: qty 10

## 2016-06-17 MED ORDER — IOPAMIDOL (ISOVUE-370) INJECTION 76%
INTRAVENOUS | Status: AC
  Filled 2016-06-17: qty 100

## 2016-06-17 MED ORDER — SODIUM CHLORIDE 0.9 % IV SOLN
INTRAVENOUS | Status: DC
  Administered 2016-06-17: 999 mL/h via INTRAVENOUS
  Administered 2016-06-17: 11:00:00 via INTRAVENOUS

## 2016-06-17 MED ORDER — ASPIRIN 81 MG PO CHEW
CHEWABLE_TABLET | ORAL | Status: AC
  Filled 2016-06-17: qty 1

## 2016-06-17 SURGICAL SUPPLY — 11 items
CATH INFINITI JR4 5F (CATHETERS) ×2 IMPLANT
CATH VISTA GUIDE 6FR XBLAD3.5 (CATHETERS) ×2 IMPLANT
DEVICE RAD COMP TR BAND LRG (VASCULAR PRODUCTS) ×2 IMPLANT
GLIDESHEATH SLEND SS 6F .021 (SHEATH) ×2 IMPLANT
GUIDEWIRE PRESSURE COMET II (WIRE) ×2 IMPLANT
KIT ESSENTIALS PG (KITS) ×2 IMPLANT
KIT HEART LEFT (KITS) ×4 IMPLANT
PACK CARDIAC CATHETERIZATION (CUSTOM PROCEDURE TRAY) ×4 IMPLANT
TRANSDUCER W/STOPCOCK (MISCELLANEOUS) ×4 IMPLANT
TUBING CIL FLEX 10 FLL-RA (TUBING) ×4 IMPLANT
WIRE SAFE-T 1.5MM-J .035X260CM (WIRE) ×2 IMPLANT

## 2016-06-17 NOTE — Progress Notes (Signed)
Pt came over from cath lab with Carma LairKryestyn who was [facility].  Report received.  A few minutes later it was noted that hematoma was coming back.  Pressure held again. Hematoma resolved.

## 2016-06-17 NOTE — Discharge Instructions (Signed)
Radial Site Care °Refer to this sheet in the next few weeks. These instructions provide you with information about caring for yourself after your procedure. Your health care provider may also give you more specific instructions. Your treatment has been planned according to current medical practices, but problems sometimes occur. Call your health care provider if you have any problems or questions after your procedure. °WHAT TO EXPECT AFTER THE PROCEDURE °After your procedure, it is typical to have the following: °· Bruising at the radial site that usually fades within 1-2 weeks. °· Blood collecting in the tissue (hematoma) that may be painful to the touch. It should usually decrease in size and tenderness within 1-2 weeks. °HOME CARE INSTRUCTIONS °· Take medicines only as directed by your health care provider. °· You may shower 24-48 hours after the procedure or as directed by your health care provider. Remove the bandage (dressing) and gently wash the site with plain soap and water. Pat the area dry with a clean towel. Do not rub the site, because this may cause bleeding. °· Do not take baths, swim, or use a hot tub until your health care provider approves. °· Check your insertion site every day for redness, swelling, or drainage. °· Do not apply powder or lotion to the site. °· Do not flex or bend the affected arm for 24 hours or as directed by your health care provider. °· Do not push or pull heavy objects with the affected arm for 24 hours or as directed by your health care provider. °· Do not lift over 10 lb (4.5 kg) for 5 days after your procedure or as directed by your health care provider. °· Ask your health care provider when it is okay to: °¨ Return to work or school. °¨ Resume usual physical activities or sports. °¨ Resume sexual activity. °· Do not drive home if you are discharged the same day as the procedure. Have someone else drive you. °· You may drive 24 hours after the procedure unless otherwise  instructed by your health care provider. °· Do not operate machinery or power tools for 24 hours after the procedure. °· If your procedure was done as an outpatient procedure, which means that you went home the same day as your procedure, a responsible adult should be with you for the first 24 hours after you arrive home. °· Keep all follow-up visits as directed by your health care provider. This is important. °SEEK MEDICAL CARE IF: °· You have a fever. °· You have chills. °· You have increased bleeding from the radial site. Hold pressure on the site. °SEEK IMMEDIATE MEDICAL CARE IF: °· You have unusual pain at the radial site. °· You have redness, warmth, or swelling at the radial site. °· You have drainage (other than a small amount of blood on the dressing) from the radial site. °· The radial site is bleeding, and the bleeding does not stop after 30 minutes of holding steady pressure on the site. °· Your arm or hand becomes pale, cool, tingly, or numb. °  °This information is not intended to replace advice given to you by your health care provider. Make sure you discuss any questions you have with your health care provider. °  °Document Released: 01/02/2011 Document Revised: 12/21/2014 Document Reviewed: 06/18/2014 °Elsevier Interactive Patient Education ©2016 [employer] ° °

## 2016-06-17 NOTE — Progress Notes (Signed)
Hematoma returned. Pressure held again x 10 min resolved

## 2016-06-17 NOTE — Interval H&P Note (Signed)
Cath Lab Visit (complete for each Cath Lab visit)  Clinical Evaluation Leading to the Procedure:   ACS: No.  Non-ACS:    Anginal Classification: CCS III  Anti-ischemic medical therapy: Maximal Therapy (2 or more classes of medications)  Non-Invasive Test Results: No non-invasive testing performed  Prior CABG: No previous CABG      History and Physical Interval Note:  06/17/2016 12:40 PM  Louis Perez  has presented today for surgery, with the diagnosis of cp   The various methods of treatment have been discussed with the patient and family. After consideration of risks, benefits and other options for treatment, the patient has consented to  Procedure(s): Left Heart Cath and Coronary Angiography (N/A) Intravascular Pressure Wire/FFR Study (N/A) as a surgical intervention .  The patient's history has been reviewed, patient examined, no change in status, stable for surgery.  I have reviewed the patient's chart and labs.  Questions were answered to the patient's satisfaction.     Tonny Bollmanooper, Jeremiah Curci

## 2016-06-17 NOTE — Progress Notes (Signed)
Hematoma returned Louis Perez/pressure held. Cath lab notified. Louis HesselbachMaria and Louis Perez came to access site and reposition band.

## 2016-06-17 NOTE — Progress Notes (Signed)
[facility] Pressure held x 10min with relief of hematoma. Site soft.

## 2016-06-17 NOTE — H&P (View-Only) (Signed)
Cardiology Office Note   Date:  06/15/2016   ID:  STUART MIRABILE, DOB [birth year], MRN [id number]  PCP:  Delorse Lek, MD  Cardiologist:   Chrystie Nose, MD   Chief Complaint  Patient presents with  . Follow-up    chest pain; stabbing pain. dizziness; due to medication.       History of Present Illness: Louis Perez is a 65 y.o. male who presents for evaluation of chest and back pain. Patient has a history of coronary disease with recent NSTEMI in March with DES stenting of the LAD.  He had non obstructive disease elsewhere.  EF was normal.    He was added to my schedule today for evaluation of chest and arm discomfort. This began on Sunday. It is similar to his previous unstable angina but not as severe. Still he describes the peak of his discomfort at 7 out of 10 in intensity. He gets across his chest. His left arm hurt. At times he has noticed some diaphoresis. It might last for only a minute. He has taken nitroglycerin and over the course of the last few days and it improves the symptoms. He describes it as somewhat heavy. He's not had any palpitations, presyncope or syncope. He's not describing any new shortness of breath, PND or orthopnea. He has brought the discomfort on a few times when he's been doing activities although this is been intermittent. He was actually able to do a little golfing yesterday without symptoms. However, prior to this he had discomfort with golfing building his children sandbox. This morning the pain was at rest. He still describes a little mid chest tightness.  Of note he has not had the symptoms since his stent.  I did review his catheterization. I looked at the images and he had a very high grade proximal LAD in March with nonobstructive disease in the right coronary.  05/28/2016  Louis Perez is a 65 year old patient who I last saw in 2014. He did not see me in follow-up since that time and is seen a number of other providers in our  practice, ultimately leading to presentation with an STEMI in March with a placement of a drug-eluting stent in the proximal LAD. Subsequent to that he had chest pain as described above and was referred back to the hospital for coronary evaluation. Catheterization indicated an 80% ostial ramus lesion at the area of partially jailed vessel due to the proximal LAD stent. It was felt that intervention could compromise the LAD therefore medical therapy was recommended. His isosorbide was doubled to 60 mg daily. Since then he reports he has had some improvement in chest discomfort but has significant headache.  06/15/2016  Louis Perez was seen back today in the office for chest pain. When I last saw him we had increased his isosorbide up to 60 mg daily and he's been on Ranexa thousand milligrams twice a day. He reports he continues to have episodes of chest discomfort which can occur at rest or with exertion. I've advised that he stop cardiac rehabilitation until we can determine whether or not his pain is related to coronary obstruction.  Past Medical History  Diagnosis Date  . Hypertension   . Hyperlipidemia   . Gilbert's disease   . Bacterial septicemia (HCC) 01/2011    "both knee involved"  . 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 Unspecified tinnitus     right  . Coronary artery disease   .  NSTEMI (non-ST elevated myocardial infarction) (HCC) 02/2016    [name]/notes 03/02/2016  . Asthma dx'd in 2014  . GERD (gastroesophageal reflux disease)   . Arthritis     "hips, lower back, knees, shoulders, hands" (05/14/2016)  . CAD (coronary artery disease)     a. NSTEMI 02/2016 s/p DES to LAD. b. Relook cath due to angina/prior high risk anatomy 05/14/16 -> continued medical therapy given concern that a stent strut could inferere with Cx itself.  . Sinus bradycardia     Past Surgical History  Procedure Laterality Date  . Lumbar disc surgery  ~ 1980; 2000  . Back surgery    . Nasal sinus surgery  08/2011  .  Total knee arthroplasty Bilateral 03/26/2004-09/15/2004     right-left  . Cardiovascular stress test  05/26/2012    EKG negative for ischemia, no ECG changes, no significant ischemia noted  . Transthoracic echocardiogram  05/26/2012    EF >55%, mild concentric LVH  . Cardiac catheterization N/A 03/01/2016    Procedure: Left Heart Cath and Coronary Angiography;  Surgeon: Tonny BollmanMichael Cooper, MD;  oLAD 95%, CFX/RI/RCA/RPDA 25-30%  . Cardiac catheterization N/A 03/01/2016    Procedure: Coronary Stent Intervention;  Surgeon: Tonny BollmanMichael Cooper, MD;  Promus Premier 4.0 x  12 mm DES to oLAD  . I&d knee with poly exchange Bilateral 01/2011    & complete synovectomy of 3 compartments of the right total knee/notes 02/04/2011 (replacement,partial knee replacement because of bacterial infection)  . Joint replacement    . Knee arthroscopy Right ~ 2002  . Shoulder arthroscopy w/ rotator cuff repair Right 01/2015  . Cardiac catheterization N/A 05/14/2016    Procedure: Left Heart Cath and Coronary Angiography;  Surgeon: Lennette Biharihomas A Kelly, MD;  [facility];  Service: Cardiovascular;  Laterality: N/A;     Current Outpatient Prescriptions  Medication Sig Dispense Refill  . acetaminophen (TYLENOL) 500 MG tablet Take 500-1,000 mg by mouth 2 (two) times daily as needed.    Marland Kitchen. albuterol (PROAIR HFA) 108 (90 BASE) MCG/ACT inhaler 2 puffs every 4 hours as needed only  if your can't catch your breath 1 Inhaler 1  . aspirin 81 MG tablet Take 81 mg by mouth at bedtime.     Marland Kitchen. atorvastatin (LIPITOR) 80 MG tablet Take 1 tablet (80 mg total) by mouth daily at 6 PM. 30 tablet 30  . cetirizine (ZYRTEC) 10 MG tablet Take 10 mg by mouth daily.    . fluticasone (FLONASE) 50 MCG/ACT nasal spray Place 2 sprays into both nostrils daily.    . Garlic 1000 MG CAPS Take by mouth daily.    . isosorbide mononitrate (IMDUR) 30 MG 24 hr tablet Take 1 tablet (30 mg total) by mouth 2 (two) times daily. 180 tablet 3  . losartan (COZAAR) 50 MG  tablet Take 0.5 tablets (25 mg total) by mouth daily.    . mometasone-formoterol (DULERA) 200-5 MCG/ACT AERO Take 2 puffs first thing in am and then another 2 puffs about 12 hours later. 1 Inhaler 11  . Multiple Vitamin (MULTIVITAMIN WITH MINERALS) TABS tablet Take 1 tablet by mouth daily.    . nitroGLYCERIN (NITROSTAT) 0.4 MG SL tablet Place 1 tablet (0.4 mg total) under the tongue every 5 (five) minutes x 3 doses as needed for chest pain. 25 tablet 12  . omeprazole (PRILOSEC) 40 MG capsule Take 1 capsule (40 mg total) by mouth 2 (two) times daily. 60 capsule 2  . ranolazine (RANEXA) 1000 MG SR tablet  Take 1 tablet (1,000 mg total) by mouth 2 (two) times daily. 60 tablet 5  . ticagrelor (BRILINTA) 90 MG TABS tablet Take 1 tablet (90 mg total) by mouth 2 (two) times daily. 60 tablet 11   No current facility-administered medications for this visit.    Allergies:   Oxycontin; Percocet; and Ambien    Social History:  The patient  reports that he quit smoking about 47 years ago. His smoking use included Cigarettes. He has a 5 pack-year smoking history. He has never used smokeless tobacco. He reports that he drinks about 0.6 oz of alcohol per week. He reports that he does not use illicit drugs.   Family History:  The patient's family history includes Alzheimer's disease in his father; Dementia in his father; Heart disease in his mother; Heart disease (age of onset: 5460) in his brother.    ROS:  Please see the history of present illness.   Otherwise, review of systems are positive for none.   All other systems are reviewed and negative.    PHYSICAL EXAM: VS:  BP 154/90 mmHg  Pulse 61  Ht 6\' 3"  (1.905 m)  Wt 204 lb (92.534 kg)  BMI 25.50 kg/m2 , BMI Body mass index is 25.5 kg/(m^2). Deferred   EKG:   Normal sinus rhythm at 61, possible inferoposterior infarct pattern  Recent Labs: 03/01/2016: B Natriuretic Peptide 51.0; Magnesium 1.9 05/14/2016: ALT 32 05/15/2016: BUN 15; Creatinine, Ser  0.65; Hemoglobin 14.4; Platelets 193; Potassium 3.3*; Sodium 140    Lipid Panel    Component Value Date/Time   CHOL 165 03/01/2016 0448   TRIG 85 03/01/2016 0448   HDL 55 03/01/2016 0448   CHOLHDL 3.0 03/01/2016 0448   VLDL 17 03/01/2016 0448   LDLCALC 93 03/01/2016 0448      Wt Readings from Last 3 Encounters:  06/15/16 204 lb (92.534 kg)  05/28/16 204 lb 3.2 oz (92.625 kg)  05/15/16 206 lb (93.441 kg)      Other studies Reviewed: Additional studies/ records that were reviewed today include: Cath films. Review of the above records demonstrates:  Please see elsewhere in the note.     ASSESSMENT AND PLAN:  Patient Active Problem List   Diagnosis Date Noted  . Chest pain, unspecified 05/28/2016  . Sinus bradycardia 05/15/2016  . Unstable angina (HCC) 05/14/2016  . CAD (coronary artery disease), native coronary artery 05/14/2016  . Dyslipidemia 09/28/2013  . GERD (gastroesophageal reflux disease) 09/28/2013  . 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 Moderate persistent chronic asthma with acute exacerbation 06/05/2013  . Hypertension 06/05/2013   PLAN: Louis Perez continues to have chest pain symptoms both at rest and with exertion. This is despite maximal medical therapy with antianginal medicine. He is known to have an 80% ostial ramus stenosis with prior stent placed in the LAD. That stent seems to jail the ostium of the ramus vessel. This could make intervention difficult. I discussed the case with Dr. Excell Seltzerooper who placed his original stent and he felt that we could investigate the ramus vessel with FFR and if it was treated significance attempt a PCI based on his ongoing symptoms. He is available to do this on Wednesday this week. We will arrange for cardiac catheterization. I will increase his isosorbide to 60 mg every morning and 30 mg every afternoon.  Follow-up with me after his cath.  Chrystie NoseKenneth C. [name], MD, [facility] CenterFACC Attending Cardiologist [facility]  Chrystie NoseKenneth C [name], MD    06/15/2016 3:40 PM

## 2016-06-18 ENCOUNTER — Encounter (HOSPITAL_COMMUNITY): Payer: Self-pay | Admitting: Cardiovascular Disease

## 2016-06-19 ENCOUNTER — Encounter (HOSPITAL_COMMUNITY): Payer: Medicare Other

## 2016-06-22 ENCOUNTER — Encounter (HOSPITAL_COMMUNITY)
Admission: RE | Admit: 2016-06-22 | Discharge: 2016-06-22 | Disposition: A | Discharge status: Home / Self Care | Payer: Medicare Other | Source: Ambulatory Visit | Attending: Internal Medicine | Admitting: Internal Medicine

## 2016-06-22 DIAGNOSIS — K219 Gastro-esophageal reflux disease without esophagitis: Secondary | ICD-10-CM | POA: Diagnosis not present

## 2016-06-22 DIAGNOSIS — I214 Non-ST elevation (NSTEMI) myocardial infarction: Secondary | ICD-10-CM | POA: Diagnosis not present

## 2016-06-22 DIAGNOSIS — Z955 Presence of coronary angioplasty implant and graft: Secondary | ICD-10-CM

## 2016-06-22 NOTE — Progress Notes (Signed)
Daily Session Note  Patient Details  Name: Louis Perez MRN: [id number] Date of Birth: [birth year] Referring Provider:        CARDIAC REHAB PHASE II ORIENTATION from 04/07/2016 in [place]   Referring Provider  Dr. Debara Pickett      Encounter Date: 06/22/2016  Check In:     Session Check In - 06/22/16 0815    Check-In   Location [facility]   Staff Present Diane Angelina Pih, MS, EP, [facility], Exercise Physiologist;Marco Raper Luther Parody, BS, EP, Exercise Physiologist;Debra Wynetta Emery, RN, BSN   Supervising physician immediately available to respond to emergencies See telemetry face sheet for immediately available MD   Medication changes reported     No   Fall or balance concerns reported    No   Warm-up and Cool-down Performed as group-led instruction   Resistance Training Performed Yes   VAD Patient? No   Pain Assessment   Currently in Pain? No/denies   Pain Score 0-No pain   Multiple Pain Sites No      Capillary Blood Glucose: No results found for this or any previous visit (from the past 24 hour(s)).   Goals Met:  Independence with exercise equipment Exercise tolerated well No report of cardiac concerns or symptoms Strength training completed today  Goals Unmet:  Not Applicable  Comments: Check out 915   Dr. Kate Sable is Medical Director for [place] and Pulmonary Rehab.

## 2016-06-24 ENCOUNTER — Encounter (HOSPITAL_COMMUNITY)
Admission: RE | Admit: 2016-06-24 | Discharge: 2016-06-24 | Disposition: A | Discharge status: Home / Self Care | Payer: Medicare Other | Source: Ambulatory Visit | Attending: Internal Medicine | Admitting: Internal Medicine

## 2016-06-24 DIAGNOSIS — I214 Non-ST elevation (NSTEMI) myocardial infarction: Secondary | ICD-10-CM | POA: Diagnosis not present

## 2016-06-24 DIAGNOSIS — Z955 Presence of coronary angioplasty implant and graft: Secondary | ICD-10-CM

## 2016-06-24 NOTE — Progress Notes (Signed)
Daily Session Note  Patient Details  Name: Louis Perez MRN: [id number] Date of Birth: 11-02-1951 Referring Provider:        CARDIAC REHAB PHASE II ORIENTATION from 04/07/2016 in [place]   Referring Provider  Dr. Debara Pickett      Encounter Date: 06/24/2016  Check In:     Session Check In - 06/24/16 0810    Check-In   Location [facility]   Staff Present Suzanne Boron, BS, EP, Exercise Physiologist;Rashed Edler Wynetta Emery, RN, BSN   Supervising physician immediately available to respond to emergencies See telemetry face sheet for immediately available MD   Medication changes reported     No   Fall or balance concerns reported    No   Warm-up and Cool-down Performed as group-led instruction   Resistance Training Performed Yes   VAD Patient? No   Pain Assessment   Currently in Pain? No/denies   Pain Score 0-No pain   Multiple Pain Sites No      Capillary Blood Glucose: No results found for this or any previous visit (from the past 24 hour(s)).   Goals Met:  Independence with exercise equipment Exercise tolerated well No report of cardiac concerns or symptoms Strength training completed today  Goals Unmet:  Not Applicable  Comments: Check out 915.   Dr. Kate Sable is Medical Director for [facility] Cardiac and Pulmonary Rehab.

## 2016-06-26 ENCOUNTER — Encounter (HOSPITAL_COMMUNITY)
Admission: RE | Admit: 2016-06-26 | Discharge: 2016-06-26 | Disposition: A | Discharge status: Home / Self Care | Payer: Medicare Other | Source: Ambulatory Visit | Attending: Internal Medicine | Admitting: Internal Medicine

## 2016-06-26 DIAGNOSIS — I214 Non-ST elevation (NSTEMI) myocardial infarction: Secondary | ICD-10-CM

## 2016-06-26 DIAGNOSIS — Z955 Presence of coronary angioplasty implant and graft: Secondary | ICD-10-CM

## 2016-06-26 NOTE — Progress Notes (Signed)
Daily Session Note  Patient Details  Name: Louis Perez MRN: [id number] Date of Birth: 17-Aug-1951 Referring Provider:        CARDIAC REHAB PHASE II ORIENTATION from 04/07/2016 in [place]   Referring Provider  Dr. Debara Pickett      Encounter Date: 06/26/2016  Check In:     Session Check In - 06/26/16 0815    Check-In   Location [facility]   Staff Present Diane Angelina Pih, MS, EP, [facility], Exercise Physiologist;Aleza Pew Luther Parody, BS, EP, Exercise Physiologist;Debra Wynetta Emery, RN, BSN   Supervising physician immediately available to respond to emergencies See telemetry face sheet for immediately available MD   Medication changes reported     No   Fall or balance concerns reported    No   Warm-up and Cool-down Performed as group-led instruction   Resistance Training Performed Yes   VAD Patient? No   Pain Assessment   Currently in Pain? No/denies   Pain Score 0-No pain   Multiple Pain Sites No      Capillary Blood Glucose: No results found for this or any previous visit (from the past 24 hour(s)).   Goals Met:  Independence with exercise equipment Exercise tolerated well No report of cardiac concerns or symptoms Strength training completed today  Goals Unmet:  Not Applicable  Comments: Check out 915   Dr. Kate Sable is Medical Director for [place] and Pulmonary Rehab.

## 2016-06-29 ENCOUNTER — Encounter (HOSPITAL_COMMUNITY)
Admission: RE | Admit: 2016-06-29 | Discharge: 2016-06-29 | Disposition: A | Discharge status: Home / Self Care | Payer: Medicare Other | Source: Ambulatory Visit | Attending: Internal Medicine | Admitting: Internal Medicine

## 2016-06-29 DIAGNOSIS — Z955 Presence of coronary angioplasty implant and graft: Secondary | ICD-10-CM

## 2016-06-29 DIAGNOSIS — I214 Non-ST elevation (NSTEMI) myocardial infarction: Secondary | ICD-10-CM | POA: Diagnosis not present

## 2016-06-29 NOTE — Progress Notes (Signed)
Daily Session Note  Patient Details  Name: Louis Perez MRN: [id number] Date of Birth: Nov 01, 1951 Referring Provider:        CARDIAC REHAB PHASE II ORIENTATION from 04/07/2016 in [place]   Referring Provider  Dr. Debara Pickett      Encounter Date: 06/29/2016  Check In:     Session Check In - 06/29/16 0815    Check-In   Location [facility]   Staff Present Mikaila Grunert Angelina Pih, MS, EP, [facility], Exercise Physiologist;Gregory Luther Parody, BS, EP, Exercise Physiologist;Debra Wynetta Emery, RN, BSN   Supervising physician immediately available to respond to emergencies See telemetry face sheet for immediately available MD   Medication changes reported     --   Fall or balance concerns reported    No   Warm-up and Cool-down Performed as group-led instruction   Resistance Training Performed Yes   VAD Patient? No   Pain Assessment   Currently in Pain? --   Pain Score 0-No pain   Multiple Pain Sites --      Capillary Blood Glucose: No results found for this or any previous visit (from the past 24 hour(s)).   Goals Met:  Independence with exercise equipment Exercise tolerated well No report of cardiac concerns or symptoms Strength training completed today  Goals Unmet:  Not Applicable  Comments: Check out 915   Dr. Kate Sable is Medical Director for [place] and Pulmonary Rehab.

## 2016-06-30 DIAGNOSIS — Z96653 Presence of artificial knee joint, bilateral: Secondary | ICD-10-CM | POA: Diagnosis not present

## 2016-07-01 ENCOUNTER — Encounter (HOSPITAL_COMMUNITY)
Admission: RE | Admit: 2016-07-01 | Discharge: 2016-07-01 | Disposition: A | Discharge status: Home / Self Care | Payer: Medicare Other | Source: Ambulatory Visit | Attending: Internal Medicine | Admitting: Internal Medicine

## 2016-07-01 DIAGNOSIS — I214 Non-ST elevation (NSTEMI) myocardial infarction: Secondary | ICD-10-CM

## 2016-07-01 DIAGNOSIS — Z955 Presence of coronary angioplasty implant and graft: Secondary | ICD-10-CM

## 2016-07-01 NOTE — Progress Notes (Signed)
Daily Session Note  Patient Details  Name: Louis Perez MRN: [id number] Date of Birth: 1951/11/05 Referring Provider:        CARDIAC REHAB PHASE II ORIENTATION from 04/07/2016 in [place]   Referring Provider  Dr. Debara Pickett      Encounter Date: 07/01/2016  Check In:     Session Check In - 07/01/16 0815    Check-In   Location [facility]   Staff Present Diane Angelina Pih, MS, EP, [facility], Exercise Physiologist;Johnthomas Lader Luther Parody, BS, EP, Exercise Physiologist;Debra Wynetta Emery, RN, BSN   Supervising physician immediately available to respond to emergencies See telemetry face sheet for immediately available MD   Medication changes reported     No   Fall or balance concerns reported    No   Warm-up and Cool-down Performed as group-led instruction   Resistance Training Performed Yes   VAD Patient? No   Pain Assessment   Currently in Pain? No/denies   Pain Score 0-No pain   Multiple Pain Sites No      Capillary Blood Glucose: No results found for this or any previous visit (from the past 24 hour(s)).   Goals Met:  Independence with exercise equipment Exercise tolerated well No report of cardiac concerns or symptoms Strength training completed today  Goals Unmet:  Not Applicable  Comments: Check out 915   Dr. Kate Sable is Medical Director for [place] and Pulmonary Rehab.

## 2016-07-02 ENCOUNTER — Ambulatory Visit: Payer: PRIVATE HEALTH INSURANCE | Admitting: Internal Medicine

## 2016-07-03 ENCOUNTER — Encounter (HOSPITAL_COMMUNITY)
Admission: RE | Admit: 2016-07-03 | Discharge: 2016-07-03 | Disposition: A | Discharge status: Home / Self Care | Payer: Medicare Other | Source: Ambulatory Visit | Attending: Internal Medicine | Admitting: Internal Medicine

## 2016-07-03 DIAGNOSIS — I214 Non-ST elevation (NSTEMI) myocardial infarction: Secondary | ICD-10-CM | POA: Diagnosis not present

## 2016-07-03 NOTE — Progress Notes (Signed)
Daily Session Note  Patient Details  Name: Louis Perez MRN: [id number] Date of Birth: April 01, 1951 Referring Provider:        CARDIAC REHAB PHASE II ORIENTATION from 04/07/2016 in [place]   Referring Provider  Dr. Debara Pickett      Encounter Date: 07/03/2016  Check In:     Session Check In - 07/03/16 0815    Check-In   Location [facility]   Staff Present Diane Angelina Pih, MS, EP, [facility], Exercise Physiologist;Gregory Luther Parody, BS, EP, Exercise Physiologist;Estuardo Frisbee Wynetta Emery, RN, BSN   Supervising physician immediately available to respond to emergencies See telemetry face sheet for immediately available MD   Medication changes reported     No   Fall or balance concerns reported    No   Warm-up and Cool-down Performed as group-led instruction   Resistance Training Performed Yes   VAD Patient? No   Pain Assessment   Currently in Pain? No/denies   Pain Score 0-No pain   Multiple Pain Sites No      Capillary Blood Glucose: No results found for this or any previous visit (from the past 24 hour(s)).   Goals Met:  Independence with exercise equipment Exercise tolerated well No report of cardiac concerns or symptoms Strength training completed today  Goals Unmet:  Not Applicable  Comments: Check out 915.   Dr. Kate Sable is Medical Director for [facility] Cardiac and Pulmonary Rehab.

## 2016-07-06 ENCOUNTER — Encounter (HOSPITAL_COMMUNITY)
Admission: RE | Admit: 2016-07-06 | Discharge: 2016-07-06 | Disposition: A | Discharge status: Home / Self Care | Payer: Medicare Other | Source: Ambulatory Visit | Attending: Internal Medicine | Admitting: Internal Medicine

## 2016-07-06 DIAGNOSIS — Z0000 Encounter for general adult medical examination without abnormal findings: Secondary | ICD-10-CM | POA: Diagnosis not present

## 2016-07-06 DIAGNOSIS — I214 Non-ST elevation (NSTEMI) myocardial infarction: Secondary | ICD-10-CM | POA: Diagnosis not present

## 2016-07-06 DIAGNOSIS — Z955 Presence of coronary angioplasty implant and graft: Secondary | ICD-10-CM

## 2016-07-06 NOTE — Progress Notes (Signed)
Daily Session Note  Patient Details  Name: Louis Perez MRN: [id number] Date of Birth: 1950-12-19 Referring Provider:   Flowsheet Row CARDIAC REHAB PHASE II ORIENTATION from 04/07/2016 in [place]  Referring Provider  Dr. Debara Pickett      Encounter Date: 07/06/2016  Check In:     Session Check In - 07/06/16 0815      Check-In   Location [facility]   Staff Present Diane Angelina Pih, MS, EP, [facility], Exercise Physiologist;Gregory Luther Parody, BS, EP, Exercise Physiologist;Chaundra Abreu Wynetta Emery, RN, BSN   Supervising physician immediately available to respond to emergencies See telemetry face sheet for immediately available MD   Medication changes reported     No   Fall or balance concerns reported    No   Warm-up and Cool-down Performed as group-led instruction   Resistance Training Performed Yes   VAD Patient? No     Pain Assessment   Currently in Pain? No/denies   Pain Score 0-No pain   Multiple Pain Sites No      Capillary Blood Glucose: No results found for this or any previous visit (from the past 24 hour(s)).   Goals Met:  Independence with exercise equipment Exercise tolerated well No report of cardiac concerns or symptoms Strength training completed today  Goals Unmet:  Not Applicable  Comments: Check out 915.   Dr. Kate Sable is Medical Director for [facility] Cardiac and Pulmonary Rehab.

## 2016-07-07 ENCOUNTER — Ambulatory Visit (INDEPENDENT_AMBULATORY_CARE_PROVIDER_SITE_OTHER): Payer: Medicare Other | Admitting: Pulmonary Disease

## 2016-07-07 ENCOUNTER — Encounter: Payer: Self-pay | Admitting: Pulmonary Disease

## 2016-07-07 DIAGNOSIS — J4541 Moderate persistent asthma with (acute) exacerbation: Secondary | ICD-10-CM | POA: Diagnosis not present

## 2016-07-07 DIAGNOSIS — I200 Unstable angina: Secondary | ICD-10-CM | POA: Diagnosis not present

## 2016-07-07 MED ORDER — MOMETASONE FURO-FORMOTEROL FUM 200-5 MCG/ACT IN AERO: INHALATION_SPRAY | RESPIRATORY_TRACT | 11 refills | Status: DC

## 2016-07-07 MED ORDER — METHYLPREDNISOLONE ACETATE 80 MG/ML IJ SUSP
80.0000 mg | Freq: Once | INTRAMUSCULAR | Status: AC
  Administered 2016-07-07: 80 mg via INTRAMUSCULAR

## 2016-07-07 MED ORDER — MOMETASONE FURO-FORMOTEROL FUM 200-5 MCG/ACT IN AERO: 2.0000 | INHALATION_SPRAY | Freq: Two times a day (BID) | RESPIRATORY_TRACT | 11 refills | Status: DC

## 2016-07-07 MED ORDER — PREDNISONE 10 MG (21) PO TBPK: 10.0000 mg | ORAL_TABLET | Freq: Every day | ORAL | 0 refills | Status: DC

## 2016-07-07 NOTE — Patient Instructions (Signed)
Solu-Medrol 80 mg IM now Prednisone dosepak - 60 mg - take as directed Call if no better by Friday  Prescription for Dulera will be sent to [facility] Sample 1 

## 2016-07-07 NOTE — Assessment & Plan Note (Signed)
Continue omeprazole 

## 2016-07-07 NOTE — Progress Notes (Signed)
   Subjective:    Patient ID: Louis Perez, male    DOB: [birth year], 65 y.o.   MRN: [id number]  HPI 65 year old nonsmoker with moderate persistent asthma  Chief Complaint  Patient presents with  . Acute Visit    Dr. Sherene Sires patient; breathing has been bad, especially at night.  chest tightness, wheezing. Pts insurance increased copay on Dulera, cannot afford it. Hx of Heart attack in March, 3 caths since March.     Last seen 08/2015 He had an LAD stent placed, was having persistent chest pain and underwent another cardiac cath-then placed on nitrates with improvement. He was also started on Ranexa a month ago For the last week he does reports increased wheezing and hoarseness, similar symptoms last year which resolved with steroids-no clear trigger detected He denies URI symptoms or exposure to chemicals or odors  He also reports change in insurance, reports compliance with Dulera, but unable to fill in prescription due to high cost due to nonpreferred pharmacy Heartburn is controlled on omeprazole   RAST neg 2014   Review of Systems neg for any significant sore throat, dysphagia, itching, sneezing, nasal congestion or excess/ purulent secretions, fever, chills, sweats, unintended wt loss, pleuritic or exertional cp, hempoptysis, orthopnea pnd or change in chronic leg swelling. Also denies presyncope, palpitations, heartburn, abdominal pain, nausea, vomiting, diarrhea or change in bowel or urinary habits, dysuria,hematuria, rash, arthralgias, visual complaints, headache, numbness weakness or ataxia.     Objective:   Physical Exam   Gen. Pleasant, well-nourished, in no distress ENT - no lesions, no post nasal drip Neck: No JVD, no thyromegaly, no carotid bruits Lungs: no use of accessory muscles, no dullness to percussion, no rales, BL scattered rhonchi  Cardiovascular: Rhythm regular, heart sounds  normal, no murmurs or gallops, no peripheral edema Musculoskeletal: No  deformities, no cyanosis or clubbing         Assessment & Plan:

## 2016-07-07 NOTE — Assessment & Plan Note (Signed)
Solu-Medrol 80 mg IM now Prednisone dosepak - 60 mg - take as directed Call if no better by Friday  Prescription for [facility] will be sent to [facility] Sample [facility]

## 2016-07-08 ENCOUNTER — Encounter (HOSPITAL_COMMUNITY)
Admission: RE | Admit: 2016-07-08 | Discharge: 2016-07-08 | Disposition: A | Discharge status: Home / Self Care | Payer: Medicare Other | Source: Ambulatory Visit | Attending: Internal Medicine | Admitting: Internal Medicine

## 2016-07-08 ENCOUNTER — Ambulatory Visit: Payer: Medicare Other | Admitting: Internal Medicine

## 2016-07-08 DIAGNOSIS — I214 Non-ST elevation (NSTEMI) myocardial infarction: Secondary | ICD-10-CM

## 2016-07-08 DIAGNOSIS — Z955 Presence of coronary angioplasty implant and graft: Secondary | ICD-10-CM

## 2016-07-08 NOTE — Progress Notes (Signed)
Daily Session Note  Patient Details  Name: Louis Perez MRN: [id number] Date of Birth: 1951-04-15 Referring Provider:   Flowsheet Row CARDIAC REHAB PHASE II ORIENTATION from 04/07/2016 in [place]  Referring Provider  Dr. Debara Pickett      Encounter Date: 07/08/2016  Check In:     Session Check In - 07/08/16 0815      Check-In   Location [facility]   Staff Present Diane Angelina Pih, MS, EP, [facility], Exercise Physiologist;Cortez Flippen Luther Parody, BS, EP, Exercise Physiologist;Debra Wynetta Emery, RN, BSN   Supervising physician immediately available to respond to emergencies See telemetry face sheet for immediately available MD   Medication changes reported     No   Fall or balance concerns reported    No   Warm-up and Cool-down Performed as group-led instruction   Resistance Training Performed Yes   VAD Patient? No     Pain Assessment   Currently in Pain? No/denies   Pain Score 0-No pain   Multiple Pain Sites No      Capillary Blood Glucose: No results found for this or any previous visit (from the past 24 hour(s)).   Goals Met:  Independence with exercise equipment Exercise tolerated well No report of cardiac concerns or symptoms Strength training completed today  Goals Unmet:  Not Applicable  Comments: Check out 915   Dr. Kate Sable is Medical Director for [place] and Pulmonary Rehab.

## 2016-07-08 NOTE — Progress Notes (Signed)
Cardiac Individual Treatment Plan  Patient Details  Name: REZA CRYMES MRN: [id number] Date of Birth: [birth year] Referring Provider:   Flowsheet Row CARDIAC REHAB PHASE II ORIENTATION from 04/07/2016 in [facility] CARDIAC REHABILITATION  Referring Provider  Dr. Rennis Golden      Initial Encounter Date:  Flowsheet Row CARDIAC REHAB PHASE II ORIENTATION from 04/07/2016 in [place] CARDIAC REHABILITATION  Date  04/07/16  Referring Provider  Dr. Rennis Golden      Visit Diagnosis: Stented coronary artery  NSTEMI (non-ST elevation myocardial infarction) [facility])  Patient's Home Medications on Admission:  Current Outpatient Prescriptions:  .  acetaminophen (TYLENOL) 500 MG tablet, Take 500-1,000 mg by mouth 2 (two) times daily as needed for headache. , Disp: , Rfl:  .  albuterol (PROAIR HFA) 108 (90 BASE) MCG/ACT inhaler, 2 puffs every 4 hours as needed only  if your can't catch your breath (Patient taking differently: Inhale 2 puffs into the lungs every 4 (four) hours as needed for shortness of breath. ), Disp: 1 Inhaler, Rfl: 1 .  aspirin 81 MG tablet, Take 81 mg by mouth at bedtime. , Disp: , Rfl:  .  atorvastatin (LIPITOR) 80 MG tablet, Take 1 tablet (80 mg total) by mouth daily at 6 PM., Disp: 30 tablet, Rfl: 30 .  cetirizine (ZYRTEC) 10 MG tablet, Take 10 mg by mouth daily., Disp: , Rfl:  .  fluticasone (FLONASE) 50 MCG/ACT nasal spray, Place 2 sprays into both nostrils daily., Disp: , Rfl:  .  Garlic 1000 MG CAPS, Take 1,000 mg by mouth daily. , Disp: , Rfl:  .  isosorbide mononitrate (IMDUR) 30 MG 24 hr tablet, Take 2 tablets (60mg ) by mouth in the morning and 1 tablet (30mg ) by mouth in the evening (Patient taking differently: Take 30-60 mg by mouth See admin instructions. Take 2 tablets (60mg ) by mouth in the morning and 1 tablet (30mg ) by mouth in the evening), Disp: 270 tablet, Rfl: 0 .  losartan (COZAAR) 50 MG tablet, Take 0.5 tablets (25 mg total) by mouth daily. (Patient taking  differently: Take 50 mg by mouth daily. ), Disp: , Rfl:  .  mometasone-formoterol (DULERA) 200-5 MCG/ACT AERO, Inhale 2 puffs into the lungs 2 (two) times daily., Disp: 1 Inhaler, Rfl: 11 .  Multiple Vitamin (MULTIVITAMIN WITH MINERALS) TABS tablet, Take 1 tablet by mouth daily., Disp: , Rfl:  .  nitroGLYCERIN (NITROSTAT) 0.4 MG SL tablet, Place 1 tablet (0.4 mg total) under the tongue every 5 (five) minutes x 3 doses as needed for chest pain., Disp: 25 tablet, Rfl: 12 .  omeprazole (PRILOSEC) 40 MG capsule, Take 1 capsule (40 mg total) by mouth 2 (two) times daily., Disp: 60 capsule, Rfl: 2 .  predniSONE (STERAPRED UNI-PAK 21 TAB) 10 MG (21) TBPK tablet, Take 1 tablet (10 mg total) by mouth daily. Take 40 mg x 3 days, 30mg  x 3 day, 20mg  x 3 days, 10mg  x 3 days, then STOP, Disp: 30 tablet, Rfl: 0 .  ranolazine (RANEXA) 1000 MG SR tablet, Take 1 tablet (1,000 mg total) by mouth 2 (two) times daily., Disp: 60 tablet, Rfl: 5 .  ticagrelor (BRILINTA) 90 MG TABS tablet, Take 1 tablet (90 mg total) by mouth 2 (two) times daily., Disp: 60 tablet, Rfl: 11  Past Medical History: Past Medical History:  Diagnosis Date  . Arthritis    "hips, lower back, knees, shoulders, hands" (05/14/2016)  . Asthma dx'd in 2014  . Bacterial septicemia (HCC) 01/2011   "both knee  involved"  . CAD (coronary artery disease)    a. NSTEMI 02/2016 s/p DES to LAD. b. Relook cath due to angina/prior high risk anatomy 05/14/16 -> continued medical therapy given concern that a stent strut could inferere with Cx itself.  . Coronary artery disease   . GERD (gastroesophageal reflux disease)   . Gilbert's disease   . Hyperlipidemia   . Hypertension   . 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 NSTEMI (non-ST elevated myocardial infarction) (HCC) 02/2016   [name] 03/02/2016  . Sinus bradycardia   . Unspecified tinnitus    right    Tobacco Use: History  Smoking Status  . Former Smoker  . Packs/day: 1.00  . Years: 5.00  . Types:  Cigarettes  . Quit date: 12/14/1968  Smokeless Tobacco  . Never Used    Labs: Recent Review Flowsheet Data    Labs for ITP Cardiac and Pulmonary Rehab Latest Ref Rng & Units 03/01/2016   Cholestrol 0 - 200 mg/dL 161   LDLCALC 0 - 99 mg/dL 93   HDL >09 mg/dL 55   Trlycerides <604 mg/dL 85   Hemoglobin V4U 4.8 - 5.6 % 5.3      Capillary Blood Glucose: No results found for: GLUCAP   Exercise Target Goals:    Exercise Program Goal: Individual exercise prescription set with THRR, safety & activity barriers. Participant demonstrates ability to understand and report RPE using BORG scale, to self-measure pulse accurately, and to acknowledge the importance of the exercise prescription.  Exercise Prescription Goal: Starting with aerobic activity 30 plus minutes a day, 3 days per week for initial exercise prescription. Provide home exercise prescription and guidelines that participant acknowledges understanding prior to discharge.  Activity Barriers & Risk Stratification:     Activity Barriers & Cardiac Risk Stratification - 04/07/16 0900      Activity Barriers & Cardiac Risk Stratification   Activity Barriers None   Cardiac Risk Stratification High      6 Minute Walk:     6 Minute Walk    Row Name 04/07/16 1636         6 Minute Walk   Phase Initial     Distance 1550 feet     Walk Time 6 minutes     # of Rest Breaks 0     MPH 2.93     METS 3.25     RPE 9     Perceived Dyspnea  9     VO2 Peak 14.1     Symptoms No     Resting HR 57 bpm     Resting BP 134/82     Max Ex. HR 108 bpm     Max Ex. BP 146/92     2 Minute Post BP 110/72        Initial Exercise Prescription:     Initial Exercise Prescription - 04/07/16 0750      Date of Initial Exercise RX and Referring Provider   Date 04/07/16   Referring Provider Dr. Rennis Golden     Treadmill   MPH 2   Grade 0   Minutes 15   METs 2.5     NuStep   Level 2   Minutes 15   METs 2     Elliptical   Level 1    Speed 1   Minutes 15   METs 1.5     Prescription Details   Frequency (times per week) 3   Duration Progress to 30 minutes of  continuous aerobic without signs/symptoms of physical distress     Intensity   THRR REST +  30   THRR 40-80% of Max Heartrate [phone]   Ratings of Perceived Exertion 11-13   Perceived Dyspnea 0-4     Progression   Progression Continue progressive overload as per policy without signs/symptoms or physical distress.     Resistance Training   Training Prescription Yes   Weight 1   Reps 10-12      Perform Capillary Blood Glucose checks as needed.  Exercise Prescription Changes:      Exercise Prescription Changes    Row Name 05/09/16 1100 05/20/16 1411 06/03/16 1500 06/10/16 1400 06/15/16 1200     Exercise Review   Progression Yes Yes  - No No     Response to Exercise   Blood Pressure (Admit) 112/82 96/52 124/80 120/70 120/70   Blood Pressure (Exercise) 148/92 112/62 182/100 150/100 150/100   Blood Pressure (Exit) 108/70 98/60 128/82 122/70 122/70   Heart Rate (Admit) 53 bpm 85 bpm 65 bpm 64 bpm 64 bpm   Heart Rate (Exercise) 84 bpm 100 bpm 96 bpm 87 bpm 87 bpm   Heart Rate (Exit) 56 bpm 81 bpm 73 bpm 73 bpm 73 bpm   Rating of Perceived Exertion (Exercise)  -  -  - 9 9   Symptoms No No no  Chest Pains.  Chest Pains.    Comments  -  -  - Patient has slowed down due to chest pains. On 06/10/16 patient stopped exercise due to chest pain and high blood pressure, 142/110. Patient has slowed down due to chest pains. On 06/10/16 patient stopped exercise due to chest pain and high blood pressure, 142/110.   Duration Progress to 30 minutes of continuous aerobic without signs/symptoms of physical distress Progress to 30 minutes of continuous aerobic without signs/symptoms of physical distress Progress to 30 minutes of continuous aerobic without signs/symptoms of physical distress Progress to 30 minutes of continuous aerobic without signs/symptoms of physical  distress Progress to 30 minutes of continuous aerobic without signs/symptoms of physical distress   Intensity Rest + 30 Rest + 30 Rest + 30 Rest + 30 Rest + 30     Progression   Progression Continue to progress workloads to maintain intensity without signs/symptoms of physical distress. Continue to progress workloads to maintain intensity without signs/symptoms of physical distress. Continue to progress workloads to maintain intensity without signs/symptoms of physical distress. Continue to progress workloads to maintain intensity without signs/symptoms of physical distress. Continue to progress workloads to maintain intensity without signs/symptoms of physical distress.     Resistance Training   Training Prescription Yes Yes Yes Yes Yes   Weight 5 5 5 5 5    Reps 10-12 10-12 10-12 10-12 10-12     Interval Training   Interval Training No No No No No     Treadmill   MPH 3 2.2 3.2 3.1 3.1   Grade 0 0 0 0 0   Minutes 15 15 15 20 20    METs 3.29 2.5 3.4 3.3 3.3     NuStep   Level  - 3  -  -  -   Minutes  - 15  -  -  -   METs  - 1.9  -  -  -     Elliptical   Level 5 5 6 3 3    Speed 80 80 124 108 108   Minutes 15 15 15 15 15    METs  6.8 6.8 6.8 6 6      Home Exercise Plan   Plans to continue exercise at Home Home Home Home Home   Frequency Add 2 additional days to program exercise sessions. Add 2 additional days to program exercise sessions. Add 2 additional days to program exercise sessions. Add 2 additional days to program exercise sessions. Add 2 additional days to program exercise sessions.   Row Name 06/29/16 1200 07/08/16 1400           Exercise Review   Progression Yes Yes        Response to Exercise   Blood Pressure (Admit) 128/80 120/82      Blood Pressure (Exercise) 150/92 160/88      Blood Pressure (Exit) 122/80 140/82      Heart Rate (Admit) 74 bpm 72 bpm      Heart Rate (Exercise) 91 bpm 94 bpm      Heart Rate (Exit) 83 bpm 75 bpm      Rating of Perceived Exertion  (Exercise) 11 11      Symptoms No No      Duration Progress to 30 minutes of continuous aerobic without signs/symptoms of physical distress Progress to 30 minutes of continuous aerobic without signs/symptoms of physical distress      Intensity Rest + 30 Rest + 30        Progression   Progression Continue to progress workloads to maintain intensity without signs/symptoms of physical distress. Continue to progress workloads to maintain intensity without signs/symptoms of physical distress.        Resistance Training   Training Prescription Yes Yes      Weight 5 5      Reps 10-12 10-12        Interval Training   Interval Training No No        Treadmill   MPH 3.1 3.4      Grade 0 0      Minutes 20 20      METs 3.4 3.6        Elliptical   Level 3 4      Speed 112 116      Minutes 15 15      METs 6 5.8        Home Exercise Plan   Plans to continue exercise at Home Home      Frequency Add 2 additional days to program exercise sessions. Add 2 additional days to program exercise sessions.         Exercise Comments:      Exercise Comments    Row Name 05/09/16 1200 07/08/16 1428         Exercise Comments Patient is progressing appropriately.  Patient doing well with proram. Will continue to monitor for progress.          Discharge Exercise Prescription (Final Exercise Prescription Changes):     Exercise Prescription Changes - 07/08/16 1400      Exercise Review   Progression Yes     Response to Exercise   Blood Pressure (Admit) 120/82   Blood Pressure (Exercise) 160/88   Blood Pressure (Exit) 140/82   Heart Rate (Admit) 72 bpm   Heart Rate (Exercise) 94 bpm   Heart Rate (Exit) 75 bpm   Rating of Perceived Exertion (Exercise) 11   Symptoms No   Duration Progress to 30 minutes of continuous aerobic without signs/symptoms of physical distress   Intensity Rest + 30     Progression   Progression  Continue to progress workloads to maintain intensity without  signs/symptoms of physical distress.     Resistance Training   Training Prescription Yes   Weight 5   Reps 10-12     Interval Training   Interval Training No     Treadmill   MPH 3.4   Grade 0   Minutes 20   METs 3.6     Elliptical   Level 4   Speed 116   Minutes 15   METs 5.8     Home Exercise Plan   Plans to continue exercise at Home   Frequency Add 2 additional days to program exercise sessions.      Nutrition:  Target Goals: Understanding of nutrition guidelines, daily intake of sodium 1500mg , cholesterol 200mg , calories 30% from fat and 7% or less from saturated fats, daily to have 5 or more servings of fruits and vegetables.  Biometrics:     Pre Biometrics - 04/09/16 1648      Pre Biometrics   Height  (1.905 m)   Weight 206 lb 11.2 oz (93.8 kg)   Waist Circumference 38.5 inches   Hip Circumference 40.5 inches   Waist to Hip Ratio 0.95 %   BMI (Calculated) 25.9   Triceps Skinfold 4 mm   % Body Fat 20.2 %   Grip Strength 84.3 kg   Flexibility 10.7 in   Single Leg Stand 60 seconds       Nutrition Therapy Plan and Nutrition Goals:     Nutrition Therapy & Goals - 04/07/16 0904      Intervention Plan   Intervention Nutrition handout(s) given to patient.   Expected Outcomes Short Term Goal: Understand basic principles of dietary content, such as calories, fat, sodium, cholesterol and nutrients.      Nutrition Discharge: Rate Your Plate Scores:     Nutrition Assessments - 04/07/16 0905      MEDFICTS Scores   Pre Score 3      Nutrition Goals Re-Evaluation:   Psychosocial: Target Goals: Acknowledge presence or absence of depression, maximize coping skills, provide positive support system. Participant is able to verbalize types and ability to use techniques and skills needed for reducing stress and depression.  Initial Review & Psychosocial Screening:     Initial Psych Review & Screening - 04/07/16 1624      Family Dynamics   Good  Support System? Yes     Barriers   Psychosocial barriers to participate in program There are no identifiable barriers or psychosocial needs.     Screening Interventions   Interventions Encouraged to exercise      Quality of Life Scores:     Quality of Life - 04/07/16 1651      Quality of Life Scores   Health/Function Pre 22.4 %   Socioeconomic Pre 29.25 %   Psych/Spiritual Pre 24 %   Family Pre 25.2 %   GLOBAL Pre 24.69 %      PHQ-9: Recent Review Flowsheet Data    Depression screen [facility] 2/9 04/07/2016   Decreased Interest 0   Down, Depressed, Hopeless 0   PHQ - 2 Score 0   Altered sleeping 0   Tired, decreased energy 0   Change in appetite 0   Feeling bad or failure about yourself  0   Trouble concentrating 0   Moving slowly or fidgety/restless 0   Suicidal thoughts 0   PHQ-9 Score 0      Psychosocial Evaluation and Intervention:  Psychosocial Evaluation - 04/07/16 1624      Psychosocial Evaluation & Interventions   Interventions Stress management education;Relaxation education;Encouraged to exercise with the program and follow exercise prescription   Continued Psychosocial Services Needed No      Psychosocial Re-Evaluation:     Psychosocial Re-Evaluation    Row Name 04/10/16 1434 05/04/16 1419 06/08/16 1149 07/08/16 1430       Psychosocial Re-Evaluation   Interventions Encouraged to attend Cardiac Rehabilitation for the exercise Encouraged to attend Cardiac Rehabilitation for the exercise Relaxation education;Encouraged to attend Cardiac Rehabilitation for the exercise;Stress management education Encouraged to attend Cardiac Rehabilitation for the exercise    Comments  -  -  - Patient is not depressed. He does not need counseling.     Continued Psychosocial Services Needed No No No No       Vocational Rehabilitation: Provide vocational rehab assistance to qualifying candidates.   Vocational Rehab Evaluation & Intervention:     Vocational  Rehab - 04/07/16 0901      Initial Vocational Rehab Evaluation & Intervention   Assessment shows need for Vocational Rehabilitation No      Education: Education Goals: Education classes will be provided on a weekly basis, covering required topics. Participant will state understanding/return demonstration of topics presented.  Learning Barriers/Preferences:     Learning Barriers/Preferences - 04/07/16 0900      Learning Barriers/Preferences   Learning Barriers None   Learning Preferences Skilled Demonstration      Education Topics: Hypertension, Hypertension Reduction -Define heart disease and high blood pressure. Discus how high blood pressure affects the body and ways to reduce high blood pressure. Flowsheet Row CARDIAC REHAB PHASE II EXERCISE from 07/01/2016 in [place] CARDIAC REHABILITATION  Date  04/15/16  Educator  Hart Rochester  Instruction Review Code  2- meets goals/outcomes      Exercise and Your Heart -Discuss why it is important to exercise, the FITT principles of exercise, normal and abnormal responses to exercise, and how to exercise safely. Flowsheet Row CARDIAC REHAB PHASE II EXERCISE from 07/01/2016 in [place] CARDIAC REHABILITATION  Date  04/22/16  Educator  Hart Rochester  Instruction Review Code  2- meets goals/outcomes      Angina -Discuss definition of angina, causes of angina, treatment of angina, and how to decrease risk of having angina. Flowsheet Row CARDIAC REHAB PHASE II EXERCISE from 07/01/2016 in [place] CARDIAC REHABILITATION  Date  04/29/16  Educator  -- [D Coad]  Instruction Review Code  2- meets goals/outcomes      Cardiac Medications -Review what the following cardiac medications are used for, how they affect the body, and side effects that may occur when taking the medications.  Medications include Aspirin, Beta blockers, calcium channel blockers, ACE Inhibitors, angiotensin receptor blockers, diuretics, digoxin, and  antihyperlipidemics. Flowsheet Row CARDIAC REHAB PHASE II EXERCISE from 07/01/2016 in [place] CARDIAC REHABILITATION  Date  05/06/16  Educator  D Coad  Instruction Review Code  2- meets goals/outcomes      Congestive Heart Failure -Discuss the definition of CHF, how to live with CHF, the signs and symptoms of CHF, and how keep track of weight and sodium intake.   Heart Disease and Intimacy -Discus the effect sexual activity has on the heart, how changes occur during intimacy as we age, and safety during sexual activity.   Smoking Cessation / COPD -Discuss different methods to quit smoking, the health benefits of quitting smoking, and the definition of COPD.   Nutrition  I: Fats -Discuss the types of cholesterol, what cholesterol does to the heart, and how cholesterol levels can be controlled. Flowsheet Row CARDIAC REHAB PHASE II EXERCISE from 07/01/2016 in [place] CARDIAC REHABILITATION  Date  06/03/16  Educator  Hart Rochester  Instruction Review Code  2- meets goals/outcomes      Nutrition II: Labels -Discuss the different components of food labels and how to read food label Flowsheet Row CARDIAC REHAB PHASE II EXERCISE from 07/01/2016 in [place] CARDIAC REHABILITATION  Date  06/10/16  Educator  Hart Rochester  Instruction Review Code  2- meets goals/outcomes      Heart Parts and Heart Disease -Discuss the anatomy of the heart, the pathway of blood circulation through the heart, and these are affected by heart disease.   Stress I: Signs and Symptoms -Discuss the causes of stress, how stress may lead to anxiety and depression, and ways to limit stress. Flowsheet Row CARDIAC REHAB PHASE II EXERCISE from 07/01/2016 in [place] CARDIAC REHABILITATION  Date  06/24/16  Educator  DJ  Instruction Review Code  2- meets goals/outcomes      Stress II: Relaxation -Discuss different types of relaxation techniques to limit stress. Flowsheet Row CARDIAC REHAB PHASE II EXERCISE  from 07/01/2016 in [place] CARDIAC REHABILITATION  Date  07/01/16  Educator  DC/DJ  Instruction Review Code  2- meets goals/outcomes      Warning Signs of Stroke / TIA -Discuss definition of a stroke, what the signs and symptoms are of a stroke, and how to identify when someone is having stroke.   Knowledge Questionnaire Score:     Knowledge Questionnaire Score - 04/07/16 0901      Knowledge Questionnaire Score   Pre Score 23/24      Core Components/Risk Factors/Patient Goals at Admission:     Personal Goals and Risk Factors at Admission - 04/07/16 0905      Core Components/Risk Factors/Patient Goals on Admission    Weight Management Weight Maintenance   Increase Strength and Stamina Yes   Intervention Provide advice, education, support and counseling about physical activity/exercise needs.   Expected Outcomes Achievement of increased cardiorespiratory fitness and enhanced flexibility, muscular endurance and strength shown through measurements of functional capacity and personal statement of participant.   Hypertension Yes   Intervention Provide education on lifestyle modifcations including regular physical activity/exercise, weight management, moderate sodium restriction and increased consumption of fresh fruit, vegetables, and low fat dairy, alcohol moderation, and smoking cessation.;Monitor prescription use compliance.   Expected Outcomes Long Term: Maintenance of blood pressure at goal levels.   Lipids Yes   Intervention Provide education and support for participant on nutrition & aerobic/resistive exercise along with prescribed medications to achieve LDL 70mg , HDL >40mg .   Expected Outcomes Long Term: Cholesterol controlled with medications as prescribed, with individualized exercise RX and with personalized nutrition plan. Value goals: LDL < 70mg , HDL > 40 mg.      Core Components/Risk Factors/Patient Goals Review:      Goals and Risk Factor Review    Row Name  04/07/16 1624 04/10/16 1433 05/04/16 1417 06/08/16 1148 07/08/16 1428     Core Components/Risk Factors/Patient Goals Review   Personal Goals Review Increase Strength and Stamina;Hypertension;Lipids  - Increase Strength and Stamina;Hypertension;Lipids Increase Strength and Stamina;Hypertension;Lipids Increase Strength and Stamina   Review  - no progress. patient will start program 04/13/16 Patient is progressing will in the program. BP's have been WNL so far.   Pt has had episodes of  CP in past 30 days with visits to cardiologist.  Has had some High BPs. Meds have been adjusted. Pt continues to be determined. support and encouragement given.  Patient is getting stronger and has increased his activities. Patient has returned to playing golf.    Expected Outcomes  -  - increase strength, Controlled HTN, Lipids increase strength, Controlled HTN, Lipids Continue to get stronger and increase activity.       Core Components/Risk Factors/Patient Goals at Discharge (Final Review):      Goals and Risk Factor Review - 07/08/16 1428      Core Components/Risk Factors/Patient Goals Review   Personal Goals Review Increase Strength and Stamina   Review Patient is getting stronger and has increased his activities. Patient has returned to playing golf.    Expected Outcomes Continue to get stronger and increase activity.       ITP Comments:     ITP Comments    Row Name 04/07/16 0901           ITP Comments patient is a 65 year old that lives with wife and enjoys playing golf play with old cars.  Patient shows no s/s of depression.           Comments:Patient doing well with program. Will continue to monitor for progress.

## 2016-07-10 ENCOUNTER — Encounter (HOSPITAL_COMMUNITY)
Admission: RE | Admit: 2016-07-10 | Discharge: 2016-07-10 | Disposition: A | Discharge status: Home / Self Care | Payer: Medicare Other | Source: Ambulatory Visit | Attending: Internal Medicine | Admitting: Internal Medicine

## 2016-07-10 DIAGNOSIS — I214 Non-ST elevation (NSTEMI) myocardial infarction: Secondary | ICD-10-CM

## 2016-07-10 NOTE — Progress Notes (Signed)
Daily Session Note  Patient Details  Name: Louis Perez MRN: [id number] Date of Birth: Jul 01, 1951 Referring Provider:   Flowsheet Row CARDIAC REHAB PHASE II ORIENTATION from 04/07/2016 in [place]  Referring Provider  Dr. Debara Pickett      Encounter Date: 07/10/2016  Check In:     Session Check In - 07/10/16 0815      Check-In   Location [facility]   Staff Present Diane Angelina Pih, MS, EP, [facility], Exercise Physiologist;Gregory Luther Parody, BS, EP, Exercise Physiologist;Tashara Suder Wynetta Emery, RN, BSN   Supervising physician immediately available to respond to emergencies See telemetry face sheet for immediately available MD   Fall or balance concerns reported    No   Warm-up and Cool-down Performed as group-led instruction   Resistance Training Performed Yes   VAD Patient? No     Pain Assessment   Currently in Pain? No/denies   Pain Score 0-No pain   Multiple Pain Sites No      Capillary Blood Glucose: No results found for this or any previous visit (from the past 24 hour(s)).   Goals Met:  Independence with exercise equipment Exercise tolerated well No report of cardiac concerns or symptoms Strength training completed today  Goals Unmet:  Not Applicable  Comments: Check out 915.   Dr. Kate Sable is Medical Director for [place].

## 2016-07-13 ENCOUNTER — Encounter (HOSPITAL_COMMUNITY)
Admission: RE | Admit: 2016-07-13 | Discharge: 2016-07-13 | Disposition: A | Discharge status: Home / Self Care | Payer: Medicare Other | Source: Ambulatory Visit | Attending: Internal Medicine | Admitting: Internal Medicine

## 2016-07-13 DIAGNOSIS — Z955 Presence of coronary angioplasty implant and graft: Secondary | ICD-10-CM

## 2016-07-13 DIAGNOSIS — I214 Non-ST elevation (NSTEMI) myocardial infarction: Secondary | ICD-10-CM

## 2016-07-13 NOTE — Progress Notes (Signed)
Daily Session Note  Patient Details  Name: Louis Perez MRN: [id number] Date of Birth: 11/03/1951 Referring Provider:   Flowsheet Row CARDIAC REHAB PHASE II ORIENTATION from 04/07/2016 in [place]  Referring Provider  Dr. Debara Pickett      Encounter Date: 07/13/2016  Check In:     Session Check In - 07/13/16 0826      Check-In   Location [facility]   Staff Present Russella Dar, MS, EP, [facility], Exercise Physiologist;Debra Wynetta Emery, RN, BSN;Kineta Fudala, BS, EP, Exercise Physiologist   Supervising physician immediately available to respond to emergencies See telemetry face sheet for immediately available MD   Medication changes reported     No   Fall or balance concerns reported    No   Warm-up and Cool-down Performed as group-led instruction   Resistance Training Performed Yes   VAD Patient? No     Pain Assessment   Currently in Pain? No/denies   Pain Score 0-No pain   Multiple Pain Sites No      Capillary Blood Glucose: No results found for this or any previous visit (from the past 24 hour(s)).   Goals Met:  Independence with exercise equipment Exercise tolerated well No report of cardiac concerns or symptoms Strength training completed today  Goals Unmet:  Not Applicable  Comments: Check out 915   Dr. Kate Sable is Medical Director for [place].

## 2016-07-15 ENCOUNTER — Encounter (HOSPITAL_COMMUNITY)
Admission: RE | Admit: 2016-07-15 | Discharge: 2016-07-15 | Disposition: A | Discharge status: Home / Self Care | Payer: Medicare Other | Source: Ambulatory Visit | Attending: Internal Medicine | Admitting: Internal Medicine

## 2016-07-15 DIAGNOSIS — Z955 Presence of coronary angioplasty implant and graft: Secondary | ICD-10-CM

## 2016-07-15 DIAGNOSIS — I214 Non-ST elevation (NSTEMI) myocardial infarction: Secondary | ICD-10-CM | POA: Diagnosis not present

## 2016-07-15 NOTE — Progress Notes (Signed)
Daily Session Note  Patient Details  Name: JAILIN MOOMAW MRN: [id number] Date of Birth: 08/17/1951 Referring Provider:   Flowsheet Row CARDIAC REHAB PHASE II ORIENTATION from 04/07/2016 in [place]  Referring Provider  Dr. Debara Pickett      Encounter Date: 07/15/2016  Check In:     Session Check In - 07/15/16 0823      Check-In   Location [facility]   Staff Present Russella Dar, MS, EP, [facility], Exercise Physiologist;Debra Wynetta Emery, RN, BSN;Gregory Cowan, BS, EP, Exercise Physiologist   Supervising physician immediately available to respond to emergencies See telemetry face sheet for immediately available MD   Medication changes reported     No   Comments Patient taken prednisone for asthma    Fall or balance concerns reported    No   Warm-up and Cool-down Performed as group-led instruction   Resistance Training Performed Yes   VAD Patient? No     Pain Assessment   Pain Score 0-No pain   Multiple Pain Sites No      Capillary Blood Glucose: No results found for this or any previous visit (from the past 24 hour(s)).   Goals Met:  Independence with exercise equipment Exercise tolerated well No report of cardiac concerns or symptoms Strength training completed today  Goals Unmet:  Not Applicable  Comments: Check out: 0915   Dr. Kate Sable is Medical Director for [place] and Pulmonary Rehab.

## 2016-07-17 ENCOUNTER — Encounter (HOSPITAL_COMMUNITY)
Admission: RE | Admit: 2016-07-17 | Discharge: 2016-07-17 | Disposition: A | Discharge status: Home / Self Care | Payer: Medicare Other | Source: Ambulatory Visit | Attending: Internal Medicine | Admitting: Internal Medicine

## 2016-07-17 DIAGNOSIS — I214 Non-ST elevation (NSTEMI) myocardial infarction: Secondary | ICD-10-CM

## 2016-07-17 NOTE — Progress Notes (Signed)
Daily Session Note  Patient Details  Name: DONALDSON RICHTER MRN: [id number] Date of Birth: June 16, 1951 Referring Provider:   Flowsheet Row CARDIAC REHAB PHASE II ORIENTATION from 04/07/2016 in [place]  Referring Provider  Dr. Debara Pickett      Encounter Date: 07/17/2016  Check In:     Session Check In - 07/17/16 0815      Check-In   Location [facility]   Staff Present Russella Dar, MS, EP, [facility], Exercise Physiologist;Meri Pelot Wynetta Emery, RN, BSN   Supervising physician immediately available to respond to emergencies See telemetry face sheet for immediately available MD   Medication changes reported     No   Fall or balance concerns reported    No   Warm-up and Cool-down Performed as group-led instruction   Resistance Training Performed Yes   VAD Patient? No     Pain Assessment   Currently in Pain? No/denies   Pain Score 0-No pain   Multiple Pain Sites No      Capillary Blood Glucose: No results found for this or any previous visit (from the past 24 hour(s)).   Goals Met:  Independence with exercise equipment Exercise tolerated well No report of cardiac concerns or symptoms Strength training completed today  Goals Unmet:  Not Applicable  Comments: Check out 915.   Dr. Kate Sable is Medical Director for [facility] Cardiac and Pulmonary Rehab.

## 2016-07-20 ENCOUNTER — Encounter (HOSPITAL_COMMUNITY)
Admission: RE | Admit: 2016-07-20 | Discharge: 2016-07-20 | Disposition: A | Discharge status: Home / Self Care | Payer: Medicare Other | Source: Ambulatory Visit | Attending: Internal Medicine | Admitting: Internal Medicine

## 2016-07-20 DIAGNOSIS — Z955 Presence of coronary angioplasty implant and graft: Secondary | ICD-10-CM

## 2016-07-20 DIAGNOSIS — I214 Non-ST elevation (NSTEMI) myocardial infarction: Secondary | ICD-10-CM

## 2016-07-20 NOTE — Progress Notes (Signed)
Daily Session Note  Patient Details  Name: Louis Perez MRN: [id number] Date of Birth: 26-Mar-1951 Referring Provider:   Flowsheet Row CARDIAC REHAB PHASE II ORIENTATION from 04/07/2016 in [place]  Referring Provider  Dr. Debara Pickett      Encounter Date: 07/20/2016  Check In:     Session Check In - 07/20/16 0856      Check-In   Location [facility]   Staff Present Chessa Barrasso Angelina Pih, MS, EP, [facility], Exercise Physiologist;Gregory Luther Parody, BS, EP, Exercise Physiologist;Debra Wynetta Emery, RN, BSN   Supervising physician immediately available to respond to emergencies See telemetry face sheet for immediately available MD   Medication changes reported     No   Fall or balance concerns reported    No   Warm-up and Cool-down Performed as group-led instruction   Resistance Training Performed Yes   VAD Patient? No     Pain Assessment   Currently in Pain? No/denies   Pain Score 0-No pain   Multiple Pain Sites No      Capillary Blood Glucose: No results found for this or any previous visit (from the past 24 hour(s)).   Goals Met:  Independence with exercise equipment Exercise tolerated well No report of cardiac concerns or symptoms Strength training completed today  Goals Unmet:  Not Applicable  Comments: Check out 915   Dr. Kate Sable is Medical Director for [place] and Pulmonary Rehab.

## 2016-07-22 ENCOUNTER — Encounter (HOSPITAL_COMMUNITY)
Admission: RE | Admit: 2016-07-22 | Discharge: 2016-07-22 | Disposition: A | Discharge status: Home / Self Care | Payer: Medicare Other | Source: Ambulatory Visit | Attending: Internal Medicine | Admitting: Internal Medicine

## 2016-07-22 ENCOUNTER — Encounter: Payer: Self-pay | Admitting: Internal Medicine

## 2016-07-22 DIAGNOSIS — I214 Non-ST elevation (NSTEMI) myocardial infarction: Secondary | ICD-10-CM | POA: Diagnosis not present

## 2016-07-22 NOTE — Progress Notes (Signed)
Daily Session Note  Patient Details  Name: Louis Perez MRN: [id number] Date of Birth: 03-Apr-1951 Referring Provider:   Flowsheet Row CARDIAC REHAB PHASE II ORIENTATION from 04/07/2016 in [place]  Referring Provider  Dr. Debara Pickett      Encounter Date: 07/22/2016  Check In:     Session Check In - 07/22/16 0815      Check-In   Location [facility]   Staff Present Meri Pelot Angelina Pih, MS, EP, [facility], Exercise Physiologist;Gregory Luther Parody, BS, EP, Exercise Physiologist;Debra Wynetta Emery, RN, BSN   Supervising physician immediately available to respond to emergencies See telemetry face sheet for immediately available MD   Medication changes reported     No   Comments Patient taken prednisone for asthma    Fall or balance concerns reported    No   Warm-up and Cool-down Performed as group-led instruction   Resistance Training Performed Yes   VAD Patient? No     Pain Assessment   Currently in Pain? No/denies   Pain Score 0-No pain   Multiple Pain Sites No      Capillary Blood Glucose: No results found for this or any previous visit (from the past 24 hour(s)).      Exercise Prescription Changes - 07/21/16 1300      Exercise Review   Progression Yes     Response to Exercise   Blood Pressure (Admit) 116/62   Blood Pressure (Exercise) 150/92   Blood Pressure (Exit) 124/76   Heart Rate (Admit) 68 bpm   Heart Rate (Exercise) 91 bpm   Heart Rate (Exit) 77 bpm   Rating of Perceived Exertion (Exercise) 12   Symptoms No   Duration Progress to 30 minutes of continuous aerobic without signs/symptoms of physical distress   Intensity Rest + 30     Progression   Progression Continue to progress workloads to maintain intensity without signs/symptoms of physical distress.     Resistance Training   Training Prescription Yes   Weight 8   Reps 10-12     Interval Training   Interval Training No     Treadmill   MPH 3.4   Grade 0.5   Minutes 20   METs  3.8     Elliptical   Level 4   Speed 116   Minutes 15   METs 6.5     Home Exercise Plan   Plans to continue exercise at Home   Frequency Add 2 additional days to program exercise sessions.     Goals Met:  Independence with exercise equipment Exercise tolerated well No report of cardiac concerns or symptoms Strength training completed today  Goals Unmet:  Not Applicable  Comments: Check out: 915   Dr. Kate Sable is Medical Director for [place] and Pulmonary Rehab.

## 2016-07-23 ENCOUNTER — Ambulatory Visit (INDEPENDENT_AMBULATORY_CARE_PROVIDER_SITE_OTHER): Payer: Medicare Other | Admitting: Internal Medicine

## 2016-07-23 ENCOUNTER — Encounter (INDEPENDENT_AMBULATORY_CARE_PROVIDER_SITE_OTHER): Payer: Self-pay

## 2016-07-23 ENCOUNTER — Encounter: Payer: Self-pay | Admitting: Internal Medicine

## 2016-07-23 VITALS — BP 126/72 | HR 66 | Ht 75.0 in | Wt 199.2 lb

## 2016-07-23 DIAGNOSIS — I200 Unstable angina: Secondary | ICD-10-CM | POA: Diagnosis not present

## 2016-07-23 DIAGNOSIS — E785 Hyperlipidemia, unspecified: Secondary | ICD-10-CM | POA: Diagnosis not present

## 2016-07-23 DIAGNOSIS — R079 Chest pain, unspecified: Secondary | ICD-10-CM | POA: Diagnosis not present

## 2016-07-23 DIAGNOSIS — I2510 Atherosclerotic heart disease of native coronary artery without angina pectoris: Secondary | ICD-10-CM | POA: Diagnosis not present

## 2016-07-23 NOTE — Progress Notes (Signed)
Cardiology Office Note   Date:  07/23/2016   ID:  Louis Perez, DOB [birth year], MRN [id number]  PCP:  Delorse Lek, MD  Cardiologist:   Chrystie Nose, MD   Chief Complaint  Patient presents with  . Follow-up      History of Present Illness: Louis Perez is a 65 y.o. male who presents for evaluation of chest and back pain. Patient has a history of coronary disease with recent NSTEMI in March with DES stenting of the LAD.  He had non obstructive disease elsewhere.  EF was normal.    He was added to my schedule today for evaluation of chest and arm discomfort. This began on Sunday. It is similar to his previous unstable angina but not as severe. Still he describes the peak of his discomfort at 7 out of 10 in intensity. He gets across his chest. His left arm hurt. At times he has noticed some diaphoresis. It might last for only a minute. He has taken nitroglycerin and over the course of the last few days and it improves the symptoms. He describes it as somewhat heavy. He's not had any palpitations, presyncope or syncope. He's not describing any new shortness of breath, PND or orthopnea. He has brought the discomfort on a few times when he's been doing activities although this is been intermittent. He was actually able to do a little golfing yesterday without symptoms. However, prior to this he had discomfort with golfing building his children sandbox. This morning the pain was at rest. He still describes a little mid chest tightness.  Of note he has not had the symptoms since his stent.  I did review his catheterization. I looked at the images and he had a very high grade proximal LAD in March with nonobstructive disease in the right coronary.  05/28/2016  Louis Perez is a 65 year old patient who I last saw in 2014. He did not see me in follow-up since that time and is seen a number of other providers in our practice, ultimately leading to presentation with an STEMI in March  with a placement of a drug-eluting stent in the proximal LAD. Subsequent to that he had chest pain as described above and was referred back to the hospital for coronary evaluation. Catheterization indicated an 80% ostial ramus lesion at the area of partially jailed vessel due to the proximal LAD stent. It was felt that intervention could compromise the LAD therefore medical therapy was recommended. His isosorbide was doubled to 60 mg daily. Since then he reports he has had some improvement in chest discomfort but has significant headache.  06/15/2016  Louis Perez was seen back today in the office for chest pain. When I last saw him we had increased his isosorbide up to 60 mg daily and he's been on Ranexa thousand milligrams twice a day. He reports he continues to have episodes of chest discomfort which can occur at rest or with exertion. I've advised that he stop cardiac rehabilitation until we can determine whether or not his pain is related to coronary obstruction.  07/23/2016  Louis Perez returns for follow-up today. He underwent repeat cardiac catheterization by Dr. Excell Seltzer. This showed the following:  Conclusion    Prox RCA to Mid RCA lesion, 25% stenosed.  Ost LM lesion, 25% stenosed.  Ost Ramus lesion, 75% stenosed.  Mid LAD to Dist LAD lesion, 50% stenosed.  Ost 2nd Diag to 2nd Diag lesion, 30% stenosed.  1. Continued patency  of the proximal LAD stent 2. Mild diffuse nonobstructive RCA stenosis with diffuse coronary ectasia 3. Patent left circumflex 4. Moderate-severe stenosis of the ramus intermedius with negative pressure wire analysis (FFR = 0.92 baseline and 0.85 at peak hyperemia)  Recommend: resume cardiac rehab, medical therapy   No intervention was performed. Since discharge she notes that he's had no further chest pain. He feels like the change in his isosorbide is been helpful. He is about to finish card agreeable dictation and [facility]. He's  lost about 5-7 pounds and is well within the normal weight range at this time.   Past Medical History:  Diagnosis Date  . Arthritis    "hips, lower back, knees, shoulders, hands" (05/14/2016)  . Asthma dx'd in 2014  . Bacterial septicemia (HCC) 01/2011   "both knee involved"  . CAD (coronary artery disease)    a. NSTEMI 02/2016 s/p DES to LAD. b. Relook cath due to angina/prior high risk anatomy 05/14/16 -> continued medical therapy given concern that a stent strut could inferere with Cx itself.  . Coronary artery disease   . GERD (gastroesophageal reflux disease)   . Gilbert's disease   . Hyperlipidemia   . Hypertension   . 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 NSTEMI (non-ST elevated myocardial infarction) (HCC) 02/2016   [name]/notes 03/02/2016  . Sinus bradycardia   . Unspecified tinnitus    right    Past Surgical History:  Procedure Laterality Date  . BACK SURGERY    . CARDIAC CATHETERIZATION N/A 03/01/2016   Procedure: Left Heart Cath and Coronary Angiography;  Surgeon: Tonny BollmanMichael Cooper, MD;  oLAD 95%, CFX/RI/RCA/RPDA 25-30%  . CARDIAC CATHETERIZATION N/A 03/01/2016   Procedure: Coronary Stent Intervention;  Surgeon: Tonny BollmanMichael Cooper, MD;  Promus Premier 4.0 x  12 mm DES to oLAD  . CARDIAC CATHETERIZATION N/A 05/14/2016   Procedure: Left Heart Cath and Coronary Angiography;  Surgeon: Lennette Biharihomas A Kelly, MD;  [facility];  Service: Cardiovascular;  Laterality: N/A;  . CARDIAC CATHETERIZATION N/A 06/17/2016   Procedure: Intravascular Pressure Wire/FFR Study;  Surgeon: Tonny BollmanMichael Cooper, MD;  [facility];  Service: Cardiovascular;  Laterality: N/A;  . CARDIOVASCULAR STRESS TEST  05/26/2012   EKG negative for ischemia, no ECG changes, no significant ischemia noted  . I&D KNEE WITH POLY EXCHANGE Bilateral 01/2011   & complete synovectomy of 3 compartments of the right total knee/notes 02/04/2011 (replacement,partial knee replacement because of bacterial infection)  .  JOINT REPLACEMENT    . KNEE ARTHROSCOPY Right ~ 2002  . LUMBAR DISC SURGERY  ~ 1980; 2000  . NASAL SINUS SURGERY  08/2011  . SHOULDER ARTHROSCOPY W/ ROTATOR CUFF REPAIR Right 01/2015  . TOTAL KNEE ARTHROPLASTY Bilateral 03/26/2004-09/15/2004    right-left  . TRANSTHORACIC ECHOCARDIOGRAM  05/26/2012   EF >55%, mild concentric LVH     Current Outpatient Prescriptions  Medication Sig Dispense Refill  . acetaminophen (TYLENOL) 500 MG tablet Take 500-1,000 mg by mouth 2 (two) times daily as needed for headache.     . albuterol (PROAIR HFA) 108 (90 BASE) MCG/ACT inhaler 2 puffs every 4 hours as needed only  if your can't catch your breath (Patient taking differently: Inhale 2 puffs into the lungs every 4 (four) hours as needed for shortness of breath. ) 1 Inhaler 1  . aspirin 81 MG tablet Take 81 mg by mouth at bedtime.     Marland Kitchen. atorvastatin (LIPITOR) 80 MG tablet  Take 1 tablet (80 mg total) by mouth daily at 6 PM. 30 tablet 30  . cetirizine (ZYRTEC) 10 MG tablet Take 10 mg by mouth daily.    . fluticasone (FLONASE) 50 MCG/ACT nasal spray Place 2 sprays into both nostrils daily.    . Garlic 1000 MG CAPS Take 1,000 mg by mouth daily.     . isosorbide mononitrate (IMDUR) 30 MG 24 hr tablet Take 2 tablets ( ) by mouth in the morning and 1 tablet ( ) by mouth in the evening (Patient taking differently: Take 30-60 mg by mouth See admin instructions. Take 2 tablets ( ) by mouth in the morning and 1 tablet ( ) by mouth in the evening) 270 tablet 0  . losartan (COZAAR) 50 MG tablet Take 0.5 tablets (25 mg total) by mouth daily. (Patient taking differently: Take 50 mg by mouth daily. )    . mometasone-formoterol (DULERA) 200-5 MCG/ACT AERO Inhale 2 puffs into the lungs 2 (two) times daily. 1 Inhaler 11  . Multiple Vitamin (MULTIVITAMIN WITH MINERALS) TABS tablet Take 1 tablet by mouth daily.    . nitroGLYCERIN (NITROSTAT) 0.4 MG SL tablet Place 1 tablet (0.4 mg total) under the tongue every 5 (five)  minutes x 3 doses as needed for chest pain. 25 tablet 12  . omeprazole (PRILOSEC) 40 MG capsule Take 1 capsule (40 mg total) by mouth 2 (two) times daily. 60 capsule 2  . predniSONE (STERAPRED UNI-PAK 21 TAB) 10 MG (21) TBPK tablet Take 1 tablet (10 mg total) by mouth daily. Take 40 mg x 3 days,  x 3 day,  x 3 days,  x 3 days, then STOP 30 tablet 0  . ranolazine (RANEXA) 1000 MG SR tablet Take 1 tablet (1,000 mg total) by mouth 2 (two) times daily. 60 tablet 5  . ticagrelor (BRILINTA) 90 MG TABS tablet Take 1 tablet (90 mg total) by mouth 2 (two) times daily. 60 tablet 11   No current facility-administered medications for this visit.     Allergies:   Oxycontin [oxycodone hcl]; Percocet [oxycodone-acetaminophen]; and Ambien [zolpidem tartrate]    Social History:  The patient  reports that he quit smoking about 47 years ago. His smoking use included Cigarettes. He has a 5.00 pack-year smoking history. He has never used smokeless tobacco. He reports that he drinks about 0.6 oz of alcohol per week . He reports that he does not use drugs.   Family History:  The patient's family history includes Alzheimer's disease in his father; Dementia in his father; Heart disease in his mother; Heart disease (age of onset: 18) in his brother.    ROS:  Please see the history of present illness.   Otherwise, review of systems are positive for none.   All other systems are reviewed and negative.    PHYSICAL EXAM: VS:  BP 126/72   Pulse 66   Ht  (1.905 m)   Wt 199 lb 3.2 oz (90.4 kg)   BMI 24.90 kg/m  , BMI Body mass index is 24.9 kg/m. General appearance: alert and no distress Lungs: clear to auscultation bilaterally Heart: regular rate and rhythm, S1, S2 normal, no murmur, click, rub or gallop Extremities: extremities normal, atraumatic, no cyanosis or edema Neurologic: Grossly normal  EKG:   Deferred  Recent Labs: 03/01/2016: B Natriuretic Peptide 51.0; Magnesium 1.9 05/14/2016: ALT  32 06/17/2016: BUN 16; Creatinine, Ser 0.84; Hemoglobin 16.5; Platelets 226; Potassium 4.4; Sodium 137    Lipid Panel    Component Value  Date/Time   CHOL 165 03/01/2016 0448   TRIG 85 03/01/2016 0448   HDL 55 03/01/2016 0448   CHOLHDL 3.0 03/01/2016 0448   VLDL 17 03/01/2016 0448   LDLCALC 93 03/01/2016 0448      Wt Readings from Last 3 Encounters:  07/23/16 199 lb 3.2 oz (90.4 kg)  07/07/16 205 lb (93 kg)  06/17/16 200 lb (90.7 kg)      Other studies Reviewed: Additional studies/ records that were reviewed today include: Cath films. Review of the above records demonstrates:  Please see elsewhere in the note.     ASSESSMENT AND PLAN:  Patient Active Problem List   Diagnosis Date Noted  . Anginal chest pain at rest [facility]) 06/17/2016  . CAD in native artery 06/15/2016  . Stented coronary artery 06/15/2016  . Chest pain, unspecified 05/28/2016  . Sinus bradycardia 05/15/2016  . Unstable angina (HCC) 05/14/2016  . CAD (coronary artery disease), native coronary artery 05/14/2016  . Dyslipidemia 09/28/2013  . GERD (gastroesophageal reflux disease) 09/28/2013  . 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 Moderate persistent chronic asthma with acute exacerbation 06/05/2013  . Hypertension 06/05/2013   PLAN: Mr. [place] Is now doing better without any further angina. He should stay on increased dose isosorbide. There was moderate nonobstructive coronary disease which was FFR negative in the ramus intermedius. It is possible this could progress however will continue to optimize medical therapy. He should continue exercise. I provided a release note today for massage therapy which should not be a problem.  Follow-up with me in 6 months.  Chrystie Nose, MD, [facility] Attending Cardiologist [facility]  Chrystie Nose, MD  07/23/2016 1:49 PM

## 2016-07-23 NOTE — Patient Instructions (Addendum)
Your physician wants you to follow-up in: 6 months with Dr. [name]. You will receive a reminder letter in the mail two months in advance. If you don't receive a letter, please call our office to schedule the follow-up appointment.  Your physician recommends that you continue on your current medications as directed. Please refer to the Current Medication list given to you today.  

## 2016-07-24 ENCOUNTER — Encounter (HOSPITAL_COMMUNITY)
Admission: RE | Admit: 2016-07-24 | Discharge: 2016-07-24 | Disposition: A | Discharge status: Home / Self Care | Payer: Medicare Other | Source: Ambulatory Visit | Attending: Internal Medicine | Admitting: Internal Medicine

## 2016-07-24 DIAGNOSIS — I214 Non-ST elevation (NSTEMI) myocardial infarction: Secondary | ICD-10-CM | POA: Diagnosis not present

## 2016-07-24 DIAGNOSIS — Z955 Presence of coronary angioplasty implant and graft: Secondary | ICD-10-CM

## 2016-07-24 NOTE — Progress Notes (Signed)
Daily Session Note  Patient Details  Name: Louis Perez MRN: [id number] Date of Birth: [birth year] Referring Provider:   Flowsheet Row CARDIAC REHAB PHASE II ORIENTATION from 04/07/2016 in [place]  Referring Provider  Dr. Debara Pickett      Encounter Date: 07/24/2016  Check In:     Session Check In - 07/24/16 0815      Check-In   Location [facility]   Staff Present Russella Dar, MS, EP, [facility], Exercise Physiologist;Debra Wynetta Emery, RN, BSN;Jayel Inks, BS, EP, Exercise Physiologist   Supervising physician immediately available to respond to emergencies See telemetry face sheet for immediately available MD   Medication changes reported     No   Fall or balance concerns reported    No   Warm-up and Cool-down Performed as group-led instruction   Resistance Training Performed Yes   VAD Patient? No     Pain Assessment   Currently in Pain? No/denies   Pain Score 0-No pain   Multiple Pain Sites No      Capillary Blood Glucose: No results found for this or any previous visit (from the past 24 hour(s)).   Goals Met:  Independence with exercise equipment Exercise tolerated well No report of cardiac concerns or symptoms Strength training completed today  Goals Unmet:  Not Applicable  Comments: Check out 915   Dr. Kate Sable is Medical Director for [place] and Pulmonary Rehab.

## 2016-07-27 ENCOUNTER — Encounter (HOSPITAL_COMMUNITY)
Admission: RE | Admit: 2016-07-27 | Discharge: 2016-07-27 | Disposition: A | Discharge status: Home / Self Care | Payer: Medicare Other | Source: Ambulatory Visit | Attending: Internal Medicine | Admitting: Internal Medicine

## 2016-07-27 VITALS — Ht 73.0 in | Wt 197.3 lb

## 2016-07-27 DIAGNOSIS — I214 Non-ST elevation (NSTEMI) myocardial infarction: Secondary | ICD-10-CM

## 2016-07-27 NOTE — Progress Notes (Signed)
Daily Session Note  Patient Details  Name: Louis Perez MRN: [id number] Date of Birth: 31-Mar-1951 Referring Provider:   Flowsheet Row CARDIAC REHAB PHASE II ORIENTATION from 04/07/2016 in [place]  Referring Provider  Dr. Debara Pickett      Encounter Date: 07/27/2016  Check In:     Session Check In - 07/27/16 0815      Check-In   Location [facility]   Staff Present Russella Dar, MS, EP, [facility], Exercise Physiologist;Daison Braxton Luther Parody, BS, EP, Exercise Physiologist   Supervising physician immediately available to respond to emergencies See telemetry face sheet for immediately available MD   Medication changes reported     No   Fall or balance concerns reported    No   Warm-up and Cool-down Performed as group-led instruction   Resistance Training Performed Yes   VAD Patient? No     Pain Assessment   Currently in Pain? No/denies   Pain Score 0-No pain   Multiple Pain Sites No      Capillary Blood Glucose: No results found for this or any previous visit (from the past 24 hour(s)).   Goals Met:  Independence with exercise equipment Exercise tolerated well No report of cardiac concerns or symptoms Strength training completed today  Goals Unmet:  Not Applicable  Comments: Check out 915   Dr. Kate Sable is Medical Director for [place] and [facility].

## 2016-07-29 ENCOUNTER — Encounter (HOSPITAL_COMMUNITY): Payer: Medicare Other

## 2016-07-30 NOTE — Progress Notes (Signed)
Cardiac Individual Treatment Plan  Patient Details  Name: Louis Perez E Yadav MRN: [id number] Date of Birth: Dec 14, 1951 Referring Provider:   Flowsheet Row CARDIAC REHAB PHASE II ORIENTATION from 04/07/2016 in [facility]  Referring Provider  Dr. Rennis GoldenHilty      Initial Encounter Date:  Flowsheet Row CARDIAC REHAB PHASE II [facility] [place] [facility]  Date  04/07/16  Referring Provider  Dr. Rennis GoldenHilty      Visit Diagnosis: NSTEMI (non-ST elevation myocardial infarction) [facility](HCC)  Patient's Home Medications on Admission:  Current Outpatient Prescriptions:  .  acetaminophen (TYLENOL) 500 MG tablet, Take 500-1,000 mg by mouth 2 (two) times daily as needed for headache. , Disp: , Rfl:  .  albuterol (PROAIR HFA) 108 (90 BASE) MCG/ACT inhaler, 2 puffs every 4 hours as needed only  if your can't catch your breath (Patient taking differently: Inhale 2 puffs into the lungs every 4 (four) hours as needed for shortness of breath. ), Disp: 1 Inhaler, Rfl: 1 .  aspirin 81 MG tablet, Take 81 mg by mouth at bedtime. , Disp: , Rfl:  .  atorvastatin (LIPITOR) 80 MG tablet, Take 1 tablet (80 mg total) by mouth daily at 6 PM., Disp: 30 tablet, Rfl: 30 .  cetirizine (ZYRTEC) 10 MG tablet, Take 10 mg by mouth daily., Disp: , Rfl:  .  fluticasone (FLONASE) 50 MCG/ACT nasal spray, Place 2 sprays into both nostrils daily., Disp: , Rfl:  .  Garlic 1000 MG CAPS, Take 1,000 mg by mouth daily. , Disp: , Rfl:  .  isosorbide mononitrate (IMDUR) 30 MG 24 hr tablet, Take 2 tablets (60mg ) by mouth in the morning and 1 tablet (30mg ) by mouth in the evening (Patient taking differently: Take 30-60 mg by mouth See admin instructions. Take 2 tablets (60mg ) by mouth in the morning and 1 tablet (30mg ) by mouth in the evening), Disp: 270 tablet, Rfl: 0 .  losartan (COZAAR) 50 MG tablet, Take 0.5 tablets (25 mg total) by mouth daily. (Patient taking differently: Take 50 mg by mouth  daily. ), Disp: , Rfl:  .  mometasone-formoterol (DULERA) 200-5 MCG/ACT AERO, Inhale 2 puffs into the lungs 2 (two) times daily., Disp: 1 Inhaler, Rfl: 11 .  Multiple Vitamin (MULTIVITAMIN WITH MINERALS) TABS tablet, Take 1 tablet by mouth daily., Disp: , Rfl:  .  nitroGLYCERIN (NITROSTAT) 0.4 MG SL tablet, Place 1 tablet (0.4 mg total) under the tongue every 5 (five) minutes x 3 doses as needed for chest pain., Disp: 25 tablet, Rfl: 12 .  omeprazole (PRILOSEC) 40 MG capsule, Take 1 capsule (40 mg total) by mouth 2 (two) times daily., Disp: 60 capsule, Rfl: 2 .  predniSONE (STERAPRED UNI-PAK 21 TAB) 10 MG (21) TBPK tablet, Take 1 tablet (10 mg total) by mouth daily. Take 40 mg x 3 days, 30mg  x 3 day, 20mg  x 3 days, 10mg  x 3 days, then STOP, Disp: 30 tablet, Rfl: 0 .  ranolazine (RANEXA) 1000 MG SR tablet, Take 1 tablet (1,000 mg total) by mouth 2 (two) times daily., Disp: 60 tablet, Rfl: 5 .  ticagrelor (BRILINTA) 90 MG TABS tablet, Take 1 tablet (90 mg total) by mouth 2 (two) times daily., Disp: 60 tablet, Rfl: 11  Past Medical History: Past Medical History:  Diagnosis Date  . Arthritis    "hips, lower back, knees, shoulders, hands" (05/14/2016)  . Asthma dx'd in 2014  . Bacterial septicemia (HCC) 01/2011   "both knee involved"  . CAD (  coronary artery disease)    a. NSTEMI 02/2016 s/p DES to LAD. b. Relook cath due to angina/prior high risk anatomy 05/14/16 -> continued medical therapy given concern that a stent strut could inferere with Cx itself.  . Coronary artery disease   . GERD (gastroesophageal reflux disease)   . Gilbert's disease   . Hyperlipidemia   . Hypertension   . 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 NSTEMI (non-ST elevated myocardial infarction) (HCC) 02/2016   [name] 03/02/2016  . Sinus bradycardia   . Unspecified tinnitus    right    Tobacco Use: History  Smoking Status  . Former Smoker  . Packs/day: 1.00  . Years: 5.00  . Types: Cigarettes  . Quit date: 12/14/1968   Smokeless Tobacco  . Never Used    Labs: Recent Review Flowsheet Data    Labs for ITP Cardiac and Pulmonary Rehab Latest Ref Rng & Units 03/01/2016   Cholestrol 0 - 200 mg/dL 409   LDLCALC 0 - 99 mg/dL 93   HDL >81 mg/dL 55   Trlycerides <191 mg/dL 85   Hemoglobin Y7W 4.8 - 5.6 % 5.3      Capillary Blood Glucose: No results found for: GLUCAP   Exercise Target Goals:    Exercise Program Goal: Individual exercise prescription set with THRR, safety & activity barriers. Participant demonstrates ability to understand and report RPE using BORG scale, to self-measure pulse accurately, and to acknowledge the importance of the exercise prescription.  Exercise Prescription Goal: Starting with aerobic activity 30 plus minutes a day, 3 days per week for initial exercise prescription. Provide home exercise prescription and guidelines that participant acknowledges understanding prior to discharge.  Activity Barriers & Risk Stratification:     Activity Barriers & Cardiac Risk Stratification - 04/07/16 0900      Activity Barriers & Cardiac Risk Stratification   Activity Barriers None   Cardiac Risk Stratification High      6 Minute Walk:     6 Minute Walk    Row Name 04/07/16 1636 07/28/16 1011       6 Minute Walk   Phase Initial Discharge    Distance 1550 feet 1800 feet    Distance % Change  - 16.13 %    Walk Time 6 minutes 6 minutes    # of Rest Breaks 0 0    MPH 2.93 3.4    METS 3.25 3.61    RPE 9 9    Perceived Dyspnea  9 9    VO2 Peak 14.1 14.65    Symptoms No No    Resting HR 57 bpm 67 bpm    Resting BP 134/82 108/68    Max Ex. HR 108 bpm 86 bpm    Max Ex. BP 146/92 144/78    2 Minute Post BP 110/72 128/70       Initial Exercise Prescription:     Initial Exercise Prescription - 04/07/16 0750      Date of Initial Exercise RX and Referring Provider   Date 04/07/16   Referring Provider Dr. Rennis Golden     Treadmill   MPH 2   Grade 0   Minutes 15   METs 2.5      NuStep   Level 2   Minutes 15   METs 2     Elliptical   Level 1   Speed 1   Minutes 15   METs 1.5     Prescription Details   Frequency (times per  week) 3   Duration Progress to 30 minutes of continuous aerobic without signs/symptoms of physical distress     Intensity   THRR REST +  30   THRR 40-80% of Max Heartrate [phone]   Ratings of Perceived Exertion 11-13   Perceived Dyspnea 0-4     Progression   Progression Continue progressive overload as per policy without signs/symptoms or physical distress.     Resistance Training   Training Prescription Yes   Weight 1   Reps 10-12      Perform Capillary Blood Glucose checks as needed.  Exercise Prescription Changes:      Exercise Prescription Changes    Row Name 05/09/16 1100 05/20/16 1411 06/03/16 1500 06/10/16 1400 06/15/16 1200     Exercise Review   Progression Yes Yes  - No No     Response to Exercise   Blood Pressure (Admit) 112/82 96/52 124/80 120/70 120/70   Blood Pressure (Exercise) 148/92 112/62 182/100 150/100 150/100   Blood Pressure (Exit) 108/70 98/60 128/82 122/70 122/70   Heart Rate (Admit) 53 bpm 85 bpm 65 bpm 64 bpm 64 bpm   Heart Rate (Exercise) 84 bpm 100 bpm 96 bpm 87 bpm 87 bpm   Heart Rate (Exit) 56 bpm 81 bpm 73 bpm 73 bpm 73 bpm   Rating of Perceived Exertion (Exercise)  -  -  - 9 9   Symptoms No No no  Chest Pains.  Chest Pains.    Comments  -  -  - Patient has slowed down due to chest pains. On 06/10/16 patient stopped exercise due to chest pain and high blood pressure, 142/110. Patient has slowed down due to chest pains. On 06/10/16 patient stopped exercise due to chest pain and high blood pressure, 142/110.   Duration Progress to 30 minutes of continuous aerobic without signs/symptoms of physical distress Progress to 30 minutes of continuous aerobic without signs/symptoms of physical distress Progress to 30 minutes of continuous aerobic without signs/symptoms of physical distress  Progress to 30 minutes of continuous aerobic without signs/symptoms of physical distress Progress to 30 minutes of continuous aerobic without signs/symptoms of physical distress   Intensity Rest + 30 Rest + 30 Rest + 30 Rest + 30 Rest + 30     Progression   Progression Continue to progress workloads to maintain intensity without signs/symptoms of physical distress. Continue to progress workloads to maintain intensity without signs/symptoms of physical distress. Continue to progress workloads to maintain intensity without signs/symptoms of physical distress. Continue to progress workloads to maintain intensity without signs/symptoms of physical distress. Continue to progress workloads to maintain intensity without signs/symptoms of physical distress.     Resistance Training   Training Prescription Yes Yes Yes Yes Yes   Weight 5 5 5 5 5    Reps 10-12 10-12 10-12 10-12 10-12     Interval Training   Interval Training No No No No No     Treadmill   MPH 3 2.2 3.2 3.1 3.1   Grade 0 0 0 0 0   Minutes 15 15 15 20 20    METs 3.29 2.5 3.4 3.3 3.3     NuStep   Level  - 3  -  -  -   Minutes  - 15  -  -  -   METs  - 1.9  -  -  -     Elliptical   Level 5 5 6 3 3    Speed 80 80 124 108 108  Minutes 15 15 15 15 15    METs 6.8 6.8 6.8 6 6      Home Exercise Plan   Plans to continue exercise at Home Home Home Home Home   Frequency Add 2 additional days to program exercise sessions. Add 2 additional days to program exercise sessions. Add 2 additional days to program exercise sessions. Add 2 additional days to program exercise sessions. Add 2 additional days to program exercise sessions.   Row Name 06/29/16 1200 07/08/16 1400 07/21/16 1300         Exercise Review   Progression Yes Yes Yes       Response to Exercise   Blood Pressure (Admit) 128/80 120/82 116/62     Blood Pressure (Exercise) 150/92 160/88 150/92     Blood Pressure (Exit) 122/80 140/82 124/76     Heart Rate (Admit) 74 bpm 72 bpm 68  bpm     Heart Rate (Exercise) 91 bpm 94 bpm 91 bpm     Heart Rate (Exit) 83 bpm 75 bpm 77 bpm     Rating of Perceived Exertion (Exercise) 11 11 12      Symptoms No No No     Duration Progress to 30 minutes of continuous aerobic without signs/symptoms of physical distress Progress to 30 minutes of continuous aerobic without signs/symptoms of physical distress Progress to 30 minutes of continuous aerobic without signs/symptoms of physical distress     Intensity Rest + 30 Rest + 30 Rest + 30       Progression   Progression Continue to progress workloads to maintain intensity without signs/symptoms of physical distress. Continue to progress workloads to maintain intensity without signs/symptoms of physical distress. Continue to progress workloads to maintain intensity without signs/symptoms of physical distress.       Resistance Training   Training Prescription Yes Yes Yes     Weight 5 5 8      Reps 10-12 10-12 10-12       Interval Training   Interval Training No No No       Treadmill   MPH 3.1 3.4 3.4     Grade 0 0 0.5     Minutes 20 20 20      METs 3.4 3.6 3.8       Elliptical   Level 3 4 4      Speed 112 116 116     Minutes 15 15 15      METs 6 5.8 6.5       Home Exercise Plan   Plans to continue exercise at Home Home Home     Frequency Add 2 additional days to program exercise sessions. Add 2 additional days to program exercise sessions. Add 2 additional days to program exercise sessions.        Exercise Comments:      Exercise Comments    Row Name 05/09/16 1200 07/08/16 1428 07/21/16 1306       Exercise Comments Patient is progressing appropriately.  Patient doing well with proram. Will continue to monitor for progress. Patient is progressing appropriately         Discharge Exercise Prescription (Final Exercise Prescription Changes):     Exercise Prescription Changes - 07/21/16 1300      Exercise Review   Progression Yes     Response to Exercise   Blood Pressure  (Admit) 116/62   Blood Pressure (Exercise) 150/92   Blood Pressure (Exit) 124/76   Heart Rate (Admit) 68 bpm   Heart Rate (Exercise) 91 bpm  Heart Rate (Exit) 77 bpm   Rating of Perceived Exertion (Exercise) 12   Symptoms No   Duration Progress to 30 minutes of continuous aerobic without signs/symptoms of physical distress   Intensity Rest + 30     Progression   Progression Continue to progress workloads to maintain intensity without signs/symptoms of physical distress.     Resistance Training   Training Prescription Yes   Weight 8   Reps 10-12     Interval Training   Interval Training No     Treadmill   MPH 3.4   Grade 0.5   Minutes 20   METs 3.8     Elliptical   Level 4   Speed 116   Minutes 15   METs 6.5     Home Exercise Plan   Plans to continue exercise at Home   Frequency Add 2 additional days to program exercise sessions.      Nutrition:  Target Goals: Understanding of nutrition guidelines, daily intake of sodium 1500mg , cholesterol 200mg , calories 30% from fat and 7% or less from saturated fats, daily to have 5 or more servings of fruits and vegetables.  Biometrics:     Pre Biometrics - 04/09/16 1648      Pre Biometrics   Height 6\' 3"  (1.905 m)   Weight 206 lb 11.2 oz (93.8 kg)   Waist Circumference 38.5 inches   Hip Circumference 40.5 inches   Waist to Hip Ratio 0.95 %   BMI (Calculated) 25.9   Triceps Skinfold 4 mm   % Body Fat 20.2 %   Grip Strength 84.3 kg   Flexibility 10.7 in   Single Leg Stand 60 seconds         Post Biometrics - 07/28/16 1013       Post  Biometrics   Height 6\' 1"  (1.854 m)   Weight 197 lb 5 oz (89.5 kg)   Waist Circumference 35 inches   Hip Circumference 40 inches   Waist to Hip Ratio 0.88 %   BMI (Calculated) 26.1   Triceps Skinfold 4 mm   % Body Fat 18.8 %   Grip Strength 100.3 kg   Flexibility 12.25 in   Single Leg Stand 51 seconds      Nutrition Therapy Plan and Nutrition Goals:     Nutrition  Therapy & Goals - 04/07/16 0904      Intervention Plan   Intervention Nutrition handout(s) given to patient.   Expected Outcomes Short Term Goal: Understand basic principles of dietary content, such as calories, fat, sodium, cholesterol and nutrients.      Nutrition Discharge: Rate Your Plate Scores:     Nutrition Assessments - 04/07/16 0905      MEDFICTS Scores   Pre Score 3      Nutrition Goals Re-Evaluation:   Psychosocial: Target Goals: Acknowledge presence or absence of depression, maximize coping skills, provide positive support system. Participant is able to verbalize types and ability to use techniques and skills needed for reducing stress and depression.  Initial Review & Psychosocial Screening:     Initial Psych Review & Screening - 04/07/16 1624      Family Dynamics   Good Support System? Yes     Barriers   Psychosocial barriers to participate in program There are no identifiable barriers or psychosocial needs.     Screening Interventions   Interventions Encouraged to exercise      Quality of Life Scores:     Quality of Life - 07/28/16  1017      Quality of Life Scores   Health/Function Pre 22.4 %   Health/Function Post 25.77 %   Health/Function % Change 15.04 %   Socioeconomic Pre 29.25 %   Socioeconomic Post 30 %   Socioeconomic % Change  2.56 %   Psych/Spiritual Pre 24 %   Psych/Spiritual Post 30 %   Psych/Spiritual % Change 25 %   Family Pre 25.2 %   Family Post 25.2 %   Family % Change 0 %   GLOBAL Pre 24.69 %   GLOBAL Post 27.43 %   GLOBAL % Change 11.1 %      PHQ-9: Recent Review Flowsheet Data    Depression screen [facility] 2/9 04/07/2016   Decreased Interest 0   Down, Depressed, Hopeless 0   PHQ - 2 Score 0   Altered sleeping 0   Tired, decreased energy 0   Change in appetite 0   Feeling bad or failure about yourself  0   Trouble concentrating 0   Moving slowly or fidgety/restless 0   Suicidal thoughts 0   PHQ-9 Score 0       Psychosocial Evaluation and Intervention:     Psychosocial Evaluation - 04/07/16 1624      Psychosocial Evaluation & Interventions   Interventions Stress management education;Relaxation education;Encouraged to exercise with the program and follow exercise prescription   Continued Psychosocial Services Needed No      Psychosocial Re-Evaluation:     Psychosocial Re-Evaluation    Row Name 04/10/16 1434 05/04/16 1419 06/08/16 1149 07/08/16 1430 07/30/16 1329     Psychosocial Re-Evaluation   Interventions Encouraged to attend Cardiac Rehabilitation for the exercise Encouraged to attend Cardiac Rehabilitation for the exercise Relaxation education;Encouraged to attend Cardiac Rehabilitation for the exercise;Stress management education Encouraged to attend Cardiac Rehabilitation for the exercise -  Encourage patient to continue to exercise at home and to play golf.    Comments  -  -  - Patient is not depressed. He does not need counseling.  Patient exit QOL score was 27.43, a 11.1 increase from when he started the program. His psychosocial scores improved greatly with coming to our program.    Continued Psychosocial Services Needed No No No No No      Vocational Rehabilitation: Provide vocational rehab assistance to qualifying candidates.   Vocational Rehab Evaluation & Intervention:     Vocational Rehab - 04/07/16 0901      Initial Vocational Rehab Evaluation & Intervention   Assessment shows need for Vocational Rehabilitation No      Education: Education Goals: Education classes will be provided on a weekly basis, covering required topics. Participant will state understanding/return demonstration of topics presented.  Learning Barriers/Preferences:     Learning Barriers/Preferences - 04/07/16 0900      Learning Barriers/Preferences   Learning Barriers None   Learning Preferences Skilled Demonstration      Education Topics: Hypertension, Hypertension  Reduction -Define heart disease and high blood pressure. Discus how high blood pressure affects the body and ways to reduce high blood pressure. Flowsheet Row CARDIAC REHAB PHASE II EXERCISE from 07/22/2016 in [place] [facility]  Date  04/15/16  Educator  Hart Rochester  Instruction Review Code  2- meets goals/outcomes      Exercise and Your Heart -Discuss why it is important to exercise, the FITT principles of exercise, normal and abnormal responses to exercise, and how to exercise safely. Flowsheet Row CARDIAC REHAB PHASE II EXERCISE from 07/22/2016 in [place]  [facility]  Date  04/22/16  Educator  Hart Rochester  Instruction Review Code  2- meets goals/outcomes      Angina -Discuss definition of angina, causes of angina, treatment of angina, and how to decrease risk of having angina. Flowsheet Row CARDIAC REHAB PHASE II EXERCISE from 07/22/2016 in [place] [facility]  Date  04/29/16  Educator  -- [D Ajanay Farve]  Instruction Review Code  2- meets goals/outcomes      Cardiac Medications -Review what the following cardiac medications are used for, how they affect the body, and side effects that may occur when taking the medications.  Medications include Aspirin, Beta blockers, calcium channel blockers, ACE Inhibitors, angiotensin receptor blockers, diuretics, digoxin, and antihyperlipidemics. Flowsheet Row CARDIAC REHAB PHASE II EXERCISE from 07/22/2016 in [place] [facility]  Date  05/06/16  Educator  D Zymiere Trostle  Instruction Review Code  2- meets goals/outcomes      Congestive Heart Failure -Discuss the definition of CHF, how to live with CHF, the signs and symptoms of CHF, and how keep track of weight and sodium intake.   Heart Disease and Intimacy -Discus the effect sexual activity has on the heart, how changes occur during intimacy as we age, and safety during sexual activity.   Smoking Cessation / COPD -Discuss different methods to quit  smoking, the health benefits of quitting smoking, and the definition of COPD.   Nutrition I: Fats -Discuss the types of cholesterol, what cholesterol does to the heart, and how cholesterol levels can be controlled. Flowsheet Row CARDIAC REHAB PHASE II EXERCISE from 07/22/2016 in [place] [facility]  Date  06/03/16  Educator  Hart Rochester  Instruction Review Code  2- meets goals/outcomes      Nutrition II: Labels -Discuss the different components of food labels and how to read food label Flowsheet Row CARDIAC REHAB PHASE II EXERCISE from 07/22/2016 in [place] [facility]  Date  06/10/16  Educator  Hart Rochester  Instruction Review Code  2- meets goals/outcomes      Heart Parts and Heart Disease -Discuss the anatomy of the heart, the pathway of blood circulation through the heart, and these are affected by heart disease.   Stress I: Signs and Symptoms -Discuss the causes of stress, how stress may lead to anxiety and depression, and ways to limit stress. Flowsheet Row CARDIAC REHAB PHASE II EXERCISE from 07/22/2016 in [place] [facility]  Date  06/24/16  Educator  DJ  Instruction Review Code  2- meets goals/outcomes      Stress II: Relaxation -Discuss different types of relaxation techniques to limit stress. Flowsheet Row CARDIAC REHAB PHASE II EXERCISE from 07/22/2016 in [place] [facility]  Date  07/01/16  Educator  DC/DJ  Instruction Review Code  2- meets goals/outcomes      Warning Signs of Stroke / TIA -Discuss definition of a stroke, what the signs and symptoms are of a stroke, and how to identify when someone is having stroke. Flowsheet Row CARDIAC REHAB PHASE II EXERCISE from 07/22/2016 in [place] [facility]  Date  07/08/16  Educator  DJ/DC  Instruction Review Code  2- meets goals/outcomes      Knowledge Questionnaire Score:     Knowledge Questionnaire Score - 04/07/16 0901      Knowledge  Questionnaire Score   Pre Score 23/24      Core Components/Risk Factors/Patient Goals at Admission:     Personal Goals and Risk Factors at Admission - 04/07/16  1610      Core Components/Risk Factors/Patient Goals on Admission    Weight Management Weight Maintenance   Increase Strength and Stamina Yes   Intervention Provide advice, education, support and counseling about physical activity/exercise needs.   Expected Outcomes Achievement of increased cardiorespiratory fitness and enhanced flexibility, muscular endurance and strength shown through measurements of functional capacity and personal statement of participant.   Hypertension Yes   Intervention Provide education on lifestyle modifcations including regular physical activity/exercise, weight management, moderate sodium restriction and increased consumption of fresh fruit, vegetables, and low fat dairy, alcohol moderation, and smoking cessation.;Monitor prescription use compliance.   Expected Outcomes Long Term: Maintenance of blood pressure at goal levels.   Lipids Yes   Intervention Provide education and support for participant on nutrition & aerobic/resistive exercise along with prescribed medications to achieve LDL 70mg , HDL >40mg .   Expected Outcomes Long Term: Cholesterol controlled with medications as prescribed, with individualized exercise RX and with personalized nutrition plan. Value goals: LDL < 70mg , HDL > 40 mg.      Core Components/Risk Factors/Patient Goals Review:      Goals and Risk Factor Review    Row Name 04/07/16 1624 04/10/16 1433 05/04/16 1417 06/08/16 1148 07/08/16 1428     Core Components/Risk Factors/Patient Goals Review   Personal Goals Review Increase Strength and Stamina;Hypertension;Lipids  - Increase Strength and Stamina;Hypertension;Lipids Increase Strength and Stamina;Hypertension;Lipids Increase Strength and Stamina   Review  - no progress. patient will start program 04/13/16 Patient is progressing  will in the program. BP's have been WNL so far.   Pt has had episodes of CP in past 30 days with visits to cardiologist.  Has had some High BPs. Meds have been adjusted. Pt continues to be determined. support and encouragement given.  Patient is getting stronger and has increased his activities. Patient has returned to playing golf.    Expected Outcomes  -  - increase strength, Controlled HTN, Lipids increase strength, Controlled HTN, Lipids Continue to get stronger and increase activity.    Row Name 07/30/16 1327             Core Components/Risk Factors/Patient Goals Review   Personal Goals Review Increase Strength and Stamina       Review Patient has done well in program. has gotten stronger. He has increased his activities. Patient has returned to playing golf.        Expected Outcomes To continue to exercise and play golf. Patient graduated on 07/27/16 at 36 sessions.           Core Components/Risk Factors/Patient Goals at Discharge (Final Review):      Goals and Risk Factor Review - 07/30/16 1327      Core Components/Risk Factors/Patient Goals Review   Personal Goals Review Increase Strength and Stamina   Review Patient has done well in program. has gotten stronger. He has increased his activities. Patient has returned to playing golf.    Expected Outcomes To continue to exercise and play golf. Patient graduated on 07/27/16 at 36 sessions.       ITP Comments:     ITP Comments    Row Name 04/07/16 0901           ITP Comments patient is a 65 year old that lives with wife and enjoys playing golf play with old cars.  Patient shows no s/s of depression.           Comments: 30 day review. Patient did well in the program.  Patient graduated 07/27/16.

## 2016-07-31 NOTE — Progress Notes (Signed)
Discharge Summary  Patient Details  Name: Louis Perez MRN: [id number] Date of Birth: 11/05/1951 Referring Provider:   Flowsheet Row CARDIAC REHAB PHASE II ORIENTATION from 04/07/2016 in [facility]  Referring Provider  Dr. [name]       Number of Visits: 36  Reason for Discharge:  Patient reached a stable level of exercise. Patient independent in their exercise.  Smoking History:  History  Smoking Status  . Former Smoker  . Packs/day: 1.00  . Years: 5.00  . Types: Cigarettes  . Quit date: 12/14/1968  Smokeless Tobacco  . Never Used    Diagnosis:  NSTEMI (non-ST elevation myocardial infarction) (HCC)  ADL [facility]SD:   Initial Exercise Prescription:     Initial Exercise Prescription - 04/07/16 0750      Date of Initial Exercise RX and Referring Provider   Date 04/07/16   Referring Provider Dr. [name]     Treadmill   MPH 2   Grade 0   Minutes 15   METs 2.5     NuStep   Level 2   Minutes 15   METs 2     Elliptical   Level 1   Speed 1   Minutes 15   METs 1.5     Prescription Details   Frequency (times per week) 3   Duration Progress to 30 minutes of continuous aerobic without signs/symptoms of physical distress     Intensity   THRR REST +  30   THRR 40-80% of Max Heartrate 96-116-135   Ratings of Perceived Exertion 11-13   Perceived Dyspnea 0-4     Progression   Progression Continue progressive overload as per policy without signs/symptoms or physical distress.     Resistance Training   Training Prescription Yes   Weight 1   Reps 10-12      Discharge Exercise Prescription (Final Exercise Prescription Changes):     Exercise Prescription Changes - 07/21/16 1300      Exercise Review   Progression Yes     Response to Exercise   Blood Pressure (Admit) 116/62   Blood Pressure (Exercise) 150/92   Blood Pressure (Exit) 124/76   Heart Rate (Admit) 68 bpm   Heart Rate (Exercise) 91 bpm   Heart Rate (Exit) 77 bpm   Rating of Perceived Exertion (Exercise) 12   Symptoms No   Duration Progress to 30 minutes of continuous aerobic without signs/symptoms of physical distress   Intensity Rest + 30     Progression   Progression Continue to progress workloads to maintain intensity without signs/symptoms of physical distress.     Resistance Training   Training Prescription Yes   Weight 8   Reps 10-12     Interval Training   Interval Training No     Treadmill   MPH 3.4   Grade 0.5   Minutes 20   METs 3.8     Elliptical   Level 4   Speed 116   Minutes 15   METs 6.5     Home Exercise Plan   Plans to continue exercise at Home   Frequency Add 2 additional days to program exercise sessions.      Functional Capacity:     6 Minute Walk    Row Name 04/07/16 1636 07/28/16 1011       6 Minute Walk   Phase Initial Discharge    Distance 1550 feet 1800 feet    Distance % Change  - 16.13 %      Walk Time 6 minutes 6 minutes    # of Rest Breaks 0 0    MPH 2.93 3.4    METS 3.25 3.61    RPE 9 9    Perceived Dyspnea  9 9    VO2 Peak 14.1 14.65    Symptoms No No    Resting HR 57 bpm 67 bpm    Resting BP 134/82 108/68    Max Ex. HR 108 bpm 86 bpm    Max Ex. BP 146/92 144/78    2 Minute Post BP 110/72 128/70       Psychological, QOL, Others - Outcomes: PHQ 2/9: Depression screen [facility] 2/9 07/27/2016 04/07/2016  Decreased Interest 0 0  Down, Depressed, Hopeless 0 0  PHQ - 2 Score 0 0  Altered sleeping 3 0  Tired, decreased energy 0 0  Change in appetite 0 0  Feeling bad or failure about yourself  0 0  Trouble concentrating 0 0  Moving slowly or fidgety/restless 0 0  Suicidal thoughts 0 0  PHQ-9 Score 3 0    Quality of Life:     Quality of Life - 07/28/16 1017      Quality of Life Scores   Health/Function Pre 22.4 %   Health/Function Post 25.77 %   Health/Function % Change 15.04 %   Socioeconomic Pre 29.25 %   Socioeconomic Post 30 %   Socioeconomic % Change  2.56 %    Psych/Spiritual Pre 24 %   Psych/Spiritual Post 30 %   Psych/Spiritual % Change 25 %   Family Pre 25.2 %   Family Post 25.2 %   Family % Change 0 %   GLOBAL Pre 24.69 %   GLOBAL Post 27.43 %   GLOBAL % Change 11.1 %      Personal Goals: Goals established at orientation with interventions provided to work toward goal.     Personal Goals and Risk Factors at Admission - 04/07/16 0905      Core Components/Risk Factors/Patient Goals on Admission    Weight Management Weight Maintenance   Increase Strength and Stamina Yes   Intervention Provide advice, education, support and counseling about physical activity/exercise needs.   Expected Outcomes Achievement of increased cardiorespiratory fitness and enhanced flexibility, muscular endurance and strength shown through measurements of functional capacity and personal statement of participant.   Hypertension Yes   Intervention Provide education on lifestyle modifcations including regular physical activity/exercise, weight management, moderate sodium restriction and increased consumption of fresh fruit, vegetables, and low fat dairy, alcohol moderation, and smoking cessation.;Monitor prescription use compliance.   Expected Outcomes Long Term: Maintenance of blood pressure at goal levels.   Lipids Yes   Intervention Provide education and support for participant on nutrition & aerobic/resistive exercise along with prescribed medications to achieve LDL <58m, HDL >46m   Expected Outcomes Long Term: Cholesterol controlled with medications as prescribed, with individualized exercise RX and with personalized nutrition plan. Value goals: LDL < 7051mHDL > 40 mg.       Personal Goals Discharge:     Goals and Risk Factor Review    Row Name 04/07/16 1624 04/10/16 1433 05/04/16 1417 06/08/16 1148 07/08/16 1428     Core Components/Risk Factors/Patient Goals Review   Personal Goals Review Increase Strength and Stamina;Hypertension;Lipids  - Increase  Strength and Stamina;Hypertension;Lipids Increase Strength and Stamina;Hypertension;Lipids Increase Strength and Stamina   Review  - no progress. patient will start program 04/13/16 Patient is progressing will in the program. BP's have  been WNL so far.   Pt has had episodes of CP in past 30 days with visits to cardiologist.  Has had some High BPs. Meds have been adjusted. Pt continues to be determined. support and encouragement given.  Patient is getting stronger and has increased his activities. Patient has returned to playing golf.    Expected Outcomes  -  - increase strength, Controlled HTN, Lipids increase strength, Controlled HTN, Lipids Continue to get stronger and increase activity.    Row Name 07/27/16 1329 07/30/16 1327           Core Components/Risk Factors/Patient Goals Review   Personal Goals Review Increase Strength and Stamina;Other  Increase acitivity.  Increase Strength and Stamina      Review Upon graduation, patient lost 8.7 lbs. His b/p was well WNL at graduation and he met his goal of increased strenght and stamina. His activity did increase and he was able to get back to golfing.  Patient has done well in program. has gotten stronger. He has increased his activities. Patient has returned to playing golf.       Expected Outcomes Patient will continue to exercise and eat a heart healthy diet to continue to met and maintain the above stated goals.  To continue to exercise and play golf. Patient graduated on 07/27/16 at 36 sessions.          Nutrition & Weight - Outcomes:     Pre Biometrics - 04/09/16 1648      Pre Biometrics   Height 6' 3" (1.905 m)   Weight 206 lb 11.2 oz (93.8 kg)   Waist Circumference 38.5 inches   Hip Circumference 40.5 inches   Waist to Hip Ratio 0.95 %   BMI (Calculated) 25.9   Triceps Skinfold 4 mm   % Body Fat 20.2 %   Grip Strength 84.3 kg   Flexibility 10.7 in   Single Leg Stand 60 seconds         Post Biometrics - 07/28/16 1013        Post  Biometrics   Height 6' 1" (1.854 m)   Weight 197 lb 5 oz (89.5 kg)   Waist Circumference 35 inches   Hip Circumference 40 inches   Waist to Hip Ratio 0.88 %   BMI (Calculated) 26.1   Triceps Skinfold 4 mm   % Body Fat 18.8 %   Grip Strength 100.3 kg   Flexibility 12.25 in   Single Leg Stand 51 seconds      Nutrition:     Nutrition Therapy & Goals - 04/07/16 0904      Intervention Plan   Intervention Nutrition handout(s) given to patient.   Expected Outcomes Short Term Goal: Understand basic principles of dietary content, such as calories, fat, sodium, cholesterol and nutrients.      Nutrition Discharge:     Nutrition Assessments - 07/31/16 1328      MEDFICTS Scores   Pre Score 3   Post Score 21   Score Difference 18      Education Questionnaire Score:     Knowledge Questionnaire Score - 07/31/16 1328      Knowledge Questionnaire Score   Pre Score 23/24   Post Score 24/24      Goals reviewed with patient; copy given to patient. 

## 2016-08-07 ENCOUNTER — Encounter: Payer: Self-pay | Admitting: Internal Medicine

## 2016-08-07 ENCOUNTER — Ambulatory Visit (INDEPENDENT_AMBULATORY_CARE_PROVIDER_SITE_OTHER): Payer: Medicare Other | Admitting: Internal Medicine

## 2016-08-07 DIAGNOSIS — J4541 Moderate persistent asthma with (acute) exacerbation: Secondary | ICD-10-CM

## 2016-08-07 DIAGNOSIS — I200 Unstable angina: Secondary | ICD-10-CM

## 2016-08-07 LAB — NITRIC OXIDE: NITRIC OXIDE: 19

## 2016-08-07 MED ORDER — BUDESONIDE-FORMOTEROL FUMARATE 160-4.5 MCG/ACT IN AERO: 2.0000 | INHALATION_SPRAY | Freq: Two times a day (BID) | RESPIRATORY_TRACT | 0 refills | Status: DC

## 2016-08-07 MED ORDER — BUDESONIDE-FORMOTEROL FUMARATE 160-4.5 MCG/ACT IN AERO
INHALATION_SPRAY | RESPIRATORY_TRACT | 11 refills | Status: DC
Start: 2016-08-07 — End: 2016-11-02

## 2016-08-07 MED ORDER — MONTELUKAST SODIUM 10 MG PO TABS: 10.0000 mg | ORAL_TABLET | Freq: Every day | ORAL | 11 refills | Status: DC

## 2016-08-07 NOTE — Progress Notes (Signed)
Subjective:    Patient ID: Louis Perez, male    DOB: May 09, 1951  MRN: [id number]    Brief patient profile:  36 yowm smoked only as teenager with chronic nasal congestion x decades Perez in [place] s/p sinus surgery 2013 then began having cough/ wheeze referred 06/02/2013 to pulmonary clinic by Dr Rosezetta Schlatter.   HPI 06/02/2013 1st pulmonary eval cc cough starting "shortly after" sinus surgery then breathing problems esp last 6 months assoc with change in voice. Onset of cough and sob was gradual, progressively Perez with sense of chest congestion and production of thick green mucus but no vomiting. rec augmentin 875 twice daily x 10 days Prednisone 10 mg take  4 each am x 2 days,   2 each am x 2 days,  1 each am x 2 days and stop  Dulera 200 Take 2 puffs first thing in am and then another 2 puffs about 12 hours later.  Only use your albuterol(ventolin) as a rescue medication  - don't leave home without the rescue inhaler Pantoprazole (protonix) 40 mg   Take 30-60 min before first meal of the day and Pepcid 20 mg one bedtime until return to office - this is the best way to tell whether stomach acid is contributing to your problem.     06/30/2013 f/u ov/Louis Perez re cough Chief Complaint  Patient presents with  . Follow-up    Breathing doing much better, no co's today.  no need at all for saba, not limited by breathing.  Sensation of something stuck in throat comes and goes ever since sinus surgery and Perez lately so then went back on protonix rec Stop protonix and just try the rantidine 150 after bfast and be careful with your diet  Reduce dulera to 100  Take 2 puffs first thing in am and then another 2 puffs about 12 hours later   08/16/13 f/u ov / Louis Perez  Chief Complaint  Patient presents with  . Follow-up    Pt states that breathing is doing well. He c/o cough x 2 wks- prod with minimal yellow sputum.   Persistent sense of throat tickle, no need for sab, not disturbing sleep exac in  ams,some Perez off ppi rec Pantoprazole (protonix) 40 mg   Take 30-60 min before first meal of the day and Pepcid 20 mg one bedtime until return to office - this is the best way to tell whether stomach acid is contributing to your problem.   Stop dulera 100  to see if your cough or breathing worsen> restart it  GERD rx   09/20/2013 f/u ov/Louis Perez re: recurrent cough x 2 weeks on ppi/h2hs  Chief Complaint  Patient presents with  . Acute Visit    Reports increased DOE, wheezing, coughing. Onset was 1 week ago. Feels as if this may be related to his allergies.  did not restart the dulera immediately but sounds like he was compliant with acid, with esp noct wheeze assoc with nasal congestion typical of this time of year rec Continue dulera 100 Take 2 puffs first thing in am and then another 2 puffs about 12 hours later and out through nose  Also continue the acid suppression  Only use your albuterol   Prednisone 10 mg take  4 each am x 2 days,   2 each am x 2 days,  1 each am x 2 days and stop  Augmentin 875 twice daily x 10 days  Please schedule a follow up office visit in 4  weeks, sooner if needed bring all active medication   10/18/2013 f/u ov/Louis Perez re: asthma/ cough did not bring meds  Chief Complaint  Patient presents with  . Follow-up    Pt states that his cough, dyspnea and wheezing are slightly improved since last visit. He still notices wheezing at night. Cough is prod with moderate yellow to green sputum.    not feeling the need to use cough suppression at this point, rare need for saba but felt the best when on dulera 200 2bid  rec dulera 200 mcg Take 2 puffs first thing in am and then another 2 puffs about 12 hours later.  Only use your albuterol as a rescue medication Try taper off the am acid protonix(pantaprazole) to see if any daytime symptoms flare but continue the bedtime pepcid until return See Dr Louis Perez re your green post-nasal drainage> did not do   08/22/2015 acute   ov/Louis Perez re: poorly controlled asthma  Chief Complaint  Patient presents with  . Acute Visit    Pt c/o wheezing, esp at night for the past 3 months. He also has had increased SOB and prod cough with yellow to green sputum.     confused re dose of dulera actually just on one bid dose, much Perez x 3 month assoc with nasal congestion and doe as well as increase saba need   rec Dulera 200 mcg Take 2 puffs first thing in am and then another 2 puffs about 12 hours later.  Only use your albuterol as a rescue medication as needed  Try taper off the am acid protonix(pantaprazole) to see if any daytime symptoms flare but continue the bedtime pepcid until return Augmentin 875 mg take one pill twice daily  X 10 days - take at breakfast and supper with large glass of water.   Prednisone 10 mg take  4 each am x 2 days,   2 each am x 2 days,  1 each am x 2 days and stop      Acute 07/07/16 Louis Perez breathing x 1-2 weeks while on dulera 200  Nose always a little stuffy/ some am cough  rec  Solu-Medrol 80 mg IM now Prednisone dosepak - 60 mg - take as directed Call if no better by Friday [facility]     08/07/2016  f/u ov/Louis Perez re: s/p asthma flare/ poorly controlled rhintis Chief Complaint  Patient presents with  . Follow-up    Breathing has returned to baseline. He seldom uses albuterol.   did not know max rx for albuterol but otherwise self managing fine  Still some am cough  Attributed to pnds/ occ green am mucus/ f/u ent [place]      No obvious daytime variabilty or assoc cp or chest tightness,  overt  hb symptoms. No unusual exp hx or h/o childhood pna/ asthma or knowledge of premature birth.    Also denies any obvious fluctuation of symptoms with weather or environmental changes or other aggravating or alleviating factors except as outlined above    Current Medications, Allergies, Past Medical History, Past Surgical History, Family  History, and Social History were reviewed [facility] electronic [facility] record.  ROS  The following are not active complaints unless bolded sore throat, dysphagia, dental problems, itching, sneezing,  nasal congestion or excess/ purulent secretions, ear ache,   fever, chills, sweats, unintended wt loss, pleuritic or exertional cp, hemoptysis,  orthopnea pnd or leg swelling, presyncope, palpitations, heartburn,  abdominal pain, anorexia, nausea, vomiting, diarrhea  or change in bowel or urinary habits, change in stools or urine, dysuria,hematuria,  rash, arthralgias, visual complaints, headache, numbness weakness or ataxia or problems with walking or coordination,  change in mood/affect or memory.             Objective:   Physical Exam  Amb   wm nad   09/20/2013       219 > 217 10/18/2013  > 08/22/2015  221 > 08/07/2016  203     08/16/13 219 lb (99.338 kg)  07/07/13 215 lb (97.523 kg)  06/30/13 217 lb (98.431 kg)     HEENT: nl dentition, turbinates, and oropharynx   Nl external ear canals without cough reflex   NECK :  without JVD/Nodes/TM/ nl carotid upstrokes bilaterally   LUNGS: no acc muscle use, clear to A and P   CV:  RRR  no s3 or murmur or increase in P2, no edema   ABD:  soft and nontender with nl excursion in the supine position. No bruits or organomegaly, bowel sounds nl  MS:  warm without deformities, calf tenderness, cyanosis or clubbing          Assessment & Plan:

## 2016-08-07 NOTE — Assessment & Plan Note (Addendum)
-  allergy profile 09/20/2013 > 7% eos >>> IgE 31.2 and no resp allergens identified - 08/07/2016 p extensive coaching HFA effectiveness =   90%  - Spirometry 08/07/2016  FEV1 3.0 (73%)  Ratio 80 p am dulera 200 - FENO 08/07/2016  =   19 on dulera 200 2bid but persistent nasal congestion/ pnd - added singulair 10 mg q pm 08/07/2016 >>>   I had an extended final summary discussion with the patient reviewing all relevant studies completed to date and  lasting 15 to 20 minutes of a 25 minute visit on the following issues:     1) All goals of chronic asthma control met including optimal function and elimination of symptoms with minimal need for rescue therapy.  Contingencies discussed in full including contacting this office immediately if not controlling the symptoms using the rule of two's.     2) Formulary restrictions will be an ongoing challenge for the forseable future and I would be happy to pick an alternative if the pt will first  provide me a list of them but pt  will need to return here for training for any new device that is required eg dpi vs hfa vs respimat.    In meantime we can always provide samples so the patient never runs out of any needed respiratory medications.   3) trial of singulair x one month only and stop if nasal symptoms not better   4) Each maintenance medication was reviewed in detail including most importantly the difference between maintenance and as needed and under what circumstances the prns are to be used.  Please see instructions for details which were reviewed in writing and the patient given a copy.   5) pulmonary f/u is as needed

## 2016-08-07 NOTE — Patient Instructions (Addendum)
Plan A = Automatic = Symbicort 160/dulera 200 Take 2 puffs first thing in am and then another 2 puffs about 12 hours later.   Also add singulair 10 mg each pm x one month trial to see if helps nasal symptoms and if not stop it    Plan B = Backup Only use your albuterol (proair) as a rescue medication to be used if you can't catch your breath by resting or doing a relaxed purse lip breathing pattern.  - The less you use it, the better it will work when you need it. - Ok to use the inhaler up to 2 puffs  every 4 hours if you must but call for appointment if use goes up over your usual need - Don't leave home without it !!  (think of it like the spare tire for your car)   Pulmonary follow up is as needed with formulary in hand

## 2016-11-02 ENCOUNTER — Telehealth: Payer: Self-pay | Admitting: Internal Medicine

## 2016-11-02 ENCOUNTER — Ambulatory Visit (INDEPENDENT_AMBULATORY_CARE_PROVIDER_SITE_OTHER): Payer: Medicare Other | Admitting: Physician Assistant

## 2016-11-02 ENCOUNTER — Encounter: Payer: Self-pay | Admitting: Physician Assistant

## 2016-11-02 VITALS — BP 117/77 | HR 71 | Ht 75.0 in | Wt 204.6 lb

## 2016-11-02 DIAGNOSIS — R079 Chest pain, unspecified: Secondary | ICD-10-CM

## 2016-11-02 DIAGNOSIS — I200 Unstable angina: Secondary | ICD-10-CM

## 2016-11-02 DIAGNOSIS — I25118 Atherosclerotic heart disease of native coronary artery with other forms of angina pectoris: Secondary | ICD-10-CM | POA: Diagnosis not present

## 2016-11-02 DIAGNOSIS — I10 Essential (primary) hypertension: Secondary | ICD-10-CM

## 2016-11-02 DIAGNOSIS — E785 Hyperlipidemia, unspecified: Secondary | ICD-10-CM | POA: Diagnosis not present

## 2016-11-02 DIAGNOSIS — I209 Angina pectoris, unspecified: Secondary | ICD-10-CM

## 2016-11-02 MED ORDER — ISOSORBIDE MONONITRATE ER 60 MG PO TB24: 60.0000 mg | ORAL_TABLET | Freq: Two times a day (BID) | ORAL | 11 refills | Status: DC

## 2016-11-02 NOTE — Telephone Encounter (Signed)
Pt c/o of Chest Pain: 1. Are you having CP right now? no 2. Are you experiencing any other symptoms (ex. SOB, nausea, vomiting, sweating)? no 3. How long have you been experiencing CP? Few days 4. Is your CP continuous or coming and going? Comes and goes 5. Have you taken Nitroglycerin? No  [phone]

## 2016-11-02 NOTE — Progress Notes (Signed)
Cardiology Office Note    Date:  11/02/2016   ID:  Louis Perez, DOB 12/06/1951, MRN [id number]  PCP:  Delorse Lek, MD  Cardiologist:  Dr. Rennis Golden   Chief Complaint  Patient presents with  . Follow-up    seen for Dr. Rennis Golden, recurrent chest pain  . Dizziness    when getting up too fast.     History of Present Illness:  Louis Perez is a 65 y.o. male with PMH of asthma, hypertension, hyperlipidemia, Gilbert's disease, GERD, history of sinus bradycardia and CAD with recent and STEMI in March with drug-eluting stent placement in the LAD. Cardiac catheterization on 03/01/2016 showed 25% mid RCA, 30% distal RCA, 30% ostial RPDA lesion, 95% ostial LAD lesion treated with DES. Echocardiogram obtained on 03/01/2016 showed EF 60-65%, no regional wall motion abnormality. Post cath, he had discomfort and was referred back to the hospital for coronary evaluation, cardiac catheterization performed on 05/14/2016 showed 80% ramus stenosis which appears to be chilled by the very proximal stent strut extending into the left main file LAD ostium, was felt that intervention in this area increased risk that the dilated strut pain interfere with left circumflex takeoff, therefore patient was started on medical therapy. His isosorbide was increased. He was seen again in the office on 06/15/2016, he was still having exertional type chest discomfort. Eventually he was referred for another relook cardiac catheterization which was performed on 06/17/2016 which showed 75% ostial ramus lesion, 50% mid to distal LAD lesion, 30% ostial D2 lesion. The moderate severe stenosis of the ramus intermedius had FFR of 0.92 at baseline and 0.85 at peak hyperemia. Therefore medical therapy was recommended. He was last seen in the office on 07/23/2016, he has not had any further chest pain at the time.  He presents today for evaluation of recurrent chest discomfort. He says he he was doing well over the last few months, however  in the last week, he started noticing several episodes of right-sided chest pain. This is the same chest discomfort he felt with his previous angina, interestingly, this time, his symptom is not associated with exertion. He exercises in the gym 3 times a week up to 45 minutes each, he was able to exercise last Friday without any issue despite having some chest discomfort last Thursday. He also mentions the symptom occurring only a few seconds at a time. Given his multiple cardiac catheterization, I did not recommend a repeat cardiac catheterization at this time given the atypical nature of his symptom. I will however increase his antianginal medication, his Imdur will be 60 mg twice a day instead.   Past Medical History:  Diagnosis Date  . Arthritis    "hips, lower back, knees, shoulders, hands" (05/14/2016)  . Asthma dx'd in 2014  . Bacterial septicemia (HCC) 01/2011   "both knee involved"  . CAD (coronary artery disease)    a. NSTEMI 02/2016 s/p DES to LAD. b. Relook cath due to angina/prior high risk anatomy 05/14/16 -> continued medical therapy given concern that a stent strut could inferere with Cx itself.  . Coronary artery disease   . GERD (gastroesophageal reflux disease)   . Gilbert's disease   . Hyperlipidemia   . Hypertension   . 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 NSTEMI (non-ST elevated myocardial infarction) (HCC) 02/2016   [name] 03/02/2016  . Sinus bradycardia   . Unspecified tinnitus    right    Past Surgical History:  Procedure Laterality Date  .  BACK SURGERY    . CARDIAC CATHETERIZATION N/A 03/01/2016   Procedure: Left Heart Cath and Coronary Angiography;  Surgeon: Tonny Bollman, MD;  oLAD 95%, CFX/RI/RCA/RPDA 25-30%  . CARDIAC CATHETERIZATION N/A 03/01/2016   Procedure: Coronary Stent Intervention;  Surgeon: Tonny Bollman, MD;  Promus Premier 4.0 x  12 mm DES to oLAD  . CARDIAC CATHETERIZATION N/A 05/14/2016   Procedure: Left Heart Cath and Coronary Angiography;   Surgeon: Lennette Bihari, MD;  Location: [facility] INVASIVE CV LAB;  Service: Cardiovascular;  Laterality: N/A;  . CARDIAC CATHETERIZATION N/A 06/17/2016   Procedure: Intravascular Pressure Wire/FFR Study;  Surgeon: Tonny Bollman, MD;  Location: [facility] INVASIVE CV LAB;  Service: Cardiovascular;  Laterality: N/A;  . CARDIOVASCULAR STRESS TEST  05/26/2012   EKG negative for ischemia, no ECG changes, no significant ischemia noted  . I&D KNEE WITH POLY EXCHANGE Bilateral 01/2011   & complete synovectomy of 3 compartments of the right total knee/notes 02/04/2011 (replacement,partial knee replacement because of bacterial infection)  . JOINT REPLACEMENT    . KNEE ARTHROSCOPY Right ~ 2002  . LUMBAR DISC SURGERY  ~ 1980; 2000  . NASAL SINUS SURGERY  08/2011  . SHOULDER ARTHROSCOPY W/ ROTATOR CUFF REPAIR Right 01/2015  . TOTAL KNEE ARTHROPLASTY Bilateral 03/26/2004-09/15/2004    right-left  . TRANSTHORACIC ECHOCARDIOGRAM  05/26/2012   EF >55%, mild concentric LVH    Current Medications: Outpatient Medications Prior to Visit  Medication Sig Dispense Refill  . acetaminophen (TYLENOL) 500 MG tablet Take 500-1,000 mg by mouth 2 (two) times daily as needed for headache.     . albuterol (PROAIR HFA) 108 (90 BASE) MCG/ACT inhaler 2 puffs every 4 hours as needed only  if your can't catch your breath (Patient taking differently: Inhale 2 puffs into the lungs every 4 (four) hours as needed for shortness of breath. ) 1 Inhaler 1  . aspirin 81 MG tablet Take 81 mg by mouth at bedtime.     Marland Kitchen atorvastatin (LIPITOR) 80 MG tablet Take 1 tablet (80 mg total) by mouth daily at 6 PM. 30 tablet 30  . budesonide-formoterol (SYMBICORT) 160-4.5 MCG/ACT inhaler Inhale 2 puffs into the lungs 2 (two) times daily. 1 Inhaler 0  . cetirizine (ZYRTEC) 10 MG tablet Take 10 mg by mouth daily.    . fluticasone (FLONASE) 50 MCG/ACT nasal spray Place 2 sprays into both nostrils daily.    . Garlic 1000 MG CAPS Take 1,000 mg by mouth daily.     Marland Kitchen  losartan (COZAAR) 50 MG tablet Take 0.5 tablets (25 mg total) by mouth daily. (Patient taking differently: Take 50 mg by mouth daily. )    . montelukast (SINGULAIR) 10 MG tablet Take 1 tablet (10 mg total) by mouth at bedtime. 30 tablet 11  . Multiple Vitamin (MULTIVITAMIN WITH MINERALS) TABS tablet Take 1 tablet by mouth daily.    . nitroGLYCERIN (NITROSTAT) 0.4 MG SL tablet Place 1 tablet (0.4 mg total) under the tongue every 5 (five) minutes x 3 doses as needed for chest pain. 25 tablet 12  . omeprazole (PRILOSEC) 40 MG capsule Take 1 capsule (40 mg total) by mouth 2 (two) times daily. 60 capsule 2  . ranolazine (RANEXA) 1000 MG SR tablet Take 1 tablet (1,000 mg total) by mouth 2 (two) times daily. 60 tablet 5  . ticagrelor (BRILINTA) 90 MG TABS tablet Take 1 tablet (90 mg total) by mouth 2 (two) times daily. 60 tablet 11  . isosorbide mononitrate (IMDUR) 30 MG 24 hr  tablet Take 2 tablets (60mg ) by mouth in the morning and 1 tablet (30mg ) by mouth in the evening (Patient taking differently: Take 30-60 mg by mouth See admin instructions. Take 2 tablets (60mg ) by mouth in the morning and 1 tablet (30mg ) by mouth in the evening) 270 tablet 0  . budesonide-formoterol (SYMBICORT) 160-4.5 MCG/ACT inhaler Take 2 puffs first thing in am and then another 2 puffs about 12 hours later. 1 Inhaler 11  . mometasone-formoterol (DULERA) 200-5 MCG/ACT AERO Inhale 2 puffs into the lungs 2 (two) times daily. 1 Inhaler 11   No facility-administered medications prior to visit.      Allergies:   Oxycontin [oxycodone hcl]; Percocet [oxycodone-acetaminophen]; and Ambien [zolpidem tartrate]   Social History   Social History  . Marital status: Married    Spouse name: N/A  . Number of children: 2  . Years of education: 14   Occupational History  . 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 Smoking status: Former Smoker    Packs/day: 1.00    Years: 5.00    Types: Cigarettes      Quit date: 12/14/1968  . Smokeless tobacco: Never Used  . Alcohol use 0.6 oz/week    1 Cans of beer per week  . Drug use: No  . Sexual activity: Not Currently   Other Topics Concern  . None   Social History Narrative   Patient lives at home with his wife . He is retired. Patient has 14 years of education. Patient has two children.   Caffeine - two cups daily.     Family History:  The patient's family history includes Alzheimer's disease in his father; Dementia in his father; Heart disease in his mother; Heart disease (age of onset: 1860) in his brother.   ROS:   Please see the history of present illness.    ROS All other systems reviewed and are negative.   PHYSICAL EXAM:   VS:  BP 117/77   Pulse 71   Ht 6\' 3"  (1.905 m)   Wt 204 lb 9.6 oz (92.8 kg)   BMI 25.57 kg/m    GEN: Well nourished, well developed, in no acute distress  HEENT: normal  Neck: no JVD, carotid bruits, or masses Cardiac: RRR; no murmurs, rubs, or gallops,no edema  Respiratory:  clear to auscultation bilaterally, normal work of breathing GI: soft, nontender, nondistended, + BS MS: no deformity or atrophy  Skin: warm and dry, no rash Neuro:  Alert and Oriented x 3, Strength and sensation are intact Psych: euthymic mood, full affect  Wt Readings from Last 3 Encounters:  11/02/16 204 lb 9.6 oz (92.8 kg)  08/07/16 201 lb 9.6 oz (91.4 kg)  07/28/16 197 lb 5 oz (89.5 kg)      Studies/Labs Reviewed:   EKG:  EKG is ordered today.  The ekg ordered today demonstrates Normal sinus rhythm without significant ST-T wave changes  Recent Labs: 03/01/2016: B Natriuretic Peptide 51.0; Magnesium 1.9 05/14/2016: ALT 32 06/17/2016: BUN 16; Creatinine, Ser 0.84; Hemoglobin 16.5; Platelets 226; Potassium 4.4; Sodium 137   Lipid Panel    Component Value Date/Time   CHOL 165 03/01/2016 0448   TRIG 85 03/01/2016 0448   HDL 55 03/01/2016 0448   CHOLHDL 3.0 03/01/2016 0448   VLDL 17 03/01/2016 0448   LDLCALC 93  03/01/2016 0448    Additional studies/ records that were reviewed today include:   Cath 03/01/2016 Conclusion    Mid  RCA lesion, 25% stenosed.  Dist RCA lesion, 30% stenosed.  Ost RPDA lesion, 30% stenosed.  Ost Ramus to Ramus lesion, 25% stenosed.  Dist LAD lesion, 50% stenosed.  Ost Cx to Prox Cx lesion, 25% stenosed.  Ost LAD lesion, 95% stenosed. Post intervention, there is a 0% residual stenosis.   1. Severe single-vessel coronary artery disease with successful PCI of the ostial LAD 2. Patent left main, ramus branch, left circumflex, and diffusely diseased RCA with no obstructive lesions 3. Normal LV function by echo  Recommendations: Dual antiplatelet therapy with aspirin and brilinta at least 12 months. Would consider re-look cardiac catheterization in 6-12 months regardless of symptoms because of high risk anatomy. Keep tomorrow and ok to discharge Tuesday morning if stable    Echo 03/01/2016 LV EF: 60% -   65%  - Left ventricle: The cavity size was normal. Wall thickness was   increased in a pattern of moderate LVH. Systolic function was   normal. The estimated ejection fraction was in the range of 60%   to 65%. Wall motion was normal; there were no regional wall   motion abnormalities. Doppler parameters are consistent with   abnormal left ventricular relaxation (grade 1 diastolic   dysfunction). The E/e&' ratio is <8, suggesting normal LV filling   pressure. - Mitral valve: Mildly thickened leaflets . There was trivial   regurgitation. - Left atrium: The atrium was normal in size. - Tricuspid valve: There was mild regurgitation. - Pulmonary arteries: PA peak pressure: 25 mm Hg (S). - Inferior vena cava: The vessel was normal in size. The   respirophasic diameter changes were in the normal range (>= 50%),   consistent with normal central venous pressure.  Impressions:  - Compared to a prior echo in 2012, there are few changes. LVEF is   60-65%, grade 1  DD with normal LV filling pressure, normal LA   size.   Cath 05/14/2016 Conclusion    Ramus lesion, 80% stenosed.  Dist RCA lesion, 20% stenosed.  The left ventricular systolic function is normal.   Normal LV function.  Widely patent LAD ostial stent without residual narrowing.  Progressive 80% ostial ramus intermediate stenosis which appears to be jailed by the very proximal stent strut extending into the left main from the LAD ostium.  Normal left circumflex coronary artery.  Dominant RCA with mild 20% smooth narrowing in the region of the acute margin.  RECOMMENDATION: As noted above, angiograms were reviewed with Dr. Excell Seltzerooper.  We will obtain additional colleagues review in the morning  concerning possible PTCA of the diagonal through the stent strut but there is risk that the dilated strut may then interfere with the circumflex takeoff.  Presently, the patient was started on additional medical therapy prior to final recommendations.    Cath 06/17/2016 Conclusion    Prox RCA to Mid RCA lesion, 25% stenosed.  Ost LM lesion, 25% stenosed.  Ost Ramus lesion, 75% stenosed.  Mid LAD to Dist LAD lesion, 50% stenosed.  Ost 2nd Diag to 2nd Diag lesion, 30% stenosed.   1. Continued patency of the proximal LAD stent 2. Mild diffuse nonobstructive RCA stenosis with diffuse coronary ectasia 3. Patent left circumflex 4. Moderate-severe stenosis of the ramus intermedius with negative pressure wire analysis (FFR = 0.92 baseline and 0.85 at peak hyperemia)  Recommend: resume cardiac rehab, medical therapy.     ASSESSMENT:    1. Chest pain, unspecified type   2. Essential hypertension   3. Hyperlipidemia, unspecified  hyperlipidemia type   4. Coronary artery disease involving native coronary artery of native heart with other form of angina pectoris (HCC)      PLAN:  In order of problems listed above:  1. Atypical chest pain: Plan medical therapy, increase Imdur to 60  mg twice a day. At this time, would not recommend to pursue any ischemic workup unless his symptoms become more frequent or become more exertional in nature.  2. CAD: DES to ostial LAD in March 2017, also had significant ostial ramus lesion currently on medical therapy due to his proximity to the stent strut, any PCI could potentially compromise the left circumflex system.  3. HTN: Blood pressure well controlled, we'll increase Imdur for antianginal purposes.  4. HLD: Continue Lipitor 80 mg daily.    Medication Adjustments/Labs and Tests Ordered: Current medicines are reviewed at length with the patient today.  Concerns regarding medicines are outlined above.  Medication changes, Labs and Tests ordered today are listed in the Patient Instructions below. Patient Instructions  Medication Instructions:  INCREASE- Isosorbide 60 mg twice a day  Labwork: None Ordered  Testing/Procedures: None Ordered  Follow-Up: Your physician recommends that you schedule a follow-up appointment in: 2 Months with Dr Rennis Golden   Any Other Special Instructions Will Be Listed Below (If Applicable).         Happy Thanksgiving  If you need a refill on your cardiac medications before your next appointment, please call your pharmacy.      Ramond Dial, [place]  11/02/2016 11:15 PM    [facility] [address] [place], [place], [place]  [address] Phone: [phone]; Fax: [phone]

## 2016-11-02 NOTE — Patient Instructions (Signed)
Medication Instructions:  INCREASE- Isosorbide 60 mg twice a day  Labwork: None Ordered  Testing/Procedures: None Ordered  Follow-Up: Your physician recommends that you schedule a follow-up appointment in: 2 Months with Dr Rennis GoldenHilty   Any Other Special Instructions Will Be Listed Below (If Applicable).         Happy Thanksgiving  If you need a refill on your cardiac medications before your next appointment, please call your pharmacy.

## 2016-11-02 NOTE — Telephone Encounter (Signed)
Pt states that his cp that comes and goes right now it is just slight this morning but not right now, states that might be indigestion just doesn't know. Has not taken his BP or pulse at all. Has been happening last few days and is happening while at rest. The worst episode was yesterday. SOB?no right now but yesterday Duration? Few minutes How long? Last few days Sob WHEN SITTING? No, denies any palpitations NAUSEA?no  SWEATING?no Made appt with Lisabeth DevoidMeng today 11am

## 2016-11-03 NOTE — Telephone Encounter (Signed)
Patient seen in the clinic. Thank you.

## 2016-11-11 DIAGNOSIS — Z23 Encounter for immunization: Secondary | ICD-10-CM | POA: Diagnosis not present

## 2016-12-06 ENCOUNTER — Other Ambulatory Visit: Payer: Self-pay | Admitting: Internal Medicine

## 2016-12-06 DIAGNOSIS — R079 Chest pain, unspecified: Secondary | ICD-10-CM

## 2016-12-08 NOTE — Telephone Encounter (Signed)
REFILL 

## 2016-12-29 ENCOUNTER — Other Ambulatory Visit (INDEPENDENT_AMBULATORY_CARE_PROVIDER_SITE_OTHER): Payer: Medicare Other

## 2016-12-29 ENCOUNTER — Ambulatory Visit (INDEPENDENT_AMBULATORY_CARE_PROVIDER_SITE_OTHER): Payer: Medicare Other | Admitting: Internal Medicine

## 2016-12-29 ENCOUNTER — Encounter: Payer: Self-pay | Admitting: Internal Medicine

## 2016-12-29 VITALS — BP 114/80 | HR 72 | Temp 97.6°F | Ht 75.0 in | Wt 205.0 lb

## 2016-12-29 DIAGNOSIS — J4541 Moderate persistent asthma with (acute) exacerbation: Secondary | ICD-10-CM

## 2016-12-29 LAB — CBC WITH DIFFERENTIAL/PLATELET
BASOS PCT: 0.7 % (ref 0.0–3.0)
Basophils Absolute: 0 10*3/uL (ref 0.0–0.1)
Eosinophils Absolute: 1.2 10*3/uL — ABNORMAL HIGH (ref 0.0–0.7)
HCT: 47.4 % (ref 39.0–52.0)
HEMOGLOBIN: 16.6 g/dL (ref 13.0–17.0)
LYMPHS ABS: 1.1 10*3/uL (ref 0.7–4.0)
Lymphocytes Relative: 18.1 % (ref 12.0–46.0)
MCHC: 35 g/dL (ref 30.0–36.0)
MCV: 92.9 fl (ref 78.0–100.0)
MONO ABS: 0.7 10*3/uL (ref 0.1–1.0)
Monocytes Relative: 11.4 % (ref 3.0–12.0)
Neutro Abs: 3 10*3/uL (ref 1.4–7.7)
Neutrophils Relative %: 50 % (ref 43.0–77.0)
Platelets: 226 10*3/uL (ref 150.0–400.0)
RBC: 5.11 Mil/uL (ref 4.22–5.81)
RDW: 13.4 % (ref 11.5–15.5)
WBC: 6.1 10*3/uL (ref 4.0–10.5)

## 2016-12-29 MED ORDER — AMOXICILLIN-POT CLAVULANATE 875-125 MG PO TABS: 1.0000 | ORAL_TABLET | Freq: Two times a day (BID) | ORAL | 0 refills | Status: AC

## 2016-12-29 MED ORDER — PREDNISONE 10 MG PO TABS: ORAL_TABLET | ORAL | 0 refills | Status: DC

## 2016-12-29 MED ORDER — BUDESONIDE-FORMOTEROL FUMARATE 80-4.5 MCG/ACT IN AERO: 2.0000 | INHALATION_SPRAY | Freq: Two times a day (BID) | RESPIRATORY_TRACT | 11 refills | Status: DC

## 2016-12-29 MED ORDER — BUDESONIDE-FORMOTEROL FUMARATE 80-4.5 MCG/ACT IN AERO: 2.0000 | INHALATION_SPRAY | Freq: Two times a day (BID) | RESPIRATORY_TRACT | 12 refills | Status: DC

## 2016-12-29 NOTE — Progress Notes (Signed)
Subjective:    Patient ID: Louis Perez, male    DOB: 09/11/1951  MRN: [id number]    Brief patient profile:  [id number] yowm smoked only as teenager with chronic nasal congestion x decades worse in winters s/p sinus surgery 2013 then began having cough/ wheeze referred 06/02/2013 to pulmonary clinic by Dr Louis Perez.    History of Present Illness  06/02/2013 1st pulmonary eval cc cough starting "shortly after" sinus surgery then breathing problems esp last 6 months assoc with change in voice. Onset of cough and sob was gradual, progressively worse with sense of chest congestion and production of thick green mucus but no vomiting. rec augmentin 875 twice daily x 10 days Prednisone 10 mg take  4 each am x 2 days,   2 each am x 2 days,  1 each am x 2 days and stop  Dulera 200 Take 2 puffs first thing in am and then another 2 puffs about 12 hours later.  Only use your albuterol(ventolin) as a rescue medication  - don't leave home without the rescue inhaler Pantoprazole (protonix) 40 mg   Take 30-60 min before first meal of the day and Pepcid 20 mg one bedtime until return to office - this is the best way to tell whether stomach acid is contributing to your problem.     06/30/2013 f/u ov/Louis Perez re cough Chief Complaint  Patient presents with  . Follow-up    Breathing doing much better, no co's today.  no need at all for saba, not limited by breathing.  Sensation of something stuck in throat comes and goes ever since sinus surgery and worse lately so then went back on protonix rec Stop protonix and just try the rantidine 150 after bfast and be careful with your diet  Reduce dulera to 100  Take 2 puffs first thing in am and then another 2 puffs about 12 hours later   08/16/13 f/u ov / Louis Perez  Chief Complaint  Patient presents with  . Follow-up    Pt states that breathing is doing well. He c/o cough x 2 wks- prod with minimal yellow sputum.   Persistent sense of throat tickle, no need for sab, not  disturbing sleep exac in ams,some worse off ppi rec Pantoprazole (protonix) 40 mg   Take 30-60 min before first meal of the day and Pepcid 20 mg one bedtime until return to office - this is the best way to tell whether stomach acid is contributing to your problem.   Stop dulera 100  to see if your cough or breathing worsen> restart it  GERD rx   09/20/2013 f/u ov/Louis Perez re: recurrent cough x 2 weeks on ppi/h2hs  Chief Complaint  Patient presents with  . Acute Visit    Reports increased DOE, wheezing, coughing. Onset was 1 week ago. Feels as if this may be related to his allergies.  did not restart the dulera immediately but sounds like he was compliant with acid, with esp noct wheeze assoc with nasal congestion typical of this time of year rec Continue dulera 100 Take 2 puffs first thing in am and then another 2 puffs about 12 hours later and out through nose  Also continue the acid suppression  Only use your albuterol   Prednisone 10 mg take  4 each am x 2 days,   2 each am x 2 days,  1 each am x 2 days and stop  Augmentin 875 twice daily x 10 days  Please schedule a follow  up office visit in 4 weeks, sooner if needed bring all active medication   10/18/2013 f/u ov/Louis Perez re: asthma/ cough did not bring meds  Chief Complaint  Patient presents with  . Follow-up    Pt states that his cough, dyspnea and wheezing are slightly improved since last visit. He still notices wheezing at night. Cough is prod with moderate yellow to green sputum.    not feeling the need to use cough suppression at this point, rare need for saba but felt the best when on dulera 200 2bid  rec dulera 200 mcg Take 2 puffs first thing in am and then another 2 puffs about 12 hours later.  Only use your albuterol as a rescue medication Try taper off the am acid protonix(pantaprazole) to see if any daytime symptoms flare but continue the bedtime pepcid until return See Dr Louis Perez re your green post-nasal drainage> did not  do   08/22/2015 acute  ov/Louis Perez re: poorly controlled asthma  Chief Complaint  Patient presents with  . Acute Visit    Pt c/o wheezing, esp at night for the past 3 months. He also has had increased SOB and prod cough with yellow to green sputum.     confused re dose of dulera actually just on one bid dose, much worse x 3 month assoc with nasal congestion and doe as well as increase saba need   rec Dulera 200 mcg Take 2 puffs first thing in am and then another 2 puffs about 12 hours later.  Only use your albuterol as a rescue medication as needed  Try taper off the am acid protonix(pantaprazole) to see if any daytime symptoms flare but continue the bedtime pepcid until return Augmentin 875 mg take one pill twice daily  X 10 days - take at breakfast and supper with large glass of water.   Prednisone 10 mg take  4 each am x 2 days,   2 each am x 2 days,  1 each am x 2 days and stop      Acute 07/07/16 Louis Perez Worse breathing x 1-2 weeks while on dulera 200  Nose always a little stuffy/ some am cough  rec  Solu-Medrol 80 mg IM now Prednisone dosepak - 60 mg - take as directed Call if no better by Friday Prescription for [facility] will be sent to [facility] Sample 1     08/07/2016  f/u ov/Louis Perez re: s/p asthma flare/ poorly controlled rhintis Chief Complaint  Patient presents with  . Follow-up    Breathing has returned to baseline. He seldom uses albuterol.   did not know max rx for albuterol but otherwise self managing fine Still some am cough  Attributed to pnds/ occ green am mucus/ f/u ent [facility]  rec Plan A = Automatic = Symbicort 160/dulera 200 Take 2 puffs first thing in am and then another 2 puffs about 12 hours later.   Also add singulair 10 mg each pm x one month trial to see if helps nasal symptoms and if not stop it  Plan B = Backup Only use your albuterol (proair) as a rescue medication  Pulmonary follow up is as needed with formulary in hand     12/29/2016  f/u ov/Louis Perez re:  chronic asthma/ rhinitis on symb 160 2bid / no need for albuterol at baseline  Chief Complaint  Patient presents with  . Acute Visit    Pt c/o increased wheezing, cough and SOB x 2 wks- symptoms are worse at night. Cough has  been prod with green to yellow sputum.    stopped singulair p a month no benefit and contnued to have sensation of drainage during and wakes up full of phlegm same pattern x several years ever since sinus surgery s seasonal /environmental variation then abruptly 2 weeks prior to OV  Much worse sense of "drainage" with assoc cough/ wheeze/ sob worse hat hs  Since onset  Of sob has used saba twice daily  Mucus is always discolored but more so since onset of flare   No obvious patterns in day to day or daytime variabilty or assoc mucuus plugs/ hemoptysis/ cp or chest tightness,  overt  hb symptoms. No unusual exp hx or h/o childhood pna/ asthma or knowledge of premature birth.    Also denies any obvious fluctuation of symptoms with weather or environmental changes or other aggravating or alleviating factors except as outlined above    Current Medications, Allergies, Past Medical History, Past Surgical History, Family History, and Social History were reviewed in [employer] record.  ROS  The following are not active complaints unless bolded sore throat, dysphagia, dental problems, itching, sneezing,  nasal congestion or excess/ purulent secretions, ear ache,   fever, chills, sweats, unintended wt loss, pleuritic or exertional cp, hemoptysis,  orthopnea pnd or leg swelling, presyncope, palpitations, heartburn, abdominal pain, anorexia, nausea, vomiting, diarrhea  or change in bowel or urinary habits, change in stools or urine, dysuria,hematuria,  rash, arthralgias, visual complaints, headache, numbness weakness or ataxia or problems with walking or coordination,  change in mood/affect or memory.             Objective:   Physical Exam  Amb   wm nad  though nasal tone to voice   09/20/2013       219 > 217 10/18/2013  > 08/22/2015  221 > 08/07/2016  203 > 12/29/2016 205     08/16/13 219 lb (99.338 kg)  07/07/13 215 lb (97.523 kg)  06/30/13 217 lb (98.431 kg)    Vital signs reviewed   - Note on arrival 02 sats  95% on RA    HEENT: nl dentition,  and oropharynx  - moderate bilateral non-specific turbinate edema    Nl external ear canals without cough reflex   NECK :  without JVD/Nodes/TM/ nl carotid upstrokes bilaterally   LUNGS: no acc muscle use,minimal insp and exp rhonchi bialterally   CV:  RRR  no s3 or murmur or increase in P2, no edema   ABD:  soft and nontender with nl excursion in the supine position. No bruits or organomegaly, bowel sounds nl  MS:  warm without deformities, calf tenderness, cyanosis or clubbing    Labs ordered 12/29/2016   Allergy profile         Assessment & Plan:

## 2016-12-29 NOTE — Patient Instructions (Addendum)
Decrease symbicort to 80 Take 2 puffs first thing in am and then another 2 puffs about 12 hours later.    Only use your albuterol as a rescue medication to be used if you can't catch your breath by resting or doing a relaxed purse lip breathing pattern.  - The less you use it, the better it will work when you need it. - Ok to use up to 2 puffs  every 4 hours if you must but call for immediate appointment if use goes up over your usual need - Don't leave home without it !!  (think of it like the spare tire for your car)   Augmentin 875 mg take one pill twice daily  X 10 days - take at breakfast and supper with large glass of water.  It would help reduce the usual side effects (diarrhea and yeast infections) if you ate cultured yogurt at lunch.   Prednisone 10 mg take  4 each am x 2 days,   2 each am x 2 days,  1 each am x 2 days and stop    omeprazole 40 mg Take 30- 60 min before your first and last meals of the day   GERD (REFLUX)  is an extremely common cause of respiratory symptoms just like yours , many times with no obvious heartburn at all.    It can be treated with medication, but also with lifestyle changes including elevation of the head of your bed (ideally with 6 inch  bed blocks),  Smoking cessation, avoidance of late meals, excessive alcohol, and avoid fatty foods, chocolate, peppermint, colas, red wine, and acidic juices such as orange juice.  NO MINT OR MENTHOL PRODUCTS SO NO COUGH DROPS  USE SUGARLESS CANDY INSTEAD (Jolley ranchers or Stover's or Life Savers) or even ice chips will also do - the key is to swallow to prevent all throat clearing. NO OIL BASED VITAMINS - use powdered substitutes.    Please see patient coordinator before you leave today  to schedule sinus Ct in 2 weeks and office with all medications in hand

## 2016-12-30 LAB — RESPIRATORY ALLERGY PROFILE REGION II ~~LOC~~
Allergen, A. alternata, m6: <0.1 kU/L
Allergen, D pternoyssinus,d7: <0.1 kU/L
Allergen, Mulberry, t76: <0.1 kU/L
Allergen, P. notatum, m1: <0.1 kU/L
Aspergillus fumigatus, m3: <0.1 kU/L
Box Elder IgE: <0.1 kU/L
Cockroach: <0.1 kU/L
Common Ragweed: <0.1 kU/L
D. farinae: <0.1 kU/L
IGE (IMMUNOGLOBULIN E), SERUM: 84 kU/L (ref <115)
Johnson Grass: <0.1 kU/L
Pecan/Hickory Tree IgE: <0.1 kU/L
Rough Pigweed  IgE: <0.1 kU/L
Timothy Grass: <0.1 kU/L

## 2016-12-30 NOTE — Assessment & Plan Note (Signed)
- allergy profile 09/20/2013 > 7% eos >>> IgE 31.2 and no resp allergens identified  - Spirometry 08/07/2016  FEV1 3.0 (73%)  Ratio 80 p am dulera 200 - FENO 08/07/2016  =   19 on dulera 200 2bid but persistent nasal congestion/ pnd - added singulair 10 mg q pm 08/07/2016 > no change so did not fill it   - Allergy profile 12/29/2016 >  Eos 1.2 /  IgE  84 neg RAST  - 12/29/2016  After extensive coaching HFA effectiveness =    90% try reduce symb to 80 2bid  Upper airway symptoms are more concerning than asthma component at this point but clearly having a mild exacerbation and DDX of  difficult airways management almost all start with A and  include Adherence, Ace Inhibitors, Acid Reflux, Active Sinus Disease, Alpha 1 Antitripsin deficiency, Anxiety masquerading as Airways dz,  ABPA,  Allergy(esp in young), Aspiration (esp in elderly), Adverse effects of meds,  Active smokers, A bunch of PE's (a small clot burden can't cause this syndrome unless there is already severe underlying pulm or vascular dz with poor reserve) plus two Bs  = Bronchiectasis and Beta blocker use..and one C= CHF   Adherence is always the initial "prime suspect" and is a multilayered concern that requires a "trust but verify" approach in every patient - starting with knowing how to use medications, especially inhalers, correctly, keeping up with refills and understanding the fundamental difference between maintenance and prns vs those medications only taken for a very short course and then stopped and not refilled.  - 12/29/2016  After extensive coaching HFA effectiveness =    90% > continue symbicort but reduce maint rx to 80 2bid  - return with all meds in hand using a trust but verify approach to confirm accurate Medication  Reconciliation The principal here is that until we are certain that the  patients are doing what we've asked, it makes no sense to ask them to do more.   ? Active sinus dz > Augmentin 875 mg take one pill twice  daily  X 10 days - take at breakfast and supper with large glass of water.  It would help reduce the usual side effects (diarrhea and yeast infections) if you ate cultured yogurt at lunch. Then sinus ct and refer to ENT PRN   ? Allergy > Eos 1.2 but IgE and rast unimpressive as the hx for any obvious trigger and neg resp to singulair  rec Prednisone 10 mg take  4 each am x 2 days,   2 each am x 2 days,  1 each am x 2 days and stop  ? Acid (or non-acid) GERD > always difficult to exclude as up to 75% of pts in some series report no assoc GI/ Heartburn symptoms> rec max (24h)  acid suppression and diet restrictions/ reviewed and instructions given in writing.   I had an extended discussion with the patient reviewing all relevant studies completed to date and  lasting 15 to 20 minutes of a 25 minute acute office visit    Each maintenance medication was reviewed in detail including most importantly the difference between maintenance and prns and under what circumstances the prns are to be triggered using an action plan format that is not reflected in the computer generated alphabetically organized AVS.    Please see AVS for specific instructions unique to this visit that I personally wrote and verbalized to the the pt in detail and then reviewed  with pt  by my nurse highlighting any  changes in therapy recommended at today's visit to their plan of care.

## 2017-01-01 NOTE — Progress Notes (Signed)
Left detailed msg with results

## 2017-01-04 ENCOUNTER — Encounter: Payer: Self-pay | Admitting: Internal Medicine

## 2017-01-04 ENCOUNTER — Ambulatory Visit (INDEPENDENT_AMBULATORY_CARE_PROVIDER_SITE_OTHER): Payer: Medicare Other | Admitting: Internal Medicine

## 2017-01-04 VITALS — BP 106/88 | HR 62 | Ht 75.0 in | Wt 202.0 lb

## 2017-01-04 DIAGNOSIS — E785 Hyperlipidemia, unspecified: Secondary | ICD-10-CM

## 2017-01-04 DIAGNOSIS — I25110 Atherosclerotic heart disease of native coronary artery with unstable angina pectoris: Secondary | ICD-10-CM | POA: Diagnosis not present

## 2017-01-04 DIAGNOSIS — Z955 Presence of coronary angioplasty implant and graft: Secondary | ICD-10-CM | POA: Diagnosis not present

## 2017-01-04 DIAGNOSIS — I200 Unstable angina: Secondary | ICD-10-CM | POA: Diagnosis not present

## 2017-01-04 NOTE — Patient Instructions (Signed)
Your physician recommends that you return for lab work FASTING (cardio IQ, CMET)  Your physician recommends that you schedule a follow-up appointment in: THREE MONTHS with Dr. Rennis GoldenHilty

## 2017-01-04 NOTE — Progress Notes (Signed)
Cardiology Office Note   Date:  01/04/2017   ID:  Louis Perez, DOB 1951/07/24, MRN [id number]  PCP:  Delorse LekBURNETT,BRENT A, MD  Cardiologist:   Chrystie NoseKenneth C [name], MD   Chief Complaint  Patient presents with  . Follow-up    pt c/o chest pain      History of Present Illness: Louis Perez is a 66 y.o. male who presents for evaluation of chest and back pain. Patient has a history of coronary disease with recent NSTEMI in March with DES stenting of the LAD.  He had non obstructive disease elsewhere.  EF was normal.    He was added to my schedule Perez for evaluation of chest and arm discomfort. This began on Sunday. It is similar to his previous unstable angina but not as severe. Still he describes the peak of his discomfort at 7 out of 10 in intensity. He gets across his chest. His left arm hurt. At times he has noticed some diaphoresis. It might last for only a minute. He has taken nitroglycerin and over the course of the last few days and it improves the symptoms. He describes it as somewhat heavy. He's not had any palpitations, presyncope or syncope. He's not describing any new shortness of breath, PND or orthopnea. He has brought the discomfort on a few times when he's been doing activities although this is been intermittent. He was actually able to do a little golfing yesterday without symptoms. However, prior to this he had discomfort with golfing building his children sandbox. This morning the pain was at rest. He still describes a little mid chest tightness.  Of note he has not had the symptoms since his stent.  I did review his catheterization. I looked at the images and he had a very high grade proximal LAD in March with nonobstructive disease in the right coronary.  05/28/2016  Louis Perez is a 66 year old patient who I last saw in 2014. He did not see me in follow-up since that time and is seen a number of other providers in our practice, ultimately leading to presentation  with an STEMI in March with a placement of a drug-eluting stent in the proximal LAD. Subsequent to that he had chest pain as described above and was referred back to the hospital for coronary evaluation. Catheterization indicated an 80% ostial ramus lesion at the area of partially jailed vessel due to the proximal LAD stent. It was felt that intervention could compromise the LAD therefore medical therapy was recommended. His isosorbide was doubled to 60 mg daily. Since then he reports he has had some improvement in chest discomfort but has significant headache.  06/15/2016  Louis Perez was seen back Perez in the office for chest pain. When I last saw him we had increased his isosorbide up to 60 mg daily and he's been on Ranexa thousand milligrams twice a day. He reports he continues to have episodes of chest discomfort which can occur at rest or with exertion. I've advised that he stop cardiac rehabilitation until we can determine whether or not his pain is related to coronary obstruction.  07/23/2016  Louis Perez returns for follow-up Perez. He underwent repeat cardiac catheterization by Dr. Excell Seltzerooper. This showed the following:  Conclusion    Prox RCA to Mid RCA lesion, 25% stenosed.  Ost LM lesion, 25% stenosed.  Ost Ramus lesion, 75% stenosed.  Mid LAD to Dist LAD lesion, 50% stenosed.  Ost 2nd Diag to 2nd Diag  lesion, 30% stenosed.  1. Continued patency of the proximal LAD stent 2. Mild diffuse nonobstructive RCA stenosis with diffuse coronary ectasia 3. Patent left circumflex 4. Moderate-severe stenosis of the ramus intermedius with negative pressure wire analysis (FFR = 0.92 baseline and 0.85 at peak hyperemia)  Recommend: resume cardiac rehab, medical therapy   No intervention was performed. Since discharge she notes that he's had no further chest pain. He feels like the change in his isosorbide is been helpful. He is about to finish card agreeable dictation and will continue to  exercise at the [facility]. He's lost about 5-7 pounds and is well within the normal weight range at this time.  01/04/2017  Louis Perez for follow-up. He reports that initially had some improvement with increasing his medical therapy for angina but still continues to have sharp, intermittent chest pains on a daily basis. This is typically in the right sternal area. He would not clearly say that his symptoms are progressively getting worse. Blood pressure is well-controlled Perez. His LDL cholesterol is 93 on 80 mg Lipitor. I advised him that his goal cholesterol should be much lower and that we may have difficulty reaching that since he is on a high potency statin. Options include adding ezetimibe or perhaps enrolling him in the Orion-10 trial. He did seem to be interested in the study medication. His last lipid profile was earlier this year and he will be due for repeat blood work. He is also had problems with asthma and has seen Dr. Sherene Sires for evaluation of this and to include cyclical shortness of breath.  Past Medical History:  Diagnosis Date  . Arthritis    "hips, lower back, knees, shoulders, hands" (05/14/2016)  . Asthma dx'd in 2014  . Bacterial septicemia (HCC) 01/2011   "both knee involved"  . CAD (coronary artery disease)    a. NSTEMI 02/2016 s/p DES to LAD. b. Relook cath due to angina/prior high risk anatomy 05/14/16 -> continued medical therapy given concern that a stent strut could inferere with Cx itself.  . Coronary artery disease   . GERD (gastroesophageal reflux disease)   . Gilbert's disease   . Hyperlipidemia   . Hypertension   . 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 NSTEMI (non-ST elevated myocardial infarction) (HCC) 02/2016   [name] 03/02/2016  . Sinus bradycardia   . Unspecified tinnitus    right    Past Surgical History:  Procedure Laterality Date  . BACK SURGERY    . CARDIAC CATHETERIZATION N/A 03/01/2016   Procedure: Left Heart Cath and Coronary  Angiography;  Surgeon: Tonny Bollman, MD;  oLAD 95%, CFX/RI/RCA/RPDA 25-30%  . CARDIAC CATHETERIZATION N/A 03/01/2016   Procedure: Coronary Stent Intervention;  Surgeon: Tonny Bollman, MD;  Promus Premier 4.0 x  12 mm DES to oLAD  . CARDIAC CATHETERIZATION N/A 05/14/2016   Procedure: Left Heart Cath and Coronary Angiography;  Surgeon: Lennette Bihari, MD;  Location: [facility] INVASIVE CV LAB;  Service: Cardiovascular;  Laterality: N/A;  . CARDIAC CATHETERIZATION N/A 06/17/2016   Procedure: Intravascular Pressure Wire/FFR Study;  Surgeon: Tonny Bollman, MD;  Location: [facility] INVASIVE CV LAB;  Service: Cardiovascular;  Laterality: N/A;  . CARDIOVASCULAR STRESS TEST  05/26/2012   EKG negative for ischemia, no ECG changes, no significant ischemia noted  . I&D KNEE WITH POLY EXCHANGE Bilateral 01/2011   & complete synovectomy of 3 compartments of the right total knee/notes 02/04/2011 (replacement,partial knee replacement because of bacterial infection)  . JOINT REPLACEMENT    .  KNEE ARTHROSCOPY Right ~ 2002  . LUMBAR DISC SURGERY  ~ 1980; 2000  . NASAL SINUS SURGERY  08/2011  . SHOULDER ARTHROSCOPY W/ ROTATOR CUFF REPAIR Right 01/2015  . TOTAL KNEE ARTHROPLASTY Bilateral 03/26/2004-09/15/2004    right-left  . TRANSTHORACIC ECHOCARDIOGRAM  05/26/2012   EF >55%, mild concentric LVH     Current Outpatient Prescriptions  Medication Sig Dispense Refill  . albuterol (PROAIR HFA) 108 (90 BASE) MCG/ACT inhaler 2 puffs every 4 hours as needed only  if your can't catch your breath (Patient taking differently: Inhale 2 puffs into the lungs every 4 (four) hours as needed for shortness of breath. ) 1 Inhaler 1  . amoxicillin-clavulanate (AUGMENTIN) 875-125 MG tablet Take 1 tablet by mouth 2 (two) times daily. 20 tablet 0  . aspirin 81 MG tablet Take 81 mg by mouth at bedtime.     Marland Kitchen atorvastatin (LIPITOR) 80 MG tablet Take 1 tablet (80 mg total) by mouth daily at 6 PM. 30 tablet 30  . budesonide-formoterol (SYMBICORT) 80-4.5  MCG/ACT inhaler Inhale 2 puffs into the lungs 2 (two) times daily. 1 Inhaler 11  . diphenhydrAMINE (BENADRYL) 25 MG tablet Take 25 mg by mouth every 6 (six) hours as needed for allergies.    . fluticasone (FLONASE) 50 MCG/ACT nasal spray Place 2 sprays into both nostrils daily.    . Garlic 1000 MG CAPS Take 1,000 mg by mouth daily.     . isosorbide mononitrate (IMDUR) 60 MG 24 hr tablet Take 1 tablet (60 mg total) by mouth 2 (two) times daily. 60 tablet 11  . losartan (COZAAR) 50 MG tablet Take 0.5 tablets (25 mg total) by mouth daily. (Patient taking differently: Take 50 mg by mouth daily. )    . Multiple Vitamin (MULTIVITAMIN WITH MINERALS) TABS tablet Take 1 tablet by mouth daily.    . nitroGLYCERIN (NITROSTAT) 0.4 MG SL tablet Place 1 tablet (0.4 mg total) under the tongue every 5 (five) minutes x 3 doses as needed for chest pain. 25 tablet 12  . omeprazole (PRILOSEC) 40 MG capsule Take 1 capsule (40 mg total) by mouth 2 (two) times daily. 60 capsule 2  . predniSONE (DELTASONE) 10 MG tablet Take  4 each am x 2 days,   2 each am x 2 days,  1 each am x 2 days and stop 14 tablet 0  . RANEXA 1000 MG SR tablet TAKE 1 TABLET (1,000 MG TOTAL) BY MOUTH 2 (TWO) TIMES DAILY. 60 tablet 0  . ticagrelor (BRILINTA) 90 MG TABS tablet Take 1 tablet (90 mg total) by mouth 2 (two) times daily. 60 tablet 11   No current facility-administered medications for this visit.     Allergies:   Oxycontin [oxycodone hcl]; Percocet [oxycodone-acetaminophen]; and Ambien [zolpidem tartrate]    Social History:  The patient  reports that he quit smoking about 48 years ago. His smoking use included Cigarettes. He has a 5.00 pack-year smoking history. He has never used smokeless tobacco. He reports that he drinks about 0.6 oz of alcohol per week . He reports that he does not use drugs.   Family History:  The patient's family history includes Alzheimer's disease in his father; Dementia in his father; Heart disease in his  mother; Heart disease (age of onset: 95) in his brother.    ROS:  Please see the history of present illness.   Otherwise, review of systems are positive for none.   All other systems are reviewed and negative.  PHYSICAL EXAM: VS:  BP 106/88   Pulse 62   Ht 6\' 3"  (1.905 m)   Wt 202 lb (91.6 kg)   BMI 25.25 kg/m  , BMI Body mass index is 25.25 kg/m. General appearance: alert and no distress Lungs: clear to auscultation bilaterally Heart: regular rate and rhythm, S1, S2 normal, no murmur, click, rub or gallop Extremities: extremities normal, atraumatic, no cyanosis or edema Neurologic: Grossly normal  EKG:   Normal sinus rhythm at 62  Recent Labs: 03/01/2016: B Natriuretic Peptide 51.0; Magnesium 1.9 05/14/2016: ALT 32 06/17/2016: BUN 16; Creatinine, Ser 0.84; Potassium 4.4; Sodium 137 12/29/2016: Hemoglobin 16.6; Platelets 226.0    Lipid Panel    Component Value Date/Time   CHOL 165 03/01/2016 0448   TRIG 85 03/01/2016 0448   HDL 55 03/01/2016 0448   CHOLHDL 3.0 03/01/2016 0448   VLDL 17 03/01/2016 0448   LDLCALC 93 03/01/2016 0448      Wt Readings from Last 3 Encounters:  01/04/17 202 lb (91.6 kg)  12/29/16 205 lb (93 kg)  11/02/16 204 lb 9.6 oz (92.8 kg)    ASSESSMENT AND PLAN:  Patient Active Problem List   Diagnosis Date Noted  . Hyperlipidemia 07/23/2016  . Anginal chest pain at rest [facility]) 06/17/2016  . CAD in native artery 06/15/2016  . Stented coronary artery 06/15/2016  . Pain in the chest 05/28/2016  . Sinus bradycardia 05/15/2016  . Unstable angina (HCC) 05/14/2016  . CAD (coronary artery disease), native coronary artery 05/14/2016  . Dyslipidemia 09/28/2013  . GERD (gastroesophageal reflux disease) 09/28/2013  . 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 Moderate persistent chronic asthma with acute exacerbation 06/05/2013  . Hypertension 06/05/2013   PLAN: Louis Perez to have intermittent sharp chest pain symptoms. He did have an episode a few weeks  ago which almost brought him to the emergency department. It could be that he is failing medical therapy or that his symptoms may be nonrelated. He is undergoing treatment for moderate persistent asthma and does note some significant shortness of breath as well as voice changes. We talked about management options and he wishes to try to start exercise again and work on optimizing his lungs before we would consider further workup for repeat catheterization. We did discuss obtaining a repeat lipid profile which she will do and if he is not at goal he would be a good candidate for the Orion-10 trial.  Follow-up with me in 3 months.  Chrystie Nose, MD, [facility] Attending Cardiologist [facility]  Chrystie Nose, MD  01/04/2017 4:23 PM

## 2017-01-04 NOTE — Addendum Note (Signed)
Addended by: Alyson InglesBROOME, Emmalou Hunger L on: 01/04/2017 11:05 AM   Modules accepted: Orders

## 2017-01-05 ENCOUNTER — Ambulatory Visit (INDEPENDENT_AMBULATORY_CARE_PROVIDER_SITE_OTHER)
Admission: RE | Admit: 2017-01-05 | Discharge: 2017-01-05 | Disposition: A | Discharge status: Home / Self Care | Payer: Medicare Other | Source: Ambulatory Visit | Attending: Internal Medicine | Admitting: Internal Medicine

## 2017-01-05 DIAGNOSIS — R0602 Shortness of breath: Secondary | ICD-10-CM | POA: Diagnosis not present

## 2017-01-05 DIAGNOSIS — J4541 Moderate persistent asthma with (acute) exacerbation: Secondary | ICD-10-CM | POA: Diagnosis not present

## 2017-01-06 ENCOUNTER — Telehealth: Payer: Self-pay | Admitting: Internal Medicine

## 2017-01-06 ENCOUNTER — Encounter: Payer: Self-pay | Admitting: Internal Medicine

## 2017-01-06 DIAGNOSIS — J324 Chronic pansinusitis: Secondary | ICD-10-CM | POA: Insufficient documentation

## 2017-01-06 NOTE — Telephone Encounter (Signed)
Yes, definitely needs ent eval - fine with Dr Haroldine Lawsrossley who may have said that to pt I past but that was before the last CT

## 2017-01-06 NOTE — Telephone Encounter (Signed)
Pt aware of MW recommendations. Pt states he is currently a pt's of Dr. Allayne Stackrossley's, therefore no ref is needed. Pt states he will contact Dr. Allayne Stackcrossley's office. Nothing further needed.

## 2017-01-06 NOTE — Telephone Encounter (Signed)
MW  Please Advise-   Spoke with pt. And informed him of his CT scan results. He states he use to see Dr. Haroldine Lawsrossley in the past but was under the impression that he no longer needed to see him again that you would be taking over. He wants to make sure that you do in fact suggest he see ENT due to what you saw in his scan   Notes Recorded by Nyoka CowdenMichael B Wert, MD on 01/06/2017 at 8:34 AM EST Call patient : Study is c/w pansinusitis > needs ent referral but no change rx in meantime

## 2017-01-06 NOTE — Progress Notes (Signed)
LMTCB

## 2017-01-07 ENCOUNTER — Telehealth: Payer: Self-pay | Admitting: Internal Medicine

## 2017-01-07 NOTE — Telephone Encounter (Signed)
Spoke with pt, advised I sent over his CT results over to Dr. Lajoyce Lauberrosley's office at [phone]. Pt verbalized understanding and nothing further is needed.

## 2017-01-07 NOTE — Telephone Encounter (Signed)
The phone # to Dr Lajoyce Lauberrosley's given ti me by pt is [phone].Louis GriffinsStanley A Perez

## 2017-01-08 ENCOUNTER — Telehealth: Payer: Self-pay | Admitting: Neurology

## 2017-01-08 DIAGNOSIS — J321 Chronic frontal sinusitis: Secondary | ICD-10-CM | POA: Diagnosis not present

## 2017-01-08 DIAGNOSIS — E785 Hyperlipidemia, unspecified: Secondary | ICD-10-CM | POA: Diagnosis not present

## 2017-01-08 DIAGNOSIS — J323 Chronic sphenoidal sinusitis: Secondary | ICD-10-CM | POA: Diagnosis not present

## 2017-01-08 DIAGNOSIS — J322 Chronic ethmoidal sinusitis: Secondary | ICD-10-CM | POA: Diagnosis not present

## 2017-01-08 DIAGNOSIS — R490 Dysphonia: Secondary | ICD-10-CM | POA: Diagnosis not present

## 2017-01-08 DIAGNOSIS — J320 Chronic maxillary sinusitis: Secondary | ICD-10-CM | POA: Diagnosis not present

## 2017-01-08 DIAGNOSIS — J370 Chronic laryngitis: Secondary | ICD-10-CM | POA: Diagnosis not present

## 2017-01-08 NOTE — Telephone Encounter (Signed)
Dianne or Kathie RhodesBetty - Dr. Haroldine Lawsrossley called and is sending a new patient referral for this patient. He was last seen by Dr. Hosie PoissonSumner in 2014 and by Dr. Sandria ManlyLove before that. You can place him in one of my new patient slots at his convenience. Also, looks like he was [facility] the [facility] by Dr. Hosie PoissonSumner but I don't see any notes in Care Everywhere [facility] - would you ask patient if he went [facility] or any other facility for any neurologic evaluations because, if so, I would like to get all the info form them in advance. Let us know thanks.   Kara Meadmma, would you mind getting me his old Dr. Sandria ManlyLove notes from [facility]? Thanks!

## 2017-01-09 ENCOUNTER — Other Ambulatory Visit: Payer: Self-pay | Admitting: Internal Medicine

## 2017-01-09 DIAGNOSIS — R079 Chest pain, unspecified: Secondary | ICD-10-CM

## 2017-01-09 LAB — COMPREHENSIVE METABOLIC PANEL
ALBUMIN: 4.1 g/dL (ref 3.6–5.1)
ALT: 25 U/L (ref 9–46)
AST: 21 U/L (ref 10–35)
Alkaline Phosphatase: 83 U/L (ref 40–115)
BILIRUBIN TOTAL: 2.2 mg/dL — AB (ref 0.2–1.2)
BUN: 15 mg/dL (ref 7–25)
CO2: 28 mmol/L (ref 20–31)
CREATININE: 0.88 mg/dL (ref 0.70–1.25)
Calcium: 9.6 mg/dL (ref 8.6–10.3)
Chloride: 101 mmol/L (ref 98–110)
Glucose, Bld: 100 mg/dL — ABNORMAL HIGH (ref 65–99)
Potassium: 4.4 mmol/L (ref 3.5–5.3)
SODIUM: 141 mmol/L (ref 135–146)
TOTAL PROTEIN: 7 g/dL (ref 6.1–8.1)

## 2017-01-11 NOTE — Telephone Encounter (Signed)
Gave to Candi LeashDebra Settle in medical records to pull old notes from [facility] per AA,MD request.

## 2017-01-11 NOTE — Telephone Encounter (Signed)
Great thank you!

## 2017-01-11 NOTE — Telephone Encounter (Signed)
Dr Lucia GaskinsAhern- I placed notes from [facility] with your notes from today, thank you.

## 2017-01-13 LAB — CARDIO IQ(R) ADVANCED LIPID PANEL
APOLIPOPROTEIN (CARDIO IQ ADV LIPID PANEL): 59 mg/dL (ref 52–109)
Cholesterol, Total: 136 mg/dL (ref <200)
Cholesterol/HDL Ratio: 2.6 calc (ref <5.0)
HDL Cholesterol: 53 mg/dL (ref >40)
LDL Large: 6200 nmol/L (ref 4334–10815)
LDL MEDIUM: 222 nmol/L (ref 167–465)
LDL Particle Number: 983 nmol/L — ABNORMAL LOW (ref 1016–2185)
LDL Peak Size: 213.9 Angstrom — ABNORMAL LOW (ref >=218.2)
LDL SMALL: 206 nmol/L (ref 123–441)
LDL, Calculated: 64 mg/dL (ref <100)
LIPOPROTEIN (A) (CARDIO IQ ADV LIPID PANEL): 41 nmol/L (ref <75)
NON-HDL CHOLESTEROL (CARDIO IQ ADV LIPID PANEL): 83 mg/dL (ref <130)
Triglycerides: 109 mg/dL (ref <150)

## 2017-01-14 ENCOUNTER — Encounter: Payer: Self-pay | Admitting: Internal Medicine

## 2017-01-14 DIAGNOSIS — J410 Simple chronic bronchitis: Secondary | ICD-10-CM | POA: Diagnosis not present

## 2017-01-14 DIAGNOSIS — R490 Dysphonia: Secondary | ICD-10-CM | POA: Diagnosis not present

## 2017-01-14 DIAGNOSIS — J301 Allergic rhinitis due to pollen: Secondary | ICD-10-CM | POA: Diagnosis not present

## 2017-01-14 DIAGNOSIS — J322 Chronic ethmoidal sinusitis: Secondary | ICD-10-CM | POA: Diagnosis not present

## 2017-01-14 DIAGNOSIS — J320 Chronic maxillary sinusitis: Secondary | ICD-10-CM | POA: Diagnosis not present

## 2017-01-19 ENCOUNTER — Encounter: Payer: Self-pay | Admitting: Internal Medicine

## 2017-01-19 ENCOUNTER — Ambulatory Visit (INDEPENDENT_AMBULATORY_CARE_PROVIDER_SITE_OTHER): Payer: Medicare Other | Admitting: Internal Medicine

## 2017-01-19 VITALS — BP 154/94 | HR 78 | Ht 75.0 in | Wt 204.0 lb

## 2017-01-19 DIAGNOSIS — I200 Unstable angina: Secondary | ICD-10-CM | POA: Diagnosis not present

## 2017-01-19 DIAGNOSIS — J324 Chronic pansinusitis: Secondary | ICD-10-CM

## 2017-01-19 DIAGNOSIS — J4541 Moderate persistent asthma with (acute) exacerbation: Secondary | ICD-10-CM | POA: Diagnosis not present

## 2017-01-19 MED ORDER — METHYLPREDNISOLONE ACETATE 40 MG/ML INJ SUSP (RADIOLOG
120.0000 mg | Freq: Once | INTRAMUSCULAR | Status: AC
  Administered 2017-01-19: 120 mg via INTRAMUSCULAR

## 2017-01-19 NOTE — Progress Notes (Signed)
Subjective:    Patient ID: Louis Perez, male    DOB: [birth year]  MRN: [id number]    Brief patient profile:  38 yowm smoked only as teenager with chronic nasal congestion x decades worse in winters s/p sinus surgery 2013 then began having cough/ wheeze referred 06/02/2013 to pulmonary clinic by Dr Rosezetta Schlatter.    History of Present Illness  06/02/2013 1st pulmonary eval cc cough starting "shortly after" sinus surgery then breathing problems esp last 6 months assoc with change in voice. Onset of cough and sob was gradual, progressively worse with sense of chest congestion and production of thick green mucus but no vomiting. rec augmentin 875 twice daily x 10 days Prednisone 10 mg take  4 each am x 2 days,   2 each am x 2 days,  1 each am x 2 days and stop  Dulera 200 Take 2 puffs first thing in am and then another 2 puffs about 12 hours later.  Only use your albuterol(ventolin) as a rescue medication  - don't leave home without the rescue inhaler Pantoprazole (protonix) 40 mg   Take 30-60 min before first meal of the day and Pepcid 20 mg one bedtime until return to office -      09/20/2013 f/u ov/Wert re: recurrent cough x 2 weeks on ppi/h2hs  Chief Complaint  Patient presents with  . Acute Visit    Reports increased DOE, wheezing, coughing. Onset was 1 week ago. Feels as if this may be related to his allergies.  did not restart the dulera immediately but sounds like he was compliant with acid, with esp noct wheeze assoc with nasal congestion typical of this time of year rec Continue dulera 100 Take 2 puffs first thing in am and then another 2 puffs about 12 hours later and out through nose  Also continue the acid suppression  Only use your albuterol   Prednisone 10 mg take  4 each am x 2 days,   2 each am x 2 days,  1 each am x 2 days and stop  Augmentin 875 twice daily x 10 days    follow up office visit in 4 weeks bring all active medication   10/18/2013 f/u ov/Wert re: asthma/  cough did not bring meds  Chief Complaint  Patient presents with  . Follow-up    Pt states that his cough, dyspnea and wheezing are slightly improved since last visit. He still notices wheezing at night. Cough is prod with moderate yellow to green sputum.    not feeling the need to use cough suppression at this point, rare need for saba but felt the best when on dulera 200 2bid  rec dulera 200 mcg Take 2 puffs first thing in am and then another 2 puffs about 12 hours later.  Only use your albuterol as a rescue medication Try taper off the am acid protonix(pantaprazole) to see if any daytime symptoms flare but continue the bedtime pepcid until return See Dr Brion Aliment re your green post-nasal drainage> did not do       08/07/2016  f/u ov/Wert re: s/p asthma flare/ poorly controlled rhintis Chief Complaint  Patient presents with  . Follow-up    Breathing has returned to baseline. He seldom uses albuterol.   did not know max rx for albuterol but otherwise self managing fine Still some am cough  Attributed to pnds/ occ green am mucus/ f/u ent crossley  rec Plan A = Automatic = Symbicort 160/dulera 200 Take 2 puffs  first thing in am and then another 2 puffs about 12 hours later.   Also add singulair 10 mg each pm x one month trial to see if helps nasal symptoms and if not stop it  Plan B = Backup Only use your albuterol (proair) as a rescue medication  Pulmonary follow up is as needed with formulary in hand     12/29/2016  f/u ov/Wert re: chronic asthma/ rhinitis on symb 160 2bid / no need for albuterol at baseline  Chief Complaint  Patient presents with  . Acute Visit    Pt c/o increased wheezing, cough and SOB x 2 wks- symptoms are worse at night. Cough has been prod with green to yellow sputum.    stopped singulair p a month no benefit and continued to have sensation of drainage during day  and wakes up full of phlegm same pattern x several years ever since sinus surgery s seasonal  /environmental variation then abruptly 2 weeks prior to OV  Much worse sense of "drainage" with assoc cough/ wheeze/ sob worse hat hs  Since onset  Of sob has used saba twice daily  Mucus is always discolored but more so since onset of flare rec Decrease symbicort to 80 Take 2 puffs first thing in am and then another 2 puffs about 12 hours later.  Only use your albuterol as a rescue medication Augmentin 875 mg take one pill twice daily  X 10 days - take at breakfast and supper with large glass of water.  It would help reduce the usual side effects (diarrhea and yeast infections) if you ate cultured yogurt at lunch.  Prednisone 10 mg take  4 each am x 2 days,   2 each am x 2 days,  1 each am x 2 days and stop  omeprazole 40 mg Take 30- 60 min before your first and last meals of the day  GERD diet  Please see patient coordinator before you leave today  to schedule sinus Ct in 2 weeks and office with all medications in hand      01/19/2017  f/u ov/Wert re:  Asthma/ rhintis/sinusitis changed symbicort  01/16/17  to 160 and some better today  Chief Complaint  Patient presents with  . Follow-up    moderate persistent asthma, not doing much better from last OV, SOB when he lays down at night, wheezing  Breathing Much better p drinking tea or clearing throat / sleeps ok p benadryl at hs but otherwise up all night with cough/wheeze > no  excess/ purulent sputum or mucus plugs    No obvious day to day or daytime variability or assoc sob  or cp or chest tightness, subjective wheeze or overt sinus or hb symptoms. No unusual exp hx or h/o childhood pna/ asthma or knowledge of premature birth.  Sleeping ok without nocturnal  or early am exacerbation  of respiratory  c/o's or need for noct saba. Also denies any obvious fluctuation of symptoms with weather or environmental changes or other aggravating or alleviating factors except as outlined above   Current Medications, Allergies, Complete Past Medical  History, Past Surgical History, Family History, and Social History were reviewed in [facility] electronic medical record.  ROS  The following are not active complaints unless bolded sore throat, dysphagia, dental problems, itching, sneezing,  nasal congestion or excess/ purulent secretions, ear ache,   fever, chills, sweats, unintended wt loss, classically pleuritic or exertional cp, hemoptysis,  orthopnea pnd or leg swelling, presyncope, palpitations,  abdominal pain, anorexia, nausea, vomiting, diarrhea  or change in bowel or bladder habits, change in stools or urine, dysuria,hematuria,  rash, arthralgias, visual complaints, headache, numbness, weakness or ataxia or problems with walking or coordination,  change in mood/affect or memory.                 Objective:   Physical Exam  Amb   wm nad still mod nasal tone, mod hoarseness with freq throat clearing   09/20/2013       219 > 217 10/18/2013  > 08/22/2015  221 > 08/07/2016  203 > 12/29/2016 205  > 01/19/2017  204     08/16/13 219 lb (99.338 kg)  07/07/13 215 lb (97.523 kg)  06/30/13 217 lb (98.431 kg)    Vital signs reviewed   - Note on arrival 02 sats  95% on RA    HEENT: nl dentition,  and oropharynx  - moderate to severe  bilateral non-specific turbinate edema s purulence    Nl external ear canals without cough reflex   NECK :  without JVD/Nodes/TM/ nl carotid upstrokes bilaterally   LUNGS: no acc muscle use,    Lungs completely clear bilaterally to A and P   CV:  RRR  no s3 or murmur or increase in P2, no edema   ABD:  soft and nontender with nl excursion in the supine position. No bruits or organomegaly, bowel sounds nl  MS:  warm without deformities, calf tenderness, cyanosis or clubbing           Assessment & Plan:

## 2017-01-19 NOTE — Patient Instructions (Addendum)
Depomedrol 120 mg IM today   Plan A = Automatic =  symbicort 160 Take 2 puffs first thing in am and then another 2 puffs about 12 hours later.     Plan B = Backup Only use your albuterol as a rescue medication to be used if you can't catch your breath by resting or doing a relaxed purse lip breathing pattern.  - The less you use it, the better it will work when you need it. - Ok to use the inhaler up to 2 puffs  every 4 hours if you must but call for appointment if use goes up over your usual need - Don't leave home without it !!  (think of it like the spare tire for your car)    Keep appt for your allergist but take all your medications with you

## 2017-01-20 NOTE — Assessment & Plan Note (Signed)
-   allergy profile 09/20/2013 > 7% eos >>> IgE 31.2 and no resp allergens identified  - Spirometry 08/07/2016  FEV1 3.0 (73%)  Ratio 80 p am dulera 200 - FENO 08/07/2016  =   19 on dulera 200 2bid but persistent nasal congestion/ pnd - added singulair 10 mg q pm 08/07/2016 > no change so did not fill it  - Allergy profile 12/29/2016 >  Eos 1.2 /  IgE  84 neg RAST  - 12/29/2016  try reduce symb to 80 2bid - Sinus CT 01/06/16 c/w pansinusitis see sep prob - 01/19/2017  After extensive coaching HFA effectiveness =    90% > continue symbicort 160 2bid/ referred to allergy   Symptoms are markedly disproportionate to objective findings and not clear this is a lung problem but pt does appear to have difficult airway management issues. DDX of  difficult airways management almost all start with A and  include Adherence, Ace Inhibitors, Acid Reflux, Active Sinus Disease, Alpha 1 Antitripsin deficiency, Anxiety masquerading as Airways dz,  ABPA,  Allergy(esp in young), Aspiration (esp in elderly), Adverse effects of meds,  Active smokers, A bunch of PE's (a small clot burden can't cause this syndrome unless there is already severe underlying pulm or vascular dz with poor reserve) plus two Bs  = Bronchiectasis and Beta blocker use..and one C= CHF  Adherence is always the initial "prime suspect" and is a multilayered concern that requires a "trust but verify" approach in every patient - starting with knowing how to use medications, especially inhalers, correctly, keeping up with refills and understanding the fundamental difference between maintenance and prns vs those medications only taken for a very short course and then stopped and not refilled.  - hfa teaching/ mastered - needs to always return with all meds in hand using a trust but verify approach to confirm accurate Medication  Reconciliation The principal here is that until we are certain that the  patients are doing what we've asked, it makes no sense to ask them  to do more.    ? Acid (or non-acid) GERD > always difficult to exclude as up to 75% of pts in some series report no assoc GI/ Heartburn symptoms> rec continue max (24h)  acid suppression and diet restrictions/ reviewed     Active/ acute pan sinusitis > (see separate a/p)  ? Allergy > neg RAST but clearly needs w/u and consideration for either Xolair or one of the IL-5 inhibitors.    I had an extended discussion with the patient reviewing all relevant studies completed to date and  lasting 15 to 20 minutes of a 25 minute visit    Each maintenance medication was reviewed in detail including most importantly the difference between maintenance and prns and under what circumstances the prns are to be triggered using an action plan format that is not reflected in the computer generated alphabetically organized AVS.    Please see AVS for specific instructions unique to this visit that I personally wrote and verbalized to the the pt in detail and then reviewed with pt  by my nurse highlighting any  changes in therapy recommended at today's visit to their plan of care.

## 2017-01-20 NOTE — Assessment & Plan Note (Signed)
Sinus CT 01/05/17 There is mucosal thickening within the frontal sinuses. There is opacification of many ethmoid air cells, and fluid and mucosal thickening is noted in both maxillary sinuses and within the partitions of the sphenoid sinus. These findings are consistent with pan sinusitis. 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

## 2017-01-27 ENCOUNTER — Encounter: Payer: Self-pay | Admitting: Neurology

## 2017-01-27 ENCOUNTER — Ambulatory Visit (INDEPENDENT_AMBULATORY_CARE_PROVIDER_SITE_OTHER): Payer: Medicare Other | Admitting: Neurology

## 2017-01-27 VITALS — Ht 75.0 in | Wt 200.2 lb

## 2017-01-27 DIAGNOSIS — H9311 Tinnitus, right ear: Secondary | ICD-10-CM | POA: Diagnosis not present

## 2017-01-27 DIAGNOSIS — R499 Unspecified voice and resonance disorder: Secondary | ICD-10-CM | POA: Diagnosis not present

## 2017-01-27 DIAGNOSIS — H905 Unspecified sensorineural hearing loss: Secondary | ICD-10-CM

## 2017-01-27 DIAGNOSIS — J305 Allergic rhinitis due to food: Secondary | ICD-10-CM | POA: Diagnosis not present

## 2017-01-27 DIAGNOSIS — H538 Other visual disturbances: Secondary | ICD-10-CM | POA: Diagnosis not present

## 2017-01-27 DIAGNOSIS — I200 Unstable angina: Secondary | ICD-10-CM

## 2017-01-27 DIAGNOSIS — R2689 Other abnormalities of gait and mobility: Secondary | ICD-10-CM

## 2017-01-27 DIAGNOSIS — H9041 Sensorineural hearing loss, unilateral, right ear, with unrestricted hearing on the contralateral side: Secondary | ICD-10-CM | POA: Diagnosis not present

## 2017-01-27 DIAGNOSIS — H9191 Unspecified hearing loss, right ear: Secondary | ICD-10-CM | POA: Diagnosis not present

## 2017-01-27 DIAGNOSIS — H9190 Unspecified hearing loss, unspecified ear: Secondary | ICD-10-CM

## 2017-01-27 DIAGNOSIS — J040 Acute laryngitis: Secondary | ICD-10-CM | POA: Diagnosis not present

## 2017-01-27 DIAGNOSIS — H539 Unspecified visual disturbance: Secondary | ICD-10-CM | POA: Diagnosis not present

## 2017-01-27 MED ORDER — OXCARBAZEPINE ER 300 MG PO TB24: 300.0000 mg | ORAL_TABLET | Freq: Every day | ORAL | 12 refills | Status: DC

## 2017-01-27 NOTE — Patient Instructions (Signed)
Remember to drink plenty of fluid, eat healthy meals and do not skip any meals. Try to eat protein with a every meal and eat a healthy snack such as fruit or nuts in between meals. Try to keep a regular sleep-wake schedule and try to exercise daily, particularly in the form of walking, 20-30 minutes a day, if you can.   As far as your medications are concerned, I would like to suggest: Oxtellar XR (Oxcarbazepine or commonly Trileptal) 300mg  a day. May increase to 600mg  if needed.  As far as diagnostic testing: MRI brain w/wo contrast  I would like to see you back in 8 weeks, sooner if we need to. Please call us with any interim questions, concerns, problems, updates or refill requests.   Our phone number is [phone]. We also have an after hours call service for urgent matters and there is a physician on-call for urgent questions. For any emergencies you know to call 911 or go to the nearest emergency room  Oxcarbazepine extended-release tablets  What should I tell my health care provider before I take this medicine? They need to know if you have any of these conditions: -Asian ancestry -kidney disease -liver disease -suicidal thoughts, plans, or attempt; a previous suicide attempt by you or a family member -any unusual or allergic reaction to oxcarbazepine, carbamazepine, other medicines, foods, dyes, or preservatives -pregnant or trying to get pregnant -breast-feeding How should I use this medicine? Take this medicine by mouth with a glass of water. Follow the directions on the prescription label. Take this medicine on an empty stomach, at least 1 hour before or 2 hours after food. Do not take with food. Do not cut, crush, or chew this medicine. Take your doses at regular intervals. Do not take your medicine more often than directed. Do not stop taking except on your doctor's advice. A special MedGuide will be given to you by the pharmacist with each prescription and refill. Be sure to  read this information carefully each time. Talk to your pediatrician regarding the use of this medicine in children. While this medicine may be prescribed for children as young as 6 years for selected conditions, precautions do apply. What if I miss a dose? If you miss a dose, take it as soon as you can. If it is almost time for your next dose, take only that dose. Do not take double or extra doses. What may interact with this medicine? Do not take this medicine with any of the following medications: -carbamazepine This medicine may also interact with the following medications: -birth control pills -certain medicines for seizures like phenobarbital, phenytoin, valproic acid -certain medicines for high blood pressure like felodipine, diltiazem, verapamil -cyclosporine What should I watch for while using this medicine? Tell your doctor or healthcare professional if your symptoms do not start to get better or if they get worse. Visit your doctor or health care professional for regular checks on your progress. Do not stop taking except on your doctor's advice. This increases the risk of seizures. Your doctor will tell you how much medicine to take. Wear a medical ID bracelet or chain, and carry a card that describes your disease and details of your medicine and dosage times. Rarely, serious skin allergic reactions may occur with this medicine. If you develop a skin rash, redness, itching, peeling skin inside your mouth, swollen glands, or a fever while taking this medicine, contact your health care provider immediately. You may get drowsy or dizzy. Do not  drive, use machinery, or do anything that needs mental alertness until you know how this medicine affects you. Do not stand or sit up quickly, especially if you are an older patient. This reduces the risk of dizzy or fainting spells. Alcohol may interfere with the effects of this medicine. Avoid alcoholic drinks. Birth control pills may not work  properly while you are taking this medicine. Talk to your doctor about using an extra method of birth control. The use of this medicine may increase the chance of suicidal thoughts or actions. Pay special attention to how you are responding while on this medicine. Any worsening of mood, or thoughts of suicide or dying should be reported to your health care professional right away. Women who become pregnant while using this medicine may enroll in the [place] Antiepileptic Drug Pregnancy Registry by calling [phone]. This registry collects information about the safety of antiepileptic drug use during pregnancy. What side effects may I notice from receiving this medicine? Side effects that you should report to your doctor or health care professional as soon as possible: -allergic reactions like skin rash, itching or hives, swelling of the face, lips, or tongue -changes in vision -confusion -fever -infection -problems with balance, speaking, walking -redness, blistering, peeling or loosening of the skin, including inside the mouth -swelling of ankles, feet, hands -suicidal thoughts or other mood changes -trouble passing urine or change in the amount of urine -unusual bleeding or bruising -unusually weak or tired -yellowing of eyes or skin Side effects that usually do not require medical attention (report to your doctor or health care professional if they continue or are bothersome): -constipation -diarrhea -headache -loss of appetite -nausea, vomiting -nervous -tremors -trouble sleeping Where should I keep my medicine? Keep out of the reach of children. Store at room temperature between 15 and 30 degrees C (59 and 86 degrees F). Keep container tightly closed. Throw away any unused medicine after the expiration date.  2017 Elsevier/Gold Standard (2016-01-02 09:23:47)

## 2017-01-27 NOTE — Progress Notes (Addendum)
[facility]    Provider:  Dr Lucia GaskinsAhern Referring Provider: Delorse LekBurnett, Brent A, MD Primary Care Physician:  No PCP Per Patient  CC:  Hearing and vision changes  HPI:  Louis Perez is a 66 y.o. male here as a referral from Dr. Doristine CounterBurnett for hearing and vision changes. Past medical history of high cholesterol, heart disease, hypertension, total knee replacements, sinus surgery. He has sizzling in the right ear brief, a few seconds then resolves. He hears clicking. A hissing noise. It happens every day. He also has eye symptoms associated feels his eyes flutter during the ear nose (happened today and I couldn't see it on exam), moving his head to the right improves the symptoms. The eye sensation is a fluttering sensation and vision changes. Symptoms started in 2013 or earlier. Talking makes it worse and noise does too. His eyes don't move and his lids dont close he just feels like his vision is impaired during those 5 seconds of the noise in the right ear. He has been evaluated extensively by ENT and had his eyes checked without etiology. Noise exacerbates symptoms and can be triggered by noise. Something crunchy can cause it as well when he chews. Symptoms happens on and off all day long. No one can notice it.the vision changes when they look at him but they make him feel unsteady. No dizziness or lightheadedness. Varies in intensity.  More frequent, progressive for 5 years. Noise or sound is a trigger. No other focal neurologic deficits, associated symptoms, inciting events or modifiable factors.  Reviewed notes, labs and imaging from outside physicians, which showed:  Reviewed notes from Dr. Fayrene FearingJames crossly. Patient was seen by neurology to go and placed on Trileptal for sharp pain behind his right ear. He has had several episodes a day. This led sometimes flutter. He tried Lyrica but developed a rash. Tilting his head to the right improves the discomfort he [facility] 2012 showed a small bony spurring K. Diminutive right vertebral artery, large left vertebral artery, basilar artery pressing the medulla on the right aspect of the pons. His problems tend to be returning in terms of ENT where he had a recent CAT scan which showed mucosal thickening within the frontal sinus opacification of some of the ethmoid air cells, fluid or mucosal thickening noted in the maxillary sinuses bilateral sinus partially consistent with sinusitis. He was sent to neurology for evaluation of eye issue, visual interruption that he complains about where. His eyelids flicker, affecting his vision someone, and constant ticking and hissing sensation in his right ear.  Reviewed neurologist notes Dr. love from 2013. At that time is complaining of bilateral tinnitus. In October 2011 he developed a sound in his right ear. He initially hears a chirping sound followed by a frying, cracking or popping sounds like water on hot grease. This can be associated with imbalance and eye fluttering sensation, squinting of his eyes and tightness in the right side of his face and head. It lasts about 5 seconds and then will recur in 30 seconds. It was on and off through January 2012. In January 2012 he was bitten by a tick in one month later he woke with a fever of 103 and was [facility]. MRI of the brain in 2012 with and without contrast showed no internal auditory canal enhancement, asymmetry of medical St., small bony spur in the right Meckel's Jae DireKate, diminutive right vertebral artery, large  left vertebral artery and basilar artery pressing the medulla and right aspect. He had surgery in 6 weeks of IV vancomycin for his sinus disease during which time the sound, visual disturbance, imbalance and tightness of the right side improved. He recurred in July 2012 and was worse than ever. He cannot drive a car. He has a crick in sound in his right ear and sharp pain in the right side of his  face which he treats with goody powders. Symptoms worse if he lays on his side. It is better if he lays on his right side. Symptoms decrease if he leans his head to the right or opens his mouth and tilts his head to the right. He can of sharp pain above his right eye. He was seeing Dr. Haroldine Laws at the time and audiogram was normal and CT of the temporal bones without contrast was normal except for sinus he was placed on Trileptal with good results. Dr. love reportedly thought that his symptoms involve at least 2 cranial nerves and possibly the cerebellum. There are mechanical features to his symptoms such as tilting his head to the right that improves the discomfort. He had no episodes during exam. Trileptal 150 mg twice daily was continued.  Review of Systems: Patient complains of symptoms per HPI as well as the following symptoms: Blurred vision, wheezing, ringing in ears, allergies, runny nose, snoring. Pertinent negatives per HPI. All others negative.   Social History   Social History  . Marital status: Married    Spouse name: N/A  . Number of children: 2  . Years of education: 14   Occupational History  . retired Architect, Public affairs consultant work    Social History Main Topics  . Smoking status: Former Smoker    Packs/day: 1.00    Years: 5.00    Types: Cigarettes    Quit date: 12/14/1968  . Smokeless tobacco: Never Used  . Alcohol use 0.6 oz/week    1 Cans of beer per week     Comment: Occasional  . Drug use: No  . Sexual activity: Not on file     Comment: Married   Other Topics Concern  . Not on file   Social History Narrative   Patient lives at home with his wife . He is retired. Patient has 14 years of education. Patient has two children.   Caffeine - two cups daily.   Right-handed       Family History  Problem Relation Age of Onset  . Dementia Father   . Alzheimer's disease Father   . Heart disease Mother     Onset-Late 27s  . Heart disease Brother 81     Past Medical History:  Diagnosis Date  . Arthritis    "hips, lower back, knees, shoulders, hands" (05/14/2016)  . Asthma dx'd in 2014  . Bacterial septicemia (HCC) 01/2011   "both knee involved"  . CAD (coronary artery disease)    a. NSTEMI 02/2016 s/p DES to LAD. b. Relook cath due to angina/prior high risk anatomy 05/14/16 -> continued medical therapy given concern that a stent strut could inferere with Cx itself.  Marland Kitchen GERD (gastroesophageal reflux disease)   . Gilbert's disease   . Hyperlipidemia   . Hypertension   . 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 NSTEMI (non-ST elevated myocardial infarction) (HCC) 02/2016   [name] 03/02/2016  . Sinus bradycardia   . Unspecified tinnitus    right    Past Surgical History:  Procedure Laterality Date  . BACK SURGERY    . CARDIAC CATHETERIZATION N/A 03/01/2016   Procedure: Left Heart Cath and Coronary Angiography;  Surgeon: Tonny Bollman, MD;  oLAD 95%, CFX/RI/RCA/RPDA 25-30%  . CARDIAC CATHETERIZATION N/A 03/01/2016   Procedure: Coronary Stent Intervention;  Surgeon: Tonny Bollman, MD;  Promus Premier 4.0 x  12 mm DES to oLAD  . CARDIAC CATHETERIZATION N/A 05/14/2016   Procedure: Left Heart Cath and Coronary Angiography;  Surgeon: Lennette Bihari, MD;  Location: [facility] INVASIVE CV LAB;  Service: Cardiovascular;  Laterality: N/A;  . CARDIAC CATHETERIZATION N/A 06/17/2016   Procedure: Intravascular Pressure Wire/FFR Study;  Surgeon: Tonny Bollman, MD;  Location: [facility] INVASIVE CV LAB;  Service: Cardiovascular;  Laterality: N/A;  . CARDIOVASCULAR STRESS TEST  05/26/2012   EKG negative for ischemia, no ECG changes, no significant ischemia noted  . I&D KNEE WITH POLY EXCHANGE Bilateral 01/2011   & complete synovectomy of 3 compartments of the right total knee/notes 02/04/2011 (replacement,partial knee replacement because of bacterial infection)  . JOINT REPLACEMENT    . KNEE ARTHROSCOPY Right ~ 2002  . LUMBAR DISC SURGERY  ~ 1980; 2000  . NASAL SINUS  SURGERY  08/2011  . SHOULDER ARTHROSCOPY W/ ROTATOR CUFF REPAIR Right 01/2015  . TOTAL KNEE ARTHROPLASTY Bilateral 03/26/2004-09/15/2004    right-left  . TRANSTHORACIC ECHOCARDIOGRAM  05/26/2012   EF >55%, mild concentric LVH    Current Outpatient Prescriptions  Medication Sig Dispense Refill  . albuterol (PROAIR HFA) 108 (90 BASE) MCG/ACT inhaler 2 puffs every 4 hours as needed only  if your can't catch your breath (Patient taking differently: Inhale 2 puffs into the lungs every 4 (four) hours as needed for shortness of breath. ) 1 Inhaler 1  . aspirin 81 MG tablet Take 81 mg by mouth at bedtime.     Marland Kitchen atorvastatin (LIPITOR) 80 MG tablet Take 1 tablet (80 mg total) by mouth daily at 6 PM. 30 tablet 30  . diphenhydrAMINE (BENADRYL) 25 MG tablet Take 25 mg by mouth every 6 (six) hours as needed for allergies.    . fluticasone (FLONASE) 50 MCG/ACT nasal spray Place 2 sprays into both nostrils daily.    . Garlic 1000 MG CAPS Take 1,000 mg by mouth daily.     . isosorbide mononitrate (IMDUR) 60 MG 24 hr tablet Take 1 tablet (60 mg total) by mouth 2 (two) times daily. 60 tablet 11  . losartan (COZAAR) 50 MG tablet Take 0.5 tablets (25 mg total) by mouth daily. (Patient taking differently: Take 50 mg by mouth daily. )    . Multiple Vitamin (MULTIVITAMIN WITH MINERALS) TABS tablet Take 1 tablet by mouth daily.    . nitroGLYCERIN (NITROSTAT) 0.4 MG SL tablet Place 1 tablet (0.4 mg total) under the tongue every 5 (five) minutes x 3 doses as needed for chest pain. 25 tablet 12  . omeprazole (PRILOSEC) 40 MG capsule Take 1 capsule (40 mg total) by mouth 2 (two) times daily. 60 capsule 2  . RANEXA 1000 MG SR tablet TAKE 1 TABLET (1,000 MG TOTAL) BY MOUTH 2 (TWO) TIMES DAILY. 60 tablet 1  . ticagrelor (BRILINTA) 90 MG TABS tablet Take 1 tablet (90 mg total) by mouth 2 (two) times daily. 60 tablet 11  . OXcarbazepine ER (OXTELLAR XR) 300 MG TB24 Take 300 mg by mouth at bedtime. 30 tablet 12  . SYMBICORT 160-4.5  MCG/ACT inhaler   10   No current facility-administered medications for this visit.  Allergies as of 01/27/2017 - Review Complete 01/27/2017  Allergen Reaction Noted  . Oxycontin [oxycodone hcl] Anaphylaxis 06/02/2013  . Percocet [oxycodone-acetaminophen] Anaphylaxis 07/06/2013  . Ambien [zolpidem tartrate] Other (See Comments) 07/06/2013    Vitals: Ht 6\' 3"  (1.905 m)   Wt 200 lb 3.2 oz (90.8 kg)   BMI 25.02 kg/m  Last Weight:  Wt Readings from Last 1 Encounters:  01/27/17 200 lb 3.2 oz (90.8 kg)   Last Height:   Ht Readings from Last 1 Encounters:  01/27/17 6\' 3"  (1.905 m)   Physical exam: Exam: Gen: NAD, conversant, well nourised, obese, well groomed                     CV: RRR, no MRG. No Carotid Bruits. No peripheral edema, warm, nontender Eyes: Conjunctivae clear without exudates or hemorrhage  Neuro: Detailed Neurologic Exam  Speech:    Speech is normal; fluent and spontaneous with normal comprehension.  Cognition:    The patient is oriented to person, place, and time;     recent and remote memory intact;     language fluent;     normal attention, concentration,     fund of knowledge Cranial Nerves:    The pupils are equal, round, and reactive to light. The fundi are normal and spontaneous venous pulsations are present. Visual fields are full to finger confrontation. Extraocular movements are intact. Trigeminal sensation is intact and the muscles of mastication are normal. The face is symmetric. The palate elevates in the midline. Hearing intact. Voice is normal. Shoulder shrug is normal. The tongue has normal motion without fasciculations.   Coordination:    Normal finger to nose and heel to shin. Normal rapid alternating movements.   Gait:    Heel-toe and tandem gait are normal.   Motor Observation:    No asymmetry, no atrophy, and no involuntary movements noted. Tone:    Normal muscle tone.    Posture:    Posture is normal. normal erect     Strength:    Strength is V/V in the upper and lower limbs.      Sensation: intact to LT     Reflex Exam:  DTR's:    Deep tendon reflexes in the upper and lower extremities are normal bilaterally.   Toes:    The toes are downgoing bilaterally.   Clonus:    Clonus is absent.     Assessment/Plan:  66 year old with episodes of hearing noises associated with vision changes and imbalance of unclear etiology triggered by loud noises. Its possible there is a vestibulo-occular etiology, as Imbalance and vision changes has been documented to be triggered by loud noises in certain conditions, causing changes in ear pressure and endolymph movement with resultant triggering of the vestibular system with eye oscillopsia. (as an example  Tullio phenomenon which refers to sound-induced disequilibrium or oscillopsia, Tullio patients describe disequilibrium, auditory and visual symptoms, which are recurrent, brief, and often triggered by loud noises or middle ear pressure changes. There have been cases of this phenomenon without superior canal dehiscence:  Neurol. 2012 Jan;259(1):4-21. 2011 Jul 9. The Tullio phenomenon: a neurologically neglected presentation. [place] D1, [address] [place] L, [place] AM, [place] P.).    MRI brain w/wo contrast w thin cuts through the IACs to rule out other causes of symptoms including vestibular lesions Previously documented in 2013 that Trileptal helped with symptoms. Will try this again (Oxtellar XR) will start at 300mg /day and can increase to 600mg /day  as needed. F/u 8 - 10 weeks  Cc: Orlando Penner, MD  [facility] [address] [place], [place] [address]  Phone [phone] Fax [phone]

## 2017-01-29 ENCOUNTER — Telehealth: Payer: Self-pay | Admitting: Internal Medicine

## 2017-01-29 NOTE — Telephone Encounter (Signed)
Please call,question his Losartan.

## 2017-01-29 NOTE — Telephone Encounter (Signed)
Spoke with pt, he is having wheezing esp at night and then other times as well. He has researched and losartan can cause wheezing. He would like to know if there is something else he can take. Will forward for dr hilty's review and advise.

## 2017-01-31 NOTE — Telephone Encounter (Signed)
I think it is unlikely to be losartan - but he is only on a low dose of 25 mg daily. BP has been low normal. I would advise stopping the medication and making no other medication changes to see if symptoms improve.  Dr. HRexene Edison

## 2017-02-01 NOTE — Telephone Encounter (Signed)
left message for patient of dr hilty's recommendations.

## 2017-02-09 ENCOUNTER — Ambulatory Visit
Admission: RE | Admit: 2017-02-09 | Discharge: 2017-02-09 | Disposition: A | Discharge status: Home / Self Care | Payer: Medicare Other | Source: Ambulatory Visit | Attending: Neurology | Admitting: Neurology

## 2017-02-09 ENCOUNTER — Encounter: Payer: Self-pay | Admitting: Allergy and Immunology

## 2017-02-09 ENCOUNTER — Encounter (INDEPENDENT_AMBULATORY_CARE_PROVIDER_SITE_OTHER): Payer: Self-pay

## 2017-02-09 ENCOUNTER — Ambulatory Visit (INDEPENDENT_AMBULATORY_CARE_PROVIDER_SITE_OTHER): Payer: Medicare Other | Admitting: Allergy and Immunology

## 2017-02-09 VITALS — BP 118/88 | HR 80 | Temp 97.7°F | Resp 18 | Ht 73.82 in | Wt 197.8 lb

## 2017-02-09 DIAGNOSIS — K219 Gastro-esophageal reflux disease without esophagitis: Secondary | ICD-10-CM

## 2017-02-09 DIAGNOSIS — H9190 Unspecified hearing loss, unspecified ear: Secondary | ICD-10-CM

## 2017-02-09 DIAGNOSIS — J324 Chronic pansinusitis: Secondary | ICD-10-CM

## 2017-02-09 DIAGNOSIS — I200 Unstable angina: Secondary | ICD-10-CM

## 2017-02-09 DIAGNOSIS — D721 Eosinophilia, unspecified: Secondary | ICD-10-CM

## 2017-02-09 DIAGNOSIS — H538 Other visual disturbances: Secondary | ICD-10-CM

## 2017-02-09 DIAGNOSIS — H539 Unspecified visual disturbance: Secondary | ICD-10-CM

## 2017-02-09 DIAGNOSIS — J984 Other disorders of lung: Secondary | ICD-10-CM | POA: Diagnosis not present

## 2017-02-09 DIAGNOSIS — R2689 Other abnormalities of gait and mobility: Secondary | ICD-10-CM

## 2017-02-09 DIAGNOSIS — J4540 Moderate persistent asthma, uncomplicated: Secondary | ICD-10-CM | POA: Diagnosis not present

## 2017-02-09 DIAGNOSIS — J3089 Other allergic rhinitis: Secondary | ICD-10-CM

## 2017-02-09 DIAGNOSIS — H9191 Unspecified hearing loss, right ear: Secondary | ICD-10-CM

## 2017-02-09 MED ORDER — AMOXICILLIN-POT CLAVULANATE 875-125 MG PO TABS: ORAL_TABLET | ORAL | 0 refills | Status: DC

## 2017-02-09 MED ORDER — GADOBENATE DIMEGLUMINE 529 MG/ML IV SOLN
19.0000 mL | Freq: Once | INTRAVENOUS | Status: AC | PRN
  Administered 2017-02-09: 19 mL via INTRAVENOUS

## 2017-02-09 MED ORDER — MONTELUKAST SODIUM 10 MG PO TABS: 10.0000 mg | ORAL_TABLET | Freq: Every day | ORAL | 5 refills | Status: DC

## 2017-02-09 MED ORDER — RANITIDINE HCL 300 MG PO TABS: ORAL_TABLET | ORAL | 5 refills | Status: DC

## 2017-02-09 MED ORDER — NYSTATIN 100000 UNIT/ML MT SUSP: OROMUCOSAL | 0 refills | Status: DC

## 2017-02-09 NOTE — Progress Notes (Signed)
Dear Dr. Haroldine Lawsrossley,  Thank you for referring Louis Perez to the [facility] [place] on 02/09/2017.   Below is a summation of this patient's evaluation and recommendations.  Thank you for your referral. I will keep you informed about this patient's response to treatment.   If you have any questions please do not hesitate to contact me.   Sincerely,  Jessica PriestEric J. Kozlow, MD Allergy / Immunology [facility]   ______________________________________________________________________    NEW PATIENT NOTE  Referring Provider: Keturah Barrerossley, James J, MD Primary Provider: No PCP Per Patient Date of office visit: 02/09/2017    Subjective:   Chief Complaint:  Louis Perez (DOB: [birth year]) is a 66 y.o. male who presents to the clinic on 02/09/2017 with a chief complaint of Asthma .     HPI: Louis Perez presents to this clinic in evaluation of breathing problems. He appears to have several distinct issues.   First, he complains of having air hunger and getting out of breath and having rather significant shortness of breath if he exerts himself to any degree. He stopped exercising in December 2017 because he just had no air. His history of breathing problems dates back several years and he has seen Dr. Sherene SiresWert in 2017 who gave him a steroid injection in August 2017 with resolution of his breathing problems for several months. He then developed another episode of breathing problems in January and was given systemic steroids with both prednisone and an injection and he didn't do as well as he had in the past. He has very little coughing but he does have constant throat clearing and a glob stuck in his throat and some intermittent choking and hoarseness. He may have intermittent wheezing as well and this wheezing appears to be during inspiration. He is not sure that the administration of a short-acting bronchodilator helps him  to any degree but he does think that the consistent use of Symbicort which is been prescribed the past 3 months by Dr. Sherene SiresWert has helped him to some degree. There is no obvious provoking factor giving rise to this issue. He thought initially that this may have been secondary to mold exposure in his house so 3 years ago he put in a new heating system with all new ductwork and had his crawl space sealed against moisture infiltration and placed a dehumidifier down in that area.  Second, he has had problems with his upper airways. He is undergone sinus surgery with Dr. Haroldine Lawsrossley approximately 5 years ago which apparently did help his nasal congestion. He does not have any anosmia and he does not have any ugly nasal discharge although on occasion he will have some intermittent green yellow nasal discharge. He does have some intermittent sneezing. There is no obvious provoking factor giving rise to this issue.  Third, he has reflux disease. If he uses his omeprazole twice a day he really has no problems with reflux. He continues to drink caffeine with 2 large glasses of tea per day and without any chocolate or alcohol consumption. He does have chest pain in his sternal region on a daily basis.   Fourth, he appears to develop a issue with tinnitus affecting his right ear more so than his left in conjunction with a visual flickering sensation and occasionally a right-sided headache for which she saw neurology 5 years ago and is scheduled to have a repeat CT scan of his head  today.  Past Medical History:  Diagnosis Date  . Arthritis    "hips, lower back, knees, shoulders, hands" (05/14/2016)  . Asthma dx'd in 2014  . Bacterial septicemia (HCC) 01/2011   "both knee involved"  . CAD (coronary artery disease)    a. NSTEMI 02/2016 s/p DES to LAD. b. Relook cath due to angina/prior high risk anatomy 05/14/16 -> continued medical therapy given concern that a stent strut could inferere with Cx itself.  Marland Kitchen GERD  (gastroesophageal reflux disease)   . Gilbert's disease   . Hyperlipidemia   . Hypertension   . 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 NSTEMI (non-ST elevated myocardial infarction) (HCC) 02/2016   [name] 03/02/2016  . Sinus bradycardia   . Unspecified tinnitus    right    Past Surgical History:  Procedure Laterality Date  . BACK SURGERY    . CARDIAC CATHETERIZATION N/A 03/01/2016   Procedure: Left Heart Cath and Coronary Angiography;  Surgeon: Tonny Bollman, MD;  oLAD 95%, CFX/RI/RCA/RPDA 25-30%  . CARDIAC CATHETERIZATION N/A 03/01/2016   Procedure: Coronary Stent Intervention;  Surgeon: Tonny Bollman, MD;  Promus Premier 4.0 x  12 mm DES to oLAD  . CARDIAC CATHETERIZATION N/A 05/14/2016   Procedure: Left Heart Cath and Coronary Angiography;  Surgeon: Lennette Bihari, MD;  Location: [facility] INVASIVE CV LAB;  Service: Cardiovascular;  Laterality: N/A;  . CARDIAC CATHETERIZATION N/A 06/17/2016   Procedure: Intravascular Pressure Wire/FFR Study;  Surgeon: Tonny Bollman, MD;  Location: [facility] INVASIVE CV LAB;  Service: Cardiovascular;  Laterality: N/A;  . CARDIOVASCULAR STRESS TEST  05/26/2012   EKG negative for ischemia, no ECG changes, no significant ischemia noted  . I&D KNEE WITH POLY EXCHANGE Bilateral 01/2011   & complete synovectomy of 3 compartments of the right total knee/notes 02/04/2011 (replacement,partial knee replacement because of bacterial infection)  . JOINT REPLACEMENT    . KNEE ARTHROSCOPY Right ~ 2002  . LUMBAR DISC SURGERY  ~ 1980; 2000  . NASAL SINUS SURGERY  08/2011  . SHOULDER ARTHROSCOPY W/ ROTATOR CUFF REPAIR Right 01/2015  . TOTAL KNEE ARTHROPLASTY Bilateral 03/26/2004-09/15/2004    right-left  . TRANSTHORACIC ECHOCARDIOGRAM  05/26/2012   EF >55%, mild concentric LVH    Allergies as of 02/09/2017      Reactions   Oxycontin [oxycodone Hcl] Anaphylaxis   Percocet [oxycodone-acetaminophen] Anaphylaxis   Tolerates Vicodin   Ambien [zolpidem Tartrate] Other (See Comments)    Mood changes       Medication List      albuterol 108 (90 Base) MCG/ACT inhaler Commonly known as:  PROAIR HFA 2 puffs every 4 hours as needed only  if your can't catch your breath   aspirin 81 MG tablet Take 81 mg by mouth at bedtime.   atorvastatin 80 MG tablet Commonly known as:  LIPITOR Take 1 tablet (80 mg total) by mouth daily at 6 PM.   diphenhydrAMINE 25 MG tablet Commonly known as:  BENADRYL Take 25 mg by mouth every 6 (six) hours as needed for allergies.   fluticasone 50 MCG/ACT nasal spray Commonly known as:  FLONASE Place 2 sprays into both nostrils daily.   Garlic 1000 MG Caps Take 1,000 mg by mouth daily.   GOODY HEADACHE PO Take by mouth as needed.   isosorbide mononitrate 60 MG 24 hr tablet Commonly known as:  IMDUR   losartan 50 MG tablet Commonly known as:  COZAAR   multivitamin with minerals Tabs tablet Take 1 tablet by mouth daily.  nitroGLYCERIN 0.4 MG SL tablet Commonly known as:  NITROSTAT Place 1 tablet (0.4 mg total) under the tongue every 5 (five) minutes x 3 doses as needed for chest pain.   nystatin 100000 UNIT/ML suspension Commonly known as:  MYCOSTATIN Swish and spit 5 mls three times daily for 20 days   omeprazole 40 MG capsule Commonly known as:  PRILOSEC Take 1 capsule (40 mg total) by mouth 2 (two) times daily.   OXcarbazepine ER 300 MG Tb24 Commonly known as:  OXTELLAR XR Take 300 mg by mouth at bedtime.   RANEXA 1000 MG SR tablet Generic drug:  ranolazine TAKE 1 TABLET (1,000 MG TOTAL) BY MOUTH 2 (TWO) TIMES DAILY.   SYMBICORT 160-4.5 MCG/ACT inhaler Generic drug:  budesonide-formoterol   ticagrelor 90 MG Tabs tablet Commonly known as:  BRILINTA Take 1 tablet (90 mg total) by mouth 2 (two) times daily.       Review of systems negative except as noted in HPI / PMHx or noted below:  Review of Systems  Constitutional: Negative.   HENT: Negative.   Eyes: Negative.   Respiratory: Negative.   Cardiovascular:  Negative.   Gastrointestinal: Negative.   Genitourinary: Negative.   Musculoskeletal: Negative.   Skin: Negative.   Neurological: Negative.   Endo/Heme/Allergies: Negative.   Psychiatric/Behavioral: Negative.     Family History  Problem Relation Age of Onset  . Dementia Father   . Alzheimer's disease Father   . Heart disease Mother     Onset-Late 4s  . Heart disease Brother 19  . Cancer Paternal Aunt   . Cancer Paternal Uncle   . Cancer Paternal Uncle   . Cancer Paternal Uncle     Social History   Social History  . Marital status: Married    Spouse name: N/A  . Number of children: 2  . Years of education: 14   Occupational History  . retired Architect, Public affairs consultant work    Social History Main Topics  . Smoking status: Former Smoker    Packs/day: 1.00    Years: 5.00    Types: Cigarettes    Quit date: 12/14/1968  . Smokeless tobacco: Never Used  . Alcohol use 0.6 oz/week    1 Cans of beer per week     Comment: Occasional  . Drug use: No  . Sexual activity: Not on file     Comment: Married   Other Topics Concern  . Not on file   Social History Narrative   Patient lives at home with his wife . He is retired. Patient has 14 years of education. Patient has two children.   Caffeine - two cups daily.   Right-handed       Environmental and Social history  Lives in a house with a dry environment, a dog located inside the household, no carpeting in the bedroom, plastic on the bed but not the pillow, and no smoking ongoing with inside the household. 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  Objective:   Vitals:   02/09/17 0929  BP: 118/88  Pulse: 80  Resp: 18  Temp: 97.7 F (36.5 C)   Height: 6' 1.82" (187.5 cm) Weight: 197 lb 12.8 oz (89.7 kg)  Physical Exam  Constitutional: He is well-developed, well-nourished, and in no distress.  Raspy voice  HENT:  Head: Normocephalic. Head is without right periorbital erythema and  without left periorbital erythema.  Right Ear: Tympanic membrane, external ear and ear canal normal.  Left  Ear: Tympanic membrane, external ear and ear canal normal.  Nose: Nose normal. No mucosal edema or rhinorrhea.  Mouth/Throat: Oropharynx is clear and moist and mucous membranes are normal. No oropharyngeal exudate (Thrush).  Eyes: Conjunctivae and lids are normal. Pupils are equal, round, and reactive to light.  Neck: Trachea normal. No tracheal deviation present. No thyromegaly present.  Cardiovascular: Normal rate, regular rhythm, S1 normal, S2 normal and normal heart sounds.   No murmur heard. Pulmonary/Chest: Effort normal. No stridor. No tachypnea. No respiratory distress. He has no wheezes. He has no rales. He exhibits no tenderness.  Abdominal: Soft. He exhibits no distension and no mass. There is no hepatosplenomegaly. There is no tenderness. There is no rebound and no guarding.  Musculoskeletal: He exhibits no edema or tenderness.  Lymphadenopathy:       Head (right side): No tonsillar adenopathy present.       Head (left side): No tonsillar adenopathy present.    He has no cervical adenopathy.    He has no axillary adenopathy.  Neurological: He is alert. Gait normal.  Skin: No rash noted. He is not diaphoretic. No erythema. No pallor. Nails show no clubbing.  Psychiatric: Mood and affect normal.    Diagnostics: Allergy skin tests were performed. He demonstrated hypersensitivity against mold.  Spirometry was performed and demonstrated an FEV1 of 2.42 @ 59 % of predicted. Following the administration of nebulized albuterol his FEV1 rose to 2.51 which was an increase of 4%.  Results of a chest x-ray obtained 02/29/2016 identified the following:  The heart size and mediastinal contours are within normal limits. Both lungs are clear. The visualized skeletal structures are Unremarkable.  Results of a chest CT scan obtained on 03/30/2004 identified the following:  Pulmonary  arterial tree is negative for evidence of emboli. The patient has dependent pulmonary atelectasis in both lower lobes, more on the right than on the left. There is patchy infiltrate in the right upper lobe consistent with pneumonia. No pleural fluid accumulation. No pericardial fluid. No evidence of adenopathy or mass.    Results of a head CT scan obtained on 01/05/2017 identified the following:  There is mucosal thickening within the frontal sinuses. There is opacification of many ethmoid air cells, and fluid and mucosal thickening is noted in both maxillary sinuses and within the partitions of the sphenoid sinus. These findings are consistent with pan sinusitis. The nasal turbinates are normal in size and position and the nasal airway is patent. The olfactory recesses are patent. No soft tissue abnormality is seen. No intracranial abnormality is evident on this limited exam.  Results of blood tests obtained on 29 December 2016 identified a white blood cell count of 6.1 with an eosinophil count of 1200, hemoglobin 16.6, platelet 226 and a aero allergen hypersensitivity profile without any evidence of antigen specific IgE antibodies. His total IgE level was 84KU/L.  Assessment and Plan:    1. Asthma, moderate persistent, well-controlled   2. Restrictive lung disease   3. Chronic pansinusitis   4. Other allergic rhinitis   5. LPRD (laryngopharyngeal reflux disease)   6. Eosinophilia     1. Allergen avoidance measures  2. Treat and prevent inflammation:   A. Symbicort 160 - 2 inhalations twice a day with spacer  B. Flonase - one spray each nostril twice per day  C. montelukast 10 mg - one tablet one time per day  3. Treat infection:   A. Augmentin 875 one tablet twice a day for the  next 20 days  B. nystatin oral solution 5 ML's swish and spit 3 times a day for 20 days  4. Treat reflux:   A. consolidate all caffeine consumption and aim for none  B. continue omeprazole 40 mg  twice a day  C. add ranitidine 300 mg in evening  5. If needed:   A. ProAir HFA 2 puffs every 4-6 hours  B. nasal saline  C. OTC antihistamine - Claritin 10 mg daily  6. Blood - IgA/G/M, anti-pneumococcal antibodies, antitetanus antibodies  7. Pulmonary function testing with full lung volumes and DLCO  8. Return to clinic in 3 weeks or earlier if problem  Things just don't add up with Louis Perez and his complaints and his objective findings. He could have obstructive lung disease but his spirometry treat today would suggest that he obviously has a restrictive lung disease and to define which of the 2 is playing the greater role were going to obtain full pulmonary function tests with lung volumes and DLCO. As well, he appears to have chronic sinusitis based upon his CT scan findings in January which always raises the possibility that he may have a inadequate functioning immune system directed against respiratory pathogens and we'll investigate that issue with blood tests as noted above. For now he'll use anti-inflammatory agents for his respiratory tract, more aggressive therapy directed against his reflux, and prolonged antibiotic administration as well as therapy directed against thrush. I'll see him back in this clinic in 3 weeks or earlier if there is a problem.  Jessica Priest, MD [facility] [place]

## 2017-02-09 NOTE — Patient Instructions (Addendum)
  1. Allergen avoidance measures  2. Treat and prevent inflammation:   A. Symbicort 160 - 2 inhalations twice a day with spacer  B. Flonase - one spray each nostril twice per day  C. montelukast 10 mg - one tablet one time per day  3. Treat infection:   A. Augmentin 875 one tablet twice a day for the next 20 days  B. nystatin oral solution 5 ML's swish and spit 3 times a day for 20 days  4. Treat reflux:   A. consolidate all caffeine consumption and aim for none  B. continue omeprazole 40 mg twice a day  C. add ranitidine 300 mg in evening  5. If needed:   A. ProAir HFA 2 puffs every 4-6 hours  B. nasal saline  C. OTC antihistamine - Claritin 10 mg daily  6. Blood - IgA/G/M, anti-pneumococcal antibodies, antitetanus antibodies  7. Pulmonary function testing with full lung volumes and DLCO  8. Return to clinic in 3 weeks or earlier if problem

## 2017-02-11 ENCOUNTER — Telehealth: Payer: Self-pay

## 2017-02-11 NOTE — Telephone Encounter (Signed)
Called pt w/ unremarkable MRI results. Verbalized understanding and appreciation for call. 

## 2017-02-11 NOTE — Telephone Encounter (Signed)
-----   Message from Anson FretAntonia B Ahern, MD sent at 02/11/2017 12:10 PM EST ----- Unremarkable MRI brain with thin cuts throug the internal auditory canals. No change from MRI on 01/27/11.

## 2017-02-17 LAB — PNEUMOCOCCAL IM (14 SEROTYPE)
PNEUMO AB TYPE 26 (6B): 8.2 ug/mL (ref >1.3)
PNEUMO AB TYPE 3: 1 ug/mL — AB (ref >1.3)
PNEUMO AB TYPE 4: 1.1 ug/mL — AB (ref >1.3)
PNEUMO AB TYPE 8: 1.2 ug/mL — AB (ref >1.3)
Pneumo Ab Type 1*: 0.1 ug/mL — ABNORMAL LOW (ref >1.3)
Pneumo Ab Type 12 (12F)*: 0.8 ug/mL — ABNORMAL LOW (ref >1.3)
Pneumo Ab Type 14*: 8.1 ug/mL (ref >1.3)
Pneumo Ab Type 19 (19F)*: 3.8 ug/mL (ref >1.3)
Pneumo Ab Type 23 (23F)*: >39.7 ug/mL (ref >1.3)
Pneumo Ab Type 51 (7F)*: 5.2 ug/mL (ref >1.3)
Pneumo Ab Type 56 (18C)*: 3.7 ug/mL (ref >1.3)
Pneumo Ab Type 57 (19A)*: >35 ug/mL (ref >1.3)

## 2017-02-17 LAB — IGG, IGA, IGM
IGA/IMMUNOGLOBULIN A, SERUM: 181 mg/dL (ref 61–437)
IGG (IMMUNOGLOBIN G), SERUM: 971 mg/dL (ref 700–1600)
IgM (Immunoglobulin M), Srm: 129 mg/dL (ref 20–172)

## 2017-02-17 LAB — TETANUS ANTIBODY, IGG: Tetanus Ab, IgG: 1.47 IU/mL (ref <0.10)

## 2017-02-21 DIAGNOSIS — R079 Chest pain, unspecified: Secondary | ICD-10-CM | POA: Diagnosis not present

## 2017-02-22 ENCOUNTER — Emergency Department (HOSPITAL_COMMUNITY): Payer: Medicare Other

## 2017-02-22 ENCOUNTER — Encounter (HOSPITAL_COMMUNITY): Payer: Self-pay

## 2017-02-22 ENCOUNTER — Telehealth: Payer: Self-pay | Admitting: *Deleted

## 2017-02-22 ENCOUNTER — Emergency Department (HOSPITAL_COMMUNITY)
Admission: EM | Admit: 2017-02-22 | Discharge: 2017-02-22 | Disposition: A | Discharge status: Home / Self Care | Payer: Medicare Other | Attending: Emergency Medicine | Admitting: Emergency Medicine

## 2017-02-22 DIAGNOSIS — Z7982 Long term (current) use of aspirin: Secondary | ICD-10-CM | POA: Insufficient documentation

## 2017-02-22 DIAGNOSIS — Z87891 Personal history of nicotine dependence: Secondary | ICD-10-CM | POA: Insufficient documentation

## 2017-02-22 DIAGNOSIS — I2510 Atherosclerotic heart disease of native coronary artery without angina pectoris: Secondary | ICD-10-CM | POA: Diagnosis not present

## 2017-02-22 DIAGNOSIS — I252 Old myocardial infarction: Secondary | ICD-10-CM | POA: Diagnosis not present

## 2017-02-22 DIAGNOSIS — R079 Chest pain, unspecified: Secondary | ICD-10-CM | POA: Diagnosis not present

## 2017-02-22 DIAGNOSIS — Z79899 Other long term (current) drug therapy: Secondary | ICD-10-CM | POA: Insufficient documentation

## 2017-02-22 DIAGNOSIS — Z96653 Presence of artificial knee joint, bilateral: Secondary | ICD-10-CM | POA: Diagnosis not present

## 2017-02-22 DIAGNOSIS — R1013 Epigastric pain: Secondary | ICD-10-CM | POA: Insufficient documentation

## 2017-02-22 DIAGNOSIS — J45909 Unspecified asthma, uncomplicated: Secondary | ICD-10-CM | POA: Insufficient documentation

## 2017-02-22 DIAGNOSIS — I10 Essential (primary) hypertension: Secondary | ICD-10-CM | POA: Diagnosis not present

## 2017-02-22 LAB — CBC
HCT: 48.6 % (ref 39.0–52.0)
Hemoglobin: 16.5 g/dL (ref 13.0–17.0)
MCH: 32.2 pg (ref 26.0–34.0)
MCHC: 34 g/dL (ref 30.0–36.0)
MCV: 94.7 fL (ref 78.0–100.0)
PLATELETS: 201 10*3/uL (ref 150–400)
RBC: 5.13 MIL/uL (ref 4.22–5.81)
RDW: 13.8 % (ref 11.5–15.5)
WBC: 9.5 10*3/uL (ref 4.0–10.5)

## 2017-02-22 LAB — BASIC METABOLIC PANEL
Anion gap: 11 (ref 5–15)
BUN: 16 mg/dL (ref 6–20)
CALCIUM: 9.4 mg/dL (ref 8.9–10.3)
CO2: 27 mmol/L (ref 22–32)
Chloride: 99 mmol/L — ABNORMAL LOW (ref 101–111)
Creatinine, Ser: 0.87 mg/dL (ref 0.61–1.24)
GFR calc Af Amer: >60 mL/min (ref >60)
GFR calc non Af Amer: >60 mL/min (ref >60)
GLUCOSE: 140 mg/dL — AB (ref 65–99)
Potassium: 3.9 mmol/L (ref 3.5–5.1)
Sodium: 137 mmol/L (ref 135–145)

## 2017-02-22 LAB — HEPATIC FUNCTION PANEL
ALK PHOS: 120 U/L (ref 38–126)
ALT: 143 U/L — ABNORMAL HIGH (ref 17–63)
AST: 190 U/L — AB (ref 15–41)
Albumin: 4 g/dL (ref 3.5–5.0)
BILIRUBIN DIRECT: 0.5 mg/dL (ref 0.1–0.5)
Indirect Bilirubin: 2 mg/dL — ABNORMAL HIGH (ref 0.3–0.9)
Total Bilirubin: 2.5 mg/dL — ABNORMAL HIGH (ref 0.3–1.2)
Total Protein: 6.9 g/dL (ref 6.5–8.1)

## 2017-02-22 LAB — I-STAT TROPONIN, ED
TROPONIN I, POC: 0 ng/mL (ref 0.00–0.08)
Troponin i, poc: 0 ng/mL (ref 0.00–0.08)

## 2017-02-22 LAB — LIPASE, BLOOD: LIPASE: 31 U/L (ref 11–51)

## 2017-02-22 MED ORDER — SUCRALFATE 1 GM/10ML PO SUSP: 1.0000 g | Freq: Three times a day (TID) | ORAL | 0 refills | Status: DC

## 2017-02-22 MED ORDER — GI COCKTAIL ~~LOC~~
30.0000 mL | Freq: Once | ORAL | Status: AC
  Administered 2017-02-22: 30 mL via ORAL
  Filled 2017-02-22: qty 30

## 2017-02-22 MED ORDER — HYDROCODONE-ACETAMINOPHEN 5-325 MG PO TABS: 2.0000 | ORAL_TABLET | ORAL | 0 refills | Status: DC | PRN

## 2017-02-22 MED ORDER — NITROGLYCERIN 0.4 MG SL SUBL
0.4000 mg | SUBLINGUAL_TABLET | SUBLINGUAL | Status: DC | PRN
  Filled 2017-02-22: qty 1

## 2017-02-22 MED ORDER — ONDANSETRON HCL 4 MG/2ML IJ SOLN
4.0000 mg | Freq: Once | INTRAMUSCULAR | Status: AC
  Administered 2017-02-22: 4 mg via INTRAVENOUS
  Filled 2017-02-22: qty 2

## 2017-02-22 MED ORDER — HYDROMORPHONE HCL 2 MG/ML IJ SOLN
0.5000 mg | Freq: Once | INTRAMUSCULAR | Status: AC
  Administered 2017-02-22: 0.5 mg via INTRAVENOUS
  Filled 2017-02-22: qty 1

## 2017-02-22 NOTE — ED Notes (Signed)
Patient transported to Ultrasound 

## 2017-02-22 NOTE — Telephone Encounter (Signed)
Pharmacy called related to Rx: sucralfate (CARAFATE) 1 GM/10ML suspension not being covered by insurance .Marland Kitchen.Marland Kitchen.EDCM clarified with EDP (Long) to change Rx to: tablets.

## 2017-02-22 NOTE — ED Triage Notes (Signed)
Pt endorses epigastric pain moving up into the left side of the chest that began this evening while sitting down. Pt has hx of stent placement. Pt endorses diaphoresis and shob with chest pain. Pt also has a known blockage "that the dr's can't do anything about" VSS.

## 2017-02-22 NOTE — ED Provider Notes (Signed)
[facility]-EMERGENCY DEPT Provider Note   CSN: [id number] Arrival date & time: 02/22/17  0102  By signing my name below, I, Majel Homer, attest that this documentation has been prepared under the direction and in the presence of Gilda Crease, MD . Electronically Signed: Majel Homer, Scribe. 02/22/2017. 1:32 AM.  History   Chief Complaint Chief Complaint  Patient presents with  . Abdominal Pain  . Chest Pain   The history is provided by the patient and the spouse. No language interpreter was used.   HPI Comments: MATHHEW BUYSSE is a 66 y.o. male with PMHx of CAD, HTN, HLD, NSTEMI, and GERD, who presents to the Emergency Department complaining of gradual onset, epigastric abdominal pain with radiation into the left side of his chest that began ~6 hours PTA. Pt reports associated diaphoresis and intermittent, shortness of breath. He notes his symptoms gradually improved ~4 hours PTA but have now returned and worsened while in the ED. He states hx of stent placement ~1 year ago for a "small artery blockage" and reports his current pai is dissimilar to previous exacerbations of chest pain. Per wife, pt also recently started a new medication for his asthma. Pt denies any abnormal liver or pancreatic history, ulcers, cholecystectomy, and consistent EtOH use.   Past Medical History:  Diagnosis Date  . Arthritis    "hips, lower back, knees, shoulders, hands" (05/14/2016)  . Asthma dx'd in 2014  . Bacterial septicemia (HCC) 01/2011   "both knee involved"  . CAD (coronary artery disease)    a. NSTEMI 02/2016 s/p DES to LAD. b. Relook cath due to angina/prior high risk anatomy 05/14/16 -> continued medical therapy given concern that a stent strut could inferere with Cx itself.  Marland Kitchen GERD (gastroesophageal reflux disease)   . Gilbert's disease   . Hyperlipidemia   . Hypertension   . 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 NSTEMI (non-ST elevated myocardial infarction) (HCC) 02/2016   [name]  03/02/2016  . Sinus bradycardia   . Unspecified tinnitus    right    Patient Active Problem List   Diagnosis Date Noted  . Pansinusitis 01/06/2017  . Hyperlipidemia 07/23/2016  . Anginal chest pain at rest [facility]) 06/17/2016  . Coronary artery disease involving native coronary artery of native heart with unstable angina pectoris (HCC) 06/15/2016  . Stented coronary artery 06/15/2016  . Pain in the chest 05/28/2016  . Sinus bradycardia 05/15/2016  . Unstable angina (HCC) 05/14/2016  . CAD (coronary artery disease), native coronary artery 05/14/2016  . Dyslipidemia 09/28/2013  . GERD (gastroesophageal reflux disease) 09/28/2013  . 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 Moderate persistent chronic asthma with acute exacerbation 06/05/2013  . Hypertension 06/05/2013    Past Surgical History:  Procedure Laterality Date  . BACK SURGERY    . CARDIAC CATHETERIZATION N/A 03/01/2016   Procedure: Left Heart Cath and Coronary Angiography;  Surgeon: Tonny Bollman, MD;  oLAD 95%, CFX/RI/RCA/RPDA 25-30%  . CARDIAC CATHETERIZATION N/A 03/01/2016   Procedure: Coronary Stent Intervention;  Surgeon: Tonny Bollman, MD;  Promus Premier 4.0 x  12 mm DES to oLAD  . CARDIAC CATHETERIZATION N/A 05/14/2016   Procedure: Left Heart Cath and Coronary Angiography;  Surgeon: Lennette Bihari, MD;  Location: [facility] INVASIVE CV LAB;  Service: Cardiovascular;  Laterality: N/A;  . CARDIAC CATHETERIZATION N/A 06/17/2016   Procedure: Intravascular Pressure Wire/FFR Study;  Surgeon: Tonny Bollman, MD;  Location: [facility] INVASIVE CV LAB;  Service: Cardiovascular;  Laterality: N/A;  . CARDIOVASCULAR STRESS TEST  05/26/2012   EKG negative for ischemia, no ECG changes, no significant ischemia noted  . I&D KNEE WITH POLY EXCHANGE Bilateral 01/2011   & complete synovectomy of 3 compartments of the right total knee/notes 02/04/2011 (replacement,partial knee replacement because of bacterial infection)  . JOINT REPLACEMENT    . KNEE ARTHROSCOPY Right ~  2002  . LUMBAR DISC SURGERY  ~ 1980; 2000  . NASAL SINUS SURGERY  08/2011  . SHOULDER ARTHROSCOPY W/ ROTATOR CUFF REPAIR Right 01/2015  . TOTAL KNEE ARTHROPLASTY Bilateral 03/26/2004-09/15/2004    right-left  . TRANSTHORACIC ECHOCARDIOGRAM  05/26/2012   EF >55%, mild concentric LVH    Home Medications    Prior to Admission medications   Medication Sig Start Date End Date Taking? Authorizing Provider  albuterol (PROAIR HFA) 108 (90 BASE) MCG/ACT inhaler 2 puffs every 4 hours as needed only  if your can't catch your breath Patient taking differently: Inhale 2 puffs into the lungs every 4 (four) hours as needed for shortness of breath.  08/22/15  Yes Nyoka Cowden, MD  aspirin 81 MG tablet Take 81 mg by mouth at bedtime.    Yes Historical Provider, MD  atorvastatin (LIPITOR) 80 MG tablet Take 1 tablet (80 mg total) by mouth daily at 6 PM. 03/03/16  Yes Bhavinkumar Bhagat, PA  diphenhydrAMINE (BENADRYL) 25 MG tablet Take 25 mg by mouth every 6 (six) hours as needed for allergies.   Yes Historical Provider, MD  fluticasone (FLONASE) 50 MCG/ACT nasal spray Place 2 sprays into both nostrils as needed for allergies.    Yes Historical Provider, MD  Garlic 1000 MG CAPS Take 1,000 mg by mouth daily.    Yes Historical Provider, MD  isosorbide mononitrate (IMDUR) 60 MG 24 hr tablet Take 60 mg by mouth daily.  02/04/17  Yes Historical Provider, MD  losartan (COZAAR) 50 MG tablet Take 50 mg by mouth daily.  02/03/17  Yes Historical Provider, MD  montelukast (SINGULAIR) 10 MG tablet Take 1 tablet (10 mg total) by mouth at bedtime. 02/09/17  Yes Jessica Priest, MD  Multiple Vitamin (MULTIVITAMIN WITH MINERALS) TABS tablet Take 1 tablet by mouth daily.   Yes Historical Provider, MD  nitroGLYCERIN (NITROSTAT) 0.4 MG SL tablet Place 1 tablet (0.4 mg total) under the tongue every 5 (five) minutes x 3 doses as needed for chest pain. 03/03/16  Yes Bhavinkumar Bhagat, PA  omeprazole (PRILOSEC) 40 MG capsule Take 1 capsule (40  mg total) by mouth 2 (two) times daily. 04/20/16  Yes Chrystie Nose, MD  OXcarbazepine ER (OXTELLAR XR) 300 MG TB24 Take 300 mg by mouth at bedtime. 01/27/17  Yes Anson Fret, MD  RANEXA 1000 MG SR tablet TAKE 1 TABLET (1,000 MG TOTAL) BY MOUTH 2 (TWO) TIMES DAILY. 01/11/17  Yes Chrystie Nose, MD  ranitidine (ZANTAC) 300 MG tablet Take one tablet each evening as directed Patient taking differently: Take 300 mg by mouth at bedtime.  02/09/17  Yes Jessica Priest, MD  SYMBICORT 160-4.5 MCG/ACT inhaler Inhale 2 puffs into the lungs 2 (two) times daily.  01/17/17  Yes Historical Provider, MD  ticagrelor (BRILINTA) 90 MG TABS tablet Take 1 tablet (90 mg total) by mouth 2 (two) times daily. 03/03/16  Yes Bhavinkumar Bhagat, PA  HYDROcodone-acetaminophen (NORCO/VICODIN) 5-325 MG tablet Take 2 tablets by mouth every 4 (four) hours as needed for moderate pain. 02/22/17   Gilda Crease, MD  sucralfate (CARAFATE) 1 GM/10ML suspension Take 10 mLs (1 g total)  by mouth 4 (four) times daily -  with meals and at bedtime. 02/22/17   Gilda Creasehristopher J Pollina, MD    Family History Family History  Problem Relation Age of Onset  . Dementia Father   . Alzheimer's disease Father   . Heart disease Mother     Onset-Late 3540s  . Heart disease Brother 360  . Cancer Paternal Aunt   . Cancer Paternal Uncle   . Cancer Paternal Uncle   . Cancer Paternal Uncle     Social History Social History  Substance Use Topics  . Smoking status: Former Smoker    Packs/day: 1.00    Years: 5.00    Types: Cigarettes    Quit date: 12/14/1968  . Smokeless tobacco: Never Used  . Alcohol use 0.6 oz/week    1 Cans of beer per week     Comment: Occasional   Allergies   Oxycontin [oxycodone hcl]; Percocet [oxycodone-acetaminophen]; and Ambien [zolpidem tartrate]  Review of Systems Review of Systems  Constitutional: Positive for diaphoresis.  Respiratory: Positive for shortness of breath.   Cardiovascular: Positive for chest  pain.  Gastrointestinal: Positive for abdominal pain.  All other systems reviewed and are negative.  Physical Exam Updated Vital Signs BP 134/75 (BP Location: Right Arm)   Pulse 73   Temp 97.4 F (36.3 C) (Oral)   Resp 20   Ht 6' 2.5" (1.892 m)   Wt 200 lb (90.7 kg)   SpO2 95%   BMI 25.33 kg/m   Physical Exam  Constitutional: He is oriented to person, place, and time. He appears well-developed and well-nourished. No distress.  HENT:  Head: Normocephalic and atraumatic.  Right Ear: Hearing normal.  Left Ear: Hearing normal.  Nose: Nose normal.  Mouth/Throat: Oropharynx is clear and moist and mucous membranes are normal.  Eyes: Conjunctivae and EOM are normal. Pupils are equal, round, and reactive to light.  Neck: Normal range of motion. Neck supple.  Cardiovascular: Regular rhythm, S1 normal and S2 normal.  Exam reveals no gallop and no friction rub.   No murmur heard. Pulmonary/Chest: Effort normal and breath sounds normal. No respiratory distress. He exhibits no tenderness.  Abdominal: Soft. Normal appearance and bowel sounds are normal. There is no hepatosplenomegaly. There is tenderness. There is no rebound, no guarding, no tenderness at McBurney's point and negative Murphy's sign. No hernia.  Epigastric tenderness  Musculoskeletal: Normal range of motion.  Neurological: He is alert and oriented to person, place, and time. He has normal strength. No cranial nerve deficit or sensory deficit. Coordination normal. GCS eye subscore is 4. GCS verbal subscore is 5. GCS motor subscore is 6.  Skin: Skin is warm, dry and intact. No rash noted. No cyanosis.  Psychiatric: He has a normal mood and affect. His speech is normal and behavior is normal. Thought content normal.  Nursing note and vitals reviewed.  ED Treatments / Results  DIAGNOSTIC STUDIES:  Oxygen Saturation is 95% on RA, adequate by my interpretation.    COORDINATION OF CARE:  1:25 AM Discussed treatment plan with pt  at bedside and pt agreed to plan.  Labs (all labs ordered are listed, but only abnormal results are displayed) Labs Reviewed  BASIC METABOLIC PANEL - Abnormal; Notable for the following:       Result Value   Chloride 99 (*)    Glucose, Bld 140 (*)    All other components within normal limits  HEPATIC FUNCTION PANEL - Abnormal; Notable for the following:  AST 190 (*)    ALT 143 (*)    Total Bilirubin 2.5 (*)    Indirect Bilirubin 2.0 (*)    All other components within normal limits  CBC  LIPASE, BLOOD  I-STAT TROPOININ, ED  I-STAT TROPOININ, ED    EKG  EKG Interpretation  Date/Time:  Monday February 22 2017 01:10:22 EDT Ventricular Rate:  70 PR Interval:  162 QRS Duration: 96 QT Interval:  422 QTC Calculation: 455 R Axis:   44 Text Interpretation:  Normal sinus rhythm Normal ECG Confirmed by POLLINA  MD, CHRISTOPHER [phone]) on 02/22/2017 1:15:21 AM       Radiology Dg Chest 2 View  Result Date: 02/22/2017 CLINICAL DATA:  Chest pain and epigastric pain tonight. Shortness of breath and nausea. EXAM: CHEST  2 VIEW COMPARISON:  02/29/2016 FINDINGS: The heart size and mediastinal contours are within normal limits. Both lungs are clear. The visualized skeletal structures are unremarkable. IMPRESSION: No active cardiopulmonary disease. Electronically Signed   By: Burman Nieves M.D.   On: 02/22/2017 02:07   US Abdomen Limited Ruq  Result Date: 02/22/2017 CLINICAL DATA:  Epigastric pain radiating to the left side of the chest beginning about 8 hours ago. EXAM: US ABDOMEN LIMITED - RIGHT UPPER QUADRANT COMPARISON:  CT abdomen and pelvis 05/15/2014 FINDINGS: Gallbladder: Small amount of sludge in a mildly distended gallbladder. No stones or wall thickening. Murphy's sign is negative. Common bile duct: Diameter: 5.3 mm, normal.  Distal portion is not visualized. Liver: No focal lesion identified. Within normal limits in parenchymal echogenicity. IMPRESSION: Mildly distended gallbladder  with sludge. No inflammatory changes or bile duct dilatation. Electronically Signed   By: Burman Nieves M.D.   On: 02/22/2017 03:25    Procedures Procedures (including critical care time)  Medications Ordered in ED Medications  nitroGLYCERIN (NITROSTAT) SL tablet 0.4 mg (not administered)  gi cocktail (Maalox,Lidocaine,Donnatal) (30 mLs Oral Given 02/22/17 0137)  HYDROmorphone (DILAUDID) injection 0.5 mg (0.5 mg Intravenous Given 02/22/17 0350)  ondansetron (ZOFRAN) injection 4 mg (4 mg Intravenous Given 02/22/17 0350)    Initial Impression / Assessment and Plan / ED Course  I have reviewed the triage vital signs and the nursing notes.  Pertinent labs & imaging results that were available during my care of the patient were reviewed by me and considered in my medical decision making (see chart for details).     Patient presents to the emergency part for evaluation of abdominal pain. Patient experiencing epigastric pain after eating. He does have a history of heart disease including a non-STEMI in the past treated with stenting. He does have some residual disease that was not amenable to PCI. Patient reports that the symptoms she is currently experiencing are completely different than when he had his MI.  EKG is entirely normal. Presentation troponin was normal. Repeat troponin 4 hours later was also normal. As symptoms are atypical for cardiac etiology, his pain began 8 hours ago and he has had 2 negative troponins, no further cardiac workup is necessary.  Examination did reveal tenderness in the epigastric region. There was no Murphy's sign, however, minimal pain in the right upper quadrant. Ultrasound did show mild distention of the gallbladder, but no evidence of acute gallbladder disease. He did have slightly elevated LFTs. Patient does have a history of Gilbert's. It is possible that this is causing his pain, although he has not had similar pains in the past. As the pain began after  eating, gastritis and peptic ulcer disease are  considered. No free air under diaphragm on x-ray. Patient already on Prilosec BID and Ranitidine BID, will add Carafate. Patient feeling better after analgesia here in the ER. Avoid NSAIDs. Will refer to gastroenterology.  I personally performed the services described in this documentation, which was scribed in my presence. The recorded information has been reviewed and is accurate.   Final Clinical Impressions(s) / ED Diagnoses   Final diagnoses:  Epigastric pain    New Prescriptions New Prescriptions   HYDROCODONE-ACETAMINOPHEN (NORCO/VICODIN) 5-325 MG TABLET    Take 2 tablets by mouth every 4 (four) hours as needed for moderate pain.   SUCRALFATE (CARAFATE) 1 GM/10ML SUSPENSION    Take 10 mLs (1 g total) by mouth 4 (four) times daily -  with meals and at bedtime.     Gilda Crease, MD 02/22/17 0530

## 2017-02-24 ENCOUNTER — Ambulatory Visit (HOSPITAL_COMMUNITY)
Admission: RE | Admit: 2017-02-24 | Discharge: 2017-02-24 | Disposition: A | Discharge status: Home / Self Care | Payer: Medicare Other | Source: Ambulatory Visit | Attending: Allergy and Immunology | Admitting: Allergy and Immunology

## 2017-02-24 DIAGNOSIS — J320 Chronic maxillary sinusitis: Secondary | ICD-10-CM | POA: Diagnosis not present

## 2017-02-24 DIAGNOSIS — J322 Chronic ethmoidal sinusitis: Secondary | ICD-10-CM | POA: Diagnosis not present

## 2017-02-24 DIAGNOSIS — J301 Allergic rhinitis due to pollen: Secondary | ICD-10-CM | POA: Diagnosis not present

## 2017-02-24 DIAGNOSIS — J4540 Moderate persistent asthma, uncomplicated: Secondary | ICD-10-CM | POA: Diagnosis not present

## 2017-02-24 DIAGNOSIS — J040 Acute laryngitis: Secondary | ICD-10-CM | POA: Diagnosis not present

## 2017-02-24 DIAGNOSIS — J984 Other disorders of lung: Secondary | ICD-10-CM | POA: Insufficient documentation

## 2017-02-24 DIAGNOSIS — J3089 Other allergic rhinitis: Secondary | ICD-10-CM | POA: Diagnosis not present

## 2017-02-24 LAB — PULMONARY FUNCTION TEST
DL/VA % PRED: 118 %
DL/VA: 5.79 ml/min/mmHg/L
DLCO COR: 25.3 ml/min/mmHg
DLCO cor % pred: 64 %
DLCO unc % pred: 67 %
DLCO unc: 26.56 ml/min/mmHg
FEF 25-75 Post: 1.69 L/sec
FEF 25-75 Pre: 2.11 L/sec
FEF2575-%CHANGE-POST: -20 %
FEF2575-%PRED-PRE: 66 %
FEF2575-%Pred-Post: 53 %
FEV1-%CHANGE-POST: -3 %
FEV1-%PRED-POST: 60 %
FEV1-%PRED-PRE: 63 %
FEV1-Post: 2.47 L
FEV1-Pre: 2.56 L
FEV1FVC-%Change-Post: 4 %
FEV1FVC-%PRED-PRE: 101 %
FEV6-%Change-Post: -8 %
FEV6-%PRED-POST: 60 %
FEV6-%Pred-Pre: 65 %
FEV6-POST: 3.12 L
FEV6-PRE: 3.4 L
FEV6FVC-%CHANGE-POST: 0 %
FEV6FVC-%PRED-POST: 105 %
FEV6FVC-%PRED-PRE: 105 %
FVC-%Change-Post: -7 %
FVC-%PRED-POST: 57 %
FVC-%Pred-Pre: 62 %
FVC-POST: 3.15 L
FVC-Pre: 3.4 L
PRE FEV6/FVC RATIO: 100 %
Post FEV1/FVC ratio: 79 %
Post FEV6/FVC ratio: 100 %
Pre FEV1/FVC ratio: 75 %
RV % pred: 84 %
RV: 2.24 L
TLC % pred: 69 %
TLC: 5.57 L

## 2017-02-24 MED ORDER — ALBUTEROL SULFATE (2.5 MG/3ML) 0.083% IN NEBU
2.5000 mg | INHALATION_SOLUTION | Freq: Once | RESPIRATORY_TRACT | Status: AC
  Administered 2017-02-24: 2.5 mg via RESPIRATORY_TRACT

## 2017-03-02 ENCOUNTER — Ambulatory Visit (INDEPENDENT_AMBULATORY_CARE_PROVIDER_SITE_OTHER): Payer: Medicare Other | Admitting: Allergy and Immunology

## 2017-03-02 VITALS — BP 120/70 | HR 74 | Resp 16 | Ht 74.0 in | Wt 205.0 lb

## 2017-03-02 DIAGNOSIS — J4540 Moderate persistent asthma, uncomplicated: Secondary | ICD-10-CM | POA: Diagnosis not present

## 2017-03-02 DIAGNOSIS — J3089 Other allergic rhinitis: Secondary | ICD-10-CM | POA: Diagnosis not present

## 2017-03-02 DIAGNOSIS — I200 Unstable angina: Secondary | ICD-10-CM | POA: Diagnosis not present

## 2017-03-02 DIAGNOSIS — K219 Gastro-esophageal reflux disease without esophagitis: Secondary | ICD-10-CM

## 2017-03-02 DIAGNOSIS — J984 Other disorders of lung: Secondary | ICD-10-CM | POA: Diagnosis not present

## 2017-03-02 DIAGNOSIS — D721 Eosinophilia, unspecified: Secondary | ICD-10-CM

## 2017-03-02 DIAGNOSIS — J324 Chronic pansinusitis: Secondary | ICD-10-CM | POA: Diagnosis not present

## 2017-03-02 NOTE — Patient Instructions (Signed)
  1. Continue Allergen avoidance measures  2. Continue to Treat and prevent inflammation:   A. Change Symbicort to Dulera 100 - 2 inhalations 2 x day with spacer  B. Flonase - one spray each nostril twice per day  C. montelukast 10 mg - one tablet one time per day  3. Continue toTreat reflux:   A. Remain away from caffeine  B. omeprazole 40 mg twice a day  C. ranitidine 300 mg in evening  5. If needed:   A. ProAir HFA 2 puffs every 4-6 hours  B. nasal saline  C. OTC antihistamine - Claritin 10 mg daily  6. Return in June 2018 or earlier if problem

## 2017-03-02 NOTE — Progress Notes (Signed)
Follow-up Note  Referring Provider: No ref. provider found Primary Provider: No PCP Per Patient Date of Office Visit: 03/02/2017  Subjective:   Louis Perez (DOB: 06-03-1951) is a 66 y.o. male who [facility] in re-evaluation of the following:  HPI: Ramon Dredge returns to this clinic in reevaluation of his respiratory tract issues. I last saw him in this clinic on 09 February 2017 at which point in time he appeared to have possible asthma, possible restrictive lung disease, LPR, and chronic sinusitis. During that evaluation we established a plan to address each issue.  He is much better regarding his air hunger and out of breath sensation and dyspnea. In fact, he feels as though he has "good air" at this point in time and has no wheezing and coughing and does not use a bronchodilator. His insurance will no longer pay for Symbicort.  He believes that his nose is doing much better. It is more open and not stuffy. He can smell and has no ugly nasal discharge. He occasionally does get a drippy nose with clear rhinorrhea during chilly days. He just finished his Augmentin  His reflux is much better. He still has some throat clearing on occasion but this is dramatically improved. He has discontinued all caffeine consumption. His chest pain has resolved as well. He just finished his nystatin oral solution for his thrush.  Allergies as of 03/02/2017      Reactions   Oxycontin [oxycodone Hcl] Anaphylaxis   Percocet [oxycodone-acetaminophen] Anaphylaxis   Tolerates Vicodin   Ambien [zolpidem Tartrate] Other (See Comments)   Mood changes       Medication List      albuterol 108 (90 Base) MCG/ACT inhaler Commonly known as:  PROAIR HFA 2 puffs every 4 hours as needed only  if your can't catch your breath   aspirin 81 MG tablet Take 81 mg by mouth at bedtime.   atorvastatin 80 MG tablet Commonly known as:  LIPITOR Take 1 tablet (80 mg total)  by mouth daily at 6 PM.   diphenhydrAMINE 25 MG tablet Commonly known as:  BENADRYL Take 25 mg by mouth every 6 (six) hours as needed for allergies.   fluticasone 50 MCG/ACT nasal spray Commonly known as:  FLONASE Place 2 sprays into both nostrils as needed for allergies.   Garlic 1000 MG Caps Take 1,000 mg by mouth daily.   HYDROcodone-acetaminophen 5-325 MG tablet Commonly known as:  NORCO/VICODIN Take 2 tablets by mouth every 4 (four) hours as needed for moderate pain.   isosorbide mononitrate 60 MG 24 hr tablet Commonly known as:  IMDUR Take 60 mg by mouth daily.   losartan 50 MG tablet Commonly known as:  COZAAR Take 50 mg by mouth daily.   montelukast 10 MG tablet Commonly known as:  SINGULAIR Take 1 tablet (10 mg total) by mouth at bedtime.   multivitamin with minerals Tabs tablet Take 1 tablet by mouth daily.   nitroGLYCERIN 0.4 MG SL tablet Commonly known as:  NITROSTAT Place 1 tablet (0.4 mg total) under the tongue every 5 (five) minutes x 3 doses as needed for chest pain.   nystatin 100000 UNIT/ML suspension Commonly known as:  MYCOSTATIN   omeprazole 40 MG capsule Commonly known as:  PRILOSEC Take 1 capsule (40 mg total) by mouth 2 (two) times daily.   OXcarbazepine ER 300 MG Tb24 Commonly known as:  OXTELLAR XR Take 300 mg by mouth at bedtime.  RANEXA 1000 MG SR tablet Generic drug:  ranolazine TAKE 1 TABLET (1,000 MG TOTAL) BY MOUTH 2 (TWO) TIMES DAILY.   ranitidine 300 MG tablet Commonly known as:  ZANTAC Take one tablet each evening as directed   sucralfate 1 GM/10ML suspension Commonly known as:  CARAFATE Take 10 mLs (1 g total) by mouth 4 (four) times daily -  with meals and at bedtime.   SYMBICORT 160-4.5 MCG/ACT inhaler Generic drug:  budesonide-formoterol Inhale 2 puffs into the lungs 2 (two) times daily.   ticagrelor 90 MG Tabs tablet Commonly known as:  BRILINTA Take 1 tablet (90 mg total) by mouth 2 (two) times daily.        Past Medical History:  Diagnosis Date  . Arthritis    "hips, lower back, knees, shoulders, hands" (05/14/2016)  . Asthma dx'd in 2014  . Bacterial septicemia (HCC) 01/2011   "both knee involved"  . CAD (coronary artery disease)    a. NSTEMI 02/2016 s/p DES to LAD. b. Relook cath due to angina/prior high risk anatomy 05/14/16 -> continued medical therapy given concern that a stent strut could inferere with Cx itself.  Marland Kitchen. GERD (gastroesophageal reflux disease)   . Gilbert's disease   . Hyperlipidemia   . Hypertension   . 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 NSTEMI (non-ST elevated myocardial infarction) (HCC) 02/2016   [name]/notes 03/02/2016  . Sinus bradycardia   . Unspecified tinnitus    right    Past Surgical History:  Procedure Laterality Date  . BACK SURGERY    . CARDIAC CATHETERIZATION N/A 03/01/2016   Procedure: Left Heart Cath and Coronary Angiography;  Surgeon: Tonny BollmanMichael Cooper, MD;  oLAD 95%, CFX/RI/RCA/RPDA 25-30%  . CARDIAC CATHETERIZATION N/A 03/01/2016   Procedure: Coronary Stent Intervention;  Surgeon: Tonny BollmanMichael Cooper, MD;  Promus Premier 4.0 x  12 mm DES to oLAD  . CARDIAC CATHETERIZATION N/A 05/14/2016   Procedure: Left Heart Cath and Coronary Angiography;  Surgeon: Lennette Biharihomas A Kelly, MD;  [facility];  Service: Cardiovascular;  Laterality: N/A;  . CARDIAC CATHETERIZATION N/A 06/17/2016   Procedure: Intravascular Pressure Wire/FFR Study;  Surgeon: Tonny BollmanMichael Cooper, MD;  [facility];  Service: Cardiovascular;  Laterality: N/A;  . CARDIOVASCULAR STRESS TEST  05/26/2012   EKG negative for ischemia, no ECG changes, no significant ischemia noted  . I&D KNEE WITH POLY EXCHANGE Bilateral 01/2011   & complete synovectomy of 3 compartments of the right total knee/notes 02/04/2011 (replacement,partial knee replacement because of bacterial infection)  . JOINT REPLACEMENT    . KNEE ARTHROSCOPY Right ~ 2002  . LUMBAR DISC SURGERY  ~ 1980; 2000  . NASAL SINUS  SURGERY  08/2011  . SHOULDER ARTHROSCOPY W/ ROTATOR CUFF REPAIR Right 01/2015  . TOTAL KNEE ARTHROPLASTY Bilateral 03/26/2004-09/15/2004    right-left  . TRANSTHORACIC ECHOCARDIOGRAM  05/26/2012   EF >55%, mild concentric LVH    Review of systems negative except as noted in HPI / PMHx or noted below:  Review of Systems  Constitutional: Negative.   HENT: Negative.   Eyes: Negative.   Respiratory: Negative.   Cardiovascular: Negative.   Gastrointestinal: Negative.   Genitourinary: Negative.   Musculoskeletal: Negative.   Skin: Negative.   Neurological: Negative.   Endo/Heme/Allergies: Negative.   Psychiatric/Behavioral: Negative.      Objective:   Vitals:   03/02/17 1050  BP: 120/70  Pulse: 74  Resp: 16   Height: 6\' 2"  (188 cm)  Weight: 205 lb (93  kg)   Physical Exam  Constitutional: He is well-developed, well-nourished, and in no distress.  HENT:  Head: Normocephalic.  Right Ear: Tympanic membrane, external ear and ear canal normal.  Left Ear: Tympanic membrane, external ear and ear canal normal.  Nose: Nose normal. No mucosal edema or rhinorrhea.  Mouth/Throat: Uvula is midline, oropharynx is clear and moist and mucous membranes are normal. No oropharyngeal exudate.  Eyes: Conjunctivae are normal.  Neck: Trachea normal. No tracheal tenderness present. No tracheal deviation present. No thyromegaly present.  Cardiovascular: Normal rate, regular rhythm, S1 normal, S2 normal and normal heart sounds.   No murmur heard. Pulmonary/Chest: Breath sounds normal. No stridor. No respiratory distress. He has no wheezes. He has no rales.  Musculoskeletal: He exhibits no edema.  Lymphadenopathy:       Head (right side): No tonsillar adenopathy present.       Head (left side): No tonsillar adenopathy present.    He has no cervical adenopathy.  Neurological: He is alert. Gait normal.  Skin: No rash noted. He is not diaphoretic. No erythema. Nails show no clubbing.  Psychiatric:  Mood and affect normal.    Diagnostics: Results of pulmonary function tests obtained 02/10/2017 identified a RV at 84% and a TLC at 69%. His DLCO was 64% but corrected for volume was 118%.   Spirometry was performed and demonstrated an FEV1 of 2.58 at 63 % of predicted.  Assessment and Plan:   1. Asthma, moderate persistent, well-controlled   2. Restrictive lung disease   3. Chronic pansinusitis   4. Other allergic rhinitis   5. LPRD (laryngopharyngeal reflux disease)   6. Eosinophilia     1. Continue Allergen avoidance measures  2. Continue to Treat and prevent inflammation:   A. Change Symbicort to Dulera 100 - 2 inhalations 2 x day with spacer  B. Flonase - one spray each nostril twice per day  C. montelukast 10 mg - one tablet one time per day  3. Continue toTreat reflux:   A. Remain away from caffeine  B. omeprazole 40 mg twice a day  C. ranitidine 300 mg in evening  5. If needed:   A. ProAir HFA 2 puffs every 4-6 hours  B. nasal saline  C. OTC antihistamine - Claritin 10 mg daily  6. Return in June 2018 or earlier if problem  Ed is doing much better at this point in time and other than changing his combination inhaler to [facility] because of an insurance issue we are going to continue to have him use anti-inflammatory adhesions for his respiratory tract and aggressive therapy directed against reflux. He did appear to have a component of chronic sinusitis that apparently was adequately treated with 21 days of Augmentin and his thrush has resolved with his nystatin oral solution. The etiology of his restrictive lung diseases is not known but with a relatively normal CT scan I don't think he'll require any further evaluation for this issue at this point. Assuming he does well I will see him back in this clinic in June 2018 at which point in time I will make an attempt to consolidate his medical therapy aiming for the least amount of medications required to control his disease  state.  Laurette Schimke, MD Allergy / Immunology [facility]

## 2017-03-03 ENCOUNTER — Encounter: Payer: Self-pay | Admitting: Allergy and Immunology

## 2017-03-07 ENCOUNTER — Other Ambulatory Visit: Payer: Self-pay | Admitting: Physician Assistant

## 2017-03-08 ENCOUNTER — Other Ambulatory Visit: Payer: Self-pay | Admitting: Physician Assistant

## 2017-03-09 ENCOUNTER — Other Ambulatory Visit: Payer: Self-pay | Admitting: Allergy and Immunology

## 2017-03-09 NOTE — Telephone Encounter (Signed)
REFILL 

## 2017-03-12 ENCOUNTER — Other Ambulatory Visit: Payer: Self-pay | Admitting: Internal Medicine

## 2017-03-12 DIAGNOSIS — R079 Chest pain, unspecified: Secondary | ICD-10-CM

## 2017-03-17 DIAGNOSIS — J209 Acute bronchitis, unspecified: Secondary | ICD-10-CM | POA: Diagnosis not present

## 2017-03-22 ENCOUNTER — Encounter: Payer: Self-pay | Admitting: Internal Medicine

## 2017-03-22 ENCOUNTER — Ambulatory Visit (INDEPENDENT_AMBULATORY_CARE_PROVIDER_SITE_OTHER): Payer: Medicare Other | Admitting: Internal Medicine

## 2017-03-22 ENCOUNTER — Other Ambulatory Visit: Payer: Self-pay | Admitting: Physician Assistant

## 2017-03-22 VITALS — BP 120/76 | HR 66 | Ht 74.5 in | Wt 199.6 lb

## 2017-03-22 DIAGNOSIS — E785 Hyperlipidemia, unspecified: Secondary | ICD-10-CM

## 2017-03-22 DIAGNOSIS — I10 Essential (primary) hypertension: Secondary | ICD-10-CM

## 2017-03-22 DIAGNOSIS — I200 Unstable angina: Secondary | ICD-10-CM | POA: Diagnosis not present

## 2017-03-22 DIAGNOSIS — I2510 Atherosclerotic heart disease of native coronary artery without angina pectoris: Secondary | ICD-10-CM

## 2017-03-22 NOTE — Patient Instructions (Addendum)
Your physician wants you to follow-up in: 6 months with Dr. Rennis Golden. You will receive a reminder letter in the mail two months in advance. If you don't receive a letter, please call our office to schedule the follow-up appointment.   Your physician has recommended you make the following change in your medication:  -- STOP Brilinta  -- In 2 weeks, DECREASE Ranexa from  twice daily to  once daily (for 2 weeks)  If no worsening chest pain, then discontinue Ranexa  Nicoletta Ba, MD [phone] [place] @ [place], [place] [phone] [place] @ [place] [address]

## 2017-03-22 NOTE — Progress Notes (Signed)
Cardiology Office Note   Date:  03/22/2017   ID:  Louis Perez, DOB [birth year], MRN [id number]  PCP:  Roger Kill, PA-C  Cardiologist:   Chrystie Nose, MD   Chief Complaint  Patient presents with  . 3 month visit    short of breath with wheezing d/t cold & bronchitis w/associated lightheadedness/dizzines      History of Present Illness: Louis Perez is a 66 y.o. male who presents for evaluation of chest and back pain. Patient has a history of coronary disease with recent NSTEMI in March with DES stenting of the LAD.  He had non obstructive disease elsewhere.  EF was normal.    He was added to my schedule today for evaluation of chest and arm discomfort. This began on Sunday. It is similar to his previous unstable angina but not as severe. Still he describes the peak of his discomfort at 7 out of 10 in intensity. He gets across his chest. His left arm hurt. At times he has noticed some diaphoresis. It might last for only a minute. He has taken nitroglycerin and over the course of the last few days and it improves the symptoms. He describes it as somewhat heavy. He's not had any palpitations, presyncope or syncope. He's not describing any new shortness of breath, PND or orthopnea. He has brought the discomfort on a few times when he's been doing activities although this is been intermittent. He was actually able to do a little golfing yesterday without symptoms. However, prior to this he had discomfort with golfing building his children sandbox. This morning the pain was at rest. He still describes a little mid chest tightness.  Of note he has not had the symptoms since his stent.  I did review his catheterization. I looked at the images and he had a very high grade proximal LAD in March with nonobstructive disease in the right coronary.  05/28/2016  Mr. Louis Perez is a 66 year old patient who I last saw in 2014. He did not Louis Perez me in follow-up since that time and is  seen a number of other providers in our practice, ultimately leading to presentation with an STEMI in March with a placement of a drug-eluting stent in the proximal LAD. Subsequent to that he had chest pain as described above and was referred back to the hospital for coronary evaluation. Catheterization indicated an 80% ostial ramus lesion at the area of partially jailed vessel due to the proximal LAD stent. It was felt that intervention could compromise the LAD therefore medical therapy was recommended. His isosorbide was doubled to 60 mg daily. Since then he reports he has had some improvement in chest discomfort but has significant headache.  06/15/2016  Mr. Louis Perez was seen back today in the office for chest pain. When I last saw him we had increased his isosorbide up to 60 mg daily and he's been on Ranexa thousand milligrams twice a day. He reports he continues to have episodes of chest discomfort which can occur at rest or with exertion. I've advised that he stop cardiac rehabilitation until we can determine whether or not his pain is related to coronary obstruction.  07/23/2016  Mr. Louis Perez returns for follow-up today. He underwent repeat cardiac catheterization by Dr. Excell Seltzer. This showed the following:  Conclusion    Prox RCA to Mid RCA lesion, 25% stenosed.  Ost LM lesion, 25% stenosed.  Ost Ramus lesion, 75% stenosed.  Mid LAD to [facility] LAD  lesion, 50% stenosed.  Ost 2nd Diag to 2nd Diag lesion, 30% stenosed.  1. Continued patency of the proximal LAD stent 2. Mild diffuse nonobstructive RCA stenosis with diffuse coronary ectasia 3. Patent left circumflex 4. Moderate-severe stenosis of the ramus intermedius with negative pressure wire analysis (FFR = 0.92 baseline and 0.85 at peak hyperemia)  Recommend: resume cardiac rehab, medical therapy   No intervention was performed. Since discharge she notes that he's had no further chest pain. He feels like the change in his isosorbide is  been helpful. He is about to finish card agreeable dictation and will continue to exercise at the [facility]. He's lost about 5-7 pounds and is well within the normal weight range at this time.  01/04/2017  Mr. Louis Perez returns today for follow-up. He reports that initially had some improvement with increasing his medical therapy for angina but still continues to have sharp, intermittent chest pains on a daily basis. This is typically in the right sternal area. He would not clearly say that his symptoms are progressively getting worse. Blood pressure is well-controlled today. His LDL cholesterol is 93 on 80 mg Lipitor. I advised him that his goal cholesterol should be much lower and that we may have difficulty reaching that since he is on a high potency statin. Options include adding ezetimibe or perhaps enrolling him in the Orion-10 trial. He did seem to be interested in the study medication. His last lipid profile was earlier this year and he will be due for repeat blood work. He is also had problems with asthma and has seen Dr. Sherene Sires for evaluation of this and to include cyclical shortness of breath.  03/22/2017  Mr. Louis Perez returns today for follow-up. He is doing fairly well denies any recurrent chest pain. Unfortunately has an upper respiratory infection and is had a flare of his asthma recently. Cholesterol is much better control with LDL now down to 64 on recent lab work with an LDL particle number less than 1000. ApoB and LPA level both at goal as well. It's now been over one year since his drug-eluting stent. He underwent a relook catheterization which showed a patent stent a couple months after his initial procedure for ongoing chest pain. He is also on Ranexa and isosorbide. He is interested in coming off some of his medicines.  Past Medical History:  Diagnosis Date  . Arthritis    "hips, lower back, knees, shoulders, hands" (05/14/2016)  . Asthma dx'd in 2014  . Bacterial septicemia (HCC) 01/2011    "both knee involved"  . CAD (coronary artery disease)    a. NSTEMI 02/2016 s/p DES to LAD. b. Relook cath due to angina/prior high risk anatomy 05/14/16 -> continued medical therapy given concern that a stent strut could inferere with Cx itself.  Marland Kitchen GERD (gastroesophageal reflux disease)   . Gilbert's disease   . Hyperlipidemia   . Hypertension   . 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 NSTEMI (non-ST elevated myocardial infarction) (HCC) 02/2016   [name] 03/02/2016  . Sinus bradycardia   . Unspecified tinnitus    right    Past Surgical History:  Procedure Laterality Date  . BACK SURGERY    . CARDIAC CATHETERIZATION N/A 03/01/2016   Procedure: Left Heart Cath and Coronary Angiography;  Surgeon: Tonny Bollman, MD;  oLAD 95%, CFX/RI/RCA/RPDA 25-30%  . CARDIAC CATHETERIZATION N/A 03/01/2016   Procedure: Coronary Stent Intervention;  Surgeon: Tonny Bollman, MD;  Promus Premier 4.0 x  12 mm DES to  oLAD  . CARDIAC CATHETERIZATION N/A 05/14/2016   Procedure: Left Heart Cath and Coronary Angiography;  Surgeon: Lennette Bihari, MD;  Location: [facility] INVASIVE CV LAB;  Service: Cardiovascular;  Laterality: N/A;  . CARDIAC CATHETERIZATION N/A 06/17/2016   Procedure: Intravascular Pressure Wire/FFR Study;  Surgeon: Tonny Bollman, MD;  Location: [facility] INVASIVE CV LAB;  Service: Cardiovascular;  Laterality: N/A;  . CARDIOVASCULAR STRESS TEST  05/26/2012   EKG negative for ischemia, no ECG changes, no significant ischemia noted  . I&D KNEE WITH POLY EXCHANGE Bilateral 01/2011   & complete synovectomy of 3 compartments of the right total knee/notes 02/04/2011 (replacement,partial knee replacement because of bacterial infection)  . JOINT REPLACEMENT    . KNEE ARTHROSCOPY Right ~ 2002  . LUMBAR DISC SURGERY  ~ 1980; 2000  . NASAL SINUS SURGERY  08/2011  . SHOULDER ARTHROSCOPY W/ ROTATOR CUFF REPAIR Right 01/2015  . TOTAL KNEE ARTHROPLASTY Bilateral 03/26/2004-09/15/2004    right-left  . TRANSTHORACIC ECHOCARDIOGRAM   05/26/2012   EF >55%, mild concentric LVH     Current Outpatient Prescriptions  Medication Sig Dispense Refill  . aspirin 81 MG tablet Take 81 mg by mouth at bedtime.     Marland Kitchen atorvastatin (LIPITOR) 80 MG tablet TAKE 1 TABLET (80 MG TOTAL) BY MOUTH DAILY AT 6 PM. 30 tablet 0  . Dextromethorphan Polistirex (DELSYM PO) Take by mouth 2 (two) times daily as needed.    . diphenhydrAMINE (BENADRYL) 25 MG tablet Take 25 mg by mouth every 6 (six) hours as needed for allergies.    . fluticasone (FLONASE) 50 MCG/ACT nasal spray Use one spray in each nostril twice daily 16 g 3  . Garlic 1000 MG CAPS Take 1,000 mg by mouth daily.     . isosorbide mononitrate (IMDUR) 60 MG 24 hr tablet Take 60 mg by mouth 2 (two) times daily.   10  . losartan (COZAAR) 50 MG tablet Take 50 mg by mouth daily.   10  . mometasone-formoterol (DULERA) 200-5 MCG/ACT AERO Inhale 1-2 puffs into the lungs 2 (two) times daily.    . Multiple Vitamin (MULTIVITAMIN WITH MINERALS) TABS tablet Take 1 tablet by mouth daily.    . nitroGLYCERIN (NITROSTAT) 0.4 MG SL tablet Place 1 tablet (0.4 mg total) under the tongue every 5 (five) minutes x 3 doses as needed for chest pain. 25 tablet 12  . omeprazole (PRILOSEC) 40 MG capsule Take 1 capsule (40 mg total) by mouth 2 (two) times daily. 60 capsule 2  . OXcarbazepine ER (OXTELLAR XR) 300 MG TB24 Take 300 mg by mouth at bedtime. 30 tablet 12  . Pseudoephedrine-Guaifenesin (MUCINEX D MAX STRENGTH PO) Take 1,200 mg by mouth 2 (two) times daily.    Marland Kitchen RANEXA 1000 MG SR tablet TAKE 1 TABLET (1,000 MG TOTAL) BY MOUTH 2 (TWO) TIMES DAILY. 60 tablet 1  . ranitidine (ZANTAC) 300 MG tablet Take one tablet each evening as directed (Patient taking differently: Take 300 mg by mouth at bedtime. ) 30 tablet 5   No current facility-administered medications for this visit.     Allergies:   Oxycontin [oxycodone hcl]; Percocet [oxycodone-acetaminophen]; and Ambien [zolpidem tartrate]    Social History:  The  patient  reports that he quit smoking about 48 years ago. His smoking use included Cigarettes. He has a 5.00 pack-year smoking history. He has never used smokeless tobacco. He reports that he drinks about 0.6 oz of alcohol per week . He reports that he does not use drugs.  Family History:  The patient's family history includes Alzheimer's disease in his father; Cancer in his paternal aunt, paternal uncle, paternal uncle, and paternal uncle; Dementia in his father; Heart disease in his mother; Heart disease (age of onset: 57) in his brother.    ROS:  Please Louis Perez the history of present illness.   Otherwise, review of systems are positive for none.   All other systems are reviewed and negative.    PHYSICAL EXAM: VS:  BP 120/76   Pulse 66   Ht 6' 2.5" (1.892 m)   Wt 199 lb 9.6 oz (90.5 kg)   BMI 25.28 kg/m  , BMI Body mass index is 25.28 kg/m. General appearance: alert and no distress Lungs: clear to auscultation bilaterally Heart: regular rate and rhythm, S1, S2 normal, no murmur, click, rub or gallop Extremities: extremities normal, atraumatic, no cyanosis or edema Neurologic: Grossly normal  EKG:   Normal sinus rhythm at 66  Recent Labs: 02/22/2017: ALT 143; BUN 16; Creatinine, Ser 0.87; Hemoglobin 16.5; Platelets 201; Potassium 3.9; Sodium 137    Lipid Panel    Component Value Date/Time   CHOL 136 01/08/2017 0833   TRIG 109 01/08/2017 0833   HDL 53 01/08/2017 0833   CHOLHDL 2.6 01/08/2017 0833   CHOLHDL 3.0 03/01/2016 0448   VLDL 17 03/01/2016 0448   LDLCALC 64 01/08/2017 0833      Wt Readings from Last 3 Encounters:  03/22/17 199 lb 9.6 oz (90.5 kg)  03/02/17 205 lb (93 kg)  02/22/17 200 lb (90.7 kg)    ASSESSMENT AND PLAN:  Patient Active Problem List   Diagnosis Date Noted  . Pansinusitis 01/06/2017  . Hyperlipidemia 07/23/2016  . Anginal chest pain at rest [facility]) 06/17/2016  . Coronary artery disease involving native coronary artery of native heart with  unstable angina pectoris (HCC) 06/15/2016  . Stented coronary artery 06/15/2016  . Pain in the chest 05/28/2016  . Sinus bradycardia 05/15/2016  . Unstable angina (HCC) 05/14/2016  . CAD (coronary artery disease), native coronary artery 05/14/2016  . Dyslipidemia 09/28/2013  . GERD (gastroesophageal reflux disease) 09/28/2013  . 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 Moderate persistent chronic asthma with acute exacerbation 06/05/2013  . Hypertension 06/05/2013   PLAN: Mr. Senn Has achieved goal cholesterol is current dose of 8 atorvastatin. It does not appear that will need additional medication. It's been over year since his drug-eluting stent and he is not describing anginal symptoms. I recommended discontinuing Brilinta. He also wishes to come off of some of his antianginal medicines. I advised that he wait 2 weeks after discontinuing below Brilinta and then decrease his dose of Ranexa to 1000 mg daily. She continue this for 2 additional weeks and then discontinue the medicine if he has no new symptoms. We'll continue on isosorbide and consider addressing that medication when I Louis Perez him back in 6 months. Otherwise no changes to his medicines today. Blood pressure is at goal.  Follow-up with me in 6 months.  Chrystie Nose, MD, [facility] Attending Cardiologist [facility]  Chrystie Nose, MD  03/22/2017 11:58 AM

## 2017-03-23 ENCOUNTER — Ambulatory Visit (INDEPENDENT_AMBULATORY_CARE_PROVIDER_SITE_OTHER): Payer: Medicare Other | Admitting: Neurology

## 2017-03-23 ENCOUNTER — Other Ambulatory Visit: Payer: Self-pay | Admitting: Internal Medicine

## 2017-03-23 ENCOUNTER — Encounter: Payer: Self-pay | Admitting: Neurology

## 2017-03-23 VITALS — BP 114/72 | HR 67 | Ht 74.5 in | Wt 202.2 lb

## 2017-03-23 DIAGNOSIS — I200 Unstable angina: Secondary | ICD-10-CM | POA: Diagnosis not present

## 2017-03-23 DIAGNOSIS — H838X1 Other specified diseases of right inner ear: Secondary | ICD-10-CM | POA: Diagnosis not present

## 2017-03-23 DIAGNOSIS — R079 Chest pain, unspecified: Secondary | ICD-10-CM

## 2017-03-23 NOTE — Progress Notes (Signed)
[facility]    Provider:  Dr Lucia Gaskins Referring Provider: Dr. Haroldine Laws Primary Care Physician:  Roger Kill, PA-C  CC:  Hearing and vision changes  Interval history 03/23/2017: 66 year old with episodes of hearing noises associated with vision changes and imbalance of unclear etiology triggered by loud noises. Its possible there is a vestibulo-occular etiology, as Imbalance and vision changes has been documented to be triggered by loud noises in certain conditions, causing changes in ear pressure and endolymph movement with resultant triggering of the vestibular system with eye oscillopsia. MRI was unremarkable. 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  HPI:  Louis Perez is a 66 y.o. male here as a referral from Dr. Haroldine Laws for hearing and vision changes. Past medical history of high cholesterol, heart disease, hypertension, total knee replacements, sinus surgery. He has sizzling in the right ear brief, a few seconds then resolves. He hears clicking. A hissing noise. It happens every day. He also has eye symptoms associated feels his eyes flutter during the ear nose (happened today and I couldn't see it on exam), moving his head to the right improves the symptoms. The eye sensation is a fluttering sensation and vision changes. Symptoms started in 2013 or earlier. Talking makes it worse and noise does too. His eyes don't move and his lids dont close he just feels like his vision is impaired during those 5 seconds of the noise in the right ear. He has been evaluated extensively by ENT and had his eyes checked without etiology. Noise exacerbates symptoms and can be triggered by noise. Something crunchy can cause it as well when he chews. Symptoms happens on and off all day long. No one can notice it.the vision changes when they look at him but they make him  feel unsteady. No dizziness or lightheadedness. Varies in intensity.  More frequent, progressive for 5 years. Noise or sound is a trigger. No other focal neurologic deficits, associated symptoms, inciting events or modifiable factors.  Reviewed notes, labs and imaging from outside physicians, which showed:  Reviewed notes from Dr. Fayrene Fearing crossly. Patient was seen by neurology to go and placed on Trileptal for sharp pain behind his right ear. He has had several episodes a day. This led sometimes flutter. He tried Lyrica but developed a rash. Tilting his head to the right improves the discomfort he had an MRI done at [facility] in 2012 showed a small bony spurring K. Diminutive right vertebral artery, large left vertebral artery, basilar artery pressing the medulla on the right aspect of the pons. His problems tend to be returning in terms of ENT where he had a recent CAT scan which showed mucosal thickening within the frontal sinus opacification of some of the ethmoid air cells, fluid or mucosal thickening noted in the maxillary sinuses bilateral sinus partially consistent with sinusitis. He was sent to neurology for evaluation of eye issue, visual interruption that he complains about where. His eyelids flicker, affecting his vision someone, and constant ticking and hissing sensation in his right ear.  Reviewed neurologist notes Dr. love from 2013. At that time is complaining of bilateral tinnitus. In October 2011 he developed a sound in his right ear. He initially hears a chirping sound followed by a frying, cracking or popping sounds like water on hot grease. This can be associated with imbalance and eye fluttering sensation, squinting of his eyes  and tightness in the right side of his face and head. It lasts about 5 seconds and then will recur in 30 seconds. It was on and off through January 2012. In January 2012 he was bitten by a tick in one month later he woke with a fever of 103 and was hospitalized with  a heart murmur at [facility]. MRI of the brain in 2012 with and without contrast showed no internal auditory canal enhancement, asymmetry of medical St., small bony spur in the right Meckel's Jae Dire, diminutive right vertebral artery, large left vertebral artery and basilar artery pressing the medulla and right aspect. He had surgery in 6 weeks of IV vancomycin for his sinus disease during which time the sound, visual disturbance, imbalance and tightness of the right side improved. He recurred in July 2012 and was worse than ever. He cannot drive a car. He has a crick in sound in his right ear and sharp pain in the right side of his face which he treats with goody powders. Symptoms worse if he lays on his side. It is better if he lays on his right side. Symptoms decrease if he leans his head to the right or opens his mouth and tilts his head to the right. He can of sharp pain above his right eye. He was seeing Dr. Haroldine Laws at the time and audiogram was normal and CT of the temporal bones without contrast was normal except for sinus he was placed on Trileptal with good results. Dr. love reportedly thought that his symptoms involve at least 2 cranial nerves and possibly the cerebellum. There are mechanical features to his symptoms such as tilting his head to the right that improves the discomfort. He had no episodes during exam. Trileptal 150 mg twice daily was continued.  Review of Systems: Patient complains of symptoms per HPI as well as the following symptoms: Blurred vision, wheezing, ringing in ears, allergies, runny nose, snoring. Pertinent negatives per HPI. All others negative.   Social History   Social History  . Marital status: Married    Spouse name: N/A  . Number of children: 2  . Years of education: 14   Occupational History  . retired Architect, Public affairs consultant work    Social History Main Topics  . Smoking status: Former Smoker    Packs/day: 1.00    Years: 5.00    Types:  Cigarettes    Quit date: 12/14/1968  . Smokeless tobacco: Never Used  . Alcohol use 0.6 oz/week    1 Cans of beer per week     Comment: Occasional  . Drug use: No  . Sexual activity: Not on file     Comment: Married   Other Topics Concern  . Not on file   Social History Narrative   Patient lives at home with his wife . He is retired. Patient has 14 years of education. Patient has two children.   Caffeine - two cups daily.   Right-handed       Family History  Problem Relation Age of Onset  . Dementia Father   . Alzheimer's disease Father   . Heart disease Mother     Onset-Late 45s  . Heart disease Brother 34  . Cancer Paternal Aunt   . Cancer Paternal Uncle   . Cancer Paternal Uncle   . Cancer Paternal Uncle     Past Medical History:  Diagnosis Date  . Arthritis    "hips, lower back, knees, shoulders, hands" (05/14/2016)  . Asthma  dx'd in 2014  . Bacterial septicemia (HCC) 01/2011   "both knee involved"  . CAD (coronary artery disease)    a. NSTEMI 02/2016 s/p DES to LAD. b. Relook cath due to angina/prior high risk anatomy 05/14/16 -> continued medical therapy given concern that a stent strut could inferere with Cx itself.  Marland Kitchen GERD (gastroesophageal reflux disease)   . Gilbert's disease   . Hyperlipidemia   . Hypertension   . 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 NSTEMI (non-ST elevated myocardial infarction) (HCC) 02/2016   [name] 03/02/2016  . Sinus bradycardia   . Unspecified tinnitus    right    Past Surgical History:  Procedure Laterality Date  . BACK SURGERY    . CARDIAC CATHETERIZATION N/A 03/01/2016   Procedure: Left Heart Cath and Coronary Angiography;  Surgeon: Tonny Bollman, MD;  oLAD 95%, CFX/RI/RCA/RPDA 25-30%  . CARDIAC CATHETERIZATION N/A 03/01/2016   Procedure: Coronary Stent Intervention;  Surgeon: Tonny Bollman, MD;  Promus Premier 4.0 x  12 mm DES to oLAD  . CARDIAC CATHETERIZATION N/A 05/14/2016   Procedure: Left Heart Cath and Coronary  Angiography;  Surgeon: Lennette Bihari, MD;  Location: [facility] INVASIVE CV LAB;  Service: Cardiovascular;  Laterality: N/A;  . CARDIAC CATHETERIZATION N/A 06/17/2016   Procedure: Intravascular Pressure Wire/FFR Study;  Surgeon: Tonny Bollman, MD;  Location: [facility] INVASIVE CV LAB;  Service: Cardiovascular;  Laterality: N/A;  . CARDIOVASCULAR STRESS TEST  05/26/2012   EKG negative for ischemia, no ECG changes, no significant ischemia noted  . I&D KNEE WITH POLY EXCHANGE Bilateral 01/2011   & complete synovectomy of 3 compartments of the right total knee/notes 02/04/2011 (replacement,partial knee replacement because of bacterial infection)  . JOINT REPLACEMENT    . KNEE ARTHROSCOPY Right ~ 2002  . LUMBAR DISC SURGERY  ~ 1980; 2000  . NASAL SINUS SURGERY  08/2011  . SHOULDER ARTHROSCOPY W/ ROTATOR CUFF REPAIR Right 01/2015  . TOTAL KNEE ARTHROPLASTY Bilateral 03/26/2004-09/15/2004    right-left  . TRANSTHORACIC ECHOCARDIOGRAM  05/26/2012   EF >55%, mild concentric LVH    Current Outpatient Prescriptions  Medication Sig Dispense Refill  . aspirin 81 MG tablet Take 81 mg by mouth at bedtime.     Marland Kitchen atorvastatin (LIPITOR) 80 MG tablet TAKE 1 TABLET (80 MG TOTAL) BY MOUTH DAILY AT 6 PM. 30 tablet 0  . Dextromethorphan Polistirex (DELSYM PO) Take by mouth 2 (two) times daily as needed.    . diphenhydrAMINE (BENADRYL) 25 MG tablet Take 25 mg by mouth every 6 (six) hours as needed for allergies.    . fluticasone (FLONASE) 50 MCG/ACT nasal spray Use one spray in each nostril twice daily 16 g 3  . Garlic 1000 MG CAPS Take 1,000 mg by mouth daily.     . isosorbide mononitrate (IMDUR) 60 MG 24 hr tablet Take 60 mg by mouth 2 (two) times daily.   10  . losartan (COZAAR) 50 MG tablet Take 50 mg by mouth daily.   10  . mometasone-formoterol (DULERA) 200-5 MCG/ACT AERO Inhale 1-2 puffs into the lungs 2 (two) times daily.    . Multiple Vitamin (MULTIVITAMIN WITH MINERALS) TABS tablet Take 1 tablet by mouth daily.    .  nitroGLYCERIN (NITROSTAT) 0.4 MG SL tablet Place 1 tablet (0.4 mg total) under the tongue every 5 (five) minutes x 3 doses as needed for chest pain. 25 tablet 12  . omeprazole (PRILOSEC) 40 MG capsule Take 1 capsule (40 mg total) by mouth  2 (two) times daily. 60 capsule 2  . OXcarbazepine ER (OXTELLAR XR) 300 MG TB24 Take 300 mg by mouth at bedtime. 30 tablet 12  . Pseudoephedrine-Guaifenesin (MUCINEX D MAX STRENGTH PO) Take 1,200 mg by mouth 2 (two) times daily.    Marland Kitchen RANEXA 1000 MG SR tablet TAKE 1 TABLET (1,000 MG TOTAL) BY MOUTH 2 (TWO) TIMES DAILY. 60 tablet 1  . ranitidine (ZANTAC) 300 MG tablet Take one tablet each evening as directed (Patient taking differently: Take 300 mg by mouth at bedtime. ) 30 tablet 5   No current facility-administered medications for this visit.     Allergies as of 03/23/2017 - Review Complete 03/22/2017  Allergen Reaction Noted  . Oxycontin [oxycodone hcl] Anaphylaxis 06/02/2013  . Percocet [oxycodone-acetaminophen] Anaphylaxis 07/06/2013  . Ambien [zolpidem tartrate] Other (See Comments) 07/06/2013    Vitals: There were no vitals taken for this visit. Last Weight:  Wt Readings from Last 1 Encounters:  03/22/17 199 lb 9.6 oz (90.5 kg)   Last Height:   Ht Readings from Last 1 Encounters:  03/22/17 6' 2.5" (1.892 m)    Neuro: Detailed Neurologic Exam  Speech:    Speech is normal; fluent and spontaneous with normal comprehension.  Cognition:    The patient is oriented to person, place, and time;     recent and remote memory intact;     language fluent;     normal attention, concentration,     fund of knowledge Cranial Nerves:    The pupils are equal, round, and reactive to light. The fundi are normal and spontaneous venous pulsations are present. Visual fields are full to finger confrontation. Extraocular movements are intact. Trigeminal sensation is intact and the muscles of mastication are normal. The face is symmetric. The palate elevates in  the midline. Hearing intact. Voice is normal. Shoulder shrug is normal. The tongue has normal motion without fasciculations.   Coordination:    Normal finger to nose and heel to shin. Normal rapid alternating movements.   Gait:    Heel-toe and tandem gait are normal.   Motor Observation:    No asymmetry, no atrophy, and no involuntary movements noted. Tone:    Normal muscle tone.    Posture:    Posture is normal. normal erect    Strength:    Strength is V/V in the upper and lower limbs.      Sensation: intact to LT     Reflex Exam:  DTR's:    Deep tendon reflexes in the upper and lower extremities are normal bilaterally.   Toes:    The toes are downgoing bilaterally.   Clonus:    Clonus is absent.     Assessment/Plan:  66 year old with episodes of hearing noises associated with vision changes and imbalance of unclear etiology triggered by loud noises. Its possible there is a vestibulo-occular etiology, as Imbalance and vision changes has been documented to be triggered by loud noises in certain conditions, causing changes in ear pressure and endolymph movement with resultant triggering of the vestibular system with eye oscillopsia. (as an example  Tullio phenomenon which refers to sound-induced disequilibrium or oscillopsia, Tullio patients describe disequilibrium, auditory and visual symptoms, which are recurrent, brief, and often triggered by loud noises or middle ear pressure changes. There have been cases of this phenomenon without superior canal dehiscence:  Neurol. 2012 Jan;259(1):4-21. 2011 Jul 9. The Tullio phenomenon: a neurologically neglected presentation. [place] D1, [address] [place] L, [place] AM, [place] P.).  MRI brain w/wo contrast w thin cuts through the IACs to rule out other causes of symptoms including vestibular lesions was unremarkable Previously documented in 2013 that Trileptal helped with symptoms. Will try this again (Oxtellar XR) will start at  /day and can increase to /day as needed. 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    Cc: Orlando Penner, MD  [facility] [address] [place], [place] [address]  Phone [phone] Fax [phone]  A total of 25 minutes was spent face-to-face with this patient. Over half this time was spent on counseling patient on the vestibulo-occular symptoms diagnosis and different diagnostic and therapeutic options available.

## 2017-03-23 NOTE — Telephone Encounter (Signed)
REFILL 

## 2017-03-23 NOTE — Patient Instructions (Signed)
Remember to drink plenty of fluid, eat healthy meals and do not skip any meals. Try to eat protein with a every meal and eat a healthy snack such as fruit or nuts in between meals. Try to keep a regular sleep-wake schedule and try to exercise daily, particularly in the form of walking, 20-30 minutes a day, if you can.   As far as diagnostic testing: Dedicated CAT scan of the temporal bones  Our phone number is [phone]. We also have an after hours call service for urgent matters and there is a physician on-call for urgent questions. For any emergencies you know to call 911 or go to the nearest emergency room

## 2017-03-29 ENCOUNTER — Telehealth: Payer: Self-pay | Admitting: Allergy and Immunology

## 2017-03-29 ENCOUNTER — Ambulatory Visit
Admission: RE | Admit: 2017-03-29 | Discharge: 2017-03-29 | Disposition: A | Discharge status: Home / Self Care | Payer: Medicare Other | Source: Ambulatory Visit | Attending: Neurology | Admitting: Neurology

## 2017-03-29 DIAGNOSIS — J329 Chronic sinusitis, unspecified: Secondary | ICD-10-CM | POA: Diagnosis not present

## 2017-03-29 DIAGNOSIS — H838X1 Other specified diseases of right inner ear: Secondary | ICD-10-CM

## 2017-03-29 MED ORDER — NYSTATIN 100000 UNIT/ML MT SUSP: OROMUCOSAL | 0 refills | Status: DC

## 2017-03-29 NOTE — Telephone Encounter (Signed)
nystatin oral solution 5 ML's swish and spit 3 times a day for 20 days

## 2017-03-29 NOTE — Telephone Encounter (Signed)
Please advise 

## 2017-03-29 NOTE — Telephone Encounter (Signed)
Called patient, and informed him that we sent in Nystain oral solution mouth wash in. 5 ml swish and spit 3 times a day for 20 days.

## 2017-03-29 NOTE — Telephone Encounter (Signed)
Pt came by to see if he could get you to call in the Nystatin oral solution mouth wash [facility] in [place]. [phone]

## 2017-03-30 ENCOUNTER — Telehealth: Payer: Self-pay

## 2017-03-30 NOTE — Telephone Encounter (Signed)
-----   Message from Anson Fret, MD sent at 03/29/2017  1:43 PM EDT ----- CT Scan is negative, normal. No cause for his symptoms seen. I would consider having his primary care send him to neurology at [facility] or [facility] as I am unclear shy he is having his symptoms. I can refer him as well if he likes. thanks

## 2017-03-30 NOTE — Telephone Encounter (Signed)
Called pt w/ negative CT results. Verbalized understanding. Would like to proceed w/ neuro referral to [facility] or [facility] Says that he currently does not have a PCP as his retired. Voiced appreciation for call.

## 2017-03-31 ENCOUNTER — Other Ambulatory Visit: Payer: Self-pay | Admitting: Neurology

## 2017-03-31 DIAGNOSIS — R2689 Other abnormalities of gait and mobility: Secondary | ICD-10-CM

## 2017-03-31 DIAGNOSIS — H8190 Unspecified disorder of vestibular function, unspecified ear: Secondary | ICD-10-CM

## 2017-03-31 NOTE — Telephone Encounter (Signed)
I think [facility] would be fine to start. I'll place the referral thanks

## 2017-04-05 ENCOUNTER — Other Ambulatory Visit: Payer: Self-pay | Admitting: Internal Medicine

## 2017-04-16 DIAGNOSIS — Z7902 Long term (current) use of antithrombotics/antiplatelets: Secondary | ICD-10-CM | POA: Diagnosis not present

## 2017-04-16 DIAGNOSIS — Z7951 Long term (current) use of inhaled steroids: Secondary | ICD-10-CM | POA: Diagnosis not present

## 2017-04-16 DIAGNOSIS — Z886 Allergy status to analgesic agent status: Secondary | ICD-10-CM | POA: Diagnosis not present

## 2017-04-16 DIAGNOSIS — Z955 Presence of coronary angioplasty implant and graft: Secondary | ICD-10-CM | POA: Diagnosis not present

## 2017-04-16 DIAGNOSIS — I252 Old myocardial infarction: Secondary | ICD-10-CM | POA: Diagnosis not present

## 2017-04-16 DIAGNOSIS — R51 Headache: Secondary | ICD-10-CM | POA: Diagnosis not present

## 2017-04-16 DIAGNOSIS — I2510 Atherosclerotic heart disease of native coronary artery without angina pectoris: Secondary | ICD-10-CM | POA: Diagnosis not present

## 2017-04-16 DIAGNOSIS — Z888 Allergy status to other drugs, medicaments and biological substances status: Secondary | ICD-10-CM | POA: Diagnosis not present

## 2017-04-16 DIAGNOSIS — J45909 Unspecified asthma, uncomplicated: Secondary | ICD-10-CM | POA: Diagnosis not present

## 2017-04-16 DIAGNOSIS — H9313 Tinnitus, bilateral: Secondary | ICD-10-CM | POA: Diagnosis not present

## 2017-04-16 DIAGNOSIS — Z87891 Personal history of nicotine dependence: Secondary | ICD-10-CM | POA: Diagnosis not present

## 2017-04-16 DIAGNOSIS — I10 Essential (primary) hypertension: Secondary | ICD-10-CM | POA: Diagnosis not present

## 2017-04-16 DIAGNOSIS — Z79899 Other long term (current) drug therapy: Secondary | ICD-10-CM | POA: Diagnosis not present

## 2017-04-16 DIAGNOSIS — Z7982 Long term (current) use of aspirin: Secondary | ICD-10-CM | POA: Diagnosis not present

## 2017-04-16 DIAGNOSIS — E785 Hyperlipidemia, unspecified: Secondary | ICD-10-CM | POA: Diagnosis not present

## 2017-04-16 DIAGNOSIS — H93293 Other abnormal auditory perceptions, bilateral: Secondary | ICD-10-CM | POA: Diagnosis not present

## 2017-04-16 DIAGNOSIS — R110 Nausea: Secondary | ICD-10-CM | POA: Diagnosis not present

## 2017-04-19 ENCOUNTER — Other Ambulatory Visit: Payer: Self-pay

## 2017-04-19 MED ORDER — RANOLAZINE ER 1000 MG PO TB12: 1000.0000 mg | ORAL_TABLET | Freq: Two times a day (BID) | ORAL | 10 refills | Status: DC

## 2017-04-19 MED ORDER — ATORVASTATIN CALCIUM 80 MG PO TABS: 80.0000 mg | ORAL_TABLET | Freq: Every day | ORAL | 10 refills | Status: DC

## 2017-04-22 ENCOUNTER — Other Ambulatory Visit: Payer: Self-pay | Admitting: *Deleted

## 2017-04-22 MED ORDER — RANOLAZINE ER 1000 MG PO TB12: 1000.0000 mg | ORAL_TABLET | Freq: Two times a day (BID) | ORAL | 10 refills | Status: DC

## 2017-04-28 ENCOUNTER — Other Ambulatory Visit: Payer: Self-pay

## 2017-04-28 MED ORDER — RANOLAZINE ER 1000 MG PO TB12: 1000.0000 mg | ORAL_TABLET | Freq: Two times a day (BID) | ORAL | 3 refills | Status: DC

## 2017-05-11 DIAGNOSIS — R448 Other symptoms and signs involving general sensations and perceptions: Secondary | ICD-10-CM | POA: Diagnosis not present

## 2017-05-11 DIAGNOSIS — H539 Unspecified visual disturbance: Secondary | ICD-10-CM | POA: Diagnosis not present

## 2017-05-11 DIAGNOSIS — H9311 Tinnitus, right ear: Secondary | ICD-10-CM | POA: Diagnosis not present

## 2017-06-01 ENCOUNTER — Ambulatory Visit (INDEPENDENT_AMBULATORY_CARE_PROVIDER_SITE_OTHER): Payer: Medicare Other | Admitting: Allergy and Immunology

## 2017-06-01 ENCOUNTER — Encounter: Payer: Self-pay | Admitting: Allergy and Immunology

## 2017-06-01 VITALS — BP 112/72 | HR 82 | Resp 16

## 2017-06-01 DIAGNOSIS — I200 Unstable angina: Secondary | ICD-10-CM | POA: Diagnosis not present

## 2017-06-01 DIAGNOSIS — J3089 Other allergic rhinitis: Secondary | ICD-10-CM

## 2017-06-01 DIAGNOSIS — J324 Chronic pansinusitis: Secondary | ICD-10-CM

## 2017-06-01 DIAGNOSIS — J4540 Moderate persistent asthma, uncomplicated: Secondary | ICD-10-CM | POA: Diagnosis not present

## 2017-06-01 DIAGNOSIS — G4709 Other insomnia: Secondary | ICD-10-CM

## 2017-06-01 DIAGNOSIS — J984 Other disorders of lung: Secondary | ICD-10-CM

## 2017-06-01 DIAGNOSIS — K219 Gastro-esophageal reflux disease without esophagitis: Secondary | ICD-10-CM

## 2017-06-01 MED ORDER — CYPROHEPTADINE HCL 4 MG PO TABS: ORAL_TABLET | ORAL | 5 refills | Status: DC

## 2017-06-01 MED ORDER — METHYLPREDNISOLONE ACETATE 80 MG/ML IJ SUSP
80.0000 mg | Freq: Once | INTRAMUSCULAR | Status: AC
  Administered 2017-06-01: 80 mg via INTRAMUSCULAR

## 2017-06-01 MED ORDER — FLUTICASONE PROPIONATE 50 MCG/ACT NA SUSP: NASAL | 3 refills | Status: DC

## 2017-06-01 MED ORDER — ALBUTEROL SULFATE HFA 108 (90 BASE) MCG/ACT IN AERS: 2.0000 | INHALATION_SPRAY | RESPIRATORY_TRACT | 3 refills | Status: DC | PRN

## 2017-06-01 MED ORDER — MUPIROCIN 2 % EX OINT: 1.0000 "application " | TOPICAL_OINTMENT | Freq: Three times a day (TID) | CUTANEOUS | 1 refills | Status: DC

## 2017-06-01 MED ORDER — RANITIDINE HCL 300 MG PO TABS: 300.0000 mg | ORAL_TABLET | Freq: Every day | ORAL | 5 refills | Status: DC

## 2017-06-01 MED ORDER — MOMETASONE FURO-FORMOTEROL FUM 100-5 MCG/ACT IN AERO: 2.0000 | INHALATION_SPRAY | Freq: Two times a day (BID) | RESPIRATORY_TRACT | 5 refills | Status: DC

## 2017-06-01 MED ORDER — MONTELUKAST SODIUM 10 MG PO TABS: 10.0000 mg | ORAL_TABLET | Freq: Every day | ORAL | 5 refills | Status: DC

## 2017-06-01 MED ORDER — OMEPRAZOLE 40 MG PO CPDR: 40.0000 mg | DELAYED_RELEASE_CAPSULE | Freq: Two times a day (BID) | ORAL | 5 refills | Status: DC

## 2017-06-01 NOTE — Patient Instructions (Addendum)
  1. Continue Allergen avoidance measures  2. Continue to Treat and prevent inflammation:   A. DECREASE Dulera 100 - 2 inhalations 2 x day with spacer  B. Flonase - one spray each nostril twice per day  C. montelukast 10 mg - one tablet one time per day  D. Depo-Medrol 80 IM delivered in clinic today  3. Continue toTreat reflux:   A. Remain away from caffeine  B. omeprazole 40 mg twice a day  C. ranitidine 300 mg in evening  4. Start Periactin 4 mg - one or 2 tablets at bedtime  5. Start Bactroban applied inside nose up to 3 times per day to treat "sore"  6. If needed:   A. ProAir HFA 2 puffs every 4-6 hours  B. nasal saline  C. OTC antihistamine - Claritin 10 mg daily  7. Return in 6 months or earlier if problem

## 2017-06-01 NOTE — Progress Notes (Signed)
Follow-up Note  Referring Provider: No ref. provider found Primary Provider: Bernita BuffyWilliams, Breejante J, PA-C Date of Office Visit: 06/01/2017  Subjective:   Louis Perez Situ (DOB: [birth year]) is a 66 y.o. 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 I last saw him in this clinic in March 2018 at which point in time he was doing very well regarding all his respiratory tract issues while utilizing a plan directed against inflammation and reflux.  He has continued to do very well and rarely uses a short acting bronchodilator and does not have a tremendous amount of shortness of breath or dyspnea. However, over the past week he has noticed that he has developed some issues with hoarseness and some throat clearing and a glob or lump in his throat. He has not really had a tremendous amount of upper airway symptoms associated with this issue. He believes that his reflux is under excellent control at this point in time.  He has one other issue he would like to discuss today. He has rather significant insomnia with inability to maintain sleep throughout the night. He does not have any gasping or awakening with a respiratory event or choking. He does not really snore to any degree and apparently does not have any apneic-like issues.  Finally, he has noticed that he develops a little bit of a sore inside his nose on both sides that appears to be a relatively persistent issue. It waxes and wanes in intensity but never really completely resolves. This has also been a long-standing issue.  Allergies as of 06/01/2017      Reactions   Oxycontin [oxycodone Hcl] Anaphylaxis   Percocet [oxycodone-acetaminophen] Anaphylaxis   Tolerates Vicodin   Ambien [zolpidem  Tartrate] Other (See Comments)   Mood changes       Medication List      aspirin 81 MG tablet Take 81 mg by mouth at bedtime.   atorvastatin 80 MG tablet Commonly known as:  LIPITOR Take 1 tablet (80 mg total) by mouth daily at 6 PM.   DELSYM PO Take by mouth 2 (two) times daily as needed.   diphenhydrAMINE 25 MG tablet Commonly known as:  BENADRYL Take 25 mg by mouth every 6 (six) hours as needed for allergies.   DULERA 200-5 MCG/ACT Aero Generic drug:  mometasone-formoterol Inhale 1-2 puffs into the lungs 2 (two) times daily.   fluticasone 50 MCG/ACT nasal spray Commonly known as:  FLONASE Use one spray in each nostril twice daily   Garlic 1000 MG Caps Take 1,000 mg by mouth daily.   isosorbide mononitrate 60 MG 24 hr tablet Commonly known as:  IMDUR Take 60 mg by mouth 2 (two) times daily.   losartan 50 MG tablet Commonly known as:  COZAAR TAKE 1 TABLET (50 MG TOTAL) BY MOUTH DAILY.   montelukast 10 MG tablet Commonly known as:  SINGULAIR   MUCINEX D MAX STRENGTH PO Take 1,200 mg by mouth 2 (two) times daily.   multivitamin with minerals Tabs tablet Take 1 tablet by mouth daily.   nitroGLYCERIN 0.4 MG SL tablet Commonly known as:  NITROSTAT Place 1 tablet (0.4 mg total) under the tongue every 5 (five) minutes x 3 doses as needed for chest pain.   nortriptyline 10 MG capsule  Commonly known as:  PAMELOR Take 10 mg by mouth.   nystatin 100000 UNIT/ML suspension Commonly known as:  MYCOSTATIN 5 ml swish and spit three times a day for 20 days.   omeprazole 40 MG capsule Commonly known as:  PRILOSEC Take 1 capsule (40 mg total) by mouth 2 (two) times daily.   OXcarbazepine ER 300 MG Tb24 Commonly known as:  OXTELLAR XR Take 300 mg by mouth at bedtime.   ranitidine 300 MG tablet Commonly known as:  ZANTAC Take one tablet each evening as directed       Past Medical History:  Diagnosis Date  . Arthritis    "hips, lower back, knees, shoulders,  hands" (05/14/2016)  . Asthma dx'd in 2014  . Bacterial septicemia (HCC) 01/2011   "both knee involved"  . CAD (coronary artery disease)    a. NSTEMI 02/2016 s/p DES to LAD. b. Relook cath due to angina/prior high risk anatomy 05/14/16 -> continued medical therapy given concern that a stent strut could inferere with Cx itself.  Marland Kitchen GERD (gastroesophageal reflux disease)   . Gilbert's disease   . Hyperlipidemia   . Hypertension   . 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 NSTEMI (non-ST elevated myocardial infarction) (HCC) 02/2016   [name] 03/02/2016  . Sinus bradycardia   . Unspecified tinnitus    right    Past Surgical History:  Procedure Laterality Date  . BACK SURGERY    . CARDIAC CATHETERIZATION N/A 03/01/2016   Procedure: Left Heart Cath and Coronary Angiography;  Surgeon: Tonny Bollman, MD;  oLAD 95%, CFX/RI/RCA/RPDA 25-30%  . CARDIAC CATHETERIZATION N/A 03/01/2016   Procedure: Coronary Stent Intervention;  Surgeon: Tonny Bollman, MD;  Promus Premier 4.0 x  12 mm DES to oLAD  . CARDIAC CATHETERIZATION N/A 05/14/2016   Procedure: Left Heart Cath and Coronary Angiography;  Surgeon: Lennette Bihari, MD;  Location: [facility] INVASIVE CV LAB;  Service: Cardiovascular;  Laterality: N/A;  . CARDIAC CATHETERIZATION N/A 06/17/2016   Procedure: Intravascular Pressure Wire/FFR Study;  Surgeon: Tonny Bollman, MD;  Location: [facility] INVASIVE CV LAB;  Service: Cardiovascular;  Laterality: N/A;  . CARDIOVASCULAR STRESS TEST  05/26/2012   EKG negative for ischemia, no ECG changes, no significant ischemia noted  . I&D KNEE WITH POLY EXCHANGE Bilateral 01/2011   & complete synovectomy of 3 compartments of the right total knee/notes 02/04/2011 (replacement,partial knee replacement because of bacterial infection)  . JOINT REPLACEMENT    . KNEE ARTHROSCOPY Right ~ 2002  . LUMBAR DISC SURGERY  ~ 1980; 2000  . NASAL SINUS SURGERY  08/2011  . SHOULDER ARTHROSCOPY W/ ROTATOR CUFF REPAIR Right 01/2015  . TOTAL KNEE  ARTHROPLASTY Bilateral 03/26/2004-09/15/2004    right-left  . TRANSTHORACIC ECHOCARDIOGRAM  05/26/2012   EF >55%, mild concentric LVH    Review of systems negative except as noted in HPI / PMHx or noted below:  Review of Systems  Constitutional: Negative.   HENT: Negative.   Eyes: Negative.   Respiratory: Negative.   Cardiovascular: Negative.   Gastrointestinal: Negative.   Genitourinary: Negative.   Musculoskeletal: Negative.   Skin: Negative.   Neurological: Negative.   Endo/Heme/Allergies: Negative.   Psychiatric/Behavioral: Negative.      Objective:   Vitals:   06/01/17 1105  BP: 112/72  Pulse: 82  Resp: 16          Physical Exam  Constitutional: He is well-developed, well-nourished, and in no distress.  HENT:  Head: Normocephalic.  Right Ear: Tympanic membrane,  external ear and ear canal normal.  Left Ear: Tympanic membrane, external ear and ear canal normal.  Nose: Nose normal. No mucosal edema (no obvious mucosal abnormality) or rhinorrhea.  Mouth/Throat: Uvula is midline, oropharynx is clear and moist and mucous membranes are normal. No oropharyngeal exudate.  Eyes: Conjunctivae are normal.  Neck: Trachea normal. No tracheal tenderness present. No tracheal deviation present. No thyromegaly present.  Cardiovascular: Normal rate, regular rhythm, S1 normal, S2 normal and normal heart sounds.   No murmur heard. Pulmonary/Chest: Breath sounds normal. No stridor. No respiratory distress. He has no wheezes. He has no rales.  Musculoskeletal: He exhibits no edema.  Lymphadenopathy:       Head (right side): No tonsillar adenopathy present.       Head (left side): No tonsillar adenopathy present.    He has no cervical adenopathy.  Neurological: He is alert. Gait normal.  Skin: No rash noted. He is not diaphoretic. No erythema. Nails show no clubbing.  Psychiatric: Mood and affect normal.    Diagnostics:    Spirometry was performed and demonstrated an FEV1 of  2.54 at 87 % of predicted.  The patient had an Asthma Control Test with the following results: ACT Total Score: 19.    Assessment and Plan:   1. Asthma, moderate persistent, well-controlled   2. Restrictive lung disease   3. Chronic pansinusitis   4. Other allergic rhinitis   5. LPRD (laryngopharyngeal reflux disease)   6. Other insomnia     1. Continue Allergen avoidance measures  2. Continue to Treat and prevent inflammation:   A. DECREASE Dulera 100 - 2 inhalations 2 x day with spacer  B. Flonase - one spray each nostril twice per day  C. montelukast 10 mg - one tablet one time per day  D. Depo-Medrol 80 IM delivered in clinic today  3. Continue toTreat reflux:   A. Remain away from caffeine  B. omeprazole 40 mg twice a day  C. ranitidine 300 mg in evening  4. Start Periactin 4 mg - one or 2 tablets at bedtime  5. Start Bactroban applied inside nose up to 3 times per day to treat "sore"  6. If needed:   A. ProAir HFA 2 puffs every 4-6 hours  B. nasal saline  C. OTC antihistamine - Claritin 10 mg daily  7. Return in 6 months or earlier if problem  I'm going to give Ramon Dredge a systemic steroid to help with any respiratory tract inflammation that may be contributing to his symptoms and as well I'm going to decrease his dose of Dulera today as he may be developing some issue with laryngeal myopathy with the use of his Dulera 200. I have given him Bactroban to apply in his nose to treat his "sore" that waxes and wanes throughout the year. I'm going to start him on Periactin at nighttime to address his insomnia and he will start off with 4 mg and he can increase to 8 mg should a higher dose be required. If he does well I will see him back in this clinic in 6 months or earlier if there is a problem.  Laurette Schimke, MD Allergy / Immunology [facility]

## 2017-06-02 ENCOUNTER — Telehealth: Payer: Self-pay | Admitting: *Deleted

## 2017-06-02 NOTE — Telephone Encounter (Signed)
Dr Lucie LeatherKozlow cyproheptadine 4mg  is not covered it is not on formulary. Please advise what to switch patient to.

## 2017-06-02 NOTE — Telephone Encounter (Signed)
No alternative. Should be generic and very cheap. Needs to call around and find cheapest price.

## 2017-06-02 NOTE — Telephone Encounter (Signed)
Pt advised. He is out of town and will call around to other pharmacies when he returns. He will give us a call when he finds an alternative pharmacy so we can resend the rx.

## 2017-07-12 ENCOUNTER — Other Ambulatory Visit: Payer: Self-pay | Admitting: Allergy and Immunology

## 2017-07-20 ENCOUNTER — Other Ambulatory Visit: Payer: Self-pay | Admitting: Internal Medicine

## 2017-07-21 NOTE — Telephone Encounter (Signed)
Rx(s) sent to pharmacy electronically.  

## 2017-07-22 DIAGNOSIS — Z87891 Personal history of nicotine dependence: Secondary | ICD-10-CM | POA: Diagnosis not present

## 2017-07-22 DIAGNOSIS — Z79899 Other long term (current) drug therapy: Secondary | ICD-10-CM | POA: Diagnosis not present

## 2017-07-22 DIAGNOSIS — H9311 Tinnitus, right ear: Secondary | ICD-10-CM | POA: Diagnosis not present

## 2017-08-02 ENCOUNTER — Encounter: Payer: Self-pay | Admitting: Internal Medicine

## 2017-08-02 ENCOUNTER — Ambulatory Visit (INDEPENDENT_AMBULATORY_CARE_PROVIDER_SITE_OTHER): Payer: Medicare Other | Admitting: Internal Medicine

## 2017-08-02 VITALS — BP 126/90 | HR 69 | Ht 74.5 in | Wt 207.0 lb

## 2017-08-02 DIAGNOSIS — I200 Unstable angina: Secondary | ICD-10-CM | POA: Diagnosis not present

## 2017-08-02 DIAGNOSIS — E785 Hyperlipidemia, unspecified: Secondary | ICD-10-CM | POA: Diagnosis not present

## 2017-08-02 DIAGNOSIS — I10 Essential (primary) hypertension: Secondary | ICD-10-CM

## 2017-08-02 DIAGNOSIS — I2510 Atherosclerotic heart disease of native coronary artery without angina pectoris: Secondary | ICD-10-CM

## 2017-08-02 MED ORDER — NITROGLYCERIN 0.4 MG SL SUBL: 0.4000 mg | SUBLINGUAL_TABLET | SUBLINGUAL | 3 refills | Status: DC | PRN

## 2017-08-02 NOTE — Progress Notes (Signed)
Cardiology Office Note   Date:  08/02/2017   ID:  Louis Perez, DOB 1951-07-05, MRN [id number]  PCP:  Roger Kill, PA-C  Cardiologist:   Chrystie Nose, MD   Chief Complaint  Patient presents with  . Follow-up  . Shortness of Breath  . Chest Pain      History of Present Illness: Louis Perez is a 66 y.o. male who presents for evaluation of chest and back pain. Patient has a history of coronary disease with recent NSTEMI in March with DES stenting of the LAD.  He had non obstructive disease elsewhere.  EF was normal.    He was added to my schedule today for evaluation of chest and arm discomfort. This began on Sunday. It is similar to his previous unstable angina but not as severe. Still he describes the peak of his discomfort at 7 out of 10 in intensity. He gets across his chest. His left arm hurt. At times he has noticed some diaphoresis. It might last for only a minute. He has taken nitroglycerin and over the course of the last few days and it improves the symptoms. He describes it as somewhat heavy. He's not had any palpitations, presyncope or syncope. He's not describing any new shortness of breath, PND or orthopnea. He has brought the discomfort on a few times when he's been doing activities although this is been intermittent. He was actually able to do a little golfing yesterday without symptoms. However, prior to this he had discomfort with golfing building his children sandbox. This morning the pain was at rest. He still describes a little mid chest tightness.  Of note he has not had the symptoms since his stent.  I did review his catheterization. I looked at the images and he had a very high grade proximal LAD in March with nonobstructive disease in the right coronary.  05/28/2016  Louis Perez is a 66 year old patient who I last saw in 2014. He did not see me in follow-up since that time and is seen a number of other providers in our practice, ultimately  leading to presentation with an STEMI in March with a placement of a drug-eluting stent in the proximal LAD. Subsequent to that he had chest pain as described above and was referred back to the hospital for coronary evaluation. Catheterization indicated an 80% ostial ramus lesion at the area of partially jailed vessel due to the proximal LAD stent. It was felt that intervention could compromise the LAD therefore medical therapy was recommended. His isosorbide was doubled to 60 mg daily. Since then he reports he has had some improvement in chest discomfort but has significant headache.  06/15/2016  Louis Perez was seen back today in the office for chest pain. When I last saw him we had increased his isosorbide up to 60 mg daily and he's been on Ranexa thousand milligrams twice a day. He reports he continues to have episodes of chest discomfort which can occur at rest or with exertion. I've advised that he stop cardiac rehabilitation until we can determine whether or not his pain is related to coronary obstruction.  07/23/2016  Louis Perez returns for follow-up today. He underwent repeat cardiac catheterization by Dr. Excell Seltzer. This showed the following:  Conclusion    Prox RCA to Mid RCA lesion, 25% stenosed.  Ost LM lesion, 25% stenosed.  Ost Ramus lesion, 75% stenosed.  Mid LAD to Dist LAD lesion, 50% stenosed.  Ost 2nd Diag  to 2nd Diag lesion, 30% stenosed.  1. Continued patency of the proximal LAD stent 2. Mild diffuse nonobstructive RCA stenosis with diffuse coronary ectasia 3. Patent left circumflex 4. Moderate-severe stenosis of the ramus intermedius with negative pressure wire analysis (FFR = 0.92 baseline and 0.85 at peak hyperemia)  Recommend: resume cardiac rehab, medical therapy   No intervention was performed. Since discharge she notes that he's had no further chest pain. He feels like the change in his isosorbide is been helpful. He is about to finish card agreeable dictation  and will continue to exercise at the [facility]. He's lost about 5-7 pounds and is well within the normal weight range at this time.  01/04/2017  Louis Perez returns today for follow-up. He reports that initially had some improvement with increasing his medical therapy for angina but still continues to have sharp, intermittent chest pains on a daily basis. This is typically in the right sternal area. He would not clearly say that his symptoms are progressively getting worse. Blood pressure is well-controlled today. His LDL cholesterol is 93 on 80 mg Lipitor. I advised him that his goal cholesterol should be much lower and that we may have difficulty reaching that since he is on a high potency statin. Options include adding ezetimibe or perhaps enrolling him in the Orion-10 trial. He did seem to be interested in the study medication. His last lipid profile was earlier this year and he will be due for repeat blood work. He is also had problems with asthma and has seen Dr. Sherene Sires for evaluation of this and to include cyclical shortness of breath.  03/22/2017  Louis Perez returns today for follow-up. He is doing fairly well denies any recurrent chest pain. Unfortunately has an upper respiratory infection and is had a flare of his asthma recently. Cholesterol is much better control with LDL now down to 64 on recent lab work with an LDL particle number less than 1000. ApoB and LPA level both at goal as well. It's now been over one year since his drug-eluting stent. He underwent a relook catheterization which showed a patent stent a couple months after his initial procedure for ongoing chest pain. He is also on Ranexa and isosorbide. He is interested in coming off some of his medicines.  08/02/2017  Louis Perez returns today for follow-up. He reports he occasionally gets some chest discomfort but its quick, sharp and atypical for coronary disease. He's found nothing to be limiting of his activities. He weaned himself off  Ranexa by his request. He remains on isosorbide 60 mg twice a day. He has not required short acting nitroglycerin. Overall he feels fairly well. His energy level is good. He is on atorvastatin 80 mg with an LDL C a less than 70. Blood pressure is at goal.  Past Medical History:  Diagnosis Date  . Arthritis    "hips, lower back, knees, shoulders, hands" (05/14/2016)  . Asthma dx'd in 2014  . Bacterial septicemia (HCC) 01/2011   "both knee involved"  . CAD (coronary artery disease)    a. NSTEMI 02/2016 s/p DES to LAD. b. Relook cath due to angina/prior high risk anatomy 05/14/16 -> continued medical therapy given concern that a stent strut could inferere with Cx itself.  Marland Kitchen GERD (gastroesophageal reflux disease)   . Gilbert's disease   . Hyperlipidemia   . Hypertension   . 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 NSTEMI (non-ST elevated myocardial infarction) (HCC) 02/2016   [name] 03/02/2016  .  Sinus bradycardia   . Unspecified tinnitus    right    Past Surgical History:  Procedure Laterality Date  . BACK SURGERY    . CARDIAC CATHETERIZATION N/A 03/01/2016   Procedure: Left Heart Cath and Coronary Angiography;  Surgeon: Tonny Bollman, MD;  oLAD 95%, CFX/RI/RCA/RPDA 25-30%  . CARDIAC CATHETERIZATION N/A 03/01/2016   Procedure: Coronary Stent Intervention;  Surgeon: Tonny Bollman, MD;  Promus Premier 4.0 x  12 mm DES to oLAD  . CARDIAC CATHETERIZATION N/A 05/14/2016   Procedure: Left Heart Cath and Coronary Angiography;  Surgeon: Lennette Bihari, MD;  Location: [facility] INVASIVE CV LAB;  Service: Cardiovascular;  Laterality: N/A;  . CARDIAC CATHETERIZATION N/A 06/17/2016   Procedure: Intravascular Pressure Wire/FFR Study;  Surgeon: Tonny Bollman, MD;  Location: [facility] INVASIVE CV LAB;  Service: Cardiovascular;  Laterality: N/A;  . CARDIOVASCULAR STRESS TEST  05/26/2012   EKG negative for ischemia, no ECG changes, no significant ischemia noted  . I&D KNEE WITH POLY EXCHANGE Bilateral 01/2011   & complete  synovectomy of 3 compartments of the right total knee/notes 02/04/2011 (replacement,partial knee replacement because of bacterial infection)  . JOINT REPLACEMENT    . KNEE ARTHROSCOPY Right ~ 2002  . LUMBAR DISC SURGERY  ~ 1980; 2000  . NASAL SINUS SURGERY  08/2011  . SHOULDER ARTHROSCOPY W/ ROTATOR CUFF REPAIR Right 01/2015  . TOTAL KNEE ARTHROPLASTY Bilateral 03/26/2004-09/15/2004    right-left  . TRANSTHORACIC ECHOCARDIOGRAM  05/26/2012   EF >55%, mild concentric LVH     Current Outpatient Prescriptions  Medication Sig Dispense Refill  . albuterol (PROAIR HFA) 108 (90 Base) MCG/ACT inhaler Inhale 2 puffs into the lungs every 4 (four) hours as needed for wheezing or shortness of breath. 1 Inhaler 3  . aspirin 81 MG tablet Take 81 mg by mouth at bedtime.     Marland Kitchen atorvastatin (LIPITOR) 80 MG tablet Take 1 tablet (80 mg total) by mouth daily at 6 PM. 30 tablet 10  . cyproheptadine (PERIACTIN) 4 MG tablet 1-2 tablets at bedtime 60 tablet 5  . fluticasone (FLONASE) 50 MCG/ACT nasal spray Use one spray in each nostril twice daily 16 g 3  . fluticasone (FLONASE) 50 MCG/ACT nasal spray USE ONE SPRAY IN EACH NOSTRIL TWICE DAILY 16 g 5  . Garlic 1000 MG CAPS Take 1,000 mg by mouth daily.     . isosorbide mononitrate (IMDUR) 60 MG 24 hr tablet Take 60 mg by mouth 2 (two) times daily.   10  . losartan (COZAAR) 50 MG tablet TAKE 1 TABLET (50 MG TOTAL) BY MOUTH DAILY. 30 tablet 5  . mometasone-formoterol (DULERA) 100-5 MCG/ACT AERO Inhale 2 puffs into the lungs 2 (two) times daily. 1 Inhaler 5  . montelukast (SINGULAIR) 10 MG tablet Take 1 tablet (10 mg total) by mouth at bedtime. 30 tablet 5  . Multiple Vitamin (MULTIVITAMIN WITH MINERALS) TABS tablet Take 1 tablet by mouth daily.    . mupirocin ointment (BACTROBAN) 2 % Place 1 application into the nose 3 (three) times daily. 30 g 1  . nitroGLYCERIN (NITROSTAT) 0.4 MG SL tablet Place 1 tablet (0.4 mg total) under the tongue every 5 (five) minutes x 3 doses  as needed for chest pain. 25 tablet 3  . omeprazole (PRILOSEC) 40 MG capsule Take 1 capsule (40 mg total) by mouth 2 (two) times daily. 60 capsule 5  . OXcarbazepine ER (OXTELLAR XR) 300 MG TB24 Take 300 mg by mouth at bedtime. 30 tablet 12  . Pseudoephedrine-Guaifenesin (  MUCINEX D MAX STRENGTH PO) Take 1,200 mg by mouth 2 (two) times daily.    . ranitidine (ZANTAC) 300 MG tablet Take 1 tablet (300 mg total) by mouth at bedtime. 30 tablet 5   No current facility-administered medications for this visit.     Allergies:   Oxycontin [oxycodone hcl]; Percocet [oxycodone-acetaminophen]; and Ambien [zolpidem tartrate]    Social History:  The patient  reports that he quit smoking about 48 years ago. His smoking use included Cigarettes. He has a 5.00 pack-year smoking history. He has never used smokeless tobacco. He reports that he drinks about 0.6 oz of alcohol per week . He reports that he does not use drugs.   Family History:  The patient's family history includes Alzheimer's disease in his father; Cancer in his paternal aunt, paternal uncle, paternal uncle, and paternal uncle; Dementia in his father; Heart disease in his mother; Heart disease (age of onset: 12) in his brother.    ROS:   Pertinent items noted in HPI and remainder of comprehensive ROS otherwise negative.  PHYSICAL EXAM: VS:  BP 126/90   Pulse 69   Ht 6' 2.5" (1.892 m)   Wt 207 lb (93.9 kg)   BMI 26.22 kg/m  , BMI Body mass index is 26.22 kg/m. General appearance: alert and no distress Neck: no carotid bruit, no JVD and thyroid not enlarged, symmetric, no tenderness/mass/nodules Lungs: clear to auscultation bilaterally Heart: regular rate and rhythm and S1, S2 normal Abdomen: soft, non-tender; bowel sounds normal; no masses,  no organomegaly Extremities: extremities normal, atraumatic, no cyanosis or edema Pulses: 2+ and symmetric Skin: Skin color, texture, turgor normal. No rashes or lesions Neurologic: Grossly  normal Psych: Pleasant  EKG:   Sinus rhythm at 69-personally reviewed  Recent Labs: 02/22/2017: ALT 143; BUN 16; Creatinine, Ser 0.87; Hemoglobin 16.5; Platelets 201; Potassium 3.9; Sodium 137    Lipid Panel    Component Value Date/Time   CHOL 136 01/08/2017 0833   TRIG 109 01/08/2017 0833   HDL 53 01/08/2017 0833   CHOLHDL 2.6 01/08/2017 0833   CHOLHDL 3.0 03/01/2016 0448   VLDL 17 03/01/2016 0448   LDLCALC 64 01/08/2017 0833      Wt Readings from Last 3 Encounters:  08/02/17 207 lb (93.9 kg)  03/23/17 202 lb 3.2 oz (91.7 kg)  03/22/17 199 lb 9.6 oz (90.5 kg)    ASSESSMENT AND PLAN:  Patient Active Problem List   Diagnosis Date Noted  . Pansinusitis 01/06/2017  . Hyperlipidemia 07/23/2016  . Anginal chest pain at rest [facility]) 06/17/2016  . Coronary artery disease involving native coronary artery of native heart with unstable angina pectoris (HCC) 06/15/2016  . Stented coronary artery 06/15/2016  . Pain in the chest 05/28/2016  . Sinus bradycardia 05/15/2016  . Unstable angina (HCC) 05/14/2016  . CAD (coronary artery disease), native coronary artery 05/14/2016  . Dyslipidemia 09/28/2013  . GERD (gastroesophageal reflux disease) 09/28/2013  . 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 Moderate persistent chronic asthma with acute exacerbation 06/05/2013  . Essential hypertension 06/05/2013   PLAN: Louis Perez to be doing very well. He is not having any recurrent or significant chest pain. He denies any worsening shortness of breath. Cholesterol is at goal. He is on appropriate medications. Blood pressure is at goal.  Follow-up with me annually or sooner as necessary.  Chrystie Nose, MD, [facility] Attending Cardiologist [facility]  Chrystie Nose, MD  08/02/2017 5:12 PM

## 2017-08-02 NOTE — Patient Instructions (Signed)
Medication Instructions:  Continue current medications  If you need a refill on your cardiac medications before your next appointment, please call your pharmacy.  Labwork: None Ordered   Testing/Procedures: None Ordered  Follow-Up: Your physician wants you to follow-up in: 1 Year. You should receive a reminder letter in the mail two months in advance. If you do not receive a letter, please call our office [phone].    Thank you for choosing [facility]!!      

## 2017-08-04 ENCOUNTER — Other Ambulatory Visit: Payer: Self-pay | Admitting: Allergy and Immunology

## 2017-08-12 ENCOUNTER — Telehealth: Payer: Self-pay | Admitting: Allergy and Immunology

## 2017-08-12 NOTE — Telephone Encounter (Signed)
Called and spoke with patient and asked what prescription he was referring to and he said the depo shot. I informed him that he would need to have an office visit to been seen for the depo shot. He said he was just having sinus congestion and his breathing has been acting up and coughing some, so I told him to continue his Dulera and montelukast as well as his Flonase and he could also take mucinex. He stated he was starting to do all of those.

## 2017-08-12 NOTE — Telephone Encounter (Signed)
Patient called an made an appt for 08-25-17, with Dr. Lucie LeatherKozlow. He asked what he was prescribed recently for his congestion. He would like a call back to possibly get this filled. Last seen 06-01-17.

## 2017-08-17 ENCOUNTER — Ambulatory Visit (INDEPENDENT_AMBULATORY_CARE_PROVIDER_SITE_OTHER): Payer: Medicare Other | Admitting: Allergy and Immunology

## 2017-08-17 VITALS — BP 160/100 | HR 74 | Resp 16

## 2017-08-17 DIAGNOSIS — J3089 Other allergic rhinitis: Secondary | ICD-10-CM

## 2017-08-17 DIAGNOSIS — D721 Eosinophilia, unspecified: Secondary | ICD-10-CM

## 2017-08-17 DIAGNOSIS — G4709 Other insomnia: Secondary | ICD-10-CM | POA: Diagnosis not present

## 2017-08-17 DIAGNOSIS — J324 Chronic pansinusitis: Secondary | ICD-10-CM | POA: Diagnosis not present

## 2017-08-17 DIAGNOSIS — J4541 Moderate persistent asthma with (acute) exacerbation: Secondary | ICD-10-CM

## 2017-08-17 DIAGNOSIS — I200 Unstable angina: Secondary | ICD-10-CM

## 2017-08-17 DIAGNOSIS — K219 Gastro-esophageal reflux disease without esophagitis: Secondary | ICD-10-CM | POA: Diagnosis not present

## 2017-08-17 MED ORDER — IPRATROPIUM-ALBUTEROL 0.5-2.5 (3) MG/3ML IN SOLN: 3.0000 mL | Freq: Four times a day (QID) | RESPIRATORY_TRACT | 3 refills | Status: DC | PRN

## 2017-08-17 MED ORDER — CYPROHEPTADINE HCL 4 MG PO TABS: ORAL_TABLET | ORAL | 5 refills | Status: DC

## 2017-08-17 NOTE — Patient Instructions (Addendum)
  1. Nebulized albuterol and ipratropium delivered in clinic  2. Continue to Treat and prevent inflammation:   A. Symbicort 160 - 2 inhalations 2 x day with spacer  B. Flonase - one spray each nostril twice per day  C. montelukast 10 mg - one tablet one time per day  D. Prednisone 10mg  - 4 now, then 2 every day for 10 days   3. Continue to Treat reflux:   A. Remain away from caffeine  B. omeprazole 40 mg twice a day  C. ranitidine 300 mg in evening  4. Continue Periactin 4 mg - one or 2 tablets at bedtime  5. Biological agent?  6. If needed:   A. ProAir HFA 2 puffs or duoneb every 4-6 hours  B. nasal saline  C. OTC antihistamine - Claritin 10 mg daily  7. Return in 12 weeks or earlier if problem

## 2017-08-17 NOTE — Progress Notes (Signed)
Follow-up Note  Referring Provider: Roger Kill, * Primary Provider: Bernita Buffy Date of Office Visit: 08/17/2017  Subjective:   Louis Perez (DOB: 1951-06-01) is a 66 y.o. male who [facility] in re-evaluation of the following:  HPI: Ed returns to this clinic in reevaluation of his asthma and allergic rhinitis and history of chronic sinusitis and LPR and insomnia addressed during his last evaluation of 06/01/2017.  He did quite well with therapy administered during her last visit regarding his respiratory tract symptoms but unfortunately over the course the past 2 weeks he has developed significant coughing and wheezing. His bronchodilator hass not really improving this issue. He does continue to use his Symbicort on a consistent basis. There was no obvious provoking factor giving rise to this flareup.  His nose is doing quite well at this point. He can smell and taste without any difficulty and does not have significant congestion or discharge. The sores that were located inside his nose have since resolved with his Bactroban treatment.  His throat has been doing very well. He does not believe that he has any issues with reflux at this point in time.  He had rather significant insomnia during his last evaluation in this clinic and I gave him cyproheptadine to utilize for this issue. He has noticed that he has able to maintain sleep much better now on his cyproheptadine.  Allergies as of 08/17/2017      Reactions   Oxycontin [oxycodone Hcl] Anaphylaxis   Percocet [oxycodone-acetaminophen] Anaphylaxis   Tolerates Vicodin   Ambien [zolpidem Tartrate] Other (See Comments)   Mood changes       Medication List      albuterol 108 (90 Base) MCG/ACT inhaler Commonly known as:  PROAIR HFA Inhale 2 puffs into the lungs every 4 (four) hours as needed for wheezing or shortness of breath.   aspirin 81 MG  tablet Take 81 mg by mouth at bedtime.   atorvastatin 80 MG tablet Commonly known as:  LIPITOR Take 1 tablet (80 mg total) by mouth daily at 6 PM.   cyproheptadine 4 MG tablet Commonly known as:  PERIACTIN 1-2 tablets at bedtime   fluticasone 50 MCG/ACT nasal spray Commonly known as:  FLONASE Use one spray in each nostril twice daily   Garlic 1000 MG Caps Take 1,000 mg by mouth daily.   isosorbide mononitrate 60 MG 24 hr tablet Commonly known as:  IMDUR Take 60 mg by mouth 2 (two) times daily.   losartan 50 MG tablet Commonly known as:  COZAAR TAKE 1 TABLET (50 MG TOTAL) BY MOUTH DAILY.   mometasone-formoterol 100-5 MCG/ACT Aero Commonly known as:  DULERA Inhale 2 puffs into the lungs 2 (two) times daily.   montelukast 10 MG tablet Commonly known as:  SINGULAIR Take 1 tablet (10 mg total) by mouth at bedtime.   MUCINEX D MAX STRENGTH PO Take 1,200 mg by mouth 2 (two) times daily.   multivitamin with minerals Tabs tablet Take 1 tablet by mouth daily.   nitroGLYCERIN 0.4 MG SL tablet Commonly known as:  NITROSTAT Place 1 tablet (0.4 mg total) under the tongue every 5 (five) minutes x 3 doses as needed for chest pain.   omeprazole 40 MG capsule Commonly known as:  PRILOSEC Take 1 capsule (40 mg total) by mouth 2 (two) times daily.   OXcarbazepine ER 300 MG Tb24 Commonly known as:  OXTELLAR XR Take 300  mg by mouth at bedtime.   ranitidine 300 MG tablet Commonly known as:  ZANTAC Take 1 tablet (300 mg total) by mouth at bedtime.       Past Medical History:  Diagnosis Date  . Arthritis    "hips, lower back, knees, shoulders, hands" (05/14/2016)  . Asthma dx'd in 2014  . Bacterial septicemia (HCC) 01/2011   "both knee involved"  . CAD (coronary artery disease)    a. NSTEMI 02/2016 s/p DES to LAD. b. Relook cath due to angina/prior high risk anatomy 05/14/16 -> continued medical therapy given concern that a stent strut could inferere with Cx itself.  Marland Kitchen GERD  (gastroesophageal reflux disease)   . Gilbert's disease   . Hyperlipidemia   . Hypertension   . 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 NSTEMI (non-ST elevated myocardial infarction) (HCC) 02/2016   [name] 03/02/2016  . Sinus bradycardia   . Unspecified tinnitus    right    Past Surgical History:  Procedure Laterality Date  . BACK SURGERY    . CARDIAC CATHETERIZATION N/A 03/01/2016   Procedure: Left Heart Cath and Coronary Angiography;  Surgeon: Tonny Bollman, MD;  oLAD 95%, CFX/RI/RCA/RPDA 25-30%  . CARDIAC CATHETERIZATION N/A 03/01/2016   Procedure: Coronary Stent Intervention;  Surgeon: Tonny Bollman, MD;  Promus Premier 4.0 x  12 mm DES to oLAD  . CARDIAC CATHETERIZATION N/A 05/14/2016   Procedure: Left Heart Cath and Coronary Angiography;  Surgeon: Lennette Bihari, MD;  Location: [facility] INVASIVE CV LAB;  Service: Cardiovascular;  Laterality: N/A;  . CARDIAC CATHETERIZATION N/A 06/17/2016   Procedure: Intravascular Pressure Wire/FFR Study;  Surgeon: Tonny Bollman, MD;  Location: [facility] INVASIVE CV LAB;  Service: Cardiovascular;  Laterality: N/A;  . CARDIOVASCULAR STRESS TEST  05/26/2012   EKG negative for ischemia, no ECG changes, no significant ischemia noted  . I&D KNEE WITH POLY EXCHANGE Bilateral 01/2011   & complete synovectomy of 3 compartments of the right total knee/notes 02/04/2011 (replacement,partial knee replacement because of bacterial infection)  . JOINT REPLACEMENT    . KNEE ARTHROSCOPY Right ~ 2002  . LUMBAR DISC SURGERY  ~ 1980; 2000  . NASAL SINUS SURGERY  08/2011  . SHOULDER ARTHROSCOPY W/ ROTATOR CUFF REPAIR Right 01/2015  . TOTAL KNEE ARTHROPLASTY Bilateral 03/26/2004-09/15/2004    right-left  . TRANSTHORACIC ECHOCARDIOGRAM  05/26/2012   EF >55%, mild concentric LVH    Review of systems negative except as noted in HPI / PMHx or noted below:  Review of Systems  Constitutional: Negative.   HENT: Negative.   Eyes: Negative.   Respiratory: Negative.   Cardiovascular:  Negative.   Gastrointestinal: Negative.   Genitourinary: Negative.   Musculoskeletal: Negative.   Skin: Negative.   Neurological: Negative.   Endo/Heme/Allergies: Negative.   Psychiatric/Behavioral: Negative.      Objective:   Vitals:   08/18/17 0851  BP: (!) 160/100  Pulse: 74  Resp: 16          Physical Exam  Constitutional: He is well-developed, well-nourished, and in no distress.  HENT:  Head: Normocephalic.  Right Ear: Tympanic membrane, external ear and ear canal normal.  Left Ear: Tympanic membrane, external ear and ear canal normal.  Nose: Nose normal. No mucosal edema or rhinorrhea.  Mouth/Throat: Uvula is midline, oropharynx is clear and moist and mucous membranes are normal. No oropharyngeal exudate.  Eyes: Conjunctivae are normal.  Neck: Trachea normal. No tracheal tenderness present. No tracheal deviation present. No thyromegaly present.  Cardiovascular: Normal  rate, regular rhythm, S1 normal, S2 normal and normal heart sounds.   No murmur heard. Pulmonary/Chest: No stridor. No respiratory distress. He has wheezes (bilateral expiratory wheezes all lung fields). He has no rales.  Musculoskeletal: He exhibits no edema.  Lymphadenopathy:       Head (right side): No tonsillar adenopathy present.       Head (left side): No tonsillar adenopathy present.    He has no cervical adenopathy.  Neurological: He is alert. Gait normal.  Skin: No rash noted. He is not diaphoretic. No erythema. Nails show no clubbing.  Psychiatric: Mood and affect normal.    Diagnostics:    Spirometry was performed and demonstrated an FEV1 of 1.68 at 40 % of predicted.  Assessment and Plan:   1. Asthma, not well controlled, moderate persistent, with acute exacerbation   2. Chronic pansinusitis   3. Other allergic rhinitis   4. LPRD (laryngopharyngeal reflux disease)   5. Other insomnia   6. Eosinophilia     1. Nebulized ipratropium and albuterol delivered in clinic  2.  Continue to Treat and prevent inflammation:   A. Symbicort 160 - 2 inhalations 2 x day with spacer  B. Flonase - one spray each nostril twice per day  C. montelukast 10 mg - one tablet one time per day  D. Prednisone 10mg  - 4 now, then 2 every day for 10 days   3. Continue to Treat reflux:   A. Remain away from caffeine  B. omeprazole 40 mg twice a day  C. ranitidine 300 mg in evening  4. Continue Periactin 4 mg - one or 2 tablets at bedtime  5. Biological agent?  6. If needed:   A. ProAir HFA 2 puffs or duoneb every 4-6 hours  B. nasal saline  C. OTC antihistamine - Claritin 10 mg daily  7. Return in 12 weeks or earlier if problem  Although Ed has had pretty good response regarding therapy directed against his upper airway disease and his insomnia and his reflux but his asthma is still active. He has required systemic steroids several times in the past year and I think he is probably a candidate for a biological agent and we will see if he qualifies for that form of therapy based upon his insurance coverage. We have already documented his eosinophilia and his elevated IgE which would qualify him for both Xolair or an anti-IL-5 agent. He will keep in contact with me noting his response to the therapy noted above and I will see him back in this clinic in 12 weeks or earlier if there is a problem.  Laurette SchimkeEric Davit Vassar, MD Allergy / Immunology [facility]

## 2017-08-18 ENCOUNTER — Encounter: Payer: Self-pay | Admitting: Allergy and Immunology

## 2017-08-19 ENCOUNTER — Other Ambulatory Visit: Payer: Self-pay

## 2017-08-19 ENCOUNTER — Telehealth: Payer: Self-pay | Admitting: Allergy and Immunology

## 2017-08-19 MED ORDER — IPRATROPIUM-ALBUTEROL 0.5-2.5 (3) MG/3ML IN SOLN: 3.0000 mL | Freq: Four times a day (QID) | RESPIRATORY_TRACT | 3 refills | Status: DC | PRN

## 2017-08-19 NOTE — Telephone Encounter (Signed)
Spoke to patient to let him know that the medication is at the pharmacy

## 2017-08-19 NOTE — Telephone Encounter (Signed)
Pt called and said that the neb solution was not called into [facility]  [phone].

## 2017-08-25 ENCOUNTER — Ambulatory Visit: Payer: Medicare Other | Admitting: Allergy and Immunology

## 2017-09-07 ENCOUNTER — Telehealth: Payer: Self-pay | Admitting: *Deleted

## 2017-09-07 NOTE — Telephone Encounter (Signed)
Called patient and discussed buy and bill Nucala per Dr Lucie Leather. This will be the most affordable solution for patient and advised him as long as his insurance does not change he should have $0 copay. Have already submitted to insurance for approval and patient advised he received call that same was approved.  I advised him I will submit test claim and be in touch with him in the next couple weeks to schedule start

## 2017-09-10 DIAGNOSIS — J4550 Severe persistent asthma, uncomplicated: Secondary | ICD-10-CM | POA: Diagnosis not present

## 2017-09-23 DIAGNOSIS — H918X1 Other specified hearing loss, right ear: Secondary | ICD-10-CM | POA: Diagnosis not present

## 2017-10-16 IMAGING — DX DG CHEST 2V
2 series · 2 of 2 positions shown · non-contrast
Comparison: 02/29/2016

CLINICAL DATA: Chest pain and epigastric pain tonight. Shortness of
breath and nausea.

EXAM:
CHEST  2 VIEW

[chest pa]
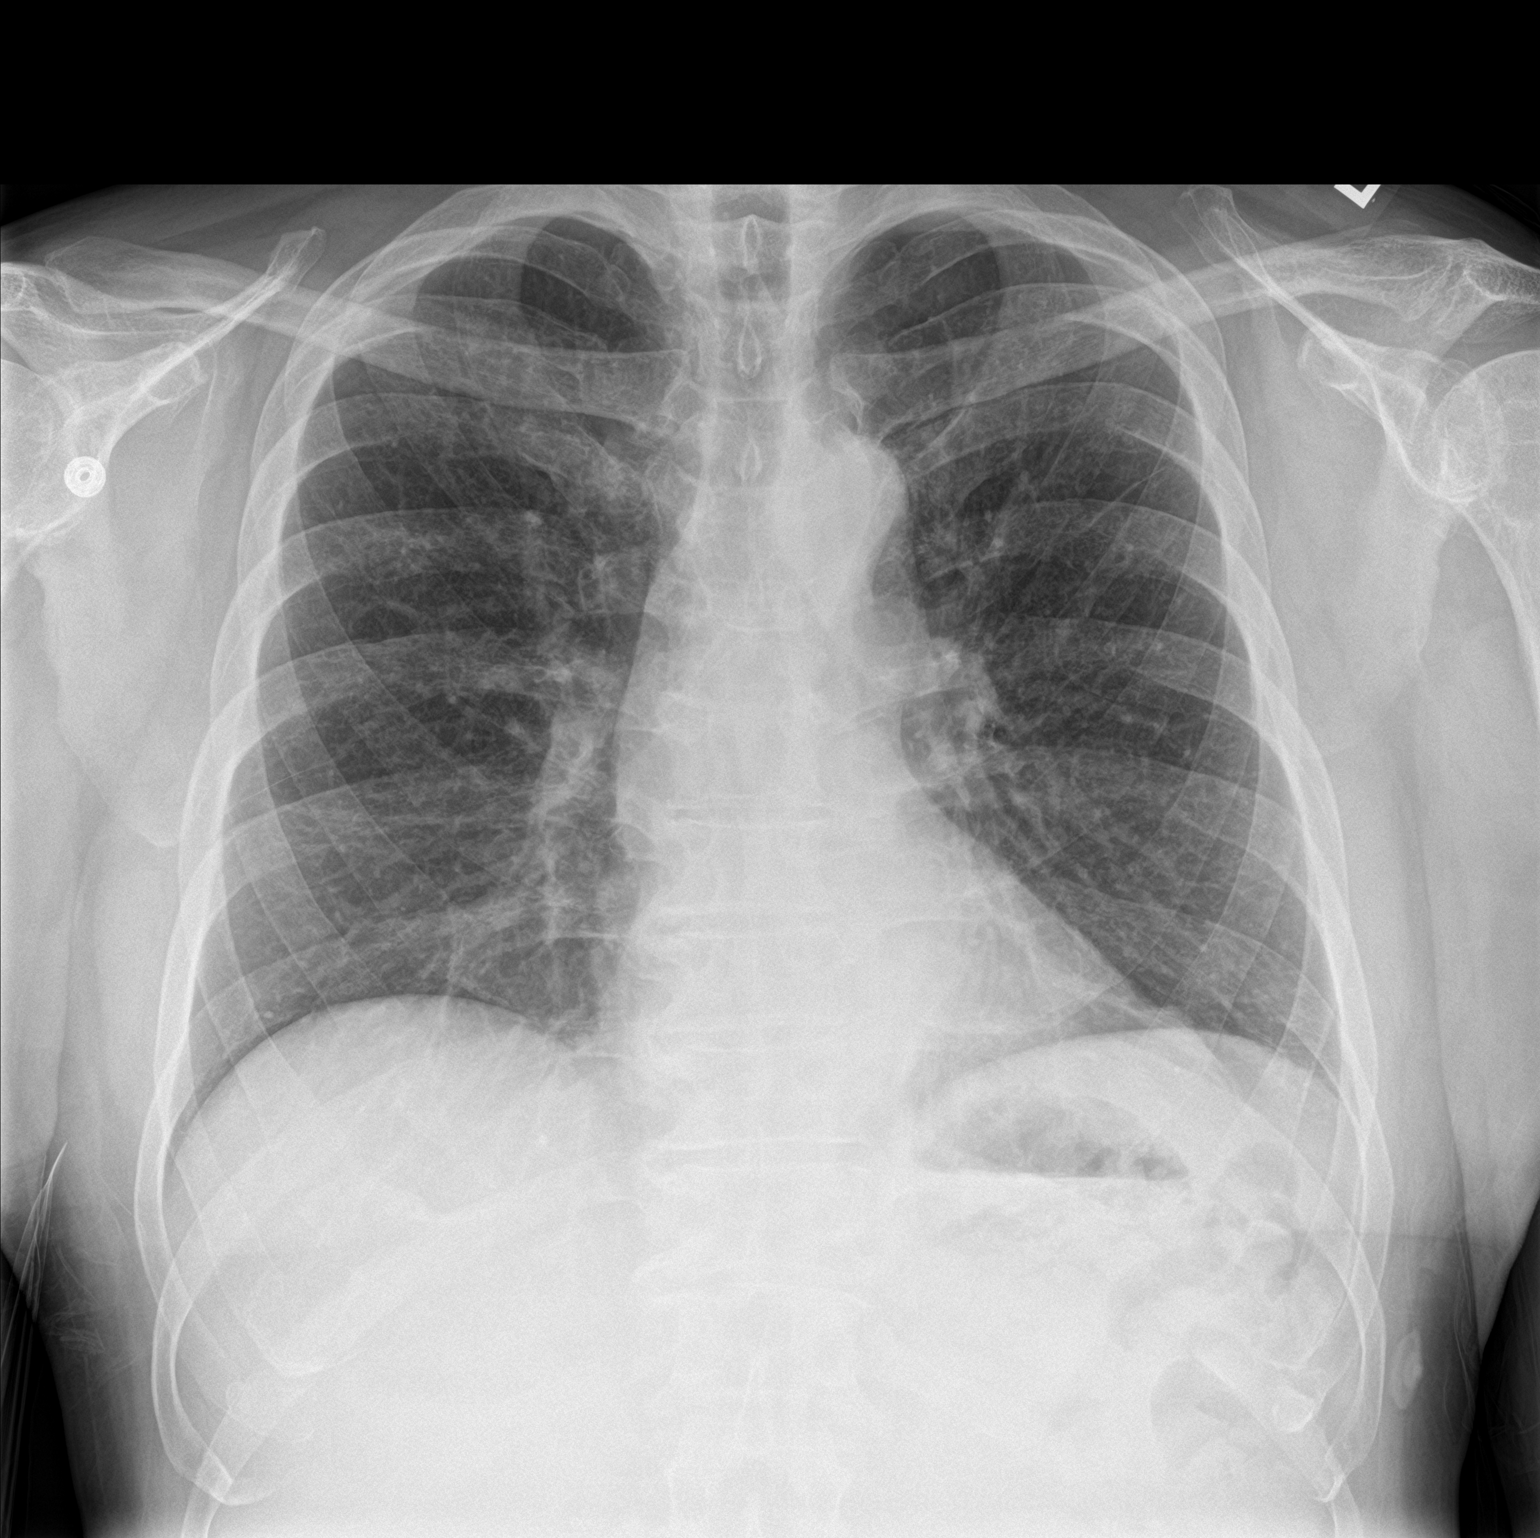

[chest lat]
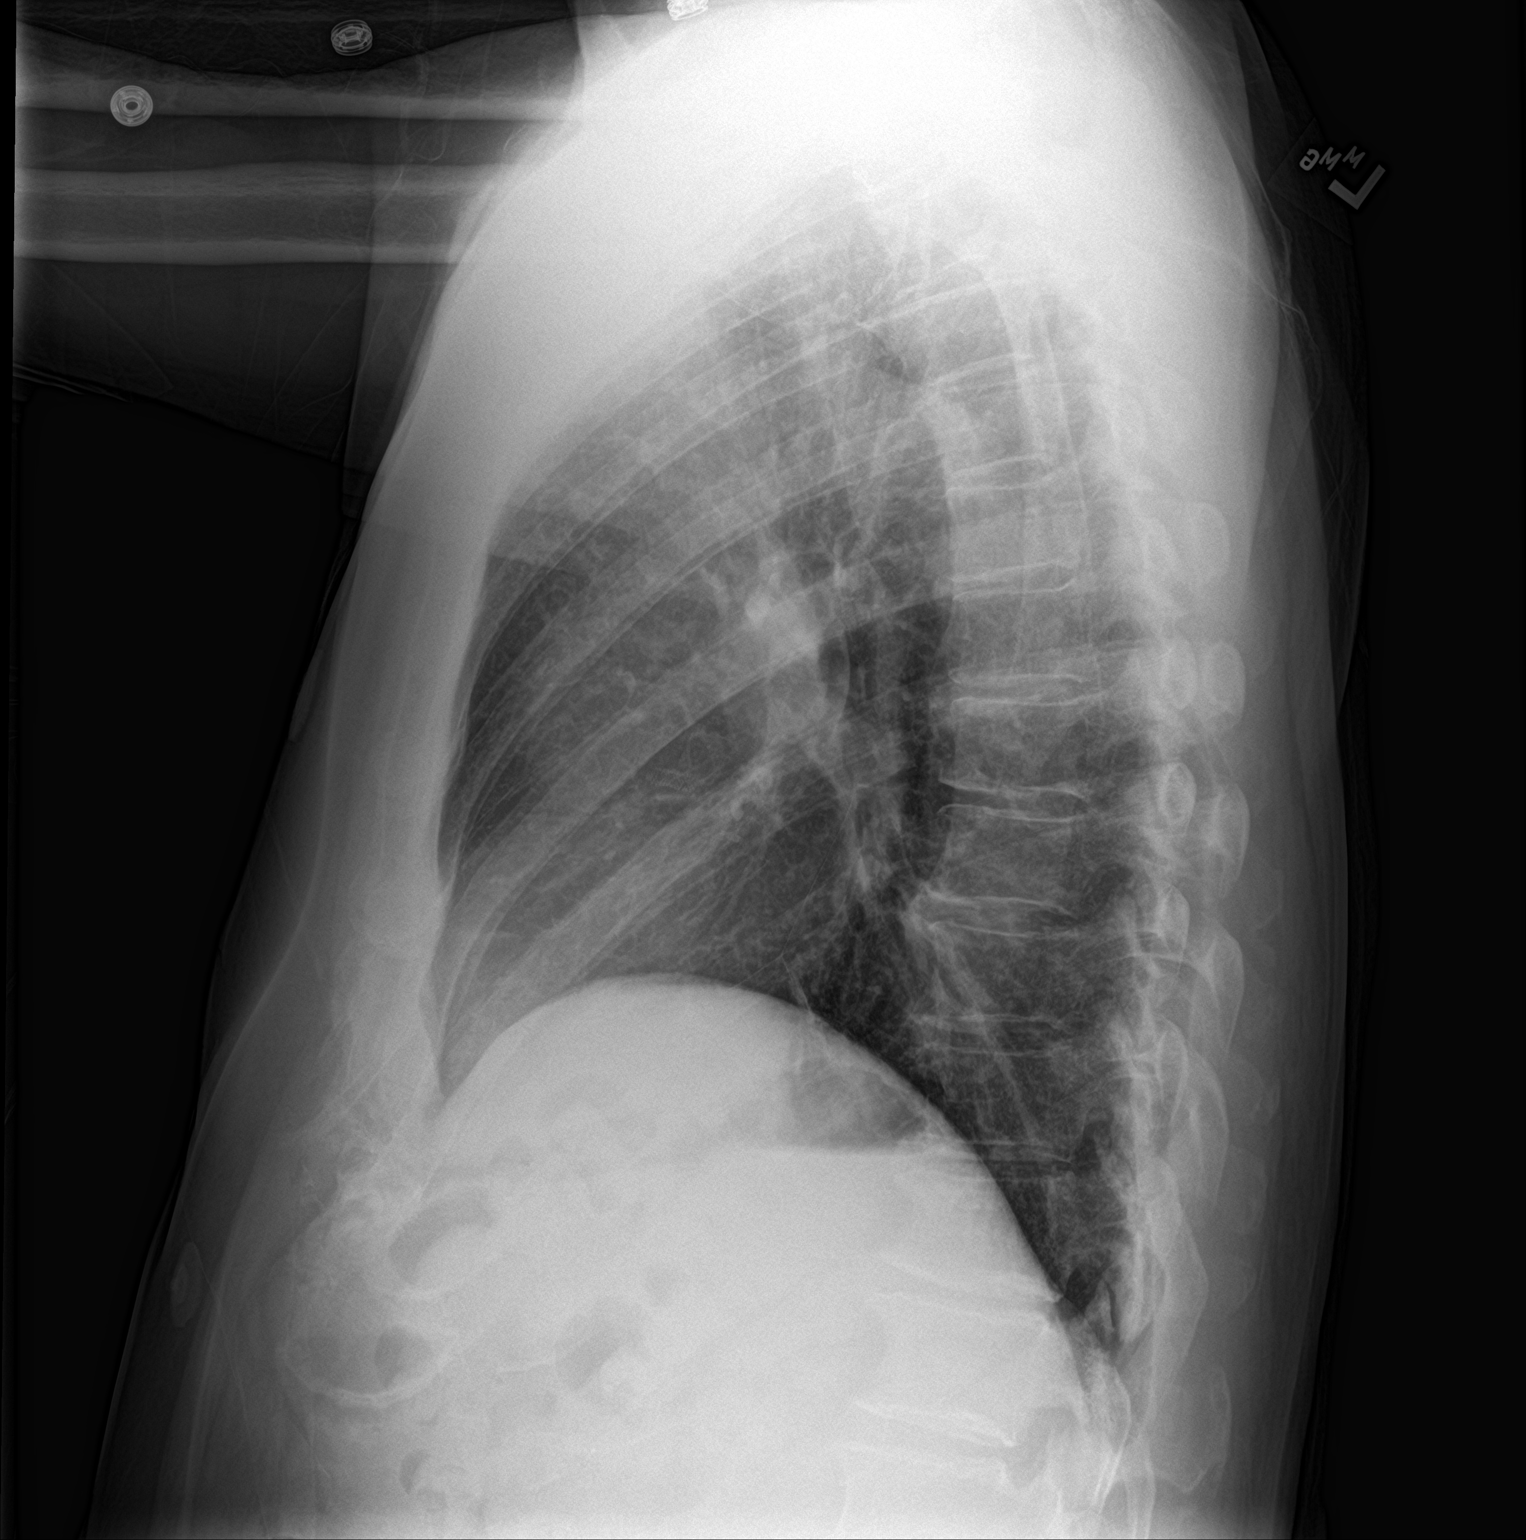

[2 of 2 positions shown; findings below may reference images not displayed]

FINDINGS: The heart size and mediastinal contours are within normal limits.
Both lungs are clear. The visualized skeletal structures are
unremarkable.
IMPRESSION: No active cardiopulmonary disease.

## 2017-10-20 DIAGNOSIS — Z23 Encounter for immunization: Secondary | ICD-10-CM | POA: Diagnosis not present

## 2017-10-21 ENCOUNTER — Telehealth: Payer: Self-pay | Admitting: Allergy and Immunology

## 2017-10-21 ENCOUNTER — Ambulatory Visit (INDEPENDENT_AMBULATORY_CARE_PROVIDER_SITE_OTHER): Payer: Medicare Other | Admitting: *Deleted

## 2017-10-21 DIAGNOSIS — J4550 Severe persistent asthma, uncomplicated: Secondary | ICD-10-CM

## 2017-10-21 MED ORDER — MEPOLIZUMAB 100 MG ~~LOC~~ SOLR
100.0000 mg | SUBCUTANEOUS | Status: DC
  Administered 2017-10-21 – 2019-04-06 (×20): 100 mg via SUBCUTANEOUS

## 2017-10-21 NOTE — Telephone Encounter (Signed)
Pt came by the desk and said that he got a bill for a no show and he said that he came in on 08/17/2017 early . And said that he told them to cancelled the appointment on 08/25/2017. .Marland Kitchen

## 2017-10-21 NOTE — Telephone Encounter (Signed)
Advised pt that we have adjusted the no show fee & he will receive a refund - kt

## 2017-10-21 NOTE — Progress Notes (Signed)
Immunotherapy   Patient Details  Name: Louis Perez MRN: [id number] Date of Birth: 15-Mar-1951  10/21/2017  Louis Perez started injections for  Nucala 100mg  every 28 days. Epi-Pen:Epi-Pen Available  Consent signed and patient instructions given. No problems after 60 minutes in the office.   Vella RedheadHeather Clark 10/21/2017, 6:32 PM

## 2017-11-02 ENCOUNTER — Other Ambulatory Visit: Payer: Self-pay | Admitting: Physician Assistant

## 2017-11-02 NOTE — Telephone Encounter (Signed)
REFILL 

## 2017-11-02 NOTE — Telephone Encounter (Signed)
Please review for refill, Thanks !  

## 2017-11-17 DIAGNOSIS — J4550 Severe persistent asthma, uncomplicated: Secondary | ICD-10-CM | POA: Diagnosis not present

## 2017-11-18 ENCOUNTER — Ambulatory Visit (INDEPENDENT_AMBULATORY_CARE_PROVIDER_SITE_OTHER): Payer: Medicare Other | Admitting: *Deleted

## 2017-11-18 DIAGNOSIS — J4550 Severe persistent asthma, uncomplicated: Secondary | ICD-10-CM | POA: Diagnosis not present

## 2017-12-10 ENCOUNTER — Other Ambulatory Visit: Payer: Self-pay

## 2017-12-10 MED ORDER — LOSARTAN POTASSIUM 50 MG PO TABS: 50.0000 mg | ORAL_TABLET | Freq: Every day | ORAL | 5 refills | Status: DC

## 2017-12-15 DIAGNOSIS — J4550 Severe persistent asthma, uncomplicated: Secondary | ICD-10-CM | POA: Diagnosis not present

## 2017-12-16 ENCOUNTER — Ambulatory Visit (INDEPENDENT_AMBULATORY_CARE_PROVIDER_SITE_OTHER): Payer: Medicare Other | Admitting: *Deleted

## 2017-12-16 DIAGNOSIS — J4550 Severe persistent asthma, uncomplicated: Secondary | ICD-10-CM

## 2018-01-12 DIAGNOSIS — J4550 Severe persistent asthma, uncomplicated: Secondary | ICD-10-CM | POA: Diagnosis not present

## 2018-01-13 ENCOUNTER — Ambulatory Visit (INDEPENDENT_AMBULATORY_CARE_PROVIDER_SITE_OTHER): Payer: Medicare Other | Admitting: *Deleted

## 2018-01-13 DIAGNOSIS — J4550 Severe persistent asthma, uncomplicated: Secondary | ICD-10-CM | POA: Diagnosis not present

## 2018-01-23 ENCOUNTER — Other Ambulatory Visit: Payer: Self-pay | Admitting: Allergy and Immunology

## 2018-01-24 NOTE — Telephone Encounter (Signed)
Courtesy refill  

## 2018-02-09 DIAGNOSIS — J4550 Severe persistent asthma, uncomplicated: Secondary | ICD-10-CM | POA: Diagnosis not present

## 2018-02-10 ENCOUNTER — Ambulatory Visit (INDEPENDENT_AMBULATORY_CARE_PROVIDER_SITE_OTHER): Payer: Medicare Other | Admitting: *Deleted

## 2018-02-10 DIAGNOSIS — J4550 Severe persistent asthma, uncomplicated: Secondary | ICD-10-CM

## 2018-02-11 ENCOUNTER — Telehealth: Payer: Self-pay | Admitting: Internal Medicine

## 2018-02-11 NOTE — Telephone Encounter (Signed)
Sounds fine ... I will see him on Tuesday.  Dr. HRexene Edison

## 2018-02-11 NOTE — Telephone Encounter (Signed)
Pt c/o of Chest Pain: 1. Are you having CP right now?no-pain is off and on -Made appt for pt on Tuesday-please call to evaluate 2. Are you experiencing any other symptoms (ex. SOB, nausea, vomiting, sweating)? no 3. How long have you been experiencing CP? For a while 4. Is your CP continuous or coming and going? Coming and going 5. Have you taken Nitroglycerin? no

## 2018-02-11 NOTE — Telephone Encounter (Signed)
Spoke with pt he states that he has been having intermittent "spurts" of chest pain for 2 weeks he states that it does not last long enough for him to take his nitro. He denies any n&v, dizziness and sweating he states that he does get intermittent DOE but this is not all the time on exertion and not when he is having the chest pain. He states that his BP is fine. He is asking that I forward to Dr Rennis GoldenHilty for his input., he has appt scheduled Tuesday @3pm .

## 2018-02-15 ENCOUNTER — Ambulatory Visit (INDEPENDENT_AMBULATORY_CARE_PROVIDER_SITE_OTHER): Payer: Medicare Other | Admitting: Internal Medicine

## 2018-02-15 ENCOUNTER — Encounter: Payer: Self-pay | Admitting: Internal Medicine

## 2018-02-15 VITALS — BP 146/92 | HR 64 | Ht 75.0 in | Wt 213.4 lb

## 2018-02-15 DIAGNOSIS — R079 Chest pain, unspecified: Secondary | ICD-10-CM

## 2018-02-15 DIAGNOSIS — I10 Essential (primary) hypertension: Secondary | ICD-10-CM

## 2018-02-15 DIAGNOSIS — Z955 Presence of coronary angioplasty implant and graft: Secondary | ICD-10-CM | POA: Diagnosis not present

## 2018-02-15 DIAGNOSIS — E785 Hyperlipidemia, unspecified: Secondary | ICD-10-CM | POA: Diagnosis not present

## 2018-02-15 NOTE — Progress Notes (Signed)
Cardiology Office Note   Date:  02/15/2018   ID:  Louis Perez, DOB 10/21/1951, MRN [id number]  PCP:  Patient, No Pcp Per  Cardiologist:   Chrystie Nose, MD   CC: Routine follow-up, occasional chest pain  History of Present Illness: Louis Perez is a 67 y.o. male who presents for evaluation of chest and back pain. Patient has a history of coronary disease with recent NSTEMI in March with DES stenting of the LAD.  He had non obstructive disease elsewhere.  EF was normal.    He was added to my schedule today for evaluation of chest and arm discomfort. This began on Sunday. It is similar to his previous unstable angina but not as severe. Still he describes the peak of his discomfort at 7 out of 10 in intensity. He gets across his chest. His left arm hurt. At times he has noticed some diaphoresis. It might last for only a minute. He has taken nitroglycerin and over the course of the last few days and it improves the symptoms. He describes it as somewhat heavy. He's not had any palpitations, presyncope or syncope. He's not describing any new shortness of breath, PND or orthopnea. He has brought the discomfort on a few times when he's been doing activities although this is been intermittent. He was actually able to do a little golfing yesterday without symptoms. However, prior to this he had discomfort with golfing building his children sandbox. This morning the pain was at rest. He still describes a little mid chest tightness.  Of note he has not had the symptoms since his stent.  I did review his catheterization. I looked at the images and he had a very high grade proximal LAD in March with nonobstructive disease in the right coronary.  05/28/2016  Louis Perez is a 67 year old patient who I last saw in 2014. He did not see me in follow-up since that time and is seen a number of other providers in our practice, ultimately leading to presentation with an STEMI in March with a  placement of a drug-eluting stent in the proximal LAD. Subsequent to that he had chest pain as described above and was referred back to the hospital for coronary evaluation. Catheterization indicated an 80% ostial ramus lesion at the area of partially jailed vessel due to the proximal LAD stent. It was felt that intervention could compromise the LAD therefore medical therapy was recommended. His isosorbide was doubled to 60 mg daily. Since then he reports he has had some improvement in chest discomfort but has significant headache.  06/15/2016  Louis Perez was seen back today in the office for chest pain. When I last saw him we had increased his isosorbide up to 60 mg daily and he's been on Ranexa thousand milligrams twice a day. He reports he continues to have episodes of chest discomfort which can occur at rest or with exertion. I've advised that he stop cardiac rehabilitation until we can determine whether or not his pain is related to coronary obstruction.  07/23/2016  Louis Perez returns for follow-up today. He underwent repeat cardiac catheterization by Dr. Excell Seltzer. This showed the following:  Conclusion    Prox RCA to Mid RCA lesion, 25% stenosed.  Ost LM lesion, 25% stenosed.  Ost Ramus lesion, 75% stenosed.  Mid LAD to Dist LAD lesion, 50% stenosed.  Ost 2nd Diag to 2nd Diag lesion, 30% stenosed.  1. Continued patency of the proximal LAD stent 2.  Mild diffuse nonobstructive RCA stenosis with diffuse coronary ectasia 3. Patent left circumflex 4. Moderate-severe stenosis of the ramus intermedius with negative pressure wire analysis (FFR = 0.92 baseline and 0.85 at peak hyperemia)  Recommend: resume cardiac rehab, medical therapy   No intervention was performed. Since discharge she notes that he's had no further chest pain. He feels like the change in his isosorbide is been helpful. He is about to finish card agreeable dictation and will continue to exercise at the [facility]. He's lost  about 5-7 pounds and is well within the normal weight range at this time.  01/04/2017  Louis Perez returns today for follow-up. He reports that initially had some improvement with increasing his medical therapy for angina but still continues to have sharp, intermittent chest pains on a daily basis. This is typically in the right sternal area. He would not clearly say that his symptoms are progressively getting worse. Blood pressure is well-controlled today. His LDL cholesterol is 93 on 80 mg Lipitor. I advised him that his goal cholesterol should be much lower and that we may have difficulty reaching that since he is on a high potency statin. Options include adding ezetimibe or perhaps enrolling him in the Orion-10 trial. He did seem to be interested in the study medication. His last lipid profile was earlier this year and he will be due for repeat blood work. He is also had problems with asthma and has seen Dr. Sherene Sires for evaluation of this and to include cyclical shortness of breath.  03/22/2017  Louis Perez returns today for follow-up. He is doing fairly well denies any recurrent chest pain. Unfortunately has an upper respiratory infection and is had a flare of his asthma recently. Cholesterol is much better control with LDL now down to 64 on recent lab work with an LDL particle number less than 1000. ApoB and LPA level both at goal as well. It's now been over one year since his drug-eluting stent. He underwent a relook catheterization which showed a patent stent a couple months after his initial procedure for ongoing chest pain. He is also on Ranexa and isosorbide. He is interested in coming off some of his medicines.  08/02/2017  Louis Perez returns today for follow-up. He reports he occasionally gets some chest discomfort but its quick, sharp and atypical for coronary disease. He's found nothing to be limiting of his activities. He weaned himself off Ranexa by his request. He remains on isosorbide 60 mg  twice a day. He has not required short acting nitroglycerin. Overall he feels fairly well. His energy level is good. He is on atorvastatin 80 mg with an LDL C a less than 70. Blood pressure is at goal.  02/15/2018  Louis Perez returns today for follow-up.  He continues to get occasional sharp chest discomfort.  This is a similar complaint he had in August of last year.  He took himself off Ranexa and noticed no difference in his symptoms.  He is on isosorbide.  He said none of the episodes last long enough to take short acting nitro.  His wife is concerned only that he seems to mention it more frequently.  The episodes are not associated with exertion or relieved by rest.  EKG personally reviewed today shows normal sinus rhythm without ischemic changes at 64.  Past Medical History:  Diagnosis Date  . Arthritis    "hips, lower back, knees, shoulders, hands" (05/14/2016)  . Asthma dx'd in 2014  . Bacterial septicemia (HCC) 01/2011   "  both knee involved"  . CAD (coronary artery disease)    a. NSTEMI 02/2016 s/p DES to LAD. b. Relook cath due to angina/prior high risk anatomy 05/14/16 -> continued medical therapy given concern that a stent strut could inferere with Cx itself.  Marland Kitchen GERD (gastroesophageal reflux disease)   . Gilbert's disease   . Hyperlipidemia   . Hypertension   . 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 NSTEMI (non-ST elevated myocardial infarction) (HCC) 02/2016   [name] 03/02/2016  . Sinus bradycardia   . Unspecified tinnitus    right    Past Surgical History:  Procedure Laterality Date  . BACK SURGERY    . CARDIAC CATHETERIZATION N/A 03/01/2016   Procedure: Left Heart Cath and Coronary Angiography;  Surgeon: Tonny Bollman, MD;  oLAD 95%, CFX/RI/RCA/RPDA 25-30%  . CARDIAC CATHETERIZATION N/A 03/01/2016   Procedure: Coronary Stent Intervention;  Surgeon: Tonny Bollman, MD;  Promus Premier 4.0 x  12 mm DES to oLAD  . CARDIAC CATHETERIZATION N/A 05/14/2016   Procedure: Left Heart  Cath and Coronary Angiography;  Surgeon: Lennette Bihari, MD;  [facility] INVASIVE CV LAB;  Service: Cardiovascular;  Laterality: N/A;  . CARDIAC CATHETERIZATION N/A 06/17/2016   Procedure: Intravascular Pressure Wire/FFR Study;  Surgeon: Tonny Bollman, MD;  Location: [facility] INVASIVE CV LAB;  Service: Cardiovascular;  Laterality: N/A;  . CARDIOVASCULAR STRESS TEST  05/26/2012   EKG negative for ischemia, no ECG changes, no significant ischemia noted  . I&D KNEE WITH POLY EXCHANGE Bilateral 01/2011   & complete synovectomy of 3 compartments of the right total knee/notes 02/04/2011 (replacement,partial knee replacement because of bacterial infection)  . JOINT REPLACEMENT    . KNEE ARTHROSCOPY Right ~ 2002  . LUMBAR DISC SURGERY  ~ 1980; 2000  . NASAL SINUS SURGERY  08/2011  . SHOULDER ARTHROSCOPY W/ ROTATOR CUFF REPAIR Right 01/2015  . TOTAL KNEE ARTHROPLASTY Bilateral 03/26/2004-09/15/2004    right-left  . TRANSTHORACIC ECHOCARDIOGRAM  05/26/2012   EF >55%, mild concentric LVH     Current Outpatient Medications  Medication Sig Dispense Refill  . albuterol (PROAIR HFA) 108 (90 Base) MCG/ACT inhaler Inhale 2 puffs into the lungs every 4 (four) hours as needed for wheezing or shortness of breath. 1 Inhaler 3  . aspirin 81 MG tablet Take 81 mg by mouth at bedtime.     Marland Kitchen atorvastatin (LIPITOR) 80 MG tablet Take 1 tablet (80 mg total) by mouth daily at 6 PM. 30 tablet 10  . fluticasone (FLONASE) 50 MCG/ACT nasal spray Use one spray in each nostril twice daily 16 g 3  . Garlic 1000 MG CAPS Take 1,000 mg by mouth daily.     Marland Kitchen ipratropium-albuterol (DUONEB) 0.5-2.5 (3) MG/3ML SOLN Take 3 mLs by nebulization every 6 (six) hours as needed. 120 mL 3  . isosorbide mononitrate (IMDUR) 60 MG 24 hr tablet Take 60 mg by mouth daily.    Marland Kitchen losartan (COZAAR) 50 MG tablet Take 1 tablet (50 mg total) by mouth daily. 30 tablet 5  . montelukast (SINGULAIR) 10 MG tablet Take 1 tablet (10 mg total) by mouth at bedtime. 30  tablet 5  . Multiple Vitamin (MULTIVITAMIN WITH MINERALS) TABS tablet Take 1 tablet by mouth daily.    . nitroGLYCERIN (NITROSTAT) 0.4 MG SL tablet Place 1 tablet (0.4 mg total) under the tongue every 5 (five) minutes x 3 doses as needed for chest pain. 25 tablet 3  . omeprazole (PRILOSEC) 40 MG capsule TAKE 1 CAPSULE (40  MG TOTAL) BY MOUTH 2 (TWO) TIMES DAILY. 60 capsule 0  . ranitidine (ZANTAC) 300 MG tablet Take 1 tablet (300 mg total) by mouth at bedtime. 30 tablet 5   Current Facility-Administered Medications  Medication Dose Route Frequency Provider Last Rate Last Dose  . Mepolizumab SOLR 100 mg  100 mg Subcutaneous Q28 days Jessica PriestKozlow, Eric J, MD   100 mg at 02/10/18 1646    Allergies:   Oxycontin [oxycodone hcl]; Percocet [oxycodone-acetaminophen]; and Ambien [zolpidem tartrate]    Social History:  The patient  reports that he quit smoking about 49 years ago. His smoking use included cigarettes. He has a 5.00 pack-year smoking history. he has never used smokeless tobacco. He reports that he drinks about 0.6 oz of alcohol per week. He reports that he does not use drugs.   Family History:  The patient's family history includes Alzheimer's disease in his father; Cancer in his paternal aunt, paternal uncle, paternal uncle, and paternal uncle; Dementia in his father; Heart disease in his mother; Heart disease (age of onset: 10760) in his brother.    ROS:   Pertinent items noted in HPI and remainder of comprehensive ROS otherwise negative.  PHYSICAL EXAM: VS:  BP (!) 146/92   Pulse 64   Ht 6\' 3"  (1.905 m)   Wt 213 lb 6.4 oz (96.8 kg)   BMI 26.67 kg/m  , BMI Body mass index is 26.67 kg/m. General appearance: alert and no distress Neck: no carotid bruit, no JVD and thyroid not enlarged, symmetric, no tenderness/mass/nodules Lungs: clear to auscultation bilaterally Heart: regular rate and rhythm and S1, S2 normal Abdomen: soft, non-tender; bowel sounds normal; no masses,  no  organomegaly Extremities: extremities normal, atraumatic, no cyanosis or edema Pulses: 2+ and symmetric Skin: Skin color, texture, turgor normal. No rashes or lesions Neurologic: Grossly normal Psych: Pleasant  EKG:   Sinus rhythm 64-personally reviewed  Recent Labs: 02/22/2017: ALT 143; BUN 16; Creatinine, Ser 0.87; Hemoglobin 16.5; Platelets 201; Potassium 3.9; Sodium 137   Lipid Panel    Component Value Date/Time   CHOL 136 01/08/2017 0833   TRIG 109 01/08/2017 0833   HDL 53 01/08/2017 0833   CHOLHDL 2.6 01/08/2017 0833   CHOLHDL 3.0 03/01/2016 0448   VLDL 17 03/01/2016 0448   LDLCALC 64 01/08/2017 0833     Wt Readings from Last 3 Encounters:  02/15/18 213 lb 6.4 oz (96.8 kg)  08/02/17 207 lb (93.9 kg)  03/23/17 202 lb 3.2 oz (91.7 kg)    ASSESSMENT AND PLAN:  Patient Active Problem List   Diagnosis Date Noted  . Pansinusitis 01/06/2017  . Hyperlipidemia 07/23/2016  . Anginal chest pain at rest [facility](HCC) 06/17/2016  . Coronary artery disease involving native coronary artery of native heart with unstable angina pectoris (HCC) 06/15/2016  . Stented coronary artery 06/15/2016  . Pain in the chest 05/28/2016  . Sinus bradycardia 05/15/2016  . Unstable angina (HCC) 05/14/2016  . CAD (coronary artery disease), native coronary artery 05/14/2016  . Dyslipidemia 09/28/2013  . GERD (gastroesophageal reflux disease) 09/28/2013  . 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 Moderate persistent chronic asthma with acute exacerbation 06/05/2013  . Essential hypertension 06/05/2013   PLAN: Louis Perez reports some continued atypical, sharp short-lived chest pain.  I do not feel that this is angina.  He is not been exercising regularly.  I advised that he actually start low intensity exercise and try to increase it.  Should he have any further chest pain with exercise of course he  should stop.  Any pain is not relieved at rest he should treat with nitroglycerin and should contact us.  I do not think  additional evaluation is necessary at this time.  Plan follow-up in 6 months or sooner as necessary.  Chrystie Nose, MD, [facility], FACP  [place]  [facility]  Medical Director of the [facility]  [facility] Diplomate of the American Board of Clinical Lipidology Attending Cardiologist  Direct Dial: [phone]  Fax: [phone]  Website:  [email or web address]  Chrystie Nose, MD  02/15/2018 3:21 PM

## 2018-02-15 NOTE — Patient Instructions (Signed)
Your physician wants you to follow-up in: 6 months with Dr. [name]. You will receive a reminder letter in the mail two months in advance. If you don't receive a letter, please call our office to schedule the follow-up appointment.    

## 2018-02-24 ENCOUNTER — Other Ambulatory Visit: Payer: Self-pay | Admitting: Allergy and Immunology

## 2018-03-06 ENCOUNTER — Other Ambulatory Visit: Payer: Self-pay | Admitting: Internal Medicine

## 2018-03-08 ENCOUNTER — Other Ambulatory Visit: Payer: Self-pay | Admitting: Internal Medicine

## 2018-03-08 NOTE — Telephone Encounter (Signed)
REFILL 

## 2018-03-09 DIAGNOSIS — J4550 Severe persistent asthma, uncomplicated: Secondary | ICD-10-CM | POA: Diagnosis not present

## 2018-03-10 ENCOUNTER — Telehealth: Payer: Self-pay | Admitting: *Deleted

## 2018-03-10 ENCOUNTER — Ambulatory Visit (INDEPENDENT_AMBULATORY_CARE_PROVIDER_SITE_OTHER): Payer: Medicare Other | Admitting: *Deleted

## 2018-03-10 DIAGNOSIS — J4550 Severe persistent asthma, uncomplicated: Secondary | ICD-10-CM | POA: Diagnosis not present

## 2018-03-10 NOTE — Telephone Encounter (Signed)
Spoke to patient advised as written per Dr Lucie LeatherKozlow. Appointment made for 03/15/18 @ 1030. Patient verbalized understanding.

## 2018-03-10 NOTE — Telephone Encounter (Signed)
Please inform patient that this would be a very unusual side effect for NUCALA.  However, something is going on and please have him make an appointment to see me in clinic.

## 2018-03-10 NOTE — Telephone Encounter (Signed)
Patient is having some mouth sores and wants to know if that could be a side effect of Nucala. Please advise.

## 2018-03-15 ENCOUNTER — Ambulatory Visit (INDEPENDENT_AMBULATORY_CARE_PROVIDER_SITE_OTHER): Payer: Medicare Other | Admitting: Allergy and Immunology

## 2018-03-15 ENCOUNTER — Encounter: Payer: Self-pay | Admitting: Allergy and Immunology

## 2018-03-15 VITALS — BP 128/86 | HR 76 | Resp 16

## 2018-03-15 DIAGNOSIS — K121 Other forms of stomatitis: Secondary | ICD-10-CM | POA: Diagnosis not present

## 2018-03-15 DIAGNOSIS — K1230 Oral mucositis (ulcerative), unspecified: Secondary | ICD-10-CM | POA: Diagnosis not present

## 2018-03-15 DIAGNOSIS — J324 Chronic pansinusitis: Secondary | ICD-10-CM | POA: Diagnosis not present

## 2018-03-15 DIAGNOSIS — J4550 Severe persistent asthma, uncomplicated: Secondary | ICD-10-CM

## 2018-03-15 DIAGNOSIS — G4709 Other insomnia: Secondary | ICD-10-CM

## 2018-03-15 DIAGNOSIS — K219 Gastro-esophageal reflux disease without esophagitis: Secondary | ICD-10-CM | POA: Diagnosis not present

## 2018-03-15 DIAGNOSIS — J3089 Other allergic rhinitis: Secondary | ICD-10-CM | POA: Diagnosis not present

## 2018-03-15 NOTE — Progress Notes (Signed)
Follow-up Note  Referring Provider: Roger Kill, * Primary Provider: Bernita Buffy Date of Office Visit: 03/15/2018  Subjective:   Louis Perez (DOB: 1951-03-06) is a 67 y.o. male who [facility] in re-evaluation of the following:  HPI: Louis Perez presents to this clinic in evaluation of asthma and allergic rhinitis and history of chronic sinusitis and LPR and insomnia and a new issue involving his mouth over the course of the past 2 months.  His last visit to this clinic was 17 August 2017.  His respiratory tract is doing wonderful while he continues to consistently use anti-inflammatory medications for both his upper and lower airway and NUCALA.  Rarely does he have any issues with his nose or chest and he can exercise without much difficulty and does not use a short acting bronchodilator and has not required an antibiotic or systemic steroid to treat any type of respiratory tract issue.  His reflux has been under excellent control and his insomnia has not really been causing him any problem as long as he continues on treatment.  What is new for Louis Perez over the course of the past 2 months is that he has developed "sores and raw spots" on his gums and cheeks and sometimes it hurts to brush his teeth.  He was treated with prednisone about 3 weeks ago for a traumatic fall out of his bedroom window giving rise to contusions of his left leg and left chest.  When he took the prednisone almost all the sores in his mouth completely resolved and now he has very little issue involving his mouth.  There is no obvious provoking factor that has accounted for his sores as he has not started any kind of new toothpaste prior to this issue or had any new medications administered prior to this issue.  He does not have any new skin findings that have developed during this timeframe as well.  Allergies as of 03/15/2018      Reactions   Oxycontin  [oxycodone Hcl] Anaphylaxis   Percocet [oxycodone-acetaminophen] Anaphylaxis   Tolerates Vicodin   Ambien [zolpidem Tartrate] Other (See Comments)   Mood changes       Medication List      albuterol 108 (90 Base) MCG/ACT inhaler Commonly known as:  PROAIR HFA Inhale 2 puffs into the lungs every 4 (four) hours as needed for wheezing or shortness of breath.   aspirin 81 MG tablet Take 81 mg by mouth at bedtime.   atorvastatin 80 MG tablet Commonly known as:  LIPITOR TAKE 1 TABLET (80 MG TOTAL) BY MOUTH DAILY AT 6 PM.   budesonide-formoterol 160-4.5 MCG/ACT inhaler Commonly known as:  SYMBICORT Inhale 2 puffs into the lungs 2 (two) times daily.   fluticasone 50 MCG/ACT nasal spray Commonly known as:  FLONASE Use one spray in each nostril twice daily   Garlic 1000 MG Caps Take 1,000 mg by mouth daily.   ipratropium-albuterol 0.5-2.5 (3) MG/3ML Soln Commonly known as:  DUONEB Take 3 mLs by nebulization every 6 (six) hours as needed.   isosorbide mononitrate 60 MG 24 hr tablet Commonly known as:  IMDUR Take 60 mg by mouth daily.   losartan 50 MG tablet Commonly known as:  COZAAR Take 1 tablet (50 mg total) by mouth daily.   montelukast 10 MG tablet Commonly known as:  SINGULAIR Take 1 tablet (10 mg total) by mouth at bedtime.   multivitamin with minerals  Tabs tablet Take 1 tablet by mouth daily.   nitroGLYCERIN 0.4 MG SL tablet Commonly known as:  NITROSTAT Place 1 tablet (0.4 mg total) under the tongue every 5 (five) minutes x 3 doses as needed for chest pain.   omeprazole 40 MG capsule Commonly known as:  PRILOSEC TAKE 1 CAPSULE BY MOUTH TWICE A DAY   ranitidine 300 MG tablet Commonly known as:  ZANTAC Take 1 tablet (300 mg total) by mouth at bedtime.       Past Medical History:  Diagnosis Date  . Arthritis    "hips, lower back, knees, shoulders, hands" (05/14/2016)  . Asthma dx'd in 2014  . Bacterial septicemia (HCC) 01/2011   "both knee involved"  .  CAD (coronary artery disease)    a. NSTEMI 02/2016 s/p DES to LAD. b. Relook cath due to angina/prior high risk anatomy 05/14/16 -> continued medical therapy given concern that a stent strut could inferere with Cx itself.  Marland Kitchen GERD (gastroesophageal reflux disease)   . Gilbert's disease   . Hyperlipidemia   . Hypertension   . 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 NSTEMI (non-ST elevated myocardial infarction) (HCC) 02/2016   [name] 03/02/2016  . Sinus bradycardia   . Unspecified tinnitus    right    Past Surgical History:  Procedure Laterality Date  . BACK SURGERY    . CARDIAC CATHETERIZATION N/A 03/01/2016   Procedure: Left Heart Cath and Coronary Angiography;  Surgeon: Tonny Bollman, MD;  oLAD 95%, CFX/RI/RCA/RPDA 25-30%  . CARDIAC CATHETERIZATION N/A 03/01/2016   Procedure: Coronary Stent Intervention;  Surgeon: Tonny Bollman, MD;  Promus Premier 4.0 x  12 mm DES to oLAD  . CARDIAC CATHETERIZATION N/A 05/14/2016   Procedure: Left Heart Cath and Coronary Angiography;  Surgeon: Lennette Bihari, MD;  Location: [facility] INVASIVE CV LAB;  Service: Cardiovascular;  Laterality: N/A;  . CARDIAC CATHETERIZATION N/A 06/17/2016   Procedure: Intravascular Pressure Wire/FFR Study;  Surgeon: Tonny Bollman, MD;  Location: [facility] INVASIVE CV LAB;  Service: Cardiovascular;  Laterality: N/A;  . CARDIOVASCULAR STRESS TEST  05/26/2012   EKG negative for ischemia, no ECG changes, no significant ischemia noted  . I&D KNEE WITH POLY EXCHANGE Bilateral 01/2011   & complete synovectomy of 3 compartments of the right total knee/notes 02/04/2011 (replacement,partial knee replacement because of bacterial infection)  . JOINT REPLACEMENT    . KNEE ARTHROSCOPY Right ~ 2002  . LUMBAR DISC SURGERY  ~ 1980; 2000  . NASAL SINUS SURGERY  08/2011  . SHOULDER ARTHROSCOPY W/ ROTATOR CUFF REPAIR Right 01/2015  . TOTAL KNEE ARTHROPLASTY Bilateral 03/26/2004-09/15/2004    right-left  . TRANSTHORACIC ECHOCARDIOGRAM  05/26/2012   EF >55%, mild  concentric LVH    Review of systems negative except as noted in HPI / PMHx or noted below:  Review of Systems  Constitutional: Negative.   HENT: Negative.   Eyes: Negative.   Respiratory: Negative.   Cardiovascular: Negative.   Gastrointestinal: Negative.   Genitourinary: Negative.   Musculoskeletal: Negative.   Skin: Negative.   Neurological: Negative.   Endo/Heme/Allergies: Negative.   Psychiatric/Behavioral: Negative.      Objective:   Vitals:   03/15/18 1023  BP: 128/86  Pulse: 76  Resp: 16          Physical Exam  Constitutional: He is well-developed, well-nourished, and in no distress.  HENT:  Head: Normocephalic.  Right Ear: Tympanic membrane, external ear and ear canal normal.  Left Ear: Tympanic membrane, external ear  and ear canal normal.  Nose: Nose normal. No mucosal edema or rhinorrhea.  Mouth/Throat: Uvula is midline, oropharynx is clear and moist and mucous membranes are normal. No oropharyngeal exudate.  Eyes: Conjunctivae are normal.  Neck: Trachea normal. No tracheal tenderness present. No tracheal deviation present. No thyromegaly present.  Cardiovascular: Normal rate, regular rhythm, S1 normal, S2 normal and normal heart sounds.  No murmur heard. Pulmonary/Chest: Breath sounds normal. No stridor. No respiratory distress. He has no wheezes. He has no rales.  Musculoskeletal: He exhibits no edema.  Lymphadenopathy:       Head (right side): No tonsillar adenopathy present.       Head (left side): No tonsillar adenopathy present.    He has no cervical adenopathy.  Neurological: He is alert. Gait normal.  Skin: No rash noted. He is not diaphoretic. No erythema. Nails show no clubbing.  Psychiatric: Mood and affect normal.    Diagnostics:    Spirometry was performed and demonstrated an FEV1 of 2.52 at 65 % of predicted.  His previous FEV1 was 1.68.  Assessment and Plan:   1. Asthma, severe persistent, well-controlled   2. Other allergic  rhinitis   3. Chronic pansinusitis   4. LPRD (laryngopharyngeal reflux disease)   5. Other insomnia   6. Stomatitis and mucositis     1. Continue to Treat and prevent inflammation:   A. Symbicort 160 - 2 inhalations 2 x day with spacer  B. Flonase - one spray each nostril twice per day  C. montelukast 10 mg - one tablet one time per day  D. Nucala injections  2. Continue to Treat reflux:   A. Remain away from caffeine  B. omeprazole 40 mg twice a day  C. ranitidine 300 mg in evening  3. Continue Periactin 4 mg - one or 2 tablets at bedtime  4. If needed:   A. ProAir HFA 2 puffs or duoneb every 4-6 hours  B. nasal saline  C. OTC antihistamine - Claritin 10 mg daily  5. Further evaluation for mouth problem?  6. 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  I do not know the cause of his mouth problem that apparently resolved when he used prednisone for his musculoskeletal issues.  Having mouth ulcers or what ever this process was always raises the possibility of a immunobullous disease such as pemphigus or pemphigoid and should he have recrudescence of this mouth issue in the future then we will need to have him undergo further evaluation and treatment for these issues.  Laurette SchimkeEric Kozlow, MD Allergy / Immunology [facility]

## 2018-03-15 NOTE — Patient Instructions (Addendum)
  1. Continue to Treat and prevent inflammation:   A. Symbicort 160 - 2 inhalations 2 x day with spacer  B. Flonase - one spray each nostril twice per day  C. montelukast 10 mg - one tablet one time per day  D. Nucala injections  2. Continue to Treat reflux:   A. Remain away from caffeine  B. omeprazole 40 mg twice a day  C. ranitidine 300 mg in evening  3. Continue Periactin 4 mg - one or 2 tablets at bedtime  4. If needed:   A. ProAir HFA 2 puffs or duoneb every 4-6 hours  B. nasal saline  C. OTC antihistamine - Claritin 10 mg daily  5. Further evaluation for mouth problem?  6. Return to clinic in summer 2019 or earlier if problem

## 2018-03-16 ENCOUNTER — Encounter: Payer: Self-pay | Admitting: Allergy and Immunology

## 2018-03-22 ENCOUNTER — Other Ambulatory Visit: Payer: Self-pay | Admitting: Allergy and Immunology

## 2018-04-06 DIAGNOSIS — J4550 Severe persistent asthma, uncomplicated: Secondary | ICD-10-CM | POA: Diagnosis not present

## 2018-04-07 ENCOUNTER — Ambulatory Visit (INDEPENDENT_AMBULATORY_CARE_PROVIDER_SITE_OTHER): Payer: Medicare Other | Admitting: *Deleted

## 2018-04-07 DIAGNOSIS — J4550 Severe persistent asthma, uncomplicated: Secondary | ICD-10-CM

## 2018-05-04 DIAGNOSIS — J4550 Severe persistent asthma, uncomplicated: Secondary | ICD-10-CM | POA: Diagnosis not present

## 2018-05-05 ENCOUNTER — Ambulatory Visit (INDEPENDENT_AMBULATORY_CARE_PROVIDER_SITE_OTHER): Payer: Medicare Other | Admitting: *Deleted

## 2018-05-05 DIAGNOSIS — J4550 Severe persistent asthma, uncomplicated: Secondary | ICD-10-CM | POA: Diagnosis not present

## 2018-05-20 ENCOUNTER — Other Ambulatory Visit: Payer: Self-pay | Admitting: Internal Medicine

## 2018-06-01 DIAGNOSIS — J4550 Severe persistent asthma, uncomplicated: Secondary | ICD-10-CM | POA: Diagnosis not present

## 2018-06-02 ENCOUNTER — Other Ambulatory Visit: Payer: Self-pay | Admitting: Internal Medicine

## 2018-06-02 ENCOUNTER — Ambulatory Visit (INDEPENDENT_AMBULATORY_CARE_PROVIDER_SITE_OTHER): Payer: Medicare Other | Admitting: *Deleted

## 2018-06-02 DIAGNOSIS — J4550 Severe persistent asthma, uncomplicated: Secondary | ICD-10-CM

## 2018-06-29 DIAGNOSIS — J4550 Severe persistent asthma, uncomplicated: Secondary | ICD-10-CM | POA: Diagnosis not present

## 2018-06-30 ENCOUNTER — Ambulatory Visit (INDEPENDENT_AMBULATORY_CARE_PROVIDER_SITE_OTHER): Payer: Medicare Other | Admitting: *Deleted

## 2018-06-30 DIAGNOSIS — J4550 Severe persistent asthma, uncomplicated: Secondary | ICD-10-CM | POA: Diagnosis not present

## 2018-07-11 ENCOUNTER — Other Ambulatory Visit: Payer: Self-pay | Admitting: Family Medicine

## 2018-07-11 DIAGNOSIS — Z87891 Personal history of nicotine dependence: Secondary | ICD-10-CM

## 2018-07-11 DIAGNOSIS — H905 Unspecified sensorineural hearing loss: Secondary | ICD-10-CM | POA: Insufficient documentation

## 2018-07-25 ENCOUNTER — Ambulatory Visit
Admission: RE | Admit: 2018-07-25 | Discharge: 2018-07-25 | Disposition: A | Discharge status: Home / Self Care | Payer: Medicare Other | Source: Ambulatory Visit | Attending: Family Medicine | Admitting: Family Medicine

## 2018-07-25 DIAGNOSIS — Z87891 Personal history of nicotine dependence: Secondary | ICD-10-CM

## 2018-07-26 DIAGNOSIS — I77811 Abdominal aortic ectasia: Secondary | ICD-10-CM | POA: Insufficient documentation

## 2018-07-27 DIAGNOSIS — J4550 Severe persistent asthma, uncomplicated: Secondary | ICD-10-CM | POA: Diagnosis not present

## 2018-07-28 ENCOUNTER — Ambulatory Visit (INDEPENDENT_AMBULATORY_CARE_PROVIDER_SITE_OTHER): Payer: Medicare Other | Admitting: *Deleted

## 2018-07-28 DIAGNOSIS — J4550 Severe persistent asthma, uncomplicated: Secondary | ICD-10-CM

## 2018-08-21 ENCOUNTER — Other Ambulatory Visit: Payer: Self-pay | Admitting: Allergy and Immunology

## 2018-08-23 ENCOUNTER — Other Ambulatory Visit: Payer: Self-pay | Admitting: Allergy and Immunology

## 2018-08-24 DIAGNOSIS — J4550 Severe persistent asthma, uncomplicated: Secondary | ICD-10-CM | POA: Diagnosis not present

## 2018-08-25 ENCOUNTER — Ambulatory Visit (INDEPENDENT_AMBULATORY_CARE_PROVIDER_SITE_OTHER): Payer: Medicare Other | Admitting: *Deleted

## 2018-08-25 DIAGNOSIS — J4550 Severe persistent asthma, uncomplicated: Secondary | ICD-10-CM | POA: Diagnosis not present

## 2018-09-01 ENCOUNTER — Other Ambulatory Visit: Payer: Self-pay | Admitting: Internal Medicine

## 2018-09-01 DIAGNOSIS — H539 Unspecified visual disturbance: Secondary | ICD-10-CM | POA: Insufficient documentation

## 2018-09-05 DIAGNOSIS — H9313 Tinnitus, bilateral: Secondary | ICD-10-CM | POA: Insufficient documentation

## 2018-09-12 ENCOUNTER — Other Ambulatory Visit: Payer: Self-pay | Admitting: Allergy and Immunology

## 2018-09-14 ENCOUNTER — Other Ambulatory Visit: Payer: Self-pay | Admitting: Allergy and Immunology

## 2018-09-21 DIAGNOSIS — J4550 Severe persistent asthma, uncomplicated: Secondary | ICD-10-CM | POA: Diagnosis not present

## 2018-09-22 ENCOUNTER — Ambulatory Visit (INDEPENDENT_AMBULATORY_CARE_PROVIDER_SITE_OTHER): Payer: Medicare Other | Admitting: *Deleted

## 2018-09-22 DIAGNOSIS — J4550 Severe persistent asthma, uncomplicated: Secondary | ICD-10-CM | POA: Diagnosis not present

## 2018-09-26 ENCOUNTER — Ambulatory Visit (INDEPENDENT_AMBULATORY_CARE_PROVIDER_SITE_OTHER): Payer: Medicare Other | Admitting: Internal Medicine

## 2018-09-26 ENCOUNTER — Encounter: Payer: Self-pay | Admitting: Internal Medicine

## 2018-09-26 VITALS — BP 110/78 | HR 76 | Ht 75.0 in | Wt 207.0 lb

## 2018-09-26 DIAGNOSIS — I10 Essential (primary) hypertension: Secondary | ICD-10-CM

## 2018-09-26 DIAGNOSIS — E785 Hyperlipidemia, unspecified: Secondary | ICD-10-CM | POA: Diagnosis not present

## 2018-09-26 DIAGNOSIS — I2510 Atherosclerotic heart disease of native coronary artery without angina pectoris: Secondary | ICD-10-CM | POA: Diagnosis not present

## 2018-09-26 NOTE — Progress Notes (Signed)
Cardiology Office Note   Date:  09/26/2018   ID:  Louis Perez, DOB 1951/09/16, MRN [id number]  PCP:  [facility]  Cardiologist:   Chrystie Nose, MD   CC: No complaints  History of Present Illness: Louis Perez is a 67 y.o. male who presents for evaluation of chest and back pain. Patient has a history of coronary disease with recent NSTEMI in March with DES stenting of the LAD.  He had non obstructive disease elsewhere.  EF was normal.    He was added to my schedule today for evaluation of chest and arm discomfort. This began on Sunday. It is similar to his previous unstable angina but not as severe. Still he describes the peak of his discomfort at 7 out of 10 in intensity. He gets across his chest. His left arm hurt. At times he has noticed some diaphoresis. It might last for only a minute. He has taken nitroglycerin and over the course of the last few days and it improves the symptoms. He describes it as somewhat heavy. He's not had any palpitations, presyncope or syncope. He's not describing any new shortness of breath, PND or orthopnea. He has brought the discomfort on a few times when he's been doing activities although this is been intermittent. He was actually able to do a little golfing yesterday without symptoms. However, prior to this he had discomfort with golfing building his children sandbox. This morning the pain was at rest. He still describes a little mid chest tightness.  Of note he has not had the symptoms since his stent.  I did review his catheterization. I looked at the images and he had a very high grade proximal LAD in March with nonobstructive disease in the right coronary.  05/28/2016  Louis Perez is a 67 year old patient who I last saw in 2014. He did not see me in follow-up since that time and is seen a number of other providers in our practice, ultimately leading to presentation with an STEMI in March with a  placement of a drug-eluting stent in the proximal LAD. Subsequent to that he had chest pain as described above and was referred back to the hospital for coronary evaluation. Catheterization indicated an 80% ostial ramus lesion at the area of partially jailed vessel due to the proximal LAD stent. It was felt that intervention could compromise the LAD therefore medical therapy was recommended. His isosorbide was doubled to 60 mg daily. Since then he reports he has had some improvement in chest discomfort but has significant headache.  06/15/2016  Louis Perez was seen back today in the office for chest pain. When I last saw him we had increased his isosorbide up to 60 mg daily and he's been on Ranexa thousand milligrams twice a day. He reports he continues to have episodes of chest discomfort which can occur at rest or with exertion. I've advised that he stop cardiac rehabilitation until we can determine whether or not his pain is related to coronary obstruction.  07/23/2016  Louis Perez returns for follow-up today. He underwent repeat cardiac catheterization by Dr. Excell Seltzer. This showed the following:  Conclusion    Prox RCA to Mid RCA lesion, 25% stenosed.  Ost LM lesion, 25% stenosed.  Ost Ramus lesion, 75% stenosed.  Mid LAD to Dist LAD lesion, 50% stenosed.  Ost 2nd Diag to 2nd Diag lesion, 30% stenosed.  1. Continued patency of the proximal LAD stent 2. Mild diffuse  nonobstructive RCA stenosis with diffuse coronary ectasia 3. Patent left circumflex 4. Moderate-severe stenosis of the ramus intermedius with negative pressure wire analysis (FFR = 0.92 baseline and 0.85 at peak hyperemia)  Recommend: resume cardiac rehab, medical therapy   No intervention was performed. Since discharge she notes that he's had no further chest pain. He feels like the change in his isosorbide is been helpful. He is about to finish card agreeable dictation and will continue to exercise at the [facility]. He's lost  about 5-7 pounds and is well within the normal weight range at this time.  01/04/2017  Louis Perez returns today for follow-up. He reports that initially had some improvement with increasing his medical therapy for angina but still continues to have sharp, intermittent chest pains on a daily basis. This is typically in the right sternal area. He would not clearly say that his symptoms are progressively getting worse. Blood pressure is well-controlled today. His LDL cholesterol is 93 on 80 mg Lipitor. I advised him that his goal cholesterol should be much lower and that we may have difficulty reaching that since he is on a high potency statin. Options include adding ezetimibe or perhaps enrolling him in the Orion-10 trial. He did seem to be interested in the study medication. His last lipid profile was earlier this year and he will be due for repeat blood work. He is also had problems with asthma and has seen Dr. Sherene Sires for evaluation of this and to include cyclical shortness of breath.  03/22/2017  Louis Perez returns today for follow-up. He is doing fairly well denies any recurrent chest pain. Unfortunately has an upper respiratory infection and is had a flare of his asthma recently. Cholesterol is much better control with LDL now down to 64 on recent lab work with an LDL particle number less than 1000. ApoB and LPA level both at goal as well. It's now been over one year since his drug-eluting stent. He underwent a relook catheterization which showed a patent stent a couple months after his initial procedure for ongoing chest pain. He is also on Ranexa and isosorbide. He is interested in coming off some of his medicines.  08/02/2017  Louis Perez returns today for follow-up. He reports he occasionally gets some chest discomfort but its quick, sharp and atypical for coronary disease. He's found nothing to be limiting of his activities. He weaned himself off Ranexa by his request. He remains on isosorbide 60 mg  twice a day. He has not required short acting nitroglycerin. Overall he feels fairly well. His energy level is good. He is on atorvastatin 80 mg with an LDL C a less than 70. Blood pressure is at goal.  02/15/2018  Louis Perez returns today for follow-up.  He continues to get occasional sharp chest discomfort.  This is a similar complaint he had in August of last year.  He took himself off Ranexa and noticed no difference in his symptoms.  He is on isosorbide.  He said none of the episodes last long enough to take short acting nitro.  His wife is concerned only that he seems to mention it more frequently.  The episodes are not associated with exertion or relieved by rest.  EKG personally reviewed today shows normal sinus rhythm without ischemic changes at 64.  09/26/2018  Louis Perez is seen today in follow-up.  Over the past year he is done very well.  He is now walking up to about 20 miles a week.  He  denies any sharp chest pains or worsening shortness of breath.  He is off of his antianginal medications.  He had lipid testing this summer and LDL was 81.  He reports a pretty healthy diet however does have some intake of saturated fats that could be improved.  Past Medical History:  Diagnosis Date  . Arthritis    "hips, lower back, knees, shoulders, hands" (05/14/2016)  . Asthma dx'd in 2014  . Bacterial septicemia (HCC) 01/2011   "both knee involved"  . CAD (coronary artery disease)    a. NSTEMI 02/2016 s/p DES to LAD. b. Relook cath due to angina/prior high risk anatomy 05/14/16 -> continued medical therapy given concern that a stent strut could inferere with Cx itself.  Marland Kitchen GERD (gastroesophageal reflux disease)   . Gilbert's disease   . Hyperlipidemia   . Hypertension   . 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 NSTEMI (non-ST elevated myocardial infarction) (HCC) 02/2016   [name] 03/02/2016  . Sinus bradycardia   . Unspecified tinnitus    right    Past Surgical History:  Procedure  Laterality Date  . BACK SURGERY    . CARDIAC CATHETERIZATION N/A 03/01/2016   Procedure: Left Heart Cath and Coronary Angiography;  Surgeon: Tonny Bollman, MD;  oLAD 95%, CFX/RI/RCA/RPDA 25-30%  . CARDIAC CATHETERIZATION N/A 03/01/2016   Procedure: Coronary Stent Intervention;  Surgeon: Tonny Bollman, MD;  Promus Premier 4.0 x  12 mm DES to oLAD  . CARDIAC CATHETERIZATION N/A 05/14/2016   Procedure: Left Heart Cath and Coronary Angiography;  Surgeon: Lennette Bihari, MD;  Location[facility] INVASIVE CV LAB;  Service: Cardiovascular;  Laterality: N/A;  . CARDIAC CATHETERIZATION N/A 06/17/2016   Procedure: Intravascular Pressure Wire/FFR Study;  Surgeon: Tonny Bollman, MD;  [facility] INVAS[facility] CV LAB;  Service: Cardiovascular;  Laterality: N/A;  . CARDIOVASCULAR STRESS TEST  05/26/2012   EKG negative for ischemia, no ECG changes, no significant ischemia noted  . I&D KNEE WITH POLY EXCHANGE Bilateral 01/2011   & complete synovectomy of 3 compartments of the right total knee/notes 02/04/2011 (replacement,partial knee replacement because of bacterial infection)  . JOINT REPLACEMENT    . KNEE ARTHROSCOPY Right ~ 2002  . LUMBAR DISC SURGERY  ~ 1980; 2000  . NASAL SINUS SURGERY  08/2011  . SHOULDER ARTHROSCOPY W/ ROTATOR CUFF REPAIR Right 01/2015  . TOTAL KNEE ARTHROPLASTY Bilateral 03/26/2004-09/15/2004    right-left  . TRANSTHORACIC ECHOCARDIOGRAM  05/26/2012   EF >55%, mild concentric LVH     Current Outpatient Medications  Medication Sig Dispense Refill  . albuterol (PROAIR HFA) 108 (90 Base) MCG/ACT inhaler Inhale 2 puffs into the lungs every 4 (four) hours as needed for wheezing or shortness of breath. 1 Inhaler 3  . aspirin 81 MG tablet Take 81 mg by mouth at bedtime.     Marland Kitchen atorvastatin (LIPITOR) 80 MG tablet TAKE 1 TABLET (80 MG TOTAL) BY MOUTH DAILY AT 6 PM. 90 tablet 1  . budesonide-formoterol (SYMBICORT) 160-4.5 MCG/ACT inhaler Inhale 2 puffs into the lungs 2 (two) times daily.    . cetirizine  (ZYRTEC) 10 MG tablet Take 10 mg by mouth daily.    . fluticasone (FLONASE) 50 MCG/ACT nasal spray Use one spray in each nostril twice daily 16 g 3  . Garlic 1000 MG CAPS Take 1,000 mg by mouth daily.     Marland Kitchen ipratropium-albuterol (DUONEB) 0.5-2.5 (3) MG/3ML SOLN Take 3 mLs by nebulization every 6 (six) hours as needed. 120 mL 3  .  isosorbide mononitrate (IMDUR) 60 MG 24 hr tablet Take 60 mg by mouth daily.    Marland Kitchen losartan (COZAAR) 50 MG tablet TAKE 1 TABLET BY MOUTH EVERY DAY 90 tablet 1  . montelukast (SINGULAIR) 10 MG tablet Take 1 tablet (10 mg total) by mouth at bedtime. 30 tablet 5  . Multiple Vitamin (MULTIVITAMIN WITH MINERALS) TABS tablet Take 1 tablet by mouth daily.    . naproxen sodium (ALEVE) 220 MG tablet Take 220 mg by mouth daily as needed (PAIN).    Marland Kitchen nitroGLYCERIN (NITROSTAT) 0.4 MG SL tablet Place 1 tablet (0.4 mg total) under the tongue every 5 (five) minutes x 3 doses as needed for chest pain. 25 tablet 3  . nortriptyline (PAMELOR) 10 MG capsule Take 10 mg by mouth 2 (two) times daily.    Marland Kitchen omeprazole (PRILOSEC) 40 MG capsule TAKE 1 CAPSULE BY MOUTH TWICE A DAY 180 capsule 0  . ranitidine (ZANTAC) 300 MG tablet Take 1 tablet (300 mg total) by mouth at bedtime. 30 tablet 5   Current Facility-Administered Medications  Medication Dose Route Frequency Provider Last Rate Last Dose  . Mepolizumab SOLR 100 mg  100 mg Subcutaneous Q28 days Jessica Priest, MD   100 mg at 09/22/18 1414    Allergies:   Oxycontin [oxycodone hcl]; Percocet [oxycodone-acetaminophen]; and Ambien [zolpidem tartrate]    Social History:  The patient  reports that he quit smoking about 49 years ago. His smoking use included cigarettes. He has a 5.00 pack-year smoking history. He has never used smokeless tobacco. He reports that he drinks about 1.0 standard drinks of alcohol per week. He reports that he does not use drugs.   Family History:  The patient's family history includes Alzheimer's disease in his  father; Cancer in his paternal aunt, paternal uncle, paternal uncle, and paternal uncle; Dementia in his father; Heart disease in his mother; Heart disease (age of onset: 63) in his brother.    ROS:   Pertinent items noted in HPI and remainder of comprehensive ROS otherwise negative.  PHYSICAL EXAM: VS:  There were no vitals taken for this visit. , BMI There is no height or weight on file to calculate BMI. General appearance: alert and no distress Neck: no carotid bruit, no JVD and thyroid not enlarged, symmetric, no tenderness/mass/nodules Lungs: clear to auscultation bilaterally Heart: regular rate and rhythm and S1, S2 normal Abdomen: soft, non-tender; bowel sounds normal; no masses,  no organomegaly Extremities: extremities normal, atraumatic, no cyanosis or edema Pulses: 2+ and symmetric Skin: Skin color, texture, turgor normal. No rashes or lesions Neurologic: Grossly normal Psych: Pleasant  EKG:   Normal sinus rhythm 76-personally reviewed  Recent Labs: No results found for requested labs within last 8760 hours.   Lipid Panel    Component Value Date/Time   CHOL 136 01/08/2017 0833   TRIG 109 01/08/2017 0833   HDL 53 01/08/2017 0833   CHOLHDL 2.6 01/08/2017 0833   CHOLHDL 3.0 03/01/2016 0448   VLDL 17 03/01/2016 0448   LDLCALC 64 01/08/2017 0833     Wt Readings from Last 3 Encounters:  02/15/18 213 lb 6.4 oz (96.8 kg)  08/02/17 207 lb (93.9 kg)  03/23/17 202 lb 3.2 oz (91.7 kg)    ASSESSMENT AND PLAN:  Patient Active Problem List   Diagnosis Date Noted  . Pansinusitis 01/06/2017  . Hyperlipidemia 07/23/2016  . Anginal chest pain at rest [facility]) 06/17/2016  . Coronary artery disease involving native coronary artery of native heart with  unstable angina pectoris (HCC) 06/15/2016  . Stented coronary artery 06/15/2016  . Pain in the chest 05/28/2016  . Sinus bradycardia 05/15/2016  . Unstable angina (HCC) 05/14/2016  . CAD (coronary artery disease), native  coronary artery 05/14/2016  . Dyslipidemia 09/28/2013  . GERD (gastroesophageal reflux disease) 09/28/2013  . 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 Moderate persistent chronic asthma with acute exacerbation 06/05/2013  . Essential hypertension 06/05/2013   PLAN: Louis Perez continues to do very well.  He has had no further sharp chest pain or discomfort concerning for angina.  He is exercising up to 20 miles a week and plays golf regularly.  He remains asymptomatic.  His blood pressure is well controlled.  Cholesterol is still higher than goal LDL less than 70 as of this past July.  We will repeat a lipid profile and encouraged stricter diet.  He may need the addition of ezetimibe to reach target LDL less than 70.  Plan follow-up annually or sooner as necessary.  Chrystie Nose, MD, [facility], FACP  [facility]  Medical Director of the Advanced Lipid Disorders &  Cardiovascular Risk Reduction Clinic Diplomate of the American Board of Clinical Lipidology Attending Cardiologist  Direct Dial: [phone]  Fax: [phone]  Website:  [email or web address]  Chrystie Nose, MD  09/26/2018 9:09 AM

## 2018-09-26 NOTE — Patient Instructions (Signed)
Medication Instructions:  Continue current medications If you need a refill on your cardiac medications before your next appointment, please call your pharmacy.   Lab work: FASTING lab work to recheck cholesterol  If you have labs (blood work) drawn today and your tests are completely normal, you will receive your results only by: Marland Kitchen MyChart Message (if you have MyChart) OR . A paper copy in the mail If you have any lab test that is abnormal or we need to change your treatment, we will call you to review the results.  Testing/Procedures: NONE  Follow-Up: At [facility], you and your health needs are our priority.  As part of our continuing mission to provide you with exceptional heart care, we have created designated Provider Care Teams.  These Care Teams include your primary Cardiologist (physician) and Advanced Practice Providers (APPs -  Physician Assistants and Nurse Practitioners) who all work together to provide you with the care you need, when you need it. You will need a follow up appointment in 12 months.  Please call our office 2 months in advance to schedule this appointment.  You may see Dr. Rennis Golden or one of the following Advanced Practice Providers on your designated Care Team: Azalee Course, [place] . Micah Flesher, PA-C  Any Other Special Instructions Will Be Listed Below (If Applicable).

## 2018-10-14 ENCOUNTER — Other Ambulatory Visit: Payer: Self-pay | Admitting: *Deleted

## 2018-10-14 ENCOUNTER — Other Ambulatory Visit: Payer: Self-pay | Admitting: Allergy and Immunology

## 2018-10-14 MED ORDER — MONTELUKAST SODIUM 10 MG PO TABS: 10.0000 mg | ORAL_TABLET | Freq: Every day | ORAL | 2 refills | Status: DC

## 2018-10-14 MED ORDER — RANITIDINE HCL 300 MG PO TABS: 300.0000 mg | ORAL_TABLET | Freq: Every day | ORAL | 2 refills | Status: DC

## 2018-10-19 DIAGNOSIS — J4550 Severe persistent asthma, uncomplicated: Secondary | ICD-10-CM | POA: Diagnosis not present

## 2018-10-20 ENCOUNTER — Ambulatory Visit (INDEPENDENT_AMBULATORY_CARE_PROVIDER_SITE_OTHER): Payer: Medicare Other | Admitting: *Deleted

## 2018-10-20 DIAGNOSIS — J4550 Severe persistent asthma, uncomplicated: Secondary | ICD-10-CM | POA: Diagnosis not present

## 2018-10-28 ENCOUNTER — Other Ambulatory Visit: Payer: Self-pay | Admitting: Allergy and Immunology

## 2018-10-28 ENCOUNTER — Telehealth: Payer: Self-pay

## 2018-10-28 MED ORDER — NYSTATIN 100000 UNIT/ML MT SUSP: 5.0000 mL | Freq: Four times a day (QID) | OROMUCOSAL | 0 refills | Status: DC

## 2018-10-28 MED ORDER — RANITIDINE HCL 300 MG PO TABS: 300.0000 mg | ORAL_TABLET | Freq: Every day | ORAL | 0 refills | Status: DC

## 2018-10-28 MED ORDER — MONTELUKAST SODIUM 10 MG PO TABS: 10.0000 mg | ORAL_TABLET | Freq: Every day | ORAL | 0 refills | Status: DC

## 2018-10-28 NOTE — Addendum Note (Signed)
Addended by: Shona SimpsonWESTMORELAND, Mathews Stuhr on: 10/28/2018 01:36 PM   Modules accepted: Orders

## 2018-10-28 NOTE — Telephone Encounter (Signed)
Patient is aware that this is a courtesy refill.

## 2018-10-28 NOTE — Telephone Encounter (Signed)
Please send in nystatin suspension 5 mL 3-4 times daily swish and spit.  This should clear up the thrush after 1 week.  Malachi BondsJoel Americo Vallery, MD [facility] 

## 2018-10-28 NOTE — Telephone Encounter (Signed)
Pt called and made appointment for next Friday with anne but he has thrust from his inhaler, can you call in something  For it [phone].

## 2018-10-28 NOTE — Telephone Encounter (Signed)
Medication ready to be sent.  Have left message for patient to call back in order to confirm pharmacy.

## 2018-10-28 NOTE — Telephone Encounter (Signed)
Please advise 

## 2018-11-02 LAB — LIPID PANEL
CHOLESTEROL TOTAL: 152 mg/dL (ref 100–199)
Chol/HDL Ratio: 2.9 ratio (ref 0.0–5.0)
HDL: 53 mg/dL (ref >39)
LDL CALC: 73 mg/dL (ref 0–99)
TRIGLYCERIDES: 128 mg/dL (ref 0–149)
VLDL CHOLESTEROL CAL: 26 mg/dL (ref 5–40)

## 2018-11-03 ENCOUNTER — Encounter: Payer: Self-pay | Admitting: Internal Medicine

## 2018-11-04 ENCOUNTER — Ambulatory Visit: Payer: Medicare Other | Admitting: Family Medicine

## 2018-11-09 ENCOUNTER — Ambulatory Visit (INDEPENDENT_AMBULATORY_CARE_PROVIDER_SITE_OTHER): Payer: Medicare Other | Admitting: Allergy

## 2018-11-09 ENCOUNTER — Encounter: Payer: Self-pay | Admitting: Allergy

## 2018-11-09 VITALS — BP 118/82 | HR 68 | Resp 16 | Ht 74.0 in | Wt 212.8 lb

## 2018-11-09 DIAGNOSIS — J4550 Severe persistent asthma, uncomplicated: Secondary | ICD-10-CM | POA: Diagnosis not present

## 2018-11-09 DIAGNOSIS — K219 Gastro-esophageal reflux disease without esophagitis: Secondary | ICD-10-CM | POA: Diagnosis not present

## 2018-11-09 DIAGNOSIS — J3089 Other allergic rhinitis: Secondary | ICD-10-CM

## 2018-11-09 DIAGNOSIS — K1230 Oral mucositis (ulcerative), unspecified: Secondary | ICD-10-CM

## 2018-11-09 DIAGNOSIS — K121 Other forms of stomatitis: Secondary | ICD-10-CM | POA: Diagnosis not present

## 2018-11-09 MED ORDER — FAMOTIDINE 40 MG PO TABS: 40.0000 mg | ORAL_TABLET | Freq: Every day | ORAL | 5 refills | Status: DC

## 2018-11-09 MED ORDER — NYSTATIN 100000 UNIT/ML MT SUSP: 5.0000 mL | Freq: Four times a day (QID) | OROMUCOSAL | 0 refills | Status: DC

## 2018-11-09 MED ORDER — MOMETASONE FURO-FORMOTEROL FUM 200-5 MCG/ACT IN AERO: 2.0000 | INHALATION_SPRAY | Freq: Two times a day (BID) | RESPIRATORY_TRACT | 5 refills | Status: DC

## 2018-11-09 NOTE — Progress Notes (Signed)
Follow Up Note  RE: Louis MornLawrence E Perez MRN: [id number] DOB: 11-21-1951 Date of Office Visit: 11/09/2018  Referring provider: [place], [place]* Primary care provider: [place], [facility]  Chief Complaint: Asthma and Thrush (cheeks and gums)  History of Present Illness: I had the pleasure of seeing Louis AshLawrence Perez for a follow up visit at the [facility] on 11/09/2018. He is a 67 y.o. male, who is being followed for asthma, allergic rhinitis, LRPD. Today he is here for regular follow up visit but having issues with oral thrush. His previous allergy office visit was on 03/15/2018 with Dr. Lucie LeatherKozlow.   Asthma/mouth ulcers: Currently on Symbicort 160 2 puffs twice a day with spacer and rinsing mouth afterwards but still gets thrush/soreness from it. Describes it as painful and red. He took nystatin which helped tremendously but still having some mucosal soreness. No previous oral antifungal treatment such as fluconazole. Patient used to be on Dulera in the past which did not cause this problem. However he noted improvement in his symptoms whenever he is on oral steroids for this.   Still taking Singulair 10mg  daily and Nucala injections with good benefit. He has some wheezing at times. Otherwise denies any SOB, coughing, chest tightness, nocturnal awakenings, ER/urgent care visits or prednisone use since the last visit.  Reflux: Taking omeprazole 40mg  BID. Stopped zantac due to recall a few weeks ago. Noticed some worsening symptoms when bending over.   Allergic rhinitis: Well controlled.   Assessment and Plan: Louis BishopLawrence is a 67 y.o. male with: Asthma, severe persistent, well-controlled Well-controlled with below regimen however having issues with ? Thrush versus stomatitis.   Today's spirometry showed some mild restriction.  Will switch inhaler to Dulera 200 2 puffs BID as patient did not have this oral mucosa issue with it. Use with spacer  and rinse mouth afterwards. Stop Symbicort.  May use albuterol rescue inhaler 2 puffs or nebulizer every 4 to 6 hours as needed for shortness of breath, chest tightness, coughing, and wheezing. May use albuterol rescue inhaler 2 puffs 5 to 15 minutes prior to strenuous physical activities.  Continue Nucala injections.  Continue Singulair 10mg  daily.   Stomatitis and mucositis Irritation of the oral mucosa. Questionable thrush versus stomatitis but seems to get better with oral prednisone questioning some type of inflammatory process. He did have some relief with oral nystatin.  Do another course of oral nystatin and switched his inhalers around. If this is persistent then may need biopsy for further evaluation.   LPRD (laryngopharyngeal reflux disease) Stopped zantac due to recall and noticed some increased symptoms when he is bending over.  Continue omeprazole 40mg  BID  Start pepcid 40mg  daily.  Other allergic rhinitis Past history - 2018 skin testing was positive to mold Interim history - Well controlled.  May use over the counter antihistamines such as Zyrtec (cetirizine), Claritin (loratadine), Allegra (fexofenadine), or Xyzal (levocetirizine) daily as needed.  Return in about 6 months (around 05/10/2019).  Meds ordered this encounter  Medications  . famotidine (PEPCID) 40 MG tablet    Sig: Take 1 tablet (40 mg total) by mouth daily.    Dispense:  30 tablet    Refill:  5  . mometasone-formoterol (DULERA) 200-5 MCG/ACT AERO    Sig: Inhale 2 puffs into the lungs 2 (two) times daily.    Dispense:  1 Inhaler    Refill:  5  . nystatin (MYCOSTATIN) 100000 UNIT/ML suspension    Sig: Take 5 mLs (  500,000 Units total) by mouth 4 (four) times daily. 3-4 times daily swish and spit.    Dispense:  60 mL    Refill:  0   Lab Orders  No laboratory test(s) ordered today    Diagnostics: Spirometry:  Tracings reviewed. His effort: Good reproducible efforts. FVC: 3.17L FEV1: 2.48L,  64% predicted FEV1/FVC ratio: 78% Interpretation: Spirometry consistent with possible restrictive disease.  Please see scanned spirometry results for details.  Medication List:  Current Outpatient Medications  Medication Sig Dispense Refill  . albuterol (PROAIR HFA) 108 (90 Base) MCG/ACT inhaler Inhale 2 puffs into the lungs every 4 (four) hours as needed for wheezing or shortness of breath. 1 Inhaler 3  . aspirin 81 MG tablet Take 81 mg by mouth at bedtime.     Marland Kitchen atorvastatin (LIPITOR) 80 MG tablet TAKE 1 TABLET (80 MG TOTAL) BY MOUTH DAILY AT 6 PM. 90 tablet 1  . cetirizine (ZYRTEC) 10 MG tablet Take 10 mg by mouth daily.    . fluticasone (FLONASE) 50 MCG/ACT nasal spray Use one spray in each nostril twice daily 16 g 3  . ipratropium-albuterol (DUONEB) 0.5-2.5 (3) MG/3ML SOLN Take 3 mLs by nebulization every 6 (six) hours as needed. 120 mL 3  . isosorbide mononitrate (IMDUR) 60 MG 24 hr tablet Take 60 mg by mouth daily.    Marland Kitchen lidocaine (XYLOCAINE) 2 % solution RINSE ORAL CAVITY AS NEEDED AND SPIT OUT  0  . losartan (COZAAR) 50 MG tablet TAKE 1 TABLET BY MOUTH EVERY DAY 90 tablet 1  . montelukast (SINGULAIR) 10 MG tablet Take 1 tablet (10 mg total) by mouth at bedtime. 30 tablet 0  . Multiple Vitamin (MULTIVITAMIN WITH MINERALS) TABS tablet Take 1 tablet by mouth daily.    . naproxen sodium (ALEVE) 220 MG tablet Take 220 mg by mouth daily as needed (PAIN).    Marland Kitchen nitroGLYCERIN (NITROSTAT) 0.4 MG SL tablet Place 1 tablet (0.4 mg total) under the tongue every 5 (five) minutes x 3 doses as needed for chest pain. 25 tablet 3  . nortriptyline (PAMELOR) 10 MG capsule Take 10 mg by mouth 2 (two) times daily.    Marland Kitchen nystatin (MYCOSTATIN) 100000 UNIT/ML suspension Take 5 mLs (500,000 Units total) by mouth 4 (four) times daily. 3-4 times daily swish and spit. 60 mL 0  . omeprazole (PRILOSEC) 40 MG capsule TAKE 1 CAPSULE BY MOUTH TWICE A DAY 180 capsule 0  . predniSONE (DELTASONE) 20 MG tablet TAKE 1 TABLET  BY MOUTH THREE TIMES DAILY FOR 3 DAYS THEN 1 TABLET TWICE DAILY FOR 3 DAYS THEN 1 TABLET ONCE DAILY FOR 3 DAYS THEN OFF  0  . famotidine (PEPCID) 40 MG tablet Take 1 tablet (40 mg total) by mouth daily. 30 tablet 5  . Garlic 1000 MG CAPS Take 1,000 mg by mouth daily.     . meclizine (ANTIVERT) 25 MG tablet TAKE 1/2 TAB TWICE A DAY FOR A WEEK IF NEEDED CAN INCREASE TO 1 TAB TWICE A DAY  2  . mometasone-formoterol (DULERA) 200-5 MCG/ACT AERO Inhale 2 puffs into the lungs 2 (two) times daily. 1 Inhaler 5  . ranitidine (ZANTAC) 300 MG tablet Take 1 tablet (300 mg total) by mouth at bedtime. (Patient not taking: Reported on 11/09/2018) 30 tablet 0   Current Facility-Administered Medications  Medication Dose Route Frequency Provider Last Rate Last Dose  . Mepolizumab SOLR 100 mg  100 mg Subcutaneous Q28 days Kozlow, Alvira Philips, MD   100 mg at  10/20/18 1729   Allergies: Allergies  Allergen Reactions  . Oxycontin [Oxycodone Hcl] Anaphylaxis  . Percocet [Oxycodone-Acetaminophen] Anaphylaxis    Tolerates Vicodin  . Ambien [Zolpidem Tartrate] Other (See Comments)    Mood changes    I reviewed his past medical history, social history, family history, and environmental history and no significant changes have been reported from previous visit on 03/15/2018.  Review of Systems  Constitutional: Negative for appetite change, chills, fever and unexpected weight change.  HENT: Positive for mouth sores. Negative for congestion and rhinorrhea.   Eyes: Negative for itching.  Respiratory: Positive for wheezing. Negative for cough, chest tightness and shortness of breath.   Gastrointestinal: Negative for abdominal pain.  Skin: Negative for rash.  Neurological: Negative for headaches.   Objective: BP 118/82 (BP Location: Left Arm, Patient Position: Sitting, Cuff Size: Normal)   Pulse 68   Resp 16   Ht 6\' 2"  (1.88 m)   Wt 212 lb 12.8 oz (96.5 kg)   BMI 27.32 kg/m  Body mass index is 27.32 kg/m. Physical  Exam  Constitutional: He is oriented to person, place, and time. He appears well-developed and well-nourished.  HENT:  Head: Normocephalic and atraumatic.  Right Ear: External ear normal.  Left Ear: External ear normal.  Nose: Nose normal.  Do not see any white plaques but does have some raw red areas on the buccal mucosa b/l.  Eyes: Conjunctivae and EOM are normal.  Neck: Neck supple.  Cardiovascular: Normal rate, regular rhythm and normal heart sounds. Exam reveals no gallop and no friction rub.  No murmur heard. Pulmonary/Chest: Effort normal and breath sounds normal. He has no wheezes. He has no rales.  Lymphadenopathy:    He has no cervical adenopathy.  Neurological: He is alert and oriented to person, place, and time.  Skin: Skin is warm. No rash noted.  Psychiatric: He has a normal mood and affect. His behavior is normal.  Nursing note and vitals reviewed.  Previous notes and tests were reviewed. The plan was reviewed with the patient/family, and all questions/concerned were addressed.  It was my pleasure to see Moss today and participate in his care. Please feel free to contact me with any questions or concerns.  Sincerely,  Wyline Mood, DO Allergy & Immunology  [facility] office: [phone] [facility] office:[phone]

## 2018-11-09 NOTE — Assessment & Plan Note (Signed)
Well-controlled with below regimen however having issues with ? Thrush versus stomatitis.   Today's spirometry showed some mild restriction.  Will switch inhaler to Dulera 200 2 puffs BID as patient did not have this oral mucosa issue with it. Use with spacer and rinse mouth afterwards. Stop Symbicort.  May use albuterol rescue inhaler 2 puffs or nebulizer every 4 to 6 hours as needed for shortness of breath, chest tightness, coughing, and wheezing. May use albuterol rescue inhaler 2 puffs 5 to 15 minutes prior to strenuous physical activities.  Continue Nucala injections.  Continue Singulair 10mg  daily.

## 2018-11-09 NOTE — Patient Instructions (Signed)
1. Continue to Treat and prevent inflammation:            A. Start Dulera 200 - 2 inhalations 2 x day with spacer and rinse mouth afterwards           B. Flonase - one spray each nostril twice per day           C. montelukast 10 mg - one tablet one time per day           D. Nucala injections  E. Use nystatin swish for the next few days. If thrush not improved let us know.    2. Continue to Treat reflux:            A. Remain away from caffeine           B. omeprazole 40 mg twice a day           C. pepcid 40mg  daily.  3. If needed:            A. ProAir HFA 2 puffs or duoneb every 4-6 hours           B. nasal saline           C. OTC antihistamine - Claritin 10 mg daily  4. Return to clinic in 6 months

## 2018-11-09 NOTE — Assessment & Plan Note (Signed)
Irritation of the oral mucosa. Questionable thrush versus stomatitis but seems to get better with oral prednisone questioning some type of inflammatory process. He did have some relief with oral nystatin.  Do another course of oral nystatin and switched his inhalers around. If this is persistent then may need biopsy for further evaluation.

## 2018-11-09 NOTE — Assessment & Plan Note (Signed)
Past history - 2018 skin testing was positive to mold Interim history - Well controlled.  May use over the counter antihistamines such as Zyrtec (cetirizine), Claritin (loratadine), Allegra (fexofenadine), or Xyzal (levocetirizine) daily as needed.

## 2018-11-09 NOTE — Assessment & Plan Note (Signed)
Stopped zantac due to recall and noticed some increased symptoms when he is bending over.  Continue omeprazole 40mg  BID  Start pepcid 40mg  daily.

## 2018-11-16 DIAGNOSIS — J4550 Severe persistent asthma, uncomplicated: Secondary | ICD-10-CM | POA: Diagnosis not present

## 2018-11-17 ENCOUNTER — Ambulatory Visit (INDEPENDENT_AMBULATORY_CARE_PROVIDER_SITE_OTHER): Payer: Medicare Other | Admitting: *Deleted

## 2018-11-17 DIAGNOSIS — J4550 Severe persistent asthma, uncomplicated: Secondary | ICD-10-CM

## 2018-11-18 ENCOUNTER — Other Ambulatory Visit: Payer: Self-pay

## 2018-11-18 ENCOUNTER — Other Ambulatory Visit: Payer: Self-pay | Admitting: Internal Medicine

## 2018-12-01 ENCOUNTER — Other Ambulatory Visit: Payer: Self-pay | Admitting: Internal Medicine

## 2018-12-06 ENCOUNTER — Telehealth: Payer: Self-pay | Admitting: *Deleted

## 2018-12-06 NOTE — Telephone Encounter (Signed)
   [facility] Pre-operative Risk Assessment    Request for surgical clearance:  1. What type of surgery is being performed? Left middle trigger finger release   2. When is this surgery scheduled? TBD   3. What type of clearance is required (medical clearance vs. Pharmacy clearance to hold med vs. Both)? medical  4. Are there any medications that need to be held prior to surgery and how long?none   5. Practice name and name of physician performing surgery? Robert wainer md   6. What is your office phone number [phone]    7.   What is your office fax number [phone]  8.   Anesthesia type (None, local, MAC, general) ? unknown   Louis Perez 12/06/2018, 7:25 AM  _________________________________________________________________   (provider comments below)

## 2018-12-06 NOTE — Telephone Encounter (Signed)
Pt seen in October and was doing well. I called to check on his current status, if still doing well can clear him. No answer so I left a VM to call back to preop clinic.

## 2018-12-08 NOTE — Telephone Encounter (Signed)
   Primary Cardiologist: Chrystie NoseKenneth C [name], MD  Chart reviewed as part of pre-operative protocol coverage. Patient was contacted 12/08/2018 in reference to pre-operative risk assessment for pending surgery as outlined below.  Louis MornLawrence E Adamski was last seen on 09/26/2018 by Dr. Rennis GoldenHilty.  Since that day, Louis Perez has done well without any chest pain or shortness of breath. He is able to walk 2-3 miles every other day without any exertional symptom  Therefore, based on ACC/AHA guidelines, the patient would be at acceptable risk for the planned procedure without further cardiovascular testing.   I will route this recommendation to the requesting party via Epic fax function and remove from pre-op pool.  Please call with questions. Although preoperative request did not mention holding any medications, however patient says he was told to hold aspirin for 1 week prior to the procedure. His last PCI was in 02/2016, last cath was 06/2016 which showed stable coronary anatomy. Ideally we usually recommend continue aspirin during the procedure if possible, however since last PCI was over 12 month ago, it should be fine. I will forward to Dr. Rennis GoldenHilty to make sure he is aware as well.    Lyndon StationHao Sixto [place], [place] 12/08/2018, 2:35 PM

## 2018-12-13 ENCOUNTER — Telehealth: Payer: Self-pay | Admitting: *Deleted

## 2018-12-13 DIAGNOSIS — J4550 Severe persistent asthma, uncomplicated: Secondary | ICD-10-CM | POA: Diagnosis not present

## 2018-12-13 MED ORDER — CYPROHEPTADINE HCL 4 MG PO TABS: 4.0000 mg | ORAL_TABLET | Freq: Two times a day (BID) | ORAL | 5 refills | Status: DC | PRN

## 2018-12-13 NOTE — Telephone Encounter (Signed)
[facility] called patient needs cyproheptadine 4mg  directions states 1-2 tablets at bedtime refilled, Dr Lucie Leatherkozlow prescribed, patient needs refills per [facility].    [facility] [phone]

## 2018-12-13 NOTE — Telephone Encounter (Signed)
Please provide refills for 6 months.

## 2018-12-13 NOTE — Telephone Encounter (Signed)
Dr. Lucie LeatherKozlow please advise refill on medication?

## 2018-12-13 NOTE — Telephone Encounter (Signed)
Script sent into pharmacy 

## 2018-12-15 ENCOUNTER — Ambulatory Visit (INDEPENDENT_AMBULATORY_CARE_PROVIDER_SITE_OTHER): Payer: Medicare Other

## 2018-12-15 DIAGNOSIS — J4550 Severe persistent asthma, uncomplicated: Secondary | ICD-10-CM | POA: Diagnosis not present

## 2018-12-16 ENCOUNTER — Other Ambulatory Visit: Payer: Self-pay | Admitting: Allergy and Immunology

## 2019-01-11 DIAGNOSIS — J4550 Severe persistent asthma, uncomplicated: Secondary | ICD-10-CM | POA: Diagnosis not present

## 2019-01-12 ENCOUNTER — Ambulatory Visit (INDEPENDENT_AMBULATORY_CARE_PROVIDER_SITE_OTHER): Payer: Medicare Other | Admitting: *Deleted

## 2019-01-12 DIAGNOSIS — J4550 Severe persistent asthma, uncomplicated: Secondary | ICD-10-CM | POA: Diagnosis not present

## 2019-02-07 DIAGNOSIS — J4550 Severe persistent asthma, uncomplicated: Secondary | ICD-10-CM | POA: Diagnosis not present

## 2019-02-09 ENCOUNTER — Telehealth: Payer: Self-pay | Admitting: Allergy and Immunology

## 2019-02-09 ENCOUNTER — Other Ambulatory Visit: Payer: Self-pay | Admitting: *Deleted

## 2019-02-09 ENCOUNTER — Ambulatory Visit (INDEPENDENT_AMBULATORY_CARE_PROVIDER_SITE_OTHER): Payer: Medicare Other | Admitting: *Deleted

## 2019-02-09 DIAGNOSIS — J4550 Severe persistent asthma, uncomplicated: Secondary | ICD-10-CM | POA: Diagnosis not present

## 2019-02-09 MED ORDER — MONTELUKAST SODIUM 10 MG PO TABS: 10.0000 mg | ORAL_TABLET | Freq: Every day | ORAL | 5 refills | Status: DC

## 2019-02-09 MED ORDER — CYPROHEPTADINE HCL 4 MG PO TABS: 4.0000 mg | ORAL_TABLET | Freq: Two times a day (BID) | ORAL | 5 refills | Status: DC | PRN

## 2019-02-09 NOTE — Telephone Encounter (Signed)
Patient stopped in and is requesting refills for Montelukast and Cyproheptad. [facility] in [place].

## 2019-02-09 NOTE — Telephone Encounter (Signed)
Refills have been sent in to requested pharmacy.

## 2019-02-28 ENCOUNTER — Other Ambulatory Visit: Payer: Self-pay | Admitting: Internal Medicine

## 2019-03-03 ENCOUNTER — Other Ambulatory Visit: Payer: Self-pay | Admitting: Internal Medicine

## 2019-03-08 DIAGNOSIS — J4550 Severe persistent asthma, uncomplicated: Secondary | ICD-10-CM | POA: Diagnosis not present

## 2019-03-09 ENCOUNTER — Ambulatory Visit (INDEPENDENT_AMBULATORY_CARE_PROVIDER_SITE_OTHER): Payer: Medicare Other | Admitting: *Deleted

## 2019-03-09 ENCOUNTER — Telehealth: Payer: Self-pay | Admitting: *Deleted

## 2019-03-09 DIAGNOSIS — J4550 Severe persistent asthma, uncomplicated: Secondary | ICD-10-CM | POA: Diagnosis not present

## 2019-03-09 MED ORDER — NYSTATIN 100000 UNIT/ML MT SUSP: 5.0000 mL | Freq: Four times a day (QID) | OROMUCOSAL | 0 refills | Status: DC

## 2019-03-09 NOTE — Telephone Encounter (Signed)
Yes please refill as previously prescribed

## 2019-03-09 NOTE — Telephone Encounter (Signed)
Refill sent in to pharmacy 

## 2019-03-09 NOTE — Telephone Encounter (Addendum)
Patient would like a refill on his Nystatin swish and swallow. Can we refill? Please advise.

## 2019-03-26 ENCOUNTER — Other Ambulatory Visit: Payer: Self-pay | Admitting: Allergy and Immunology

## 2019-04-05 DIAGNOSIS — J4550 Severe persistent asthma, uncomplicated: Secondary | ICD-10-CM

## 2019-04-06 ENCOUNTER — Ambulatory Visit (INDEPENDENT_AMBULATORY_CARE_PROVIDER_SITE_OTHER): Payer: Medicare Other | Admitting: *Deleted

## 2019-04-06 DIAGNOSIS — J4550 Severe persistent asthma, uncomplicated: Secondary | ICD-10-CM | POA: Diagnosis not present

## 2019-04-10 ENCOUNTER — Other Ambulatory Visit: Payer: Self-pay | Admitting: Allergy and Immunology

## 2019-05-03 DIAGNOSIS — J4550 Severe persistent asthma, uncomplicated: Secondary | ICD-10-CM | POA: Diagnosis not present

## 2019-05-04 ENCOUNTER — Ambulatory Visit (INDEPENDENT_AMBULATORY_CARE_PROVIDER_SITE_OTHER): Payer: Medicare Other | Admitting: *Deleted

## 2019-05-04 ENCOUNTER — Other Ambulatory Visit: Payer: Self-pay

## 2019-05-04 DIAGNOSIS — J4550 Severe persistent asthma, uncomplicated: Secondary | ICD-10-CM

## 2019-05-04 MED ORDER — MEPOLIZUMAB 100 MG ~~LOC~~ SOLR
100.0000 mg | SUBCUTANEOUS | Status: DC
  Administered 2019-05-04 – 2021-02-04 (×23): 100 mg via SUBCUTANEOUS

## 2019-05-07 ENCOUNTER — Other Ambulatory Visit: Payer: Self-pay | Admitting: Allergy and Immunology

## 2019-05-25 ENCOUNTER — Other Ambulatory Visit: Payer: Self-pay | Admitting: Internal Medicine

## 2019-05-29 ENCOUNTER — Telehealth: Payer: Self-pay | Admitting: Internal Medicine

## 2019-05-29 NOTE — Telephone Encounter (Signed)
Spoke with patient who wishes to schedule OV. Scheduled for first available APP flex clinic visit with A. Duke PA on 6/19@ 11:15am

## 2019-05-29 NOTE — Telephone Encounter (Signed)
Pt called to report that he has been having increased SOB with exertion only such as walking up stairs and inclines over the past month which he says is new for him. He denies peripheral edema, some mild chest pain that he says he always has since his stent. He denies dizziness. He can lay flat at night.   He does not have BP readings recorded ut last night it was 170/104 but this morning it was 133/73.. he says it usually stays like it is this morning but 2 weeks he had another spike with a headache.   His last OV was 09/2018 and he would like to have another re: this new change..   Will forward to Dr. Josepha Pigg for review.

## 2019-05-29 NOTE — Telephone Encounter (Signed)
Ok to arrange for an office visit to see APP this week or me next week on DOD day.  Dr. Lemmie Evens

## 2019-05-29 NOTE — Telephone Encounter (Signed)
New Message           Pt c/o Shortness Of Breath: STAT if SOB developed within the last 24 hours or pt is noticeably SOB on the phone  1. Are you currently SOB (can you hear that pt is SOB on the phone)? No 2. How long have you been experiencing SOB? About a month 3. Are you SOB when sitting or when up moving around? Moving around 4.  Are you currently experiencing any other symptoms? Mild chest pains        Patient would like to e worked in some time this week.

## 2019-05-31 DIAGNOSIS — J4550 Severe persistent asthma, uncomplicated: Secondary | ICD-10-CM | POA: Diagnosis not present

## 2019-06-01 ENCOUNTER — Ambulatory Visit (INDEPENDENT_AMBULATORY_CARE_PROVIDER_SITE_OTHER): Payer: Medicare Other

## 2019-06-01 ENCOUNTER — Other Ambulatory Visit: Payer: Self-pay

## 2019-06-01 DIAGNOSIS — J4550 Severe persistent asthma, uncomplicated: Secondary | ICD-10-CM

## 2019-06-01 DIAGNOSIS — I10 Essential (primary) hypertension: Secondary | ICD-10-CM

## 2019-06-01 NOTE — Progress Notes (Signed)
Cardiology Clinic Note   Patient Name: Louis Perez Date of Encounter: 06/02/2019  Primary Care Provider:  Lahoma Perez, [facility] Cardiologist:  Louis NoseKenneth C [name], MD  Patient Profile    Chest pain and shortness of breath  Past Medical History    Past Medical History:  Diagnosis Date  . Arthritis    "hips, lower back, knees, shoulders, hands" (05/14/2016)  . Asthma dx'd in 2014  . Bacterial septicemia (HCC) 01/2011   "both knee involved"  . CAD (coronary artery disease)    a. NSTEMI 02/2016 s/p DES to LAD. b. Relook cath due to angina/prior high risk anatomy 05/14/16 -> continued medical therapy given concern that a stent strut could inferere with Cx itself.  Marland Kitchen. GERD (gastroesophageal reflux disease)   . Gilbert's disease   . Hyperlipidemia   . Hypertension   . 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 NSTEMI (non-ST elevated myocardial infarction) (HCC) 02/2016   Louis Perez/notes 03/02/2016  . Sinus bradycardia   . Unspecified tinnitus    right   Past Surgical History:  Procedure Laterality Date  . BACK SURGERY    . CARDIAC CATHETERIZATION N/A 03/01/2016   Procedure: Left Heart Cath and Coronary Angiography;  Surgeon: Louis BollmanMichael Cooper, MD;  oLAD 95%, CFX/RI/RCA/RPDA 25-30%  . CARDIAC CATHETERIZATION N/A 03/01/2016   Procedure: Coronary Stent Intervention;  Surgeon: Louis BollmanMichael Cooper, MD;  Promus Premier 4.0 x  12 mm DES to oLAD  . CARDIAC CATHETERIZATION N/A 05/14/2016   Procedure: Left Heart Cath and Coronary Angiography;  Surgeon: Louis Biharihomas A Kelly, MD;  [facility];  Service: Cardiovascular;  Laterality: N/A;  . CARDIAC CATHETERIZATION N/A 06/17/2016   Procedure: Intravascular Pressure Wire/FFR Study;  Surgeon: Louis BollmanMichael Cooper, MD;  [facility];  Service: Cardiovascular;  Laterality: N/A;  . CARDIOVASCULAR STRESS TEST  05/26/2012   EKG negative for ischemia, no ECG changes, no significant ischemia noted  . I&D KNEE WITH POLY  EXCHANGE Bilateral 01/2011   & complete synovectomy of 3 compartments of the right total knee/notes 02/04/2011 (replacement,partial knee replacement because of bacterial infection)  . JOINT REPLACEMENT    . KNEE ARTHROSCOPY Right ~ 2002  . LUMBAR DISC SURGERY  ~ 1980; 2000  . NASAL SINUS SURGERY  08/2011  . SHOULDER ARTHROSCOPY W/ ROTATOR CUFF REPAIR Right 01/2015  . TOTAL KNEE ARTHROPLASTY Bilateral 03/26/2004-09/15/2004    right-left  . TRANSTHORACIC ECHOCARDIOGRAM  05/26/2012   EF >55%, mild concentric LVH    Allergies  Allergies  Allergen Reactions  . Oxycontin [Oxycodone Hcl] Anaphylaxis  . Percocet [Oxycodone-Acetaminophen] Anaphylaxis    Tolerates Vicodin  . Ambien [Zolpidem Tartrate] Other (See Comments)    Mood changes     History of Present Illness    Mr. Louis Perez 68 year old male past medical history of essential hypertension, unstable angina, sinus bradycardia, GERD, dyslipidemia, and coronary artery disease.  NSTEMI and cath 02/2016 with DES of the LAD, nonobstructive disease elsewhere and normal ejection fraction. 2.  Cath 05/2016-ramus 80% stenosis, distal RCA 20% stenosed, normal LV function and no intervention. 3.  Cath 7/17- proximal RCA to mid RCA lesion 25% stenosed, OST LM lesion 25% stenosed, OST ramus lesion 75% stenosed, mid LAD to distal LAD lesion 50% stenosed, OST second diagonal to second diagonal lesion 30% stenosed.  No intervention, medical therapy.  Echocardiogram: 02/2016 LVEF 60 to 65%, moderate LVH, grade 1 diastolic dysfunction, MVR trivial, tricuspid valve mild regurg, PA pressure 25 mmHg.   Patient  call office 05/29/2019 reporting increased shortness of breath with exertion such as walking upstairs inclines in the past month.  He  also reported increased blood pressure 170/104.  He presents today with no chest pain.  He states he has been active at home unloading mulch, doing yard work and also regularly golfs.  During these activities he states that he  has shortness of breath when he has to walk up steep inclines.  Today he climbed the stairs to the office and stated he had no chest pain but was briefly out of breath.  He states that he is not taking Dulera as prescribed, he is only taking 1 puff in the a.m.  He states that his fitness is not as good as it was last fall.  He was routinely walking 20 miles per week and states that currently he is only walking about 6.  He states he has been taking Aleve daily for arthritis pain and likes to eat snack foods as he has not been as active.  He denies chest pain, lower extremity edema, dizziness, melena, hematuria, hemoptysis, orthopnea and PND.    Home Medications    Prior to Admission medications   Medication Sig Start Date End Date Taking? Authorizing Provider  albuterol (PROAIR HFA) 108 (90 Base) MCG/ACT inhaler Inhale 2 puffs into the lungs every 4 (four) hours as needed for wheezing or shortness of breath. 06/01/17   Louis Perez, Alvira PhilipsEric J, MD  aspirin 81 MG tablet Take 81 mg by mouth at bedtime.     [provider]  atorvastatin (LIPITOR) 80 MG tablet TAKE 1 TABLET BY MOUTH ONCE DAILY AT [facility]6PM 05/25/19   Perez, Louis AbuKenneth C, MD  cetirizine (ZYRTEC) 10 MG tablet Take 10 mg by mouth daily.    [provider]  cyproheptadine (PERIACTIN) 4 MG tablet Take 1 tablet (4 mg total) by mouth 2 (two) times daily as needed for allergies. 02/09/19   Louis Perez, Alvira PhilipsEric J, MD  famotidine (PEPCID) 40 MG tablet Take 1 tablet (40 mg total) by mouth daily. 11/09/18   Louis Perez, Louis M, DO  fluticasone Aleda Grana(FLONASE) 50 MCG/ACT nasal spray Use one spray in each nostril twice daily 06/01/17   Louis Perez, Alvira PhilipsEric J, MD  Garlic 1000 MG CAPS Take 1,000 mg by mouth daily.     [provider]  ipratropium-albuterol (DUONEB) 0.5-2.5 (3) MG/3ML SOLN Take 3 mLs by nebulization every 6 (six) hours as needed. 08/19/17   Louis Perez, Alvira PhilipsEric J, MD  isosorbide mononitrate (IMDUR) 60 MG 24 hr tablet Take 1 tablet (60 mg total) by mouth daily. 11/18/18    Perez, Louis AbuKenneth C, MD  lidocaine (XYLOCAINE) 2 % solution RINSE ORAL CAVITY AS NEEDED AND SPIT OUT 10/20/18   [provider]  losartan (COZAAR) 50 MG tablet TAKE 1 TABLET BY MOUTH EVERY DAY 12/01/18   Perez, Louis AbuKenneth C, MD  meclizine (ANTIVERT) 25 MG tablet TAKE 1/2 TAB TWICE A DAY FOR A WEEK IF NEEDED CAN INCREASE TO 1 TAB TWICE A DAY 10/21/18   [provider]  mometasone-formoterol (DULERA) 200-5 MCG/ACT AERO Inhale 2 puffs into the lungs 2 (two) times daily. 11/09/18   Louis Perez, Louis M, DO  montelukast (SINGULAIR) 10 MG tablet Take 1 tablet (10 mg total) by mouth at bedtime. 02/09/19   Louis Perez, Alvira PhilipsEric J, MD  Multiple Vitamin (MULTIVITAMIN WITH MINERALS) TABS tablet Take 1 tablet by mouth daily.    [provider]  naproxen sodium (ALEVE) 220 MG tablet Take 220 mg by mouth daily as  needed (PAIN).    [provider]  nitroGLYCERIN (NITROSTAT) 0.4 MG SL tablet PLACE 1 TABLET UNDER TONGUE EVERY 5 MINUTES FOR UP TO 3 DOSES AS NEEDED FOR CHEST PAIN 03/03/19   Perez, Nadean Corwin, MD  nortriptyline (PAMELOR) 10 MG capsule Take 10 mg by mouth 2 (two) times daily.    [provider]  nystatin (MYCOSTATIN) 100000 UNIT/ML suspension Take 5 mLs (500,000 Units total) by mouth 4 (four) times daily. 3-4 times daily swish and spit. 03/09/19   Louis Perez, Donnamarie Poag, MD  omeprazole (PRILOSEC) 40 MG capsule Take 1 capsule by mouth twice daily 05/09/19   Louis Perez, Donnamarie Poag, MD  predniSONE (DELTASONE) 20 MG tablet TAKE 1 TABLET BY MOUTH THREE TIMES DAILY FOR 3 DAYS THEN 1 TABLET TWICE DAILY FOR 3 DAYS THEN 1 TABLET ONCE DAILY FOR 3 DAYS THEN OFF 11/07/18   [provider]  ranitidine (ZANTAC) 300 MG tablet Take 1 tablet (300 mg total) by mouth at bedtime. Patient not taking: Reported on 11/09/2018 10/28/18   Jiles Prows, MD    Family History    Family History  Problem Relation Age of Onset  . Dementia Father   . Alzheimer's disease Father   . Heart disease Mother        Onset-Late  26s  . Heart disease Brother 33  . Cancer Paternal Aunt   . Cancer Paternal Uncle   . Cancer Paternal Uncle   . Cancer Paternal Uncle    He indicated that his mother is deceased. He indicated that his father is deceased. He indicated that his brother is alive. He indicated that the status of his paternal aunt is unknown.  Social History    Social History   Socioeconomic History  . Marital status: Married    Spouse name: Not on file  . Number of children: 2  . Years of education: 30  . Highest education level: Not on file  Occupational History  . Occupation: retired Therapist, nutritional, part-time security work  Social Needs  . Financial resource strain: Not on file  . Food insecurity    Worry: Not on file    Inability: Not on file  . Transportation needs    Medical: Not on file    Non-medical: Not on file  Tobacco Use  . Smoking status: Former Smoker    Packs/day: 1.00    Years: 5.00    Pack years: 5.00    Types: Cigarettes    Quit date: 12/14/1968    Years since quitting: 50.4  . Smokeless tobacco: Never Used  Substance and Sexual Activity  . Alcohol use: Yes    Alcohol/week: 1.0 standard drinks    Types: 1 Cans of beer per week    Comment: Occasional  . Drug use: No  . Sexual activity: Not on file    Comment: Married  Lifestyle  . Physical activity    Days per week: Not on file    Minutes per session: Not on file  . Stress: Not on file  Relationships  . Social Herbalist on phone: Not on file    Gets together: Not on file    Attends religious service: Not on file    Active member of club or organization: Not on file    Attends meetings of clubs or organizations: Not on file    Relationship status: Not on file  . Intimate partner violence    Fear of current or ex partner: Not on  file    Emotionally abused: Not on file    Physically abused: Not on file    Forced sexual activity: Not on file  Other Topics Concern  . Not on file  Social History  Narrative   Patient lives at home with his wife . He is retired. Patient has 14 years of education. Patient has two children.   Caffeine - two cups daily.   Right-handed     Review of Systems    General:  No chills, fever, night sweats or weight changes.  Cardiovascular:  No chest pain, dyspnea on exertion, edema, orthopnea, palpitations, paroxysmal nocturnal dyspnea. Dermatological: No rash, lesions/masses Respiratory: No cough, dyspnea Urologic: No hematuria, dysuria Abdominal:   No nausea, vomiting, diarrhea, bright red blood per rectum, melena, or hematemesis MS: Bilateral hand arthritis no deformities. Neurologic:  No visual changes, wkns, changes in mental status. All other systems reviewed and are otherwise negative except as noted above.  Physical Exam    VS:  BP (!) 148/99   Pulse 62   Ht 6\' 2"  (1.88 m)   Wt 214 lb 3.2 oz (97.2 kg)   BMI 27.50 kg/m  , BMI Body mass index is 27.5 kg/m. GEN: Well nourished, well developed, in no acute distress. HEENT: normal. Neck: Supple, no JVD, carotid bruits, or masses. Cardiac: RRR, no murmurs, rubs, or gallops. No clubbing, cyanosis, edema.  Radials/DP/PT 2+ and equal bilaterally.  Respiratory:  Respirations regular and unlabored, clear to auscultation bilaterally. GI: Soft, nontender, nondistended, BS + x 4. MS: no deformity or atrophy.  Skin: warm and dry, no rash. Neuro:  Strength and sensation are intact. Psych: Normal affect.  Accessory Clinical Findings    ECG personally reviewed by me today- NSR 62 BMP - No acute changes  Assessment & Plan   1.  Coronary artery disease- no chest pain Continue isopropamide mononitrate 60 mg tablet Continue aspirin 81 mg tablet   2.  Essential hypertension- (148/99) labile Discontinue losartan 50 mg tablet daily Prescribe olmesartan 10 mg tablet daily Stop taking Aleve Maintain low-sodium diet and stop eating salty snacks. Increase physical activity as tolerated Bring blood  pressure cuff and blood pressure log to next appointment.  3.  Hyperlipidemia-LDL 73 (10/2018) Continue atorvastatin 80 mg tablet daily [facility]  4.  Arthritis- arthralgia bilateral hands, no deformity Start taking Tylenol Prescribed diclofenac gel topical Continue to stay active  Disposition: Follow-up in 1 week with APP for BMP, blood pressure check, blood pressure cuff evaluation.  Ronney AstersJesse M Caroll Weinheimer, NP 06/02/2019, 12:14 PM

## 2019-06-02 ENCOUNTER — Encounter: Payer: Self-pay | Admitting: Physician Assistant

## 2019-06-02 ENCOUNTER — Ambulatory Visit (INDEPENDENT_AMBULATORY_CARE_PROVIDER_SITE_OTHER): Payer: Medicare Other | Admitting: Physician Assistant

## 2019-06-02 VITALS — BP 148/99 | HR 62 | Ht 74.0 in | Wt 214.2 lb

## 2019-06-02 DIAGNOSIS — I2510 Atherosclerotic heart disease of native coronary artery without angina pectoris: Secondary | ICD-10-CM

## 2019-06-02 DIAGNOSIS — M1990 Unspecified osteoarthritis, unspecified site: Secondary | ICD-10-CM | POA: Diagnosis not present

## 2019-06-02 DIAGNOSIS — I10 Essential (primary) hypertension: Secondary | ICD-10-CM

## 2019-06-02 DIAGNOSIS — E785 Hyperlipidemia, unspecified: Secondary | ICD-10-CM

## 2019-06-02 MED ORDER — OMEPRAZOLE 40 MG PO CPDR: 40.0000 mg | DELAYED_RELEASE_CAPSULE | Freq: Two times a day (BID) | ORAL | 0 refills | Status: DC

## 2019-06-02 MED ORDER — DICLOFENAC SODIUM 1 % TD GEL: 2.0000 g | Freq: Four times a day (QID) | TRANSDERMAL | 0 refills | Status: DC

## 2019-06-02 MED ORDER — OLMESARTAN MEDOXOMIL 5 MG PO TABS: 10.0000 mg | ORAL_TABLET | Freq: Every day | ORAL | 3 refills | Status: DC

## 2019-06-02 NOTE — Patient Instructions (Signed)
Medication Instructions:  STOP Losartan  STOP Aleve  START Olmesartan (Benicar) 10 mg--two 5 mg tablets daily.  START Voltaren Gel---can use up to 4 times daily topically for arthritis pain  Ok to take Tylenol 650 mg for arthritis pain.  If you need a refill on your cardiac medications before your next appointment, please call your pharmacy.   Follow-Up: At [facility], you and your health needs are our priority.  As part of our continuing mission to provide you with exceptional heart care, we have created designated Provider Care Teams.  These Care Teams include your primary Cardiologist (physician) and Advanced Practice Providers (APPs -  Physician Assistants and Nurse Practitioners) who all work together to provide you with the care you need, when you need it. . You have been scheduled for a follow-up visit in the office on Thursday, 06/08/19 at 2:15 PM.  Any Other Special Instructions Will Be Listed Below (If Applicable). Please bring your blood pressure cuff with you to this appointment.

## 2019-06-05 ENCOUNTER — Other Ambulatory Visit (INDEPENDENT_AMBULATORY_CARE_PROVIDER_SITE_OTHER): Payer: Medicare Other

## 2019-06-05 DIAGNOSIS — I10 Essential (primary) hypertension: Secondary | ICD-10-CM

## 2019-06-07 ENCOUNTER — Telehealth: Payer: Self-pay | Admitting: Cardiology

## 2019-06-07 NOTE — Telephone Encounter (Signed)
Spoke with patient to remind him on wear mask and no visitors may accompany him to appt on 06/08/19.

## 2019-06-08 ENCOUNTER — Ambulatory Visit (INDEPENDENT_AMBULATORY_CARE_PROVIDER_SITE_OTHER): Payer: Medicare Other | Admitting: General Practice

## 2019-06-08 ENCOUNTER — Encounter: Payer: Self-pay | Admitting: General Practice

## 2019-06-08 ENCOUNTER — Other Ambulatory Visit: Payer: Self-pay

## 2019-06-08 VITALS — BP 125/76 | HR 75 | Ht 74.0 in | Wt 213.0 lb

## 2019-06-08 DIAGNOSIS — M1990 Unspecified osteoarthritis, unspecified site: Secondary | ICD-10-CM

## 2019-06-08 DIAGNOSIS — I10 Essential (primary) hypertension: Secondary | ICD-10-CM

## 2019-06-08 DIAGNOSIS — E785 Hyperlipidemia, unspecified: Secondary | ICD-10-CM | POA: Diagnosis not present

## 2019-06-08 DIAGNOSIS — I2510 Atherosclerotic heart disease of native coronary artery without angina pectoris: Secondary | ICD-10-CM

## 2019-06-08 MED ORDER — DICLOFENAC SODIUM 1 % TD GEL: 2.0000 g | Freq: Four times a day (QID) | TRANSDERMAL | 0 refills | Status: DC

## 2019-06-08 NOTE — Patient Instructions (Signed)
Medication Instructions:  Your physician recommends that you continue on your current medications as directed. Please refer to the Current Medication list given to you today. If you need a refill on your cardiac medications before your next appointment, please call your pharmacy.   Lab work: Your physician recommends that you return for lab work in: TODAY-BMET If you have labs (blood work) drawn today and your tests are completely normal, you will receive your results only by: Marland Kitchen MyChart Message (if you have MyChart) OR . A paper copy in the mail If you have any lab test that is abnormal or we need to change your treatment, we will call you to review the results.  Testing/Procedures: NONE  Follow-Up: At [facility], you and your health needs are our priority.  As part of our continuing mission to provide you with exceptional heart care, we have created designated Provider Care Teams.  These Care Teams include your primary Cardiologist (physician) and Advanced Practice Providers (APPs -  Physician Assistants and Nurse Practitioners) who all work together to provide you with the care you need, when you need it. You will need a follow up appointment in 6 months.  Please call our office 2 months in advance to schedule this appointment.  You may see Pixie Casino, MD or one of the following Advanced Practice Providers on your designated Care Team: [place], [place] . Fabian Sharp, PA-C  Any Other Special Instructions Will Be Listed Below (If Applicable).

## 2019-06-08 NOTE — Progress Notes (Signed)
Cardiology Clinic Note   Patient Name: Louis Perez Date of Encounter: 06/08/2019  Primary Care Provider:  [place], [facility] Cardiologist:  Louis NoseKenneth C [name], Perez  Patient Profile    Louis Perez 68 year old male presents today for blood pressure recheck and follow-up labs.    Past Medical History    Past Medical History:  Diagnosis Date  . Arthritis    "hips, lower back, knees, shoulders, hands" (05/14/2016)  . Asthma dx'd in 2014  . Bacterial septicemia (HCC) 01/2011   "both knee involved"  . CAD (coronary artery disease)    a. NSTEMI 02/2016 s/p DES to LAD. b. Relook cath due to angina/prior high risk anatomy 05/14/16 -> continued medical therapy given concern that a stent strut could inferere with Cx itself.  Marland Kitchen. GERD (gastroesophageal reflux disease)   . Gilbert's disease   . Hyperlipidemia   . Hypertension   . 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 NSTEMI (non-ST elevated myocardial infarction) (HCC) 02/2016   Louis Perez/notes 03/02/2016  . Sinus bradycardia   . Unspecified tinnitus    right   Past Surgical History:  Procedure Laterality Date  . BACK SURGERY    . CARDIAC CATHETERIZATION N/A 03/01/2016   Procedure: Left Heart Cath and Coronary Angiography;  Surgeon: Louis BollmanMichael Cooper, Perez;  oLAD 95%, CFX/RI/RCA/RPDA 25-30%  . CARDIAC CATHETERIZATION N/A 03/01/2016   Procedure: Coronary Stent Intervention;  Surgeon: Louis BollmanMichael Cooper, Perez;  Promus Premier 4.0 x  12 mm DES to oLAD  . CARDIAC CATHETERIZATION N/A 05/14/2016   Procedure: Left Heart Cath and Coronary Angiography;  Surgeon: Louis Biharihomas A Kelly, Perez;  [facility];  Service: Cardiovascular;  Laterality: N/A;  . CARDIAC CATHETERIZATION N/A 06/17/2016   Procedure: Intravascular Pressure Wire/FFR Study;  Surgeon: Louis BollmanMichael Cooper, Perez;  [facility];  Service: Cardiovascular;  Laterality: N/A;  . CARDIOVASCULAR STRESS TEST  05/26/2012   EKG negative for ischemia, no ECG  changes, no significant ischemia noted  . I&D KNEE WITH POLY EXCHANGE Bilateral 01/2011   & complete synovectomy of 3 compartments of the right total knee/notes 02/04/2011 (replacement,partial knee replacement because of bacterial infection)  . JOINT REPLACEMENT    . KNEE ARTHROSCOPY Right ~ 2002  . LUMBAR DISC SURGERY  ~ 1980; 2000  . NASAL SINUS SURGERY  08/2011  . SHOULDER ARTHROSCOPY W/ ROTATOR CUFF REPAIR Right 01/2015  . TOTAL KNEE ARTHROPLASTY Bilateral 03/26/2004-09/15/2004    right-left  . TRANSTHORACIC ECHOCARDIOGRAM  05/26/2012   EF >55%, mild concentric LVH    Allergies  Allergies  Allergen Reactions  . Oxycontin [Oxycodone Hcl] Anaphylaxis  . Percocet [Oxycodone-Acetaminophen] Anaphylaxis    Tolerates Vicodin  . Ambien [Zolpidem Tartrate] Other (See Comments)    Mood changes     History of Present Illness  Mr. Louis Perez 68 year old male presents today for follow-up blood pressure check and follow-up BMP.  He has a past medical history of essential hypertension, unstable angina, sinus bradycardia, GERD, dyslipidemia and coronary artery disease.  NSTEMI and cath 02/2016 with drug-eluting stent of the LAD, nonintrusive disease elsewhere and normal ejection fraction. 2.  Cath 05/2016-ramus 80% stenosis, distal RCA 20% stenosed, normal LV function and no intervention. 3.  Cath 7/17-proximal RCA to mid RCA lesion 25% stenosed, OST LM lesion 25% stenosed, OST ramus lesion 75% stenosed, mid LAD to distal LAD lesions 50% stenosed, OST second diagonal to second diagonal lesion 30% stenosed.  No intervention treated with medical therapy.  Echocardiogram:  02/2016 LVEF 60 to 65%, moderate LV H, grade 1 diastolic dysfunction, MVR trivial, tricuspid valve mild regurg, PA pressure 25 mmHg  In the office today he states he feels well and has increased his walking to 3 miles per day.  He states that he has been having lower leg and upper leg posterior cramps.  He also continues to Perez his yard work  and Systems analystgolf.  During golfing he says he walks beside the car much of the time.  He brings his blood pressure log for the last week and exercise/activity log.  Blood pressures are well maintained.  Patient also brought his blood pressure cuff which showed only a five-point discrepancy between our blood pressure cuff.  He denies chest pain, lower extremity edema, dizziness, melena, hematuria, hemoptysis, orthopnea and PND.    Home Medications    Prior to Admission medications   Medication Sig Start Date End Date Taking? Authorizing Provider  albuterol (PROAIR HFA) 108 (90 Base) MCG/ACT inhaler Inhale 2 puffs into the lungs every 4 (four) hours as needed for wheezing or shortness of breath. 06/01/17   Louis Perez  aspirin 81 MG tablet Take 81 mg by mouth at bedtime.     Provider, Historical, Perez  atorvastatin (LIPITOR) 80 MG tablet TAKE 1 TABLET BY MOUTH ONCE DAILY AT [facility]6PM 05/25/19   Perez, Louis AbuKenneth C, Perez  diclofenac sodium (VOLTAREN) 1 % GEL Apply 2 g topically 4 (four) times daily. 06/02/19   Louis Perez, Louis Bennetts Perez, Perez  famotidine (PEPCID) 40 MG tablet Take 1 tablet (40 mg total) by mouth daily. 11/09/18   Louis Perez  fluticasone Aleda Grana(FLONASE) 50 MCG/ACT nasal spray Use one spray in each nostril twice daily 06/01/17   Louis Perez  Garlic 1000 MG CAPS Take 1,000 mg by mouth daily.     Provider, Historical, Perez  ipratropium-albuterol (DUONEB) 0.5-2.5 (3) MG/3ML SOLN Take 3 mLs by nebulization every 6 (six) hours as needed. 08/19/17   Louis Perez  isosorbide mononitrate (IMDUR) 60 MG 24 hr tablet Take 1 tablet (60 mg total) by mouth daily. 11/18/18   Perez, Louis AbuKenneth C, Perez  mometasone-formoterol (DULERA) 200-5 MCG/ACT AERO Inhale 2 puffs into the lungs 2 (two) times daily. 11/09/18   Louis Perez  montelukast (SINGULAIR) 10 MG tablet Take 1 tablet (10 mg total) by mouth at bedtime. 02/09/19   Louis Perez  Multiple Vitamin (MULTIVITAMIN WITH MINERALS) TABS tablet Take 1 tablet by mouth  daily.    Provider, Historical, Perez  nitroGLYCERIN (NITROSTAT) 0.4 MG SL tablet PLACE 1 TABLET UNDER TONGUE EVERY 5 MINUTES FOR UP TO 3 DOSES AS NEEDED FOR CHEST PAIN 03/03/19   Perez, Louis AbuKenneth C, Perez  olmesartan (BENICAR) 5 MG tablet Take 2 tablets (10 mg total) by mouth daily. 06/02/19   Louis Perez, Porcha Deblanc Perez, Perez  omeprazole (PRILOSEC) 40 MG capsule Take 1 capsule (40 mg total) by mouth 2 (two) times daily. 06/02/19   Louis Perez, Nicol Herbig Perez, Perez    Family History    Family History  Problem Relation Age of Onset  . Dementia Father   . Alzheimer's disease Father   . Heart disease Mother        Onset-Late 4940s  . Heart disease Brother [id number]  . Cancer Paternal Aunt   . Cancer Paternal Uncle   . Cancer Paternal Uncle   . Cancer Paternal Uncle    He indicated that his mother is deceased. He indicated that his father  is deceased. He indicated that his brother is alive. He indicated that the status of his paternal aunt is unknown.  Social History    Social History   Socioeconomic History  . Marital status: Married    Spouse name: Not on file  . Number of children: 2  . Years of education: 6414  . Highest education level: Not on file  Occupational History  . Occupation: retired Architectolice Chief, part-time security work  Social Needs  . Financial resource strain: Not on file  . Food insecurity    Worry: Not on file    Inability: Not on file  . Transportation needs    Medical: Not on file    Non-medical: Not on file  Tobacco Use  . Smoking status: Former Smoker    Packs/day: 1.00    Years: 5.00    Pack years: 5.00    Types: Cigarettes    Quit date: 12/14/1968    Years since quitting: 50.5  . Smokeless tobacco: Never Used  Substance and Sexual Activity  . Alcohol use: Yes    Alcohol/week: 1.0 standard drinks    Types: 1 Cans of beer per week    Comment: Occasional  . Drug use: No  . Sexual activity: Not on file    Comment: Married  Lifestyle  . Physical activity    Days per week: Not on file     Minutes per session: Not on file  . Stress: Not on file  Relationships  . Social Musicianconnections    Talks on phone: Not on file    Gets together: Not on file    Attends religious service: Not on file    Active member of club or organization: Not on file    Attends meetings of clubs or organizations: Not on file    Relationship status: Not on file  . Intimate partner violence    Fear of current or ex partner: Not on file    Emotionally abused: Not on file    Physically abused: Not on file    Forced sexual activity: Not on file  Other Topics Concern  . Not on file  Social History Narrative   Patient lives at home with his wife . He is retired. Patient has 14 years of education. Patient has two children.   Caffeine - two cups daily.   Right-handed     Review of Systems    General:  No chills, fever, night sweats or weight changes.  Cardiovascular:  No chest pain, dyspnea on exertion, edema, orthopnea, palpitations, paroxysmal nocturnal dyspnea. Dermatological: No rash, lesions/masses Respiratory: No cough, dyspnea Urologic: No hematuria, dysuria Abdominal:   No nausea, vomiting, diarrhea, bright red blood per rectum, melena, or hematemesis Neurologic:  No visual changes, wkns, changes in mental status. All other systems reviewed and are otherwise negative except as noted above.  Physical Exam    VS:  There were no vitals taken for this visit. , BMI There is no height or weight on file to calculate BMI. GEN: Well nourished, well developed, in no acute distress. HEENT: normal. Neck: Supple, no JVD, carotid bruits, or masses. Cardiac: RRR, no murmurs, rubs, or gallops. No clubbing, cyanosis, edema.  Radials/DP/PT 2+ and equal bilaterally.  Respiratory:  Respirations regular and unlabored, clear to auscultation bilaterally. GI: Soft, nontender, nondistended, BS + x 4. MS: no deformity or atrophy. Skin: warm and dry, no rash. Neuro:  Strength and sensation are intact. Psych: Normal  affect.  Accessory Clinical Findings  ECG personally reviewed by me today-No EKG today.  Assessment & Plan    1.  Essential hypertension- (125/76)  today Continue olmesartan 10 mg tablet daily Continue to stay away from NSAID class. Maintain low-sodium diet and stop eating salty snacks. Increase physical activity as tolerated Bring blood pressure log to next visit. Obtain BMP today.  2.  Hyperlipidemia-LDL 73 (10/2018) Continue atorvastatin 80 mg tablet daily Followed by [facility]  3.  Arthritis- arthralgia bilateral hands, no deformity Continue taking Tylenol as needed for arthritic pain. Continue diclofenac gel topical Continue to stay active  Disposition: Follow-up in 6 with Dr. Imagene Gurney, Perez 06/08/2019, 7:46 AM

## 2019-06-09 LAB — BASIC METABOLIC PANEL
BUN/Creatinine Ratio: 23 (ref 10–24)
BUN: 19 mg/dL (ref 8–27)
CO2: 27 mmol/L (ref 20–29)
Calcium: 10 mg/dL (ref 8.6–10.2)
Chloride: 100 mmol/L (ref 96–106)
Creatinine, Ser: 0.83 mg/dL (ref 0.76–1.27)
GFR calc Af Amer: 105 mL/min/{1.73_m2} (ref >59)
GFR calc non Af Amer: 90 mL/min/{1.73_m2} (ref >59)
Glucose: 84 mg/dL (ref 65–99)
Potassium: 4.3 mmol/L (ref 3.5–5.2)
Sodium: 143 mmol/L (ref 134–144)

## 2019-06-27 ENCOUNTER — Other Ambulatory Visit: Payer: Self-pay | Admitting: General Practice

## 2019-06-28 DIAGNOSIS — J4550 Severe persistent asthma, uncomplicated: Secondary | ICD-10-CM | POA: Diagnosis not present

## 2019-06-29 ENCOUNTER — Ambulatory Visit (INDEPENDENT_AMBULATORY_CARE_PROVIDER_SITE_OTHER): Payer: Medicare Other | Admitting: *Deleted

## 2019-06-29 ENCOUNTER — Other Ambulatory Visit: Payer: Self-pay

## 2019-06-29 DIAGNOSIS — J4550 Severe persistent asthma, uncomplicated: Secondary | ICD-10-CM | POA: Diagnosis not present

## 2019-07-26 DIAGNOSIS — J4550 Severe persistent asthma, uncomplicated: Secondary | ICD-10-CM | POA: Diagnosis not present

## 2019-07-27 ENCOUNTER — Ambulatory Visit (INDEPENDENT_AMBULATORY_CARE_PROVIDER_SITE_OTHER): Payer: Medicare Other | Admitting: *Deleted

## 2019-07-27 ENCOUNTER — Other Ambulatory Visit: Payer: Self-pay

## 2019-07-27 DIAGNOSIS — J4550 Severe persistent asthma, uncomplicated: Secondary | ICD-10-CM | POA: Diagnosis not present

## 2019-08-21 ENCOUNTER — Other Ambulatory Visit: Payer: Self-pay | Admitting: Allergy and Immunology

## 2019-08-23 DIAGNOSIS — J4550 Severe persistent asthma, uncomplicated: Secondary | ICD-10-CM | POA: Diagnosis not present

## 2019-08-24 ENCOUNTER — Ambulatory Visit (INDEPENDENT_AMBULATORY_CARE_PROVIDER_SITE_OTHER): Payer: Medicare Other | Admitting: *Deleted

## 2019-08-24 DIAGNOSIS — J4550 Severe persistent asthma, uncomplicated: Secondary | ICD-10-CM | POA: Diagnosis not present

## 2019-08-25 ENCOUNTER — Other Ambulatory Visit: Payer: Self-pay | Admitting: Allergy and Immunology

## 2019-08-26 ENCOUNTER — Other Ambulatory Visit: Payer: Self-pay | Admitting: General Practice

## 2019-08-27 ENCOUNTER — Other Ambulatory Visit: Payer: Self-pay | Admitting: Internal Medicine

## 2019-09-20 DIAGNOSIS — J4550 Severe persistent asthma, uncomplicated: Secondary | ICD-10-CM | POA: Diagnosis not present

## 2019-09-21 ENCOUNTER — Ambulatory Visit (INDEPENDENT_AMBULATORY_CARE_PROVIDER_SITE_OTHER): Payer: Medicare Other | Admitting: *Deleted

## 2019-09-21 ENCOUNTER — Other Ambulatory Visit: Payer: Self-pay

## 2019-09-21 DIAGNOSIS — J4550 Severe persistent asthma, uncomplicated: Secondary | ICD-10-CM | POA: Diagnosis not present

## 2019-10-07 ENCOUNTER — Other Ambulatory Visit: Payer: Self-pay | Admitting: Allergy and Immunology

## 2019-10-18 DIAGNOSIS — J4550 Severe persistent asthma, uncomplicated: Secondary | ICD-10-CM

## 2019-10-19 ENCOUNTER — Ambulatory Visit (INDEPENDENT_AMBULATORY_CARE_PROVIDER_SITE_OTHER): Payer: Medicare Other

## 2019-10-19 ENCOUNTER — Other Ambulatory Visit: Payer: Self-pay

## 2019-10-19 DIAGNOSIS — J4550 Severe persistent asthma, uncomplicated: Secondary | ICD-10-CM

## 2019-10-19 MED ORDER — DULERA 200-5 MCG/ACT IN AERO: 2.0000 | INHALATION_SPRAY | Freq: Two times a day (BID) | RESPIRATORY_TRACT | 2 refills | Status: DC

## 2019-11-07 ENCOUNTER — Other Ambulatory Visit: Payer: Self-pay

## 2019-11-07 ENCOUNTER — Encounter: Payer: Self-pay | Admitting: Allergy & Immunology

## 2019-11-07 ENCOUNTER — Ambulatory Visit (INDEPENDENT_AMBULATORY_CARE_PROVIDER_SITE_OTHER): Payer: Medicare Other | Admitting: Allergy & Immunology

## 2019-11-07 VITALS — BP 130/98 | HR 69 | Temp 98.2°F | Resp 16 | Ht 75.0 in | Wt 218.4 lb

## 2019-11-07 DIAGNOSIS — J3089 Other allergic rhinitis: Secondary | ICD-10-CM

## 2019-11-07 DIAGNOSIS — K219 Gastro-esophageal reflux disease without esophagitis: Secondary | ICD-10-CM

## 2019-11-07 DIAGNOSIS — J4550 Severe persistent asthma, uncomplicated: Secondary | ICD-10-CM

## 2019-11-07 DIAGNOSIS — I2510 Atherosclerotic heart disease of native coronary artery without angina pectoris: Secondary | ICD-10-CM | POA: Diagnosis not present

## 2019-11-07 MED ORDER — BUDESONIDE-FORMOTEROL FUMARATE 160-4.5 MCG/ACT IN AERO: 2.0000 | INHALATION_SPRAY | Freq: Two times a day (BID) | RESPIRATORY_TRACT | 12 refills | Status: DC

## 2019-11-07 MED ORDER — IPRATROPIUM BROMIDE 0.06 % NA SOLN: 2.0000 | Freq: Four times a day (QID) | NASAL | 5 refills | Status: DC | PRN

## 2019-11-07 NOTE — Progress Notes (Signed)
FOLLOW UP  Date of Service/Encounter:  11/07/19   Assessment:   Severe persistent asthma, uncomplicated  Allergic rhinitis  LPRD (laryngopharyngeal reflux disease)  Plan/Recommendations:   1. Severe persistent asthma, uncomplicated - Lung testing was slightly lower today. - Start a prednisone burst today. - Stop the [facility] and start Symbicort 160/4.5 two puffs twice daily. - Spacer use reviewed. - Daily controller medication(s): Symbicort 160/4.32mcg two puffs twice daily with spacer + Nucala 100 mg monthly - Prior to physical activity: albuterol 2 puffs 10-15 minutes before physical activity. - Rescue medications: albuterol 4 puffs every 4-6 hours as needed - Asthma control goals:  * Full participation in all desired activities (may need albuterol before activity) * Albuterol use two time or less a week on average (not counting use with activity) * Cough interfering with sleep two time or less a month * Oral steroids no more than once a year * No hospitalizations  2. Allergic rhinitis - Start nasal ipratropium one spray per nostril up to four tines daily as needed for runny noses.   3. LPRD (laryngopharyngeal reflux disease) - Continue with the use of the omeprazole and Pepcid.  4. Return in about 6 months (around 05/06/2020). This can be an in-person, a virtual Webex or a telephone follow up visit.  Subjective:   Louis Perez is a 68 y.o. male presenting today for follow up of  Chief Complaint  Patient presents with  . Asthma    having issues with coughing, wheezing, having to tilt his bed at night because of the wheezing  . Shortness of Breath    also, is asking for a cheaper medication, dulera very expensive    Louis Perez has a history of the following: Patient Active Problem List   Diagnosis Date Noted  . Asthma, severe persistent, well-controlled 11/09/2018  . Other allergic rhinitis 11/09/2018  . LPRD (laryngopharyngeal reflux disease)  11/09/2018  . Stomatitis and mucositis 11/09/2018  . Pansinusitis 01/06/2017  . Hyperlipidemia LDL goal <70 07/23/2016  . Anginal chest pain at rest [facility]) 06/17/2016  . CAD in native artery 06/15/2016  . Stented coronary artery 06/15/2016  . Pain in the chest 05/28/2016  . Sinus bradycardia 05/15/2016  . Unstable angina ([place]) 05/14/2016  . CAD (coronary artery disease), native coronary artery 05/14/2016  . Dyslipidemia 09/28/2013  . GERD (gastroesophageal reflux disease) 09/28/2013  . 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 Moderate persistent chronic asthma with acute exacerbation 06/05/2013  . Essential hypertension 06/05/2013    History obtained from: chart review and patient.  Louis Perez is a 68 y.o. male presenting for a sick visit.  He was last seen in November 2019.  At that time, he was well controlled but having some issues with possible thrush.  He was switched to [facility] 200/5 mcg 2 puffs twice daily in lieu of the Symbicort.  He was continued on mepolizumab as well as Singulair.  For his laryngopharyngeal reflux disease, we continued omeprazole and started Pepcid.  He has a history of sensitizations to mold.  He was continued on an over-the-counter antihistamine.  Since last visit, he has done fairly well. He does report some worsening wheezing coughing over the last three weeks.   Asthma/Respiratory Symptom History: He remains on the [facility] two puffs twice daily. He continues to have some sores in his mouth. He was given a mouthwash that he uses twice daily. This has helped. The change to the [facility] did not seem to change the frequency of the sores.  He has been wheezing a lot at night over the last 2-3 weeks.   Allergic Rhinitis Symptom History: He does not use a nasal spray on a routine basis. He does not use an antihistamine regularly either. He does report a constant clear rhinorrhea, to the point where he brings a napkin with him wherever he goes. He does have fluticasone to use as needed  but he does not use this on a routine basis. He has never been on ipratropium nasal spray.     GERD Symptom History: He remains on his PPI and H2 blocker. He is good about taking these.   Otherwise, there have been no changes to his past medical history, surgical history, family history, or social history.    Review of Systems  Constitutional: Negative.  Negative for chills, fever, malaise/fatigue and weight loss.  HENT: Negative.  Negative for congestion, ear discharge and ear pain.   Eyes: Negative for pain, discharge and redness.  Respiratory: Positive for cough and wheezing. Negative for sputum production.   Cardiovascular: Negative.  Negative for chest pain and palpitations.  Gastrointestinal: Negative for abdominal pain, constipation, diarrhea, heartburn, nausea and vomiting.  Skin: Negative.  Negative for itching and rash.  Neurological: Negative for dizziness and headaches.  Endo/Heme/Allergies: Negative for environmental allergies. Does not bruise/bleed easily.       Objective:   Blood pressure (!) 130/98, pulse 69, temperature 98.2 F (36.8 C), temperature source Temporal, resp. rate 16, height 6\' 3"  (1.905 m), weight 218 lb 6.4 oz (99.1 kg), SpO2 94 %. Body mass index is 27.3 kg/m.   Physical Exam:  Physical Exam  Constitutional: He appears well-developed.  HENT:  Head: Normocephalic and atraumatic.  Right Ear: Tympanic membrane, external ear and ear canal normal.  Left Ear: Tympanic membrane and ear canal normal.  Nose: No mucosal edema, rhinorrhea, nasal deformity or septal deviation. No epistaxis. Right sinus exhibits no maxillary sinus tenderness and no frontal sinus tenderness. Left sinus exhibits no maxillary sinus tenderness and no frontal sinus tenderness.  Mouth/Throat: Uvula is midline and oropharynx is clear and moist. Mucous membranes are not pale and not dry.  Eyes: Pupils are equal, round, and reactive to light. Conjunctivae and EOM are normal. Right  eye exhibits no chemosis and no discharge. Left eye exhibits no chemosis and no discharge. Right conjunctiva is not injected. Left conjunctiva is not injected.  Cardiovascular: Normal rate, regular rhythm and normal heart sounds.  Respiratory: Effort normal and breath sounds normal. No accessory muscle usage. No tachypnea. No respiratory distress. He has no wheezes. He has no rhonchi. He has no rales. He exhibits no tenderness.  Diffuse wheezing in all lung fields. No increased work of breathing. There are no crackles noted. Speaking in full sentences.   Lymphadenopathy:    He has no cervical adenopathy.  Neurological: He is alert.  Skin: No abrasion, no petechiae and no rash noted. Rash is not papular, not vesicular and not urticarial. No erythema. No pallor.  No urticarial or eczematous lesions noted.   Psychiatric: He has a normal mood and affect.     Diagnostic studies:    Spirometry: results abnormal (FEV1: 2.05/51%, FVC: 2.92/54%, FEV1/FVC: 70%). His normal values are in the 60-65% range for both the FVC and FEV1.    Spirometry consistent with possible restrictive disease.    Allergy Studies: none     04-30-1983, MD  [facility]

## 2019-11-07 NOTE — Patient Instructions (Addendum)
1. Severe persistent asthma, uncomplicated - Lung testing was slightly lower today. - Start a prednisone burst today. - Stop the [facility] and start Symbicort 160/4.5 two puffs twice daily. - Spacer use reviewed. - Daily controller medication(s): Symbicort 160/4.91mcg two puffs twice daily with spacer + Nucala 100 mg monthly - Prior to physical activity: albuterol 2 puffs 10-15 minutes before physical activity. - Rescue medications: albuterol 4 puffs every 4-6 hours as needed - Asthma control goals:  * Full participation in all desired activities (may need albuterol before activity) * Albuterol use two time or less a week on average (not counting use with activity) * Cough interfering with sleep two time or less a month * Oral steroids no more than once a year * No hospitalizations  2. Allergic rhinitis - Start nasal ipratropium one spray per nostril up to four tines daily as needed for runny noses.   3. LPRD (laryngopharyngeal reflux disease) - Continue with the use of the omeprazole and Pepcid.  4. Return in about 6 months (around 05/06/2020). This can be an in-person, a virtual Webex or a telephone follow up visit.   Please inform us of any Emergency Department visits, hospitalizations, or changes in symptoms. Call us before going to the ED for breathing or allergy symptoms since we might be able to fit you in for a sick visit. Feel free to contact us anytime with any questions, problems, or concerns.  It was a pleasure to meet you today!  Websites that have reliable patient information: 1. American Academy of Asthma, Allergy, and Immunology: www.aaaai.org 2. Food Allergy Research and Education (FARE): foodallergy.org 3. Mothers of Asthmatics: http://www.asthmacommunitynetwork.org 4. American College of Allergy, Asthma, and Immunology: www.acaai.org  "Like" [place] on Facebook and Instagram for our latest updates!      Make sure you are registered to vote! If you have moved or changed  any of your contact information, you will need to get this updated before voting!  In some cases, you MAY be able to register to vote online: [email or web address]

## 2019-11-08 NOTE — Addendum Note (Signed)
Addended by: Neomia Dear on: 11/08/2019 10:05 AM   Modules accepted: Orders

## 2019-11-14 ENCOUNTER — Encounter: Payer: Self-pay | Admitting: Internal Medicine

## 2019-11-14 ENCOUNTER — Ambulatory Visit (INDEPENDENT_AMBULATORY_CARE_PROVIDER_SITE_OTHER): Payer: Medicare Other | Admitting: Internal Medicine

## 2019-11-14 ENCOUNTER — Other Ambulatory Visit: Payer: Self-pay

## 2019-11-14 VITALS — BP 147/104 | HR 64 | Ht 75.0 in | Wt 219.2 lb

## 2019-11-14 DIAGNOSIS — I2510 Atherosclerotic heart disease of native coronary artery without angina pectoris: Secondary | ICD-10-CM

## 2019-11-14 DIAGNOSIS — R0600 Dyspnea, unspecified: Secondary | ICD-10-CM | POA: Diagnosis not present

## 2019-11-14 DIAGNOSIS — R0989 Other specified symptoms and signs involving the circulatory and respiratory systems: Secondary | ICD-10-CM | POA: Diagnosis not present

## 2019-11-14 DIAGNOSIS — R0609 Other forms of dyspnea: Secondary | ICD-10-CM

## 2019-11-14 NOTE — Patient Instructions (Addendum)
Medication Instructions:  DECREASE YOUR OLMESARTAN TO 5 MG 1 IN THE MORNING AND 1 IN THE EVENING.   *If you need a refill on your cardiac medications before your next appointment, please call your pharmacy*  Lab Work: NONE  Testing/Procedures: Your physician has requested that you have a lexiscan myoview. For further information please visit [email or web address]. Please follow instruction sheet, as given. [place] [address]  Follow-Up: At [facility], you and your health needs are our priority.  As part of our continuing mission to provide you with exceptional heart care, we have created designated Provider Care Teams.  These Care Teams include your primary Cardiologist (physician) and Advanced Practice Providers (APPs -  Physician Assistants and Nurse Practitioners) who all work together to provide you with the care you need, when you need it.  Your next appointment:   AFTER YOUR TEST HAS BEEN COMPLETED    Other Instructions MONITOR AND LOG YOUR BLOOD PRESSURE, BRING READINGS TO FOLLOW UP

## 2019-11-14 NOTE — Progress Notes (Signed)
Cardiology Office Note   Date:  11/14/2019   ID:  Louis Perez, DOB [birth year], MRN [id number]  PCP:  [facility]  Cardiologist:   Louis Nose, MD   CC: Dyspnea on exertion, labile hypertension  History of Present Illness: Louis Perez is a 68 y.o. male who presents for evaluation of chest and back pain. Patient has a history of coronary disease with recent NSTEMI in March with DES stenting of the LAD.  He had non obstructive disease elsewhere.  EF was normal.    He was added to my schedule today for evaluation of chest and arm discomfort. This began on Sunday. It is similar to his previous unstable angina but not as severe. Still he describes the peak of his discomfort at 7 out of 10 in intensity. He gets across his chest. His left arm hurt. At times he has noticed some diaphoresis. It might last for only a minute. He has taken nitroglycerin and over the course of the last few days and it improves the symptoms. He describes it as somewhat heavy. He's not had any palpitations, presyncope or syncope. He's not describing any new shortness of breath, PND or orthopnea. He has brought the discomfort on a few times when he's been doing activities although this is been intermittent. He was actually able to do a little golfing yesterday without symptoms. However, prior to this he had discomfort with golfing building his children sandbox. This morning the pain was at rest. He still describes a little mid chest tightness.  Of note he has not had the symptoms since his stent.  I did review his catheterization. I looked at the images and he had a very high grade proximal LAD in March with nonobstructive disease in the right coronary.  05/28/2016  Louis Perez is a 68 year old patient who I last saw in 2014. He did not see me in follow-up since that time and is seen a number of other providers in our practice, ultimately leading to presentation with an  STEMI in March with a placement of a drug-eluting stent in the proximal LAD. Subsequent to that he had chest pain as described above and was referred back to the hospital for coronary evaluation. Catheterization indicated an 80% ostial ramus lesion at the area of partially jailed vessel due to the proximal LAD stent. It was felt that intervention could compromise the LAD therefore medical therapy was recommended. His isosorbide was doubled to 60 mg daily. Since then he reports he has had some improvement in chest discomfort but has significant headache.  06/15/2016  Louis Perez was seen back today in the office for chest pain. When I last saw him we had increased his isosorbide up to 60 mg daily and he's been on Ranexa thousand milligrams twice a day. He reports he continues to have episodes of chest discomfort which can occur at rest or with exertion. I've advised that he stop cardiac rehabilitation until we can determine whether or not his pain is related to coronary obstruction.  07/23/2016  Louis Perez returns for follow-up today. He underwent repeat cardiac catheterization by Dr. Excell Perez. This showed the following:  Conclusion    Prox RCA to Mid RCA lesion, 25% stenosed.  Ost LM lesion, 25% stenosed.  Ost Ramus lesion, 75% stenosed.  Mid LAD to Dist LAD lesion, 50% stenosed.  Ost 2nd Diag to 2nd Diag lesion, 30% stenosed.  1. Continued patency of the proximal LAD stent  2. Mild diffuse nonobstructive RCA stenosis with diffuse coronary ectasia 3. Patent left circumflex 4. Moderate-severe stenosis of the ramus intermedius with negative pressure wire analysis (FFR = 0.92 baseline and 0.85 at peak hyperemia)  Recommend: resume cardiac rehab, medical therapy   No intervention was performed. Since discharge she notes that he's had no further chest pain. He feels like the change in his isosorbide is been helpful. He is about to finish card agreeable dictation and will continue to exercise at  the [facility]. He's lost about 5-7 pounds and is well within the normal weight range at this time.  01/04/2017  Louis Perez returns today for follow-up. He reports that initially had some improvement with increasing his medical therapy for angina but still continues to have sharp, intermittent chest pains on a daily basis. This is typically in the right sternal area. He would not clearly say that his symptoms are progressively getting worse. Blood pressure is well-controlled today. His LDL cholesterol is 93 on 80 mg Lipitor. I advised him that his goal cholesterol should be much lower and that we may have difficulty reaching that since he is on a high potency statin. Options include adding ezetimibe or perhaps enrolling him in the Orion-10 trial. He did seem to be interested in the study medication. His last lipid profile was earlier this year and he will be due for repeat blood work. He is also had problems with asthma and has seen Dr. Sherene Perez for evaluation of this and to include cyclical shortness of breath.  03/22/2017  Louis Perez returns today for follow-up. He is doing fairly well denies any recurrent chest pain. Unfortunately has an upper respiratory infection and is had a flare of his asthma recently. Cholesterol is much better control with LDL now down to 64 on recent lab work with an LDL particle number less than 1000. ApoB and LPA level both at goal as well. It's now been over one year since his drug-eluting stent. He underwent a relook catheterization which showed a patent stent a couple months after his initial procedure for ongoing chest pain. He is also on Ranexa and isosorbide. He is interested in coming off some of his medicines.  08/02/2017  Louis Perez returns today for follow-up. He reports he occasionally gets some chest discomfort but its quick, sharp and atypical for coronary disease. He's found nothing to be limiting of his activities. He weaned himself off Ranexa by his request. He remains  on isosorbide 60 mg twice a day. He has not required short acting nitroglycerin. Overall he feels fairly well. His energy level is good. He is on atorvastatin 80 mg with an LDL C a less than 70. Blood pressure is at goal.  02/15/2018  Louis Perez returns today for follow-up.  He continues to get occasional sharp chest discomfort.  This is a similar complaint he had in August of last year.  He took himself off Ranexa and noticed no difference in his symptoms.  He is on isosorbide.  He said none of the episodes last long enough to take short acting nitro.  His wife is concerned only that he seems to mention it more frequently.  The episodes are not associated with exertion or relieved by rest.  EKG personally reviewed today shows normal sinus rhythm without ischemic changes at 64.  09/26/2018  Louis Perez is seen today in follow-up.  Over the past year he is done very well.  He is now walking up to about 20 miles a  week.  He denies any sharp chest pains or worsening shortness of breath.  He is off of his antianginal medications.  He had lipid testing this summer and LDL was 81.  He reports a pretty healthy diet however does have some intake of saturated fats that could be improved.  11/14/2019  Louis Perez is seen today in follow-up.  This is a routine visit however he has been having some symptoms.  Recently has had some worsening dyspnea on exertion.  He did see his pulmonologist and had medications adjusted for his asthma and was treated for possible upper respiratory infection.  He is also been struggling with sores in his mouth and was placed on a steroid rinse for that.  He is noted labile blood pressures recently.  His symptoms are worse with regards to dyspnea walking up hills but he denies any chest pain.  His last coronary evaluation was in 2017 at which time he had FFR of the ramus intermedius which was 0.85 noting a 75% ostial lesion.  This was not intervened upon.  He also had 50% mid to distal  LAD stenosis and some mild disease of the other coronaries.  He has been on medical therapy since then.  I am concerned that he is possibly had some progressive coronary disease.  Past Medical History:  Diagnosis Date  . Arthritis    "hips, lower back, knees, shoulders, hands" (05/14/2016)  . Asthma dx'd in 2014  . Bacterial septicemia (HCC) 01/2011   "both knee involved"  . CAD (coronary artery disease)    a. NSTEMI 02/2016 s/p DES to LAD. b. Relook cath due to angina/prior high risk anatomy 05/14/16 -> continued medical therapy given concern that a stent strut could inferere with Cx itself.  Marland Kitchen. GERD (gastroesophageal reflux disease)   . Gilbert's disease   . Hyperlipidemia   . Hypertension   . 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 NSTEMI (non-ST elevated myocardial infarction) (HCC) 02/2016   [name]/notes 03/02/2016  . Sinus bradycardia   . Unspecified tinnitus    right    Past Surgical History:  Procedure Laterality Date  . BACK SURGERY    . CARDIAC CATHETERIZATION N/A 03/01/2016   Procedure: Left Heart Cath and Coronary Angiography;  Surgeon: Tonny BollmanMichael Cooper, MD;  oLAD 95%, CFX/RI/RCA/RPDA 25-30%  . CARDIAC CATHETERIZATION N/A 03/01/2016   Procedure: Coronary Stent Intervention;  Surgeon: Tonny BollmanMichael Cooper, MD;  Promus Premier 4.0 x  12 mm DES to oLAD  . CARDIAC CATHETERIZATION N/A 05/14/2016   Procedure: Left Heart Cath and Coronary Angiography;  Surgeon: Lennette Biharihomas A Kelly, MD;  [facility];  Service: Cardiovascular;  Laterality: N/A;  . CARDIAC CATHETERIZATION N/A 06/17/2016   Procedure: Intravascular Pressure Wire/FFR Study;  Surgeon: Tonny BollmanMichael Cooper, MD;  [facility];  Service: Cardiovascular;  Laterality: N/A;  . CARDIOVASCULAR STRESS TEST  05/26/2012   EKG negative for ischemia, no ECG changes, no significant ischemia noted  . I&D KNEE WITH POLY EXCHANGE Bilateral 01/2011   & complete synovectomy of 3 compartments of the right total knee/notes 02/04/2011  (replacement,partial knee replacement because of bacterial infection)  . JOINT REPLACEMENT    . KNEE ARTHROSCOPY Right ~ 2002  . LUMBAR DISC SURGERY  ~ 1980; 2000  . NASAL SINUS SURGERY  08/2011  . SHOULDER ARTHROSCOPY W/ ROTATOR CUFF REPAIR Right 01/2015  . TOTAL KNEE ARTHROPLASTY Bilateral 03/26/2004-09/15/2004    right-left  . TRANSTHORACIC ECHOCARDIOGRAM  05/26/2012   EF >55%, mild  concentric LVH     Current Outpatient Medications  Medication Sig Dispense Refill  . albuterol (PROAIR HFA) 108 (90 Base) MCG/ACT inhaler Inhale 2 puffs into the lungs every 4 (four) hours as needed for wheezing or shortness of breath. 1 Inhaler 3  . aspirin 81 MG tablet Take 81 mg by mouth at bedtime.     Marland Kitchen atorvastatin (LIPITOR) 80 MG tablet TAKE 1 TABLET BY MOUTH ONCE DAILY 6 IN THE EVENING 90 tablet 0  . budesonide-formoterol (SYMBICORT) 160-4.5 MCG/ACT inhaler Inhale 2 puffs into the lungs 2 (two) times daily.    . cyproheptadine (PERIACTIN) 4 MG tablet Take 4 mg by mouth 2 (two) times daily.    Marland Kitchen dexamethasone (DECADRON) 0.5 MG/5ML solution Take 0.5 mg by mouth 4 (four) times daily.    . famotidine (PEPCID) 40 MG tablet Take 1 tablet (40 mg total) by mouth daily. 30 tablet 5  . ipratropium-albuterol (DUONEB) 0.5-2.5 (3) MG/3ML SOLN Take 3 mLs by nebulization every 6 (six) hours as needed. 120 mL 3  . isosorbide mononitrate (IMDUR) 60 MG 24 hr tablet Take 1 tablet (60 mg total) by mouth daily. 180 tablet 3  . mometasone-formoterol (DULERA) 200-5 MCG/ACT AERO Inhale 2 puffs into the lungs 2 (two) times daily. 13 g 2  . Multiple Vitamin (MULTIVITAMIN WITH MINERALS) TABS tablet Take 1 tablet by mouth daily.    . naproxen sodium (ALEVE) 220 MG tablet Take 220 mg by mouth daily as needed.    . nitroGLYCERIN (NITROSTAT) 0.4 MG SL tablet PLACE 1 TABLET UNDER TONGUE EVERY 5 MINUTES FOR UP TO 3 DOSES AS NEEDED FOR CHEST PAIN 25 tablet 3  . olmesartan (BENICAR) 5 MG tablet TAKE 2 TABLETS BY MOUTH EVERY DAY 180 tablet  1  . omeprazole (PRILOSEC) 40 MG capsule TAKE 1 CAPSULE BY MOUTH TWICE A DAY 180 capsule 3   Current Facility-Administered Medications  Medication Dose Route Frequency Provider Last Rate Last Dose  . Mepolizumab SOLR 100 mg  100 mg Subcutaneous Q28 days Jiles Prows, MD   100 mg at 10/19/19 6283    Allergies:   Oxycontin [oxycodone hcl], Percocet [oxycodone-acetaminophen], and Ambien [zolpidem tartrate]    Social History:  The patient  reports that he quit smoking about 50 years ago. His smoking use included cigarettes. He has a 5.00 pack-year smoking history. He has never used smokeless tobacco. He reports current alcohol use of about 1.0 standard drinks of alcohol per week. He reports that he does not use drugs.   Family History:  The patient's family history includes Alzheimer's disease in his father; Cancer in his paternal aunt, paternal uncle, paternal uncle, and paternal uncle; Dementia in his father; Heart disease in his mother; Heart disease (age of onset: 67) in his brother.    ROS:   Pertinent items noted in HPI and remainder of comprehensive ROS otherwise negative.  PHYSICAL EXAM: VS:  BP (!) 147/104   Pulse 64   Ht 6\' 3"  (1.905 m)   Wt 219 lb 3.2 oz (99.4 kg)   SpO2 94%   BMI 27.40 kg/m  , BMI Body mass index is 27.4 kg/m. General appearance: alert and no distress Neck: no carotid bruit, no JVD and thyroid not enlarged, symmetric, no tenderness/mass/nodules Lungs: clear to auscultation bilaterally Heart: regular rate and rhythm and S1, S2 normal Abdomen: soft, non-tender; bowel sounds normal; no masses,  no organomegaly Extremities: extremities normal, atraumatic, no cyanosis or edema Pulses: 2+ and symmetric Skin: Skin color, texture,  turgor normal. No rashes or lesions Neurologic: Grossly normal Psych: Pleasant  EKG:   Normal sinus rhythm at 64-personally reviewed  Recent Labs: 06/08/2019: BUN 19; Creatinine, Ser 0.83; Potassium 4.3; Sodium 143   Lipid  Panel    Component Value Date/Time   CHOL 152 11/02/2018 0937   CHOL 136 01/08/2017 0833   TRIG 128 11/02/2018 0937   TRIG 109 01/08/2017 0833   HDL 53 11/02/2018 0937   HDL 53 01/08/2017 0833   CHOLHDL 2.9 11/02/2018 0937   CHOLHDL 2.6 01/08/2017 0833   CHOLHDL 3.0 03/01/2016 0448   VLDL 17 03/01/2016 0448   LDLCALC 73 11/02/2018 0937   LDLCALC 64 01/08/2017 0833     Wt Readings from Last 3 Encounters:  11/14/19 219 lb 3.2 oz (99.4 kg)  11/07/19 218 lb 6.4 oz (99.1 kg)  06/08/19 213 lb (96.6 kg)    ASSESSMENT AND PLAN:  Patient Active Problem List   Diagnosis Date Noted  . Asthma, severe persistent, well-controlled 11/09/2018  . Other allergic rhinitis 11/09/2018  . LPRD (laryngopharyngeal reflux disease) 11/09/2018  . Stomatitis and mucositis 11/09/2018  . Pansinusitis 01/06/2017  . Hyperlipidemia LDL goal <70 07/23/2016  . Anginal chest pain at rest [facility]) 06/17/2016  . CAD in native artery 06/15/2016  . Stented coronary artery 06/15/2016  . Pain in the chest 05/28/2016  . Sinus bradycardia 05/15/2016  . Unstable angina (HCC) 05/14/2016  . CAD (coronary artery disease), native coronary artery 05/14/2016  . Dyslipidemia 09/28/2013  . GERD (gastroesophageal reflux disease) 09/28/2013  . 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 Moderate persistent chronic asthma with acute exacerbation 06/05/2013  . Essential hypertension 06/05/2013   PLAN: Louis Perez is describing some progressive dyspnea on exertion, particular walking up hills.  This is improved somewhat but not significantly after adjusting his asthma medications and treating him for possible URI.  He is also had some labile hypertension which could indicate ischemia.  I like for him to undergo repeat stress testing with a Lexiscan Myoview.  If there is significant reversible ischemia he will need repeat heart catheterization.  With regards to his blood pressures, since he has had low systolics around 100 and some systolic  pressures as high as 180, I am hesitant to increase medication as we may drive his blood pressure is too low.  Rather, I would recommend splitting up his Benicar to take in the morning and evening.  He should continue to track his blood pressures at home and will review those at follow-up.  Plan follow-up after his stress test or sooner as necessary.  Louis Nose, MD, [facility], FACP    [facility]  Medical Director of the Advanced Lipid Disorders &  Cardiovascular Risk Reduction Clinic Diplomate of the American Board of Clinical Lipidology Attending Cardiologist  Direct Dial: [phone]  Fax: [phone]  Website:  [email or web address]  Louis Nose, MD  11/14/2019 2:26 PM

## 2019-11-16 ENCOUNTER — Ambulatory Visit: Payer: Medicare Other

## 2019-11-16 ENCOUNTER — Ambulatory Visit: Payer: Self-pay

## 2019-11-16 DIAGNOSIS — J4550 Severe persistent asthma, uncomplicated: Secondary | ICD-10-CM | POA: Diagnosis not present

## 2019-11-17 ENCOUNTER — Other Ambulatory Visit: Payer: Self-pay

## 2019-11-17 ENCOUNTER — Telehealth (HOSPITAL_COMMUNITY): Payer: Self-pay | Admitting: *Deleted

## 2019-11-17 ENCOUNTER — Ambulatory Visit (INDEPENDENT_AMBULATORY_CARE_PROVIDER_SITE_OTHER): Payer: Medicare Other | Admitting: *Deleted

## 2019-11-17 DIAGNOSIS — J4550 Severe persistent asthma, uncomplicated: Secondary | ICD-10-CM

## 2019-11-17 NOTE — Telephone Encounter (Signed)
Patient given detailed instructions per Myocardial Perfusion Study Information Sheet for the test on 11/20/19 at 8:00. Patient notified to arrive 15 minutes early and that it is imperative to arrive on time for appointment to keep from having the test rescheduled.  If you need to cancel or reschedule your appointment, please call the office within 24 hours of your appointment. . Patient verbalized understanding.Louis Perez

## 2019-11-20 ENCOUNTER — Ambulatory Visit (HOSPITAL_COMMUNITY): Payer: Medicare Other | Attending: Cardiology

## 2019-11-20 ENCOUNTER — Other Ambulatory Visit: Payer: Self-pay

## 2019-11-20 DIAGNOSIS — R0609 Other forms of dyspnea: Secondary | ICD-10-CM

## 2019-11-20 DIAGNOSIS — I2510 Atherosclerotic heart disease of native coronary artery without angina pectoris: Secondary | ICD-10-CM

## 2019-11-20 DIAGNOSIS — R0600 Dyspnea, unspecified: Secondary | ICD-10-CM | POA: Diagnosis not present

## 2019-11-20 LAB — MYOCARDIAL PERFUSION IMAGING
LV dias vol: 72 mL (ref 62–150)
LV sys vol: 23 mL
Peak HR: 88 {beats}/min
Rest HR: 61 {beats}/min
SDS: 0
SRS: 0
SSS: 0
TID: 0.88

## 2019-11-20 MED ORDER — TECHNETIUM TC 99M TETROFOSMIN IV KIT
10.7000 | PACK | Freq: Once | INTRAVENOUS | Status: AC | PRN
  Administered 2019-11-20: 10.7 via INTRAVENOUS
  Filled 2019-11-20: qty 11

## 2019-11-20 MED ORDER — REGADENOSON 0.4 MG/5ML IV SOLN
0.4000 mg | Freq: Once | INTRAVENOUS | Status: AC
  Administered 2019-11-20: 0.4 mg via INTRAVENOUS

## 2019-11-20 MED ORDER — TECHNETIUM TC 99M TETROFOSMIN IV KIT
31.6000 | PACK | Freq: Once | INTRAVENOUS | Status: AC | PRN
  Administered 2019-11-20: 31.6 via INTRAVENOUS
  Filled 2019-11-20: qty 32

## 2019-11-28 ENCOUNTER — Ambulatory Visit (INDEPENDENT_AMBULATORY_CARE_PROVIDER_SITE_OTHER): Payer: Medicare Other | Admitting: Internal Medicine

## 2019-11-28 ENCOUNTER — Other Ambulatory Visit: Payer: Self-pay

## 2019-11-28 ENCOUNTER — Encounter: Payer: Self-pay | Admitting: Internal Medicine

## 2019-11-28 VITALS — BP 138/97 | HR 74 | Temp 97.3°F | Ht 75.0 in | Wt 219.0 lb

## 2019-11-28 DIAGNOSIS — E785 Hyperlipidemia, unspecified: Secondary | ICD-10-CM | POA: Diagnosis not present

## 2019-11-28 DIAGNOSIS — I2510 Atherosclerotic heart disease of native coronary artery without angina pectoris: Secondary | ICD-10-CM | POA: Diagnosis not present

## 2019-11-28 DIAGNOSIS — R0989 Other specified symptoms and signs involving the circulatory and respiratory systems: Secondary | ICD-10-CM

## 2019-11-28 NOTE — Progress Notes (Signed)
Cardiology Office Note   Date:  11/28/2019   ID:  ALEXSANDRO SALEK, DOB [birth year], MRN [id number]  PCP:  [facility]  Cardiologist:   Pixie Casino, MD   CC: Follow-up stress test  History of Present Illness: Louis Perez is a 68 y.o. male who presents for evaluation of chest and back pain. Patient has a history of coronary disease with recent NSTEMI in March with DES stenting of the LAD.  He had non obstructive disease elsewhere.  EF was normal.    He was added to my schedule today for evaluation of chest and arm discomfort. This began on Sunday. It is similar to his previous unstable angina but not as severe. Still he describes the peak of his discomfort at 7 out of 10 in intensity. He gets across his chest. His left arm hurt. At times he has noticed some diaphoresis. It might last for only a minute. He has taken nitroglycerin and over the course of the last few days and it improves the symptoms. He describes it as somewhat heavy. He's not had any palpitations, presyncope or syncope. He's not describing any new shortness of breath, PND or orthopnea. He has brought the discomfort on a few times when he's been doing activities although this is been intermittent. He was actually able to do a little golfing yesterday without symptoms. However, prior to this he had discomfort with golfing building his children sandbox. This morning the pain was at rest. He still describes a little mid chest tightness.  Of note he has not had the symptoms since his stent.  I did review his catheterization. I looked at the images and he had a very high grade proximal LAD in March with nonobstructive disease in the right coronary.  05/28/2016  Mr. Strollo is a 68 year old patient who I last saw in 2014. He did not see me in follow-up since that time and is seen a number of other providers in our practice, ultimately leading to presentation with an STEMI in March with a  placement of a drug-eluting stent in the proximal LAD. Subsequent to that he had chest pain as described above and was referred back to the hospital for coronary evaluation. Catheterization indicated an 80% ostial ramus lesion at the area of partially jailed vessel due to the proximal LAD stent. It was felt that intervention could compromise the LAD therefore medical therapy was recommended. His isosorbide was doubled to 60 mg daily. Since then he reports he has had some improvement in chest discomfort but has significant headache.  06/15/2016  Mr. Bail was seen back today in the office for chest pain. When I last saw him we had increased his isosorbide up to 60 mg daily and he's been on Ranexa thousand milligrams twice a day. He reports he continues to have episodes of chest discomfort which can occur at rest or with exertion. I've advised that he stop cardiac rehabilitation until we can determine whether or not his pain is related to coronary obstruction.  07/23/2016  Mr. Rhinesmith returns for follow-up today. He underwent repeat cardiac catheterization by Dr. Burt Knack. This showed the following:  Conclusion    Prox RCA to Mid RCA lesion, 25% stenosed.  Ost LM lesion, 25% stenosed.  Ost Ramus lesion, 75% stenosed.  Mid LAD to Dist LAD lesion, 50% stenosed.  Ost 2nd Diag to 2nd Diag lesion, 30% stenosed.  1. Continued patency of the proximal LAD stent 2. Mild  diffuse nonobstructive RCA stenosis with diffuse coronary ectasia 3. Patent left circumflex 4. Moderate-severe stenosis of the ramus intermedius with negative pressure wire analysis (FFR = 0.92 baseline and 0.85 at peak hyperemia)  Recommend: resume cardiac rehab, medical therapy   No intervention was performed. Since discharge she notes that he's had no further chest pain. He feels like the change in his isosorbide is been helpful. He is about to finish card agreeable dictation and will continue to exercise at the [facility]. He's lost  about 5-7 pounds and is well within the normal weight range at this time.  01/04/2017  [name] returns today for follow-up. He reports that initially had some improvement with increasing his medical therapy for angina but still continues to have sharp, intermittent chest pains on a daily basis. This is typically in the right sternal area. He would not clearly say that his symptoms are progressively getting worse. Blood pressure is well-controlled today. His LDL cholesterol is 93 on 80 mg Lipitor. I advised him that his goal cholesterol should be much lower and that we may have difficulty reaching that since he is on a high potency statin. Options include adding ezetimibe or perhaps enrolling him in the Orion-10 trial. He did seem to be interested in the study medication. His last lipid profile was earlier this year and he will be due for repeat blood work. He is also had problems with asthma and has seen Dr. Sherene Sires for evaluation of this and to include cyclical shortness of breath.  03/22/2017  [name] returns today for follow-up. He is doing fairly well denies any recurrent chest pain. Unfortunately has an upper respiratory infection and is had a flare of his asthma recently. Cholesterol is much better control with LDL now down to 64 on recent lab work with an LDL particle number less than 1000. ApoB and LPA level both at goal as well. It's now been over one year since his drug-eluting stent. He underwent a relook catheterization which showed a patent stent a couple months after his initial procedure for ongoing chest pain. He is also on Ranexa and isosorbide. He is interested in coming off some of his medicines.  08/02/2017  [name] returns today for follow-up. He reports he occasionally gets some chest discomfort but its quick, sharp and atypical for coronary disease. He's found nothing to be limiting of his activities. He weaned himself off Ranexa by his request. He remains on isosorbide 60 mg  twice a day. He has not required short acting nitroglycerin. Overall he feels fairly well. His energy level is good. He is on atorvastatin 80 mg with an LDL C a less than 70. Blood pressure is at goal.  02/15/2018  Mr. Pulliam returns today for follow-up.  He continues to get occasional sharp chest discomfort.  This is a similar complaint he had in August of last year.  He took himself off Ranexa and noticed no difference in his symptoms.  He is on isosorbide.  He said none of the episodes last long enough to take short acting nitro.  His wife is concerned only that he seems to mention it more frequently.  The episodes are not associated with exertion or relieved by rest.  EKG personally reviewed today shows normal sinus rhythm without ischemic changes at 64.  09/26/2018  Mr. Kemmerling is seen today in follow-up.  Over the past year he is done very well.  He is now walking up to about 20 miles a week.  He denies any sharp chest pains or worsening shortness of breath.  He is off of his antianginal medications.  He had lipid testing this summer and LDL was 81.  He reports a pretty healthy diet however does have some intake of saturated fats that could be improved.  11/14/2019  Mr. Hassebrock is seen today in follow-up.  This is a routine visit however he has been having some symptoms.  Recently has had some worsening dyspnea on exertion.  He did see his pulmonologist and had medications adjusted for his asthma and was treated for possible upper respiratory infection.  He is also been struggling with sores in his mouth and was placed on a steroid rinse for that.  He is noted labile blood pressures recently.  His symptoms are worse with regards to dyspnea walking up hills but he denies any chest pain.  His last coronary evaluation was in 2017 at which time he had FFR of the ramus intermedius which was 0.85 noting a 75% ostial lesion.  This was not intervened upon.  He also had 50% mid to distal LAD stenosis and some  mild disease of the other coronaries.  He has been on medical therapy since then.  I am concerned that he is possibly had some progressive coronary disease.  11/28/2019  Mr. Clyne returns today for follow-up.  He underwent stress testing which was negative for ischemia and demonstrated normal LV function.  After adjusting his medications his blood pressure is now better controlled.  He says the shortness of breath has improved as well.  Past Medical History:  Diagnosis Date  . Arthritis    "hips, lower back, knees, shoulders, hands" (05/14/2016)  . Asthma dx'd in 2014  . Bacterial septicemia (HCC) 01/2011   "both knee involved"  . CAD (coronary artery disease)    a. NSTEMI 02/2016 s/p DES to LAD. b. Relook cath due to angina/prior high risk anatomy 05/14/16 -> continued medical therapy given concern that a stent strut could inferere with Cx itself.  Marland Kitchen GERD (gastroesophageal reflux disease)   . Gilbert's disease   . Hyperlipidemia   . Hypertension   . 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 NSTEMI (non-ST elevated myocardial infarction) (HCC) 02/2016   [name] 03/02/2016  . Sinus bradycardia   . Unspecified tinnitus    right    Past Surgical History:  Procedure Laterality Date  . BACK SURGERY    . CARDIAC CATHETERIZATION N/A 03/01/2016   Procedure: Left Heart Cath and Coronary Angiography;  Surgeon: Tonny Bollman, MD;  oLAD 95%, CFX/RI/RCA/RPDA 25-30%  . CARDIAC CATHETERIZATION N/A 03/01/2016   Procedure: Coronary Stent Intervention;  Surgeon: Tonny Bollman, MD;  Promus Premier 4.0 x  12 mm DES to oLAD  . CARDIAC CATHETERIZATION N/A 05/14/2016   Procedure: Left Heart Cath and Coronary Angiography;  Surgeon: Lennette Bihari, MD;  Location: [facility] INVASIVE CV LAB;  Service: Cardiovascular;  Laterality: N/A;  . CARDIAC CATHETERIZATION N/A 06/17/2016   Procedure: Intravascular Pressure Wire/FFR Study;  Surgeon: Tonny Bollman, MD;  Location: [facility] INVASIVE CV LAB;  Service: Cardiovascular;   Laterality: N/A;  . CARDIOVASCULAR STRESS TEST  05/26/2012   EKG negative for ischemia, no ECG changes, no significant ischemia noted  . I&D KNEE WITH POLY EXCHANGE Bilateral 01/2011   & complete synovectomy of 3 compartments of the right total knee/notes 02/04/2011 (replacement,partial knee replacement because of bacterial infection)  . JOINT REPLACEMENT    . KNEE ARTHROSCOPY Right ~ 2002  . LUMBAR  DISC SURGERY  ~ 1980; 2000  . NASAL SINUS SURGERY  08/2011  . SHOULDER ARTHROSCOPY W/ ROTATOR CUFF REPAIR Right 01/2015  . TOTAL KNEE ARTHROPLASTY Bilateral 03/26/2004-09/15/2004    right-left  . TRANSTHORACIC ECHOCARDIOGRAM  05/26/2012   EF >55%, mild concentric LVH     Current Outpatient Medications  Medication Sig Dispense Refill  . albuterol (PROAIR HFA) 108 (90 Base) MCG/ACT inhaler Inhale 2 puffs into the lungs every 4 (four) hours as needed for wheezing or shortness of breath. 1 Inhaler 3  . aspirin 81 MG tablet Take 81 mg by mouth at bedtime.     Marland Kitchen. atorvastatin (LIPITOR) 80 MG tablet TAKE 1 TABLET BY MOUTH ONCE DAILY 6 IN THE EVENING 90 tablet 0  . budesonide-formoterol (SYMBICORT) 160-4.5 MCG/ACT inhaler Inhale 2 puffs into the lungs 2 (two) times daily.    . cyproheptadine (PERIACTIN) 4 MG tablet Take 4 mg by mouth 2 (two) times daily.    Marland Kitchen. dexamethasone (DECADRON) 0.5 MG/5ML solution Take 0.5 mg by mouth 4 (four) times daily.    . famotidine (PEPCID) 40 MG tablet Take 1 tablet (40 mg total) by mouth daily. 30 tablet 5  . ipratropium-albuterol (DUONEB) 0.5-2.5 (3) MG/3ML SOLN Take 3 mLs by nebulization every 6 (six) hours as needed. 120 mL 3  . isosorbide mononitrate (IMDUR) 60 MG 24 hr tablet Take 1 tablet (60 mg total) by mouth daily. 180 tablet 3  . mometasone-formoterol (DULERA) 200-5 MCG/ACT AERO Inhale 2 puffs into the lungs 2 (two) times daily. 13 g 2  . Multiple Vitamin (MULTIVITAMIN WITH MINERALS) TABS tablet Take 1 tablet by mouth daily.    . naproxen sodium (ALEVE) 220 MG tablet  Take 220 mg by mouth daily as needed.    . nitroGLYCERIN (NITROSTAT) 0.4 MG SL tablet PLACE 1 TABLET UNDER TONGUE EVERY 5 MINUTES FOR UP TO 3 DOSES AS NEEDED FOR CHEST PAIN 25 tablet 3  . olmesartan (BENICAR) 5 MG tablet Take 5 mg by mouth 2 (two) times daily.    Marland Kitchen. omeprazole (PRILOSEC) 40 MG capsule TAKE 1 CAPSULE BY MOUTH TWICE A DAY 180 capsule 3   Current Facility-Administered Medications  Medication Dose Route Frequency Provider Last Rate Last Admin  . Mepolizumab SOLR 100 mg  100 mg Subcutaneous Q28 days Jessica PriestKozlow, Eric J, MD   100 mg at 11/17/19 1429    Allergies:   Oxycontin [oxycodone hcl], Percocet [oxycodone-acetaminophen], and Ambien [zolpidem tartrate]    Social History:  The patient  reports that he quit smoking about 50 years ago. His smoking use included cigarettes. He has a 5.00 pack-year smoking history. He has never used smokeless tobacco. He reports current alcohol use of about 1.0 standard drinks of alcohol per week. He reports that he does not use drugs.   Family History:  The patient's family history includes Alzheimer's disease in his father; Cancer in his paternal aunt, paternal uncle, paternal uncle, and paternal uncle; Dementia in his father; Heart disease in his mother; Heart disease (age of onset: 9060) in his brother.    ROS:   Pertinent items noted in HPI and remainder of comprehensive ROS otherwise negative.  PHYSICAL EXAM: VS:  BP (!) 138/97   Pulse 74   Temp (!) 97.3 F (36.3 C)   Ht 6\' 3"  (1.905 m)   Wt 219 lb (99.3 kg)   SpO2 95%   BMI 27.37 kg/m  , BMI Body mass index is 27.37 kg/m. Deferred  EKG:   Deferred  Recent  Labs: 06/08/2019: BUN 19; Creatinine, Ser 0.83; Potassium 4.3; Sodium 143   Lipid Panel    Component Value Date/Time   CHOL 152 11/02/2018 0937   CHOL 136 01/08/2017 0833   TRIG 128 11/02/2018 0937   TRIG 109 01/08/2017 0833   HDL 53 11/02/2018 0937   HDL 53 01/08/2017 0833   CHOLHDL 2.9 11/02/2018 0937   CHOLHDL 2.6  01/08/2017 0833   CHOLHDL 3.0 03/01/2016 0448   VLDL 17 03/01/2016 0448   LDLCALC 73 11/02/2018 0937   LDLCALC 64 01/08/2017 0833     Wt Readings from Last 3 Encounters:  11/28/19 219 lb (99.3 kg)  11/20/19 219 lb (99.3 kg)  11/14/19 219 lb 3.2 oz (99.4 kg)    ASSESSMENT AND PLAN:  Patient Active Problem List   Diagnosis Date Noted  . Asthma, severe persistent, well-controlled 11/09/2018  . Other allergic rhinitis 11/09/2018  . LPRD (laryngopharyngeal reflux disease) 11/09/2018  . Stomatitis and mucositis 11/09/2018  . Pansinusitis 01/06/2017  . Hyperlipidemia LDL goal <70 07/23/2016  . Anginal chest pain at rest [facility]) 06/17/2016  . CAD in native artery 06/15/2016  . Stented coronary artery 06/15/2016  . Pain in the chest 05/28/2016  . Sinus bradycardia 05/15/2016  . Unstable angina (HCC) 05/14/2016  . CAD (coronary artery disease), native coronary artery 05/14/2016  . Dyslipidemia 09/28/2013  . GERD (gastroesophageal reflux disease) 09/28/2013  . 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 Moderate persistent chronic asthma with acute exacerbation 06/05/2013  . Essential hypertension 06/05/2013   PLAN: Mr. Bekker had a low risk Myoview stress test and his shortness of breath is improved.  His blood pressure control is better now with staggering his medication doses with better coverage throughout the night.  Overall he is pleased.  Plan follow-up after his stress test or sooner as necessary.  Chrystie Nose, MD, [facility], FACP  [place]  [facility]  Medical Director of the Advanced Lipid Disorders &  Cardiovascular Risk Reduction Clinic Diplomate of the American Board of Clinical Lipidology Attending Cardiologist  Direct Dial: [phone]  Fax: [phone]  Website:  [email or web address]  Chrystie Nose, MD  11/28/2019 3:30 PM

## 2019-11-28 NOTE — Patient Instructions (Signed)
Medication Instructions:  Your physician recommends that you continue on your current medications as directed. Please refer to the Current Medication list given to you today.  *If you need a refill on your cardiac medications before your next appointment, please call your pharmacy*  Follow-Up: At [facility], you and your health needs are our priority.  As part of our continuing mission to provide you with exceptional heart care, we have created designated Provider Care Teams.  These Care Teams include your primary Cardiologist (physician) and Advanced Practice Providers (APPs -  Physician Assistants and Nurse Practitioners) who all work together to provide you with the care you need, when you need it.  Your next appointment:   12 month(s)  The format for your next appointment:   Either In Person or Virtual  Provider:   You may see [name] [name], MD or one of the following Advanced Practice Providers on your designated Care Team:    Hao Meng, PA-C  Angela Duke, PA-C or   Krista Kroeger, PA-C   Other Instructions   

## 2019-12-03 ENCOUNTER — Other Ambulatory Visit: Payer: Self-pay | Admitting: Allergy

## 2019-12-03 ENCOUNTER — Other Ambulatory Visit: Payer: Self-pay | Admitting: Internal Medicine

## 2019-12-13 DIAGNOSIS — J4550 Severe persistent asthma, uncomplicated: Secondary | ICD-10-CM | POA: Diagnosis not present

## 2019-12-14 ENCOUNTER — Other Ambulatory Visit: Payer: Self-pay

## 2019-12-14 ENCOUNTER — Ambulatory Visit (INDEPENDENT_AMBULATORY_CARE_PROVIDER_SITE_OTHER): Payer: Medicare Other

## 2019-12-14 DIAGNOSIS — J4550 Severe persistent asthma, uncomplicated: Secondary | ICD-10-CM

## 2020-01-10 DIAGNOSIS — J4550 Severe persistent asthma, uncomplicated: Secondary | ICD-10-CM | POA: Diagnosis not present

## 2020-01-11 ENCOUNTER — Ambulatory Visit (INDEPENDENT_AMBULATORY_CARE_PROVIDER_SITE_OTHER): Payer: Medicare Other

## 2020-01-11 ENCOUNTER — Other Ambulatory Visit: Payer: Self-pay

## 2020-01-11 DIAGNOSIS — J4550 Severe persistent asthma, uncomplicated: Secondary | ICD-10-CM

## 2020-01-14 ENCOUNTER — Other Ambulatory Visit: Payer: Self-pay | Admitting: Allergy and Immunology

## 2020-01-22 ENCOUNTER — Other Ambulatory Visit: Payer: Self-pay | Admitting: Allergy and Immunology

## 2020-01-22 NOTE — Telephone Encounter (Signed)
Is this a medication that you would like for the patient to be prescribed?

## 2020-01-23 NOTE — Telephone Encounter (Signed)
It looks like Dr. Lucie Leather prescribed it in the past. It is an antihistamine, so I am fine prescribing it for him.  Malachi Bonds, MD [facility]

## 2020-01-28 ENCOUNTER — Other Ambulatory Visit: Payer: Self-pay | Admitting: Internal Medicine

## 2020-02-07 DIAGNOSIS — J4550 Severe persistent asthma, uncomplicated: Secondary | ICD-10-CM | POA: Diagnosis not present

## 2020-02-08 ENCOUNTER — Other Ambulatory Visit: Payer: Self-pay

## 2020-02-08 ENCOUNTER — Ambulatory Visit (INDEPENDENT_AMBULATORY_CARE_PROVIDER_SITE_OTHER): Payer: Medicare Other

## 2020-02-08 DIAGNOSIS — J4550 Severe persistent asthma, uncomplicated: Secondary | ICD-10-CM

## 2020-03-03 ENCOUNTER — Other Ambulatory Visit: Payer: Self-pay | Admitting: General Practice

## 2020-03-03 ENCOUNTER — Other Ambulatory Visit: Payer: Self-pay | Admitting: Allergy & Immunology

## 2020-03-10 ENCOUNTER — Other Ambulatory Visit: Payer: Self-pay | Admitting: Internal Medicine

## 2020-03-13 DIAGNOSIS — J4550 Severe persistent asthma, uncomplicated: Secondary | ICD-10-CM | POA: Diagnosis not present

## 2020-03-14 ENCOUNTER — Telehealth: Payer: Self-pay

## 2020-03-14 ENCOUNTER — Other Ambulatory Visit: Payer: Self-pay

## 2020-03-14 ENCOUNTER — Ambulatory Visit (INDEPENDENT_AMBULATORY_CARE_PROVIDER_SITE_OTHER): Payer: Medicare Other

## 2020-03-14 DIAGNOSIS — J4550 Severe persistent asthma, uncomplicated: Secondary | ICD-10-CM

## 2020-03-14 MED ORDER — CYPROHEPTADINE HCL 4 MG PO TABS: ORAL_TABLET | ORAL | 1 refills | Status: DC

## 2020-03-14 NOTE — Telephone Encounter (Signed)
Patient came in today to get his Nucala shot and mentioned about his medication cyproheptadine 4 MG  that needs to be authorized by physician for refills.

## 2020-03-14 NOTE — Telephone Encounter (Signed)
Sent in refill for cyproheptadine 4mg  to pharmacy per Dr. ..  Left message on patient voice mail.

## 2020-03-14 NOTE — Telephone Encounter (Signed)
That is fine to refill that medication for three months. He is supposed to see me again in May 2021.  Malachi Bonds, MD [facility]   Malachi Bonds, MD [facility]

## 2020-03-14 NOTE — Addendum Note (Signed)
Addended byClyda Greener M on: 03/14/2020 04:48 PM   Modules accepted: Orders

## 2020-03-27 ENCOUNTER — Telehealth: Payer: Self-pay | Admitting: Internal Medicine

## 2020-03-27 NOTE — Telephone Encounter (Signed)
  Pt c/o of Chest Pain: STAT if CP now or developed within 24 hours  1. Are you having CP right now? no  2. Are you experiencing any other symptoms (ex. SOB, nausea, vomiting, sweating)? Nausea and SOB earlier during CP  3. How long have you been experiencing CP? Started 1:30pm today  4. Is your CP continuous or coming and going? Came and went  5. Have you taken Nitroglycerin? Yes    Patient states at around 1:30pm today he had a sharp pain in his chest and arm. He states he has had nausea and SOB. He states he took a nitroglycerin and it went away.

## 2020-03-27 NOTE — Telephone Encounter (Signed)
Spoke with pt who report today around 1:30 pm he experienced sharp chest pain with some nausea and SOB. Pt denies symptoms at the moment but report pain was relieved with 1 nitro. Appointment scheduled for tomorrow 4/15 at 11:15 am for further evaluations. Pt advised to report to ER if symptom reoccur. Pt verbalized understanding.

## 2020-03-28 ENCOUNTER — Ambulatory Visit (INDEPENDENT_AMBULATORY_CARE_PROVIDER_SITE_OTHER): Payer: Medicare Other | Admitting: Physician Assistant

## 2020-03-28 ENCOUNTER — Other Ambulatory Visit: Payer: Self-pay

## 2020-03-28 ENCOUNTER — Encounter: Payer: Self-pay | Admitting: Physician Assistant

## 2020-03-28 VITALS — BP 120/86 | HR 63 | Temp 97.3°F | Ht 75.0 in | Wt 222.0 lb

## 2020-03-28 DIAGNOSIS — I10 Essential (primary) hypertension: Secondary | ICD-10-CM

## 2020-03-28 DIAGNOSIS — I2510 Atherosclerotic heart disease of native coronary artery without angina pectoris: Secondary | ICD-10-CM | POA: Diagnosis not present

## 2020-03-28 DIAGNOSIS — E785 Hyperlipidemia, unspecified: Secondary | ICD-10-CM

## 2020-03-28 DIAGNOSIS — R079 Chest pain, unspecified: Secondary | ICD-10-CM | POA: Diagnosis not present

## 2020-03-28 MED ORDER — OLMESARTAN MEDOXOMIL 5 MG PO TABS: 5.0000 mg | ORAL_TABLET | Freq: Every day | ORAL | 3 refills | Status: DC

## 2020-03-28 MED ORDER — CARVEDILOL 3.125 MG PO TABS: 3.1250 mg | ORAL_TABLET | Freq: Two times a day (BID) | ORAL | 3 refills | Status: DC

## 2020-03-28 NOTE — Progress Notes (Signed)
Cardiology Office Note:    Date:  03/30/2020   ID:  Louis Perez, DOB 09-15-1951, MRN [id number]  PCP:  [facility]  Cardiologist:  Chrystie Nose, MD  Electrophysiologist:  None   Referring MD: Roe Coombs*   Chief Complaint  Patient presents with  . Follow-up    History of Present Illness:    Louis Perez is a 69 y.o. male with a hx of HTN, HLD, Gilbert's disease, and CAD.  Cardiac catheterization performed on 03/01/2016 showed 95% ostial LAD lesion treated with DES, 25% mid RCA, 30% distal RCA, 30% ostial RPDA lesion, 50% distal LAD lesion.  Repeat cardiac catheterization in June 2017 revealed 80% ramus lesion, 20% distal RCA lesion.  Due to recurrent symptoms, he underwent another cardiac catheterization in July 2017 that showed 75% ostial ramus lesion was FFR 0.92, 50% mid to distal LAD lesion, 30% ostial D2 lesion, 25% ostial left main, and 25% proximal RCA lesion.  Medical therapy was recommended.  He was previously on Ranexa and but he weaned himself off of Ranexa in 2018.  He remains on Imdur due to chronic intermittent chest pain.  Most recent Myoview was performed on 11/20/2019 which showed EF 69%, overall low risk study.  Patient presents today accompanied by his wife.  Yesterday, while raking some leaves, he had a 5-minute onset of right-sided chest pain radiating down the right shoulder.  This is accompanied by nausea and shortness of breath.  He denies any diaphoresis or dizziness.  Despite the short duration of the chest pain, he says that this is the worst he felt since the previous heart attack in 2017.  Characteristic is similar to the previous angina, however symptoms resolved with only one sublingual nitroglycerin.  Although he had a some mild residual discomfort in the area afterward, it has completely resolved by this point.  I discussed the case with DOD Dr. Milus Mallick, since he just had a stress test in December 2020,  repeating a stress stress test may not be very helpful.  I recommended echocardiogram.  We discussed 2 options with the patient, either medical therapy versus going straight for cardiac catheterization, he opted for medical therapy for the time being.  I added 3.125 mg twice daily of carvedilol to his medical regimen.  He will decrease olmesartan to 5 mg daily.  I will bring him back in 1 to 2 weeks for reassessment and if chest discomfort recurs, he will need a cardiac catheterization.  Past Medical History:  Diagnosis Date  . Arthritis    "hips, lower back, knees, shoulders, hands" (05/14/2016)  . Asthma dx'd in 2014  . Bacterial septicemia (HCC) 01/2011   "both knee involved"  . CAD (coronary artery disease)    a. NSTEMI 02/2016 s/p DES to LAD. b. Relook cath due to angina/prior high risk anatomy 05/14/16 -> continued medical therapy given concern that a stent strut could inferere with Cx itself.  Marland Kitchen GERD (gastroesophageal reflux disease)   . Gilbert's disease   . Hyperlipidemia   . Hypertension   . 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 NSTEMI (non-ST elevated myocardial infarction) (HCC) 02/2016   [name] 03/02/2016  . Sinus bradycardia   . Unspecified tinnitus    right    Past Surgical History:  Procedure Laterality Date  . BACK SURGERY    . CARDIAC CATHETERIZATION N/A 03/01/2016   Procedure: Left Heart Cath and Coronary Angiography;  Surgeon: Tonny Bollman, MD;  oLAD 95%,  CFX/RI/RCA/RPDA 25-30%  . CARDIAC CATHETERIZATION N/A 03/01/2016   Procedure: Coronary Stent Intervention;  Surgeon: Tonny Bollman, MD;  Promus Premier 4.0 x  12 mm DES to oLAD  . CARDIAC CATHETERIZATION N/A 05/14/2016   Procedure: Left Heart Cath and Coronary Angiography;  Surgeon: Lennette Bihari, MD;  Location: [facility] INVASIVE CV LAB;  Service: Cardiovascular;  Laterality: N/A;  . CARDIAC CATHETERIZATION N/A 06/17/2016   Procedure: Intravascular Pressure Wire/FFR Study;  Surgeon: Tonny Bollman, MD;  Location: [facility] INVASIVE  CV LAB;  Service: Cardiovascular;  Laterality: N/A;  . CARDIOVASCULAR STRESS TEST  05/26/2012   EKG negative for ischemia, no ECG changes, no significant ischemia noted  . I & D KNEE WITH POLY EXCHANGE Bilateral 01/2011   & complete synovectomy of 3 compartments of the right total knee/notes 02/04/2011 (replacement,partial knee replacement because of bacterial infection)  . JOINT REPLACEMENT    . KNEE ARTHROSCOPY Right ~ 2002  . LUMBAR DISC SURGERY  ~ 1980; 2000  . NASAL SINUS SURGERY  08/2011  . SHOULDER ARTHROSCOPY W/ ROTATOR CUFF REPAIR Right 01/2015  . TOTAL KNEE ARTHROPLASTY Bilateral 03/26/2004-09/15/2004    right-left  . TRANSTHORACIC ECHOCARDIOGRAM  05/26/2012   EF >55%, mild concentric LVH    Current Medications: Current Meds  Medication Sig  . albuterol (PROAIR HFA) 108 (90 Base) MCG/ACT inhaler Inhale 2 puffs into the lungs every 4 (four) hours as needed for wheezing or shortness of breath.  Marland Kitchen aspirin 81 MG tablet Take 81 mg by mouth at bedtime.   Marland Kitchen atorvastatin (LIPITOR) 80 MG tablet TAKE 1 TABLET BY MOUTH ONCE DAILY AT 6PM  . budesonide-formoterol (SYMBICORT) 160-4.5 MCG/ACT inhaler Inhale 2 puffs into the lungs 2 (two) times daily.  . cyproheptadine (PERIACTIN) 4 MG tablet TAKE 1 TABLET BY MOUTH TWICE DAILY AS NEEDED FOR ALLERGIES  . famotidine (PEPCID) 20 MG tablet Take 2 tablets by mouth once daily  . ipratropium-albuterol (DUONEB) 0.5-2.5 (3) MG/3ML SOLN Take 3 mLs by nebulization every 6 (six) hours as needed.  . isosorbide mononitrate (IMDUR) 60 MG 24 hr tablet Take 1 tablet by mouth once daily  . mometasone-formoterol (DULERA) 200-5 MCG/ACT AERO Inhale 2 puffs into the lungs 2 (two) times daily.  . Multiple Vitamin (MULTIVITAMIN WITH MINERALS) TABS tablet Take 1 tablet by mouth daily.  . naproxen sodium (ALEVE) 220 MG tablet Take 220 mg by mouth daily as needed.  . nitroGLYCERIN (NITROSTAT) 0.4 MG SL tablet PLACE 1 TABLET UNDER TONGUE EVERY 5 MINUTES FOR UP TO 3 DOSES AS  NEEDED FOR CHEST PAIN  . olmesartan (BENICAR) 5 MG tablet Take 1 tablet (5 mg total) by mouth daily.  Marland Kitchen omeprazole (PRILOSEC) 40 MG capsule TAKE 1 CAPSULE BY MOUTH TWICE A DAY  . [DISCONTINUED] dexamethasone (DECADRON) 0.5 MG/5ML solution Take 0.5 mg by mouth 4 (four) times daily.  . [DISCONTINUED] olmesartan (BENICAR) 5 MG tablet Take 2 tablets by mouth once daily   Current Facility-Administered Medications for the 03/28/20 encounter (Office Visit) with Azalee Course, PA  Medication  . Mepolizumab SOLR 100 mg     Allergies:   Oxycontin [oxycodone hcl], Percocet [oxycodone-acetaminophen], and Ambien [zolpidem tartrate]   Social History   Socioeconomic History  . Marital status: Married    Spouse name: Not on file  . Number of children: 2  . Years of education: 40  . Highest education level: Not on file  Occupational History  . Occupation: retired Architect, part-time security work  Tobacco Use  . Smoking status: Former  Smoker    Packs/day: 1.00    Years: 5.00    Pack years: 5.00    Types: Cigarettes    Quit date: 12/14/1968    Years since quitting: 51.3  . Smokeless tobacco: Never Used  Substance and Sexual Activity  . Alcohol use: Yes    Alcohol/week: 1.0 standard drinks    Types: 1 Cans of beer per week    Comment: Occasional  . Drug use: No  . Sexual activity: Not on file    Comment: Married  Other Topics Concern  . Not on file  Social History Narrative   Patient lives at home with his wife . He is retired. Patient has 14 years of education. Patient has two children.   Caffeine - two cups daily.   Right-handed   Social Determinants of Health   Financial Resource Strain:   . Difficulty of Paying Living Expenses:   Food Insecurity:   . Worried About Programme researcher, broadcasting/film/video in the Last Year:   . Barista in the Last Year:   Transportation Needs:   . Freight forwarder (Medical):   Marland Kitchen Lack of Transportation (Non-Medical):   Physical Activity:   . Days of  Exercise per Week:   . Minutes of Exercise per Session:   Stress:   . Feeling of Stress :   Social Connections:   . Frequency of Communication with Friends and Family:   . Frequency of Social Gatherings with Friends and Family:   . Attends Religious Services:   . Active Member of Clubs or Organizations:   . Attends Banker Meetings:   Marland Kitchen Marital Status:      Family History: The patient's family history includes Alzheimer's disease in his father; Cancer in his paternal aunt, paternal uncle, paternal uncle, and paternal uncle; Dementia in his father; Heart disease in his mother; Heart disease (age of onset: 73) in his brother.  ROS:   Please see the history of present illness.     All other systems reviewed and are negative.  EKGs/Labs/Other Studies Reviewed:    The following studies were reviewed today:  Myoview 11/20/2019  Nuclear stress EF: 69%.  There was no ST segment deviation noted during stress.  The study is normal.  This is a low risk study.  The left ventricular ejection fraction is hyperdynamic (>65%).   Normal pharmacologic nuclear stress test with no evidence for prior infarct or ischemia.  LVEF 69%.  EKG:  EKG is ordered today.  The ekg ordered today demonstrates normal sinus rhythm without significant ST-T wave changes.  Recent Labs: 06/08/2019: BUN 19; Creatinine, Ser 0.83; Potassium 4.3; Sodium 143  Recent Lipid Panel    Component Value Date/Time   CHOL 152 11/02/2018 0937   CHOL 136 01/08/2017 0833   TRIG 128 11/02/2018 0937   TRIG 109 01/08/2017 0833   HDL 53 11/02/2018 0937   HDL 53 01/08/2017 0833   CHOLHDL 2.9 11/02/2018 0937   CHOLHDL 2.6 01/08/2017 0833   CHOLHDL 3.0 03/01/2016 0448   VLDL 17 03/01/2016 0448   LDLCALC 73 11/02/2018 0937   LDLCALC 64 01/08/2017 0833    Physical Exam:    VS:  BP 120/86 (BP Location: Left Arm, Patient Position: Sitting, Cuff Size: Normal)   Pulse 63   Temp (!) 97.3 F (36.3 C)   Ht 6\' 3"   (1.905 m)   Wt 222 lb (100.7 kg)   BMI 27.75 kg/m     Wt Readings  from Last 3 Encounters:  03/28/20 222 lb (100.7 kg)  11/28/19 219 lb (99.3 kg)  11/20/19 219 lb (99.3 kg)     GEN:  Well nourished, well developed in no acute distress HEENT: Normal NECK: No JVD; No carotid bruits LYMPHATICS: No lymphadenopathy CARDIAC: RRR, no murmurs, rubs, gallops RESPIRATORY:  Clear to auscultation without rales, wheezing or rhonchi  ABDOMEN: Soft, non-tender, non-distended MUSCULOSKELETAL:  No edema; No deformity  SKIN: Warm and dry NEUROLOGIC:  Alert and oriented x 3 PSYCHIATRIC:  Normal affect   ASSESSMENT:    1. Chest pain, unspecified type   2. Coronary artery disease involving native coronary artery of native heart without angina pectoris   3. Essential hypertension   4. Hyperlipidemia LDL goal <70    PLAN:    In order of problems listed above:  1. Chest pain: Patient had a single episode of chest pain yesterday, I recommended echocardiogram as initial assessment.  We discussed 2 options including a trial of medical therapy versus cardiac catheterization, he opted for medical therapy for now.  I will decrease his olmesartan to 5 mg daily while added carvedilol 3.125 mg twice daily.  I plan to bring the patient back in 1 to 2 weeks, if symptoms recurs, I will arrange cardiac catheterization  2. CAD: Last Myoview in December 2020 was negative.  On aspirin and Lipitor.  3. Hypertension: Add low-dose carvedilol for antianginal purposes  4. Hyperlipidemia: On Lipitor.   Medication Adjustments/Labs and Tests Ordered: Current medicines are reviewed at length with the patient today.  Concerns regarding medicines are outlined above.  Orders Placed This Encounter  Procedures  . EKG 12-Lead  . ECHOCARDIOGRAM COMPLETE   Meds ordered this encounter  Medications  . olmesartan (BENICAR) 5 MG tablet    Sig: Take 1 tablet (5 mg total) by mouth daily.    Dispense:  90 tablet    Refill:  3   . carvedilol (COREG) 3.125 MG tablet    Sig: Take 1 tablet (3.125 mg total) by mouth 2 (two) times daily.    Dispense:  180 tablet    Refill:  3    Patient Instructions  Medication Instructions:   DECREASE Olmesartan (Benicar) to 5 mg daily  START  Carvedilol (Coreg) 3.125 mg 2 times a day  *If you need a refill on your cardiac medications before your next appointment, please call your pharmacy*  Lab Work: NONE ordered at this time of appointment   If you have labs (blood work) drawn today and your tests are completely normal, you will receive your results only by: Marland Kitchen MyChart Message (if you have MyChart) OR . A paper copy in the mail If you have any lab test that is abnormal or we need to change your treatment, we will call you to review the results.  Testing/Procedures: Your physician has requested that you have an echocardiogram. Echocardiography is a painless test that uses sound waves to create images of your heart. It provides your doctor with information about the size and shape of your heart and how well your heart's chambers and valves are working. This procedure takes approximately one hour. There are no restrictions for this procedure.   Please schedule for 2 weeks  Follow-Up: At [facility], you and your health needs are our priority.  As part of our continuing mission to provide you with exceptional heart care, we have created designated Provider Care Teams.  These Care Teams include your primary Cardiologist (physician) and Advanced Practice  Providers (APPs -  Physician Assistants and Nurse Practitioners) who all work together to provide you with the care you need, when you need it.  We recommend signing up for the patient portal called "MyChart".  Sign up information is provided on this After Visit Summary.  MyChart is used to connect with patients for Virtual Visits (Telemedicine).  Patients are able to view lab/test results, encounter notes, upcoming appointments,  etc.  Non-urgent messages can be sent to your provider as well.   To learn more about what you can do with MyChart, go to [email or web address].    Your next appointment:   1-2 day(s) after Echocardiogram  The format for your next appointment:   In Person  Provider:   Azalee CourseHao Allyssia Skluzacek, PA-C  Other Instructions      Signed, Azalee CourseHao Korrina Zern, PA  03/30/2020 10:01 PM    [facility]

## 2020-03-28 NOTE — Patient Instructions (Addendum)
Medication Instructions:   DECREASE Olmesartan (Benicar) to 5 mg daily  START  Carvedilol (Coreg) 3.125 mg 2 times a day  *If you need a refill on your cardiac medications before your next appointment, please call your pharmacy*  Lab Work: NONE ordered at this time of appointment   If you have labs (blood work) drawn today and your tests are completely normal, you will receive your results only by: Marland Kitchen MyChart Message (if you have MyChart) OR . A paper copy in the mail If you have any lab test that is abnormal or we need to change your treatment, we will call you to review the results.  Testing/Procedures: Your physician has requested that you have an echocardiogram. Echocardiography is a painless test that uses sound waves to create images of your heart. It provides your doctor with information about the size and shape of your heart and how well your heart's chambers and valves are working. This procedure takes approximately one hour. There are no restrictions for this procedure.   Please schedule for 2 weeks  Follow-Up: At [facility], you and your health needs are our priority.  As part of our continuing mission to provide you with exceptional heart care, we have created designated Provider Care Teams.  These Care Teams include your primary Cardiologist (physician) and Advanced Practice Providers (APPs -  Physician Assistants and Nurse Practitioners) who all work together to provide you with the care you need, when you need it.  We recommend signing up for the patient portal called "MyChart".  Sign up information is provided on this After Visit Summary.  MyChart is used to connect with patients for Virtual Visits (Telemedicine).  Patients are able to view lab/test results, encounter notes, upcoming appointments, etc.  Non-urgent messages can be sent to your provider as well.   To learn more about what you can do with MyChart, go to [email or web address].    Your next appointment:    1-2 day(s) after Echocardiogram  The format for your next appointment:   In Person  Provider:   Azalee Course, PA-C  Other Instructions

## 2020-04-10 DIAGNOSIS — J4550 Severe persistent asthma, uncomplicated: Secondary | ICD-10-CM | POA: Diagnosis not present

## 2020-04-11 ENCOUNTER — Ambulatory Visit (INDEPENDENT_AMBULATORY_CARE_PROVIDER_SITE_OTHER): Payer: Medicare Other

## 2020-04-11 ENCOUNTER — Other Ambulatory Visit: Payer: Self-pay

## 2020-04-11 ENCOUNTER — Ambulatory Visit (HOSPITAL_COMMUNITY): Payer: Medicare Other | Attending: Internal Medicine

## 2020-04-11 DIAGNOSIS — R079 Chest pain, unspecified: Secondary | ICD-10-CM | POA: Insufficient documentation

## 2020-04-11 DIAGNOSIS — J4550 Severe persistent asthma, uncomplicated: Secondary | ICD-10-CM

## 2020-04-12 ENCOUNTER — Encounter: Payer: Self-pay | Admitting: Physician Assistant

## 2020-04-12 ENCOUNTER — Ambulatory Visit (INDEPENDENT_AMBULATORY_CARE_PROVIDER_SITE_OTHER): Payer: Medicare Other | Admitting: Physician Assistant

## 2020-04-12 VITALS — BP 126/84 | HR 67 | Temp 97.4°F | Resp 20 | Ht 75.0 in | Wt 221.6 lb

## 2020-04-12 DIAGNOSIS — I2510 Atherosclerotic heart disease of native coronary artery without angina pectoris: Secondary | ICD-10-CM | POA: Diagnosis not present

## 2020-04-12 DIAGNOSIS — E785 Hyperlipidemia, unspecified: Secondary | ICD-10-CM

## 2020-04-12 DIAGNOSIS — I10 Essential (primary) hypertension: Secondary | ICD-10-CM

## 2020-04-12 DIAGNOSIS — I712 Thoracic aortic aneurysm, without rupture, unspecified: Secondary | ICD-10-CM

## 2020-04-12 MED ORDER — ISOSORBIDE MONONITRATE ER 60 MG PO TB24: 90.0000 mg | ORAL_TABLET | Freq: Every day | ORAL | 3 refills | Status: DC

## 2020-04-12 NOTE — Patient Instructions (Signed)
Medication Instructions:   INCREASE Imdur to 90 mg daily  *If you need a refill on your cardiac medications before your next appointment, please call your pharmacy*  Lab Work: NONE ordered at this time of appointment   If you have labs (blood work) drawn today and your tests are completely normal, you will receive your results only by: Marland Kitchen MyChart Message (if you have MyChart) OR . A paper copy in the mail If you have any lab test that is abnormal or we need to change your treatment, we will call you to review the results.  Testing/Procedures: NONE ordered at this time of appointment   Follow-Up: At [facility], you and your health needs are our priority.  As part of our continuing mission to provide you with exceptional heart care, we have created designated Provider Care Teams.  These Care Teams include your primary Cardiologist (physician) and Advanced Practice Providers (APPs -  Physician Assistants and Nurse Practitioners) who all work together to provide you with the care you need, when you need it.  We recommend signing up for the patient portal called "MyChart".  Sign up information is provided on this After Visit Summary.  MyChart is used to connect with patients for Virtual Visits (Telemedicine).  Patients are able to view lab/test results, encounter notes, upcoming appointments, etc.  Non-urgent messages can be sent to your provider as well.   To learn more about what you can do with MyChart, go to [email or web address].    Your next appointment:   2-3 month(s)  The format for your next appointment:   In Person  Provider:   K. Italy [name], MD  Other Instructions

## 2020-04-12 NOTE — Progress Notes (Signed)
Cardiology Office Note:    Date:  04/14/2020   ID:  Louis Perez, DOB May 05, 1951, MRN [id number]  PCP:  [facility]  Cardiologist:  Pixie Casino, MD  Electrophysiologist:  None   Referring MD: Josefa Half*   Chief Complaint  Patient presents with  . Follow-up    seen for Dr. Debara Pickett.    History of Present Illness:    Louis Perez is a 69 y.o. male with a hx of HTN, HLD, Gilbert's disease, and CAD. Cardiac catheterization performed on 03/01/2016 showed 95% ostial LAD lesion treated with DES, 25% mid RCA, 30% distal RCA, 30% ostial RPDA lesion, 50% distal LAD lesion.  Repeat cardiac catheterization in June 2017 revealed 80% ramus lesion, 20% distal RCA lesion.  Due to recurrent symptoms, he underwent another cardiac catheterization in July 2017 that showed 75% ostial ramus lesion was FFR 0.92, 50% mid to distal LAD lesion, 30% ostial D2 lesion, 25% ostial left main, and 25% proximal RCA lesion.  Medical therapy was recommended.  He was previously on Ranexa and but he weaned himself off of Ranexa in 2018.  He remains on Imdur due to chronic intermittent chest pain.  Most recent Myoview was performed on 11/20/2019 which showed EF 69%, overall low risk study.   I last saw the patient on 03/28/2020 at which time he complained of a single episode of 5 minutes onset of right-sided chest pain radiating down the right shoulder after raking leaves.  Symptom resolved with a single sublingual nitroglycerin.  I discussed this case with DOD at the time and recommend a repeat echocardiogram.  Echocardiogram performed yesterday on 04/11/2020 showed EF 60 to 65%, grade 1 DD dilated aortic root measuring at 41 mm.  During the last office visit, I did add 3.125 mg twice daily of carvedilol to his medical regimen.  I decrease his olmesartan to 5 mg daily.  He returned today for follow-up.  He says since the last visit, he had extremely mild episode and that the  location of the chest pain can be changed on recurrence as well.  He has not had any intense chest pain since the last visit.  In fact, he has been able to do his everyday work without any exertional symptoms.  The chest pain does not seems to correlate with the degree of exertion.  At this time, given how light his symptom is, I recommended medical management.  He is tolerating the low-dose carvedilol added last time.  I will increase his Imdur to 90 mg daily.  Otherwise he can follow-up with Dr. Debara Pickett in 2 to 3 months.  He is aware that if his chest pain increase in frequency, duration or began to occur more so with physical activity, she is to contact cardiology immediately.    Past Medical History:  Diagnosis Date  . Arthritis    "hips, lower back, knees, shoulders, hands" (05/14/2016)  . Asthma dx'd in 2014  . Bacterial septicemia ([place]) 01/2011   "both knee involved"  . CAD (coronary artery disease)    a. NSTEMI 02/2016 s/p DES to LAD. b. Relook cath due to angina/prior high risk anatomy 05/14/16 -> continued medical therapy given concern that a stent strut could inferere with Cx itself.  Marland Kitchen GERD (gastroesophageal reflux disease)   . Gilbert's disease   . Hyperlipidemia   . Hypertension   . 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 NSTEMI (non-ST elevated myocardial infarction) ([place]) 02/2016   /  notes 03/02/2016  . Sinus bradycardia   . Unspecified tinnitus    right    Past Surgical History:  Procedure Laterality Date  . BACK SURGERY    . CARDIAC CATHETERIZATION N/A 03/01/2016   Procedure: Left Heart Cath and Coronary Angiography;  Surgeon: Tonny Bollman, MD;  oLAD 95%, CFX/RI/RCA/RPDA 25-30%  . CARDIAC CATHETERIZATION N/A 03/01/2016   Procedure: Coronary Stent Intervention;  Surgeon: Tonny Bollman, MD;  Promus Premier 4.0 x  12 mm DES to oLAD  . CARDIAC CATHETERIZATION N/A 05/14/2016   Procedure: Left Heart Cath and Coronary Angiography;  Surgeon: Lennette Bihari, MD;  Location: [facility] INVASIVE  CV LAB;  Service: Cardiovascular;  Laterality: N/A;  . CARDIAC CATHETERIZATION N/A 06/17/2016   Procedure: Intravascular Pressure Wire/FFR Study;  Surgeon: Tonny Bollman, MD;  Location[facility] INVASIVE CV LAB;  Service: Cardiovascular;  Laterality: N/A;  . CARDIOVASCULAR STRESS TEST  05/26/2012   EKG negative for ischemia, no ECG changes, no significant ischemia noted  . I & D KNEE WITH POLY EXCHANGE Bilateral 01/2011   & complete synovectomy of 3 compartments of the right total knee/notes 02/04/2011 (replacement,partial knee replacement because of bacterial infection)  . JOINT REPLACEMENT    . KNEE ARTHROSCOPY Right ~ 2002  . LUMBAR DISC SURGERY  ~ 1980; 2000  . NASAL SINUS SURGERY  08/2011  . SHOULDER ARTHROSCOPY W/ ROTATOR CUFF REPAIR Right 01/2015  . TOTAL KNEE ARTHROPLASTY Bilateral 03/26/2004-09/15/2004    right-left  . TRANSTHORACIC ECHOCARDIOGRAM  05/26/2012   EF >55%, mild concentric LVH    Current Medications: Current Meds  Medication Sig  . albuterol (PROAIR HFA) 108 (90 Base) MCG/ACT inhaler Inhale 2 puffs into the lungs every 4 (four) hours as needed for wheezing or shortness of breath.  Marland Kitchen aspirin 81 MG tablet Take 81 mg by mouth at bedtime.   Marland Kitchen atorvastatin (LIPITOR) 80 MG tablet TAKE 1 TABLET BY MOUTH ONCE DAILY AT 6PM  . budesonide-formoterol (SYMBICORT) 160-4.5 MCG/ACT inhaler Inhale 2 puffs into the lungs 2 (two) times daily.  . carvedilol (COREG) 3.125 MG tablet Take 1 tablet (3.125 mg total) by mouth 2 (two) times daily.  . cyproheptadine (PERIACTIN) 4 MG tablet TAKE 1 TABLET BY MOUTH TWICE DAILY AS NEEDED FOR ALLERGIES  . famotidine (PEPCID) 20 MG tablet Take 2 tablets by mouth once daily  . ipratropium-albuterol (DUONEB) 0.5-2.5 (3) MG/3ML SOLN Take 3 mLs by nebulization every 6 (six) hours as needed.  . isosorbide mononitrate (IMDUR) 60 MG 24 hr tablet Take 1.5 tablets (90 mg total) by mouth daily.  . mometasone-formoterol (DULERA) 200-5 MCG/ACT AERO Inhale 2 puffs into the  lungs 2 (two) times daily.  . Multiple Vitamin (MULTIVITAMIN WITH MINERALS) TABS tablet Take 1 tablet by mouth daily.  . naproxen sodium (ALEVE) 220 MG tablet Take 220 mg by mouth daily as needed.  . nitroGLYCERIN (NITROSTAT) 0.4 MG SL tablet PLACE 1 TABLET UNDER TONGUE EVERY 5 MINUTES FOR UP TO 3 DOSES AS NEEDED FOR CHEST PAIN  . olmesartan (BENICAR) 5 MG tablet Take 1 tablet (5 mg total) by mouth daily.  Marland Kitchen omeprazole (PRILOSEC) 40 MG capsule TAKE 1 CAPSULE BY MOUTH TWICE A DAY  . [DISCONTINUED] isosorbide mononitrate (IMDUR) 60 MG 24 hr tablet Take 1 tablet by mouth once daily   Current Facility-Administered Medications for the 04/12/20 encounter (Office Visit) with Azalee Course, PA  Medication  . Mepolizumab SOLR 100 mg     Allergies:   Oxycontin [oxycodone hcl], Percocet [oxycodone-acetaminophen], and Ambien [zolpidem tartrate]  Social History   Socioeconomic History  . Marital status: Married    Spouse name: Not on file  . Number of children: 2  . Years of education: 63  . Highest education level: Not on file  Occupational History  . Occupation: retired Architect, part-time security work  Tobacco Use  . Smoking status: Former Smoker    Packs/day: 1.00    Years: 5.00    Pack years: 5.00    Types: Cigarettes    Quit date: 12/14/1968    Years since quitting: 51.3  . Smokeless tobacco: Never Used  Substance and Sexual Activity  . Alcohol use: Yes    Alcohol/week: 1.0 standard drinks    Types: 1 Cans of beer per week    Comment: Occasional  . Drug use: No  . Sexual activity: Not on file    Comment: Married  Other Topics Concern  . Not on file  Social History Narrative   Patient lives at home with his wife . He is retired. Patient has 14 years of education. Patient has two children.   Caffeine - two cups daily.   Right-handed   Social Determinants of Health   Financial Resource Strain:   . Difficulty of Paying Living Expenses:   Food Insecurity:   . Worried About  Programme researcher, broadcasting/film/video in the Last Year:   . Barista in the Last Year:   Transportation Needs:   . Freight forwarder (Medical):   Marland Kitchen Lack of Transportation (Non-Medical):   Physical Activity:   . Days of Exercise per Week:   . Minutes of Exercise per Session:   Stress:   . Feeling of Stress :   Social Connections:   . Frequency of Communication with Friends and Family:   . Frequency of Social Gatherings with Friends and Family:   . Attends Religious Services:   . Active Member of Clubs or Organizations:   . Attends Banker Meetings:   Marland Kitchen Marital Status:      Family History: The patient's family history includes Alzheimer's disease in his father; Cancer in his paternal aunt, paternal uncle, paternal uncle, and paternal uncle; Dementia in his father; Heart disease in his mother; Heart disease (age of onset: 3) in his brother.  ROS:   Please see the history of present illness.     All other systems reviewed and are negative.  EKGs/Labs/Other Studies Reviewed:    The following studies were reviewed today:  Echo 04/11/2020 1. Left ventricular ejection fraction, by estimation, is 60 to 65%. The left ventricle has normal function. The left ventricle has no regional wall motion abnormalities. Left ventricular diastolic parameters are consistent with Grade I diastolic dysfunction (impaired relaxation).  2. Right ventricular systolic function is normal. The right ventricular size is normal.  3. The mitral valve is grossly normal. No evidence of mitral valve  regurgitation.  4. The aortic valve is tricuspid. Aortic valve regurgitation is not visualized.  5. Aortic dilatation noted. Aneurysm of the ascending aorta, measuring 41  mm. There is mild dilatation at the level of the sinuses of Valsalva measuring 40 mm.  6. The inferior vena cava is normal in size with greater than 50% respiratory variability, suggesting right atrial pressure of 3 mmHg.  EKG:  EKG is  not ordered today.    Recent Labs: 06/08/2019: BUN 19; Creatinine, Ser 0.83; Potassium 4.3; Sodium 143  Recent Lipid Panel    Component Value Date/Time   CHOL 152  11/02/2018 0937   CHOL 136 01/08/2017 0833   TRIG 128 11/02/2018 0937   TRIG 109 01/08/2017 0833   HDL 53 11/02/2018 0937   HDL 53 01/08/2017 0833   CHOLHDL 2.9 11/02/2018 0937   CHOLHDL 2.6 01/08/2017 0833   CHOLHDL 3.0 03/01/2016 0448   VLDL 17 03/01/2016 0448   LDLCALC 73 11/02/2018 0937   LDLCALC 64 01/08/2017 0833    Physical Exam:    VS:  BP 126/84   Pulse 67   Temp (!) 97.4 F (36.3 C) Comment: Forehead  Resp 20   Ht 6\' 3"  (1.905 m)   Wt 221 lb 9.6 oz (100.5 kg)   SpO2 94%   BMI 27.70 kg/m     Wt Readings from Last 3 Encounters:  04/12/20 221 lb 9.6 oz (100.5 kg)  03/28/20 222 lb (100.7 kg)  11/28/19 219 lb (99.3 kg)     GEN:  Well nourished, well developed in no acute distress HEENT: Normal NECK: No JVD; No carotid bruits LYMPHATICS: No lymphadenopathy CARDIAC: RRR, no murmurs, rubs, gallops RESPIRATORY:  Clear to auscultation without rales, wheezing or rhonchi  ABDOMEN: Soft, non-tender, non-distended MUSCULOSKELETAL:  No edema; No deformity  SKIN: Warm and dry NEUROLOGIC:  Alert and oriented x 3 PSYCHIATRIC:  Normal affect   ASSESSMENT:    1. Coronary artery disease involving native coronary artery of native heart without angina pectoris   2. Essential hypertension   3. Hyperlipidemia LDL goal <70   4. Thoracic aortic aneurysm without rupture (HCC)    PLAN:    In order of problems listed above:  1. CAD: No recurrent chest pain, I recommend increase Imdur to 90 mg daily for antianginal purposes.  On aspirin and Lipitor  2. Hypertension: Blood pressure stable, uptitrate Imdur to 90 mg daily  3. Hyperlipidemia: Continue Lipitor  4. Thoracic aortic aneurysm: Recent echocardiogram showed dilated ascending aorta measuring at 41 mm   Medication Adjustments/Labs and Tests  Ordered: Current medicines are reviewed at length with the patient today.  Concerns regarding medicines are outlined above.  No orders of the defined types were placed in this encounter.  Meds ordered this encounter  Medications  . isosorbide mononitrate (IMDUR) 60 MG 24 hr tablet    Sig: Take 1.5 tablets (90 mg total) by mouth daily.    Dispense:  135 tablet    Refill:  3    New Rx change in dosage amount    Patient Instructions  Medication Instructions:   INCREASE Imdur to 90 mg daily  *If you need a refill on your cardiac medications before your next appointment, please call your pharmacy*  Lab Work: NONE ordered at this time of appointment   If you have labs (blood work) drawn today and your tests are completely normal, you will receive your results only by: Marland Kitchen. MyChart Message (if you have MyChart) OR . A paper copy in the mail If you have any lab test that is abnormal or we need to change your treatment, we will call you to review the results.  Testing/Procedures: NONE ordered at this time of appointment   Follow-Up: At [facility], you and your health needs are our priority.  As part of our continuing mission to provide you with exceptional heart care, we have created designated Provider Care Teams.  These Care Teams include your primary Cardiologist (physician) and Advanced Practice Providers (APPs -  Physician Assistants and Nurse Practitioners) who all work together to provide you with the care you  need, when you need it.  We recommend signing up for the patient portal called "MyChart".  Sign up information is provided on this After Visit Summary.  MyChart is used to connect with patients for Virtual Visits (Telemedicine).  Patients are able to view lab/test results, encounter notes, upcoming appointments, etc.  Non-urgent messages can be sent to your provider as well.   To learn more about what you can do with MyChart, go to [email or web address].    Your next  appointment:   2-3 month(s)  The format for your next appointment:   In Person  Provider:   K. Italy [name], MD  Other Instructions      Signed, Azalee Course, PA  04/14/2020 11:44 PM    [facility]

## 2020-04-12 NOTE — Progress Notes (Signed)
Discussed during office visit today.

## 2020-04-14 ENCOUNTER — Encounter: Payer: Self-pay | Admitting: Physician Assistant

## 2020-05-07 ENCOUNTER — Other Ambulatory Visit: Payer: Self-pay

## 2020-05-07 ENCOUNTER — Encounter: Payer: Self-pay | Admitting: Allergy & Immunology

## 2020-05-07 ENCOUNTER — Ambulatory Visit (INDEPENDENT_AMBULATORY_CARE_PROVIDER_SITE_OTHER): Payer: Medicare Other | Admitting: Allergy & Immunology

## 2020-05-07 VITALS — BP 118/68 | HR 69 | Temp 97.8°F | Resp 17 | Ht 75.0 in | Wt 223.0 lb

## 2020-05-07 DIAGNOSIS — J3089 Other allergic rhinitis: Secondary | ICD-10-CM | POA: Diagnosis not present

## 2020-05-07 DIAGNOSIS — K219 Gastro-esophageal reflux disease without esophagitis: Secondary | ICD-10-CM

## 2020-05-07 DIAGNOSIS — J4551 Severe persistent asthma with (acute) exacerbation: Secondary | ICD-10-CM

## 2020-05-07 DIAGNOSIS — I2510 Atherosclerotic heart disease of native coronary artery without angina pectoris: Secondary | ICD-10-CM

## 2020-05-07 MED ORDER — ALBUTEROL SULFATE HFA 108 (90 BASE) MCG/ACT IN AERS: 4.0000 | INHALATION_SPRAY | Freq: Four times a day (QID) | RESPIRATORY_TRACT | 2 refills | Status: DC | PRN

## 2020-05-07 NOTE — Patient Instructions (Addendum)
1. Severe persistent asthma, uncomplicated - Lung testing was in the 50% range today, which is much lower than when we saw you last time. - You did have improvement with the nebulizer treatment.   - Start a prednisone burst today.  - Stop the Symbicort temporarily and start Breztri two puffs twice daily (this contains Symbicort + another medication to help keep lungs open).  - If you feel like it works better, let me know and we can send it in (hopefully it is covered better).  - Daily controller medication(s): Breztri 160mg  two puffs twice daily with spacer + Nucala 100 mg monthly - Prior to physical activity: albuterol 2 puffs 10-15 minutes before physical activity. - Rescue medications: albuterol 4 puffs every 4-6 hours as needed - Asthma control goals:  * Full participation in all desired activities (may need albuterol before activity) * Albuterol use two time or less a week on average (not counting use with activity) * Cough interfering with sleep two time or less a month * Oral steroids no more than once a year * No hospitalizations  2. Allergic rhinitis - Continue with nasal ipratropium one spray per nostril up to four tines daily as needed for runny noses.   3. LPRD (laryngopharyngeal reflux disease) - Continue with the use of the omeprazole and Pepcid.  4. Return in about 6 months (around 11/07/2020). This can be an in-person, a virtual Webex or a telephone follow up visit.   Please inform 11/09/2020 of any Emergency Department visits, hospitalizations, or changes in symptoms. Call us before going to the ED for breathing or allergy symptoms since we might be able to fit you in for a sick visit. Feel free to contact us anytime with any questions, problems, or concerns.  It was a pleasure to meet you today!  Websites that have reliable patient information: 1. American Academy of Asthma, Allergy, and Immunology: www.aaaai.org 2. Food Allergy Research and Education (FARE):  foodallergy.org 3. Mothers of Asthmatics: http://www.asthmacommunitynetwork.org 4. American College of Allergy, Asthma, and Immunology: www.acaai.org  "Like" [place] on Facebook and Instagram for our latest updates!      Make sure you are registered to vote! If you have moved or changed any of your contact information, you will need to get this updated before voting!  In some cases, you MAY be able to register to vote online: [place]

## 2020-05-07 NOTE — Progress Notes (Signed)
FOLLOW UP  Date of Service/Encounter:  05/07/20   Assessment:   Severe persistent asthma, with acute exacerbation  Allergic rhinitis  LPRD (laryngopharyngeal reflux disease)  Plan/Recommendations:   1. Severe persistent asthma, uncomplicated - Lung testing was in the 50% range today, which is much lower than when we saw you last time. - You did have improvement with the nebulizer treatment.   - Start a prednisone burst today.  - Stop the Symbicort temporarily and start Breztri two puffs twice daily (this contains Symbicort + another medication to help keep lungs open).  - If you feel like it works better, let me know and we can send it in (hopefully it is covered better).  - Daily controller medication(s): Breztri 160mg  two puffs twice daily with spacer + Nucala 100 mg monthly - Prior to physical activity: albuterol 2 puffs 10-15 minutes before physical activity. - Rescue medications: albuterol 4 puffs every 4-6 hours as needed - Asthma control goals:  * Full participation in all desired activities (may need albuterol before activity) * Albuterol use two time or less a week on average (not counting use with activity) * Cough interfering with sleep two time or less a month * Oral steroids no more than once a year * No hospitalizations  2. Allergic rhinitis - Continue with nasal ipratropium one spray per nostril up to four tines daily as needed for runny noses.   3. LPRD (laryngopharyngeal reflux disease) - Continue with the use of the omeprazole and Pepcid.  4. Return in about 6 months (around 11/07/2020). This can be an in-person, a virtual Webex or a telephone follow up visit.   Subjective:   Louis Perez is a 69 y.o. male presenting today for follow up of  Chief Complaint  Patient presents with  . Asthma    having to use albuterol inhaler more often. Wheezing.    78 has a history of the following: Patient Active Problem List   Diagnosis Date Noted  . Asthma, severe persistent, well-controlled 11/09/2018  . Other allergic rhinitis 11/09/2018  . LPRD (laryngopharyngeal reflux disease) 11/09/2018  . Stomatitis and mucositis 11/09/2018  . Pansinusitis 01/06/2017  . Hyperlipidemia LDL goal <70 07/23/2016  . Anginal chest pain at rest [facility]) 06/17/2016  . CAD in native artery 06/15/2016  . Stented coronary artery 06/15/2016  . Pain in the chest 05/28/2016  . Sinus bradycardia 05/15/2016  . Unstable angina (HCC) 05/14/2016  . CAD (coronary artery disease), native coronary artery 05/14/2016  . Dyslipidemia 09/28/2013  . GERD (gastroesophageal reflux disease) 09/28/2013  . 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 Moderate persistent chronic asthma with acute exacerbation 06/05/2013  . Essential hypertension 06/05/2013    History obtained from: chart review and patient.  Louis Perez is a 69 y.o. male presenting for a follow up visit.  He was last seen in November 2020.  At that time, his lung testing looked slightly lower.  We started him on a prednisone burst and changed his Dulera to Symbicort 160/4.5 mcg 2 puffs twice daily.  We continued with mepolizumab 100 mg every month.  For his rhinitis, we started nasal ipratropium 1 spray per nostril up to 4 times daily and we continued omeprazole and Pepcid for his reflux.  Since last visit, he has mostly done well. He seems fairly happy with how well he is doing.   Asthma/Respiratory Symptom History: when he first saw Dr. December 2020 in February 2018, he had an absolute eosinophil count of 1200.  He was  started on mepolizumab shortly thereafter. He remains on the Symbicort since this was much cheaper than the [facility]. He has not needed prednisone at all since the last visit at all. He has been having increased coughing and wheezing over the last three weeks, however. /he has been using his rescue inhaler more often than before.   Allergic Rhinitis Symptom History: He remains in nasal ipratropium.   He has not needed any antibiotics at all.  He is doing fairly well from a rhinitis standpoint.   He is a retired Emergency planning/management officer.  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  He then helped with his son, who worked in a long care business.  Otherwise, there have been no changes to his past medical history, surgical history, family history, or social history.    Review of Systems  Constitutional: Negative.  Negative for chills, fever, malaise/fatigue and weight loss.  HENT: Negative.  Negative for congestion, ear discharge, ear pain, sinus pain and sore throat.   Eyes: Negative for pain, discharge and redness.  Respiratory: Positive for cough, shortness of breath and wheezing. Negative for sputum production.   Cardiovascular: Negative.  Negative for chest pain and palpitations.  Gastrointestinal: Negative for abdominal pain, constipation, diarrhea, heartburn, nausea and vomiting.  Skin: Negative.  Negative for itching and rash.  Neurological: Negative for dizziness and headaches.  Endo/Heme/Allergies: Negative for environmental allergies. Does not bruise/bleed easily.       Objective:   Blood pressure 118/68, pulse 69, temperature 97.8 F (36.6 C), temperature source Temporal, resp. rate 17, height 6\' 3"  (1.905 m), weight 223 lb (101.2 kg), SpO2 94 %. Body mass index is 27.87 kg/m.   Physical Exam:  Physical Exam  Constitutional: He appears well-developed.  HENT:  Head: Normocephalic and atraumatic.  Right Ear: Tympanic membrane, external ear and ear canal normal.  Left Ear: Tympanic membrane and ear canal normal.  Nose: No mucosal edema, rhinorrhea, nasal deformity or septal deviation. No epistaxis. Right sinus exhibits no maxillary sinus tenderness and no frontal sinus tenderness. Left sinus exhibits no maxillary sinus tenderness and no frontal sinus tenderness.  Mouth/Throat: Uvula is midline and oropharynx is clear and moist.  Mucous membranes are not pale and not dry.  Eyes: Pupils are equal, round, and reactive to light. Conjunctivae and EOM are normal. Right eye exhibits no chemosis and no discharge. Left eye exhibits no chemosis and no discharge. Right conjunctiva is not injected. Left conjunctiva is not injected.  Cardiovascular: Normal rate, regular rhythm and normal heart sounds.  Respiratory: Effort normal. No accessory muscle usage. No tachypnea. No respiratory distress. He has wheezes in the right upper field, the right middle field, the right lower field, the left upper field and the left middle field. He has no rhonchi. He has no rales. He exhibits no tenderness.  Inspiratory and expiratory wheezing throughout.  Lymphadenopathy:    He has no cervical adenopathy.  Neurological: He is alert.  Skin: No abrasion, no petechiae and no rash noted. Rash is not papular, not vesicular and not urticarial. No erythema. No pallor.  No eczematous or urticarial lesions noted.   Psychiatric: He has a normal mood and affect.     Diagnostic studies:    Spirometry: results abnormal (FEV1: 1.95/49%, FVC: 2.89/54%, FEV1/FVC: 67%).    Spirometry consistent with mixed obstructive and restrictive disease. Compared to those at the last visit, they are fairly stable. Review of the last few years shows  that he has gotten into the 60% range with the FEV1 and FVC in the past. Xopenex/Atrovent nebulizer treatment given in clinic with significant improvement in FEV1 per ATS criteria.  Allergy Studies: none      Salvatore Marvel, MD  [facility] [place] [facility] [place]

## 2020-05-08 ENCOUNTER — Encounter: Payer: Self-pay | Admitting: Allergy & Immunology

## 2020-05-08 DIAGNOSIS — J4550 Severe persistent asthma, uncomplicated: Secondary | ICD-10-CM

## 2020-05-09 ENCOUNTER — Other Ambulatory Visit: Payer: Self-pay

## 2020-05-09 ENCOUNTER — Ambulatory Visit (INDEPENDENT_AMBULATORY_CARE_PROVIDER_SITE_OTHER): Payer: Medicare Other

## 2020-05-09 DIAGNOSIS — J4550 Severe persistent asthma, uncomplicated: Secondary | ICD-10-CM

## 2020-06-05 DIAGNOSIS — J4550 Severe persistent asthma, uncomplicated: Secondary | ICD-10-CM | POA: Diagnosis not present

## 2020-06-06 ENCOUNTER — Other Ambulatory Visit: Payer: Self-pay

## 2020-06-06 ENCOUNTER — Ambulatory Visit (INDEPENDENT_AMBULATORY_CARE_PROVIDER_SITE_OTHER): Payer: Medicare Other

## 2020-06-06 DIAGNOSIS — J4550 Severe persistent asthma, uncomplicated: Secondary | ICD-10-CM

## 2020-06-10 ENCOUNTER — Other Ambulatory Visit: Payer: Self-pay | Admitting: Allergy & Immunology

## 2020-06-10 ENCOUNTER — Other Ambulatory Visit: Payer: Self-pay | Admitting: Internal Medicine

## 2020-06-25 ENCOUNTER — Other Ambulatory Visit: Payer: Self-pay

## 2020-06-25 ENCOUNTER — Encounter: Payer: Self-pay | Admitting: Internal Medicine

## 2020-06-25 ENCOUNTER — Ambulatory Visit (INDEPENDENT_AMBULATORY_CARE_PROVIDER_SITE_OTHER): Payer: Medicare Other | Admitting: Internal Medicine

## 2020-06-25 VITALS — BP 128/84 | HR 62 | Ht 75.0 in | Wt 222.2 lb

## 2020-06-25 DIAGNOSIS — I10 Essential (primary) hypertension: Secondary | ICD-10-CM

## 2020-06-25 DIAGNOSIS — E785 Hyperlipidemia, unspecified: Secondary | ICD-10-CM

## 2020-06-25 DIAGNOSIS — I2510 Atherosclerotic heart disease of native coronary artery without angina pectoris: Secondary | ICD-10-CM | POA: Diagnosis not present

## 2020-06-25 NOTE — Progress Notes (Signed)
Cardiology Office Note   Date:  06/25/2020   ID:  TREVYON SWOR, DOB 03-13-1951, MRN [id number]  PCP:  [facility]  Cardiologist:   Chrystie Nose, MD   CC: Follow-up stress test  History of Present Illness: Louis Perez is a 69 y.o. male who presents for evaluation of chest and back pain. Patient has a history of coronary disease with recent NSTEMI in March with DES stenting of the LAD.  He had non obstructive disease elsewhere.  EF was normal.    He was added to my schedule today for evaluation of chest and arm discomfort. This began on Sunday. It is similar to his previous unstable angina but not as severe. Still he describes the peak of his discomfort at 7 out of 10 in intensity. He gets across his chest. His left arm hurt. At times he has noticed some diaphoresis. It might last for only a minute. He has taken nitroglycerin and over the course of the last few days and it improves the symptoms. He describes it as somewhat heavy. He's not had any palpitations, presyncope or syncope. He's not describing any new shortness of breath, PND or orthopnea. He has brought the discomfort on a few times when he's been doing activities although this is been intermittent. He was actually able to do a little golfing yesterday without symptoms. However, prior to this he had discomfort with golfing building his children sandbox. This morning the pain was at rest. He still describes a little mid chest tightness.  Of note he has not had the symptoms since his stent.  I did review his catheterization. I looked at the images and he had a very high grade proximal LAD in March with nonobstructive disease in the right coronary.  05/28/2016  Louis Perez is a 69 year old patient who I last saw in 2014. He did not see me in follow-up since that time and is seen a number of other providers in our practice, ultimately leading to presentation with an STEMI in March with a  placement of a drug-eluting stent in the proximal LAD. Subsequent to that he had chest pain as described above and was referred back to the hospital for coronary evaluation. Catheterization indicated an 80% ostial ramus lesion at the area of partially jailed vessel due to the proximal LAD stent. It was felt that intervention could compromise the LAD therefore medical therapy was recommended. His isosorbide was doubled to 60 mg daily. Since then he reports he has had some improvement in chest discomfort but has significant headache.  06/15/2016  Louis Perez was seen back today in the office for chest pain. When I last saw him we had increased his isosorbide up to 60 mg daily and he's been on Ranexa thousand milligrams twice a day. He reports he continues to have episodes of chest discomfort which can occur at rest or with exertion. I've advised that he stop cardiac rehabilitation until we can determine whether or not his pain is related to coronary obstruction.  07/23/2016  Louis Perez returns for follow-up today. He underwent repeat cardiac catheterization by Dr. Excell Seltzer. This showed the following:  Conclusion    Prox RCA to Mid RCA lesion, 25% stenosed.  Ost LM lesion, 25% stenosed.  Ost Ramus lesion, 75% stenosed.  Mid LAD to Dist LAD lesion, 50% stenosed.  Ost 2nd Diag to 2nd Diag lesion, 30% stenosed.  1. Continued patency of the proximal LAD stent 2. Mild  diffuse nonobstructive RCA stenosis with diffuse coronary ectasia 3. Patent left circumflex 4. Moderate-severe stenosis of the ramus intermedius with negative pressure wire analysis (FFR = 0.92 baseline and 0.85 at peak hyperemia)  Recommend: resume cardiac rehab, medical therapy   No intervention was performed. Since discharge she notes that he's had no further chest pain. He feels like the change in his isosorbide is been helpful. He is about to finish card agreeable dictation and will continue to exercise at the [facility]. He's lost  about 5-7 pounds and is well within the normal weight range at this time.  01/04/2017  Louis Perez returns today for follow-up. He reports that initially had some improvement with increasing his medical therapy for angina but still continues to have sharp, intermittent chest pains on a daily basis. This is typically in the right sternal area. He would not clearly say that his symptoms are progressively getting worse. Blood pressure is well-controlled today. His LDL cholesterol is 93 on 80 mg Lipitor. I advised him that his goal cholesterol should be much lower and that we may have difficulty reaching that since he is on a high potency statin. Options include adding ezetimibe or perhaps enrolling him in the Orion-10 trial. He did seem to be interested in the study medication. His last lipid profile was earlier this year and he will be due for repeat blood work. He is also had problems with asthma and has seen Dr. Sherene Sires for evaluation of this and to include cyclical shortness of breath.  03/22/2017  Louis Perez returns today for follow-up. He is doing fairly well denies any recurrent chest pain. Unfortunately has an upper respiratory infection and is had a flare of his asthma recently. Cholesterol is much better control with LDL now down to 64 on recent lab work with an LDL particle number less than 1000. ApoB and LPA level both at goal as well. It's now been over one year since his drug-eluting stent. He underwent a relook catheterization which showed a patent stent a couple months after his initial procedure for ongoing chest pain. He is also on Ranexa and isosorbide. He is interested in coming off some of his medicines.  08/02/2017  Louis Perez returns today for follow-up. He reports he occasionally gets some chest discomfort but its quick, sharp and atypical for coronary disease. He's found nothing to be limiting of his activities. He weaned himself off Ranexa by his request. He remains on isosorbide 60 mg  twice a day. He has not required short acting nitroglycerin. Overall he feels fairly well. His energy level is good. He is on atorvastatin 80 mg with an LDL C a less than 70. Blood pressure is at goal.  02/15/2018  Mr. Pulliam returns today for follow-up.  He continues to get occasional sharp chest discomfort.  This is a similar complaint he had in August of last year.  He took himself off Ranexa and noticed no difference in his symptoms.  He is on isosorbide.  He said none of the episodes last long enough to take short acting nitro.  His wife is concerned only that he seems to mention it more frequently.  The episodes are not associated with exertion or relieved by rest.  EKG personally reviewed today shows normal sinus rhythm without ischemic changes at 64.  09/26/2018  Mr. Kemmerling is seen today in follow-up.  Over the past year he is done very well.  He is now walking up to about 20 miles a week.  He denies any sharp chest pains or worsening shortness of breath.  He is off of his antianginal medications.  He had lipid testing this summer and LDL was 81.  He reports a pretty healthy diet however does have some intake of saturated fats that could be improved.  11/14/2019  [name] is seen today in follow-up.  This is a routine visit however he has been having some symptoms.  Recently has had some worsening dyspnea on exertion.  He did see his pulmonologist and had medications adjusted for his asthma and was treated for possible upper respiratory infection.  He is also been struggling with sores in his mouth and was placed on a steroid rinse for that.  He is noted labile blood pressures recently.  His symptoms are worse with regards to dyspnea walking up hills but he denies any chest pain.  His last coronary evaluation was in 2017 at which time he had FFR of the ramus intermedius which was 0.85 noting a 75% ostial lesion.  This was not intervened upon.  He also had 50% mid to distal LAD stenosis and some  mild disease of the other coronaries.  He has been on medical therapy since then.  I am concerned that he is possibly had some progressive coronary disease.  11/28/2019  [name] returns today for follow-up.  He underwent stress testing which was negative for ischemia and demonstrated normal LV function.  After adjusting his medications his blood pressure is now better controlled.  He says the shortness of breath has improved as well.  06/25/2020  [name] is seen today in follow-up.  Recently he was seen by Azalee CourseHao Meng, PA-C in April.  At that time he was having some intermediate chest discomfort.  His Imdur was increased.  Since then he said no more symptoms.  Overall he says he feels really well.  He gets some occasional shortness of breath but he attributes this to his asthma which is quite significant requiring a number of inhalers and immunotherapy.  Blood pressures well controlled today.  He is overdue for lipid profile which was last assessed in November 2019.  LDL at the time was 73.  EKG personally reviewed today shows sinus rhythm.  Past Medical History:  Diagnosis Date  . Arthritis    "hips, lower back, knees, shoulders, hands" (05/14/2016)  . Asthma dx'd in 2014  . Bacterial septicemia (HCC) 01/2011   "both knee involved"  . CAD (coronary artery disease)    a. NSTEMI 02/2016 s/p DES to LAD. b. Relook cath due to angina/prior high risk anatomy 05/14/16 -> continued medical therapy given concern that a stent strut could inferere with Cx itself.  Marland Kitchen. GERD (gastroesophageal reflux disease)   . Gilbert's disease   . Hyperlipidemia   . Hypertension   . 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 NSTEMI (non-ST elevated myocardial infarction) (HCC) 02/2016   [name]/notes 03/02/2016  . Sinus bradycardia   . Unspecified tinnitus    right    Past Surgical History:  Procedure Laterality Date  . BACK SURGERY    . CARDIAC CATHETERIZATION N/A 03/01/2016   Procedure: Left Heart Cath and Coronary  Angiography;  Surgeon: Tonny BollmanMichael Cooper, MD;  oLAD 95%, CFX/RI/RCA/RPDA 25-30%  . CARDIAC CATHETERIZATION N/A 03/01/2016   Procedure: Coronary Stent Intervention;  Surgeon: Tonny BollmanMichael Cooper, MD;  Promus Premier 4.0 x  12 mm DES to oLAD  . CARDIAC CATHETERIZATION N/A 05/14/2016   Procedure: Left Heart Cath and Coronary Angiography;  Surgeon: Lennette Bihari, MD;  Location: [facility] INVASIVE CV LAB;  Service: Cardiovascular;  Laterality: N/A;  . CARDIAC CATHETERIZATION N/A 06/17/2016   Procedure: Intravascular Pressure Wire/FFR Study;  Surgeon: Tonny Bollman, MD;  Location: [facility] INVASIVE CV LAB;  Service: Cardiovascular;  Laterality: N/A;  . CARDIOVASCULAR STRESS TEST  05/26/2012   EKG negative for ischemia, no ECG changes, no significant ischemia noted  . I & D KNEE WITH POLY EXCHANGE Bilateral 01/2011   & complete synovectomy of 3 compartments of the right total knee/notes 02/04/2011 (replacement,partial knee replacement because of bacterial infection)  . JOINT REPLACEMENT    . KNEE ARTHROSCOPY Right ~ 2002  . LUMBAR DISC SURGERY  ~ 1980; 2000  . NASAL SINUS SURGERY  08/2011  . SHOULDER ARTHROSCOPY W/ ROTATOR CUFF REPAIR Right 01/2015  . TOTAL KNEE ARTHROPLASTY Bilateral 03/26/2004-09/15/2004    right-left  . TRANSTHORACIC ECHOCARDIOGRAM  05/26/2012   EF >55%, mild concentric LVH     Current Outpatient Medications  Medication Sig Dispense Refill  . albuterol (PROAIR HFA) 108 (90 Base) MCG/ACT inhaler Inhale 4 puffs into the lungs every 6 (six) hours as needed for wheezing or shortness of breath. 18 g 2  . aspirin 81 MG tablet Take 81 mg by mouth at bedtime.     Marland Kitchen atorvastatin (LIPITOR) 80 MG tablet TAKE 1 TABLET BY MOUTH ONCE DAILY AT  6  PM 90 tablet 2  . budesonide-formoterol (SYMBICORT) 160-4.5 MCG/ACT inhaler Inhale 2 puffs into the lungs 2 (two) times daily.    . carvedilol (COREG) 3.125 MG tablet Take 1 tablet (3.125 mg total) by mouth 2 (two) times daily. 180 tablet 3  . cyproheptadine (PERIACTIN) 4 MG  tablet TAKE 1 TABLET BY MOUTH TWICE DAILY AS NEEDED FOR ALLERGIES 60 tablet 1  . famotidine (PEPCID) 20 MG tablet Take 2 tablets by mouth once daily 180 tablet 0  . isosorbide mononitrate (IMDUR) 60 MG 24 hr tablet Take 1.5 tablets (90 mg total) by mouth daily. 135 tablet 3  . mometasone-formoterol (DULERA) 200-5 MCG/ACT AERO Inhale 2 puffs into the lungs 2 (two) times daily. 13 g 2  . Multiple Vitamin (MULTIVITAMIN WITH MINERALS) TABS tablet Take 1 tablet by mouth daily.    . naproxen sodium (ALEVE) 220 MG tablet Take 220 mg by mouth daily as needed.    . nitroGLYCERIN (NITROSTAT) 0.4 MG SL tablet PLACE 1 TABLET UNDER TONGUE EVERY 5 MINUTES FOR UP TO 3 DOSES AS NEEDED FOR CHEST PAIN 25 tablet 3  . olmesartan (BENICAR) 5 MG tablet Take 1 tablet (5 mg total) by mouth daily. 90 tablet 3  . omeprazole (PRILOSEC) 40 MG capsule TAKE 1 CAPSULE BY MOUTH TWICE A DAY 180 capsule 3   Current Facility-Administered Medications  Medication Dose Route Frequency Provider Last Rate Last Admin  . Mepolizumab SOLR 100 mg  100 mg Subcutaneous Q28 days Jessica Priest, MD   100 mg at 06/06/20 7824    Allergies:   Oxycontin [oxycodone hcl], Percocet [oxycodone-acetaminophen], and Ambien [zolpidem tartrate]    Social History:  The patient  reports that he quit smoking about 51 years ago. His smoking use included cigarettes. He has a 5.00 pack-year smoking history. He has never used smokeless tobacco. He reports current alcohol use of about 1.0 standard drink of alcohol per week. He reports that he does not use drugs.   Family History:  The patient's family history includes Alzheimer's disease in his father; Cancer in his paternal aunt, paternal uncle, paternal  uncle, and paternal uncle; Dementia in his father; Heart disease in his mother; Heart disease (age of onset: 91) in his brother.    ROS:   Pertinent items noted in HPI and remainder of comprehensive ROS otherwise negative.  PHYSICAL EXAM: VS:  BP 128/84    Pulse 62   Ht 6\' 3"  (1.905 m)   Wt 222 lb 3.2 oz (100.8 kg)   SpO2 95%   BMI 27.77 kg/m  , BMI Body mass index is 27.77 kg/m. General appearance: alert, no distress and Tanned skin Neck: no carotid bruit, no JVD and thyroid not enlarged, symmetric, no tenderness/mass/nodules Lungs: clear to auscultation bilaterally Heart: regular rate and rhythm, S1, S2 normal, no murmur, click, rub or gallop Abdomen: soft, non-tender; bowel sounds normal; no masses,  no organomegaly Extremities: extremities normal, atraumatic, no cyanosis or edema Pulses: 2+ and symmetric Skin: Skin color, texture, turgor normal. No rashes or lesions Neurologic: Grossly normal Psych: Pleasant  EKG:   Normal sinus rhythm 62-personally reviewed  Recent Labs: No results found for requested labs within last 8760 hours.   Lipid Panel    Component Value Date/Time   CHOL 152 11/02/2018 0937   CHOL 136 01/08/2017 0833   TRIG 128 11/02/2018 0937   TRIG 109 01/08/2017 0833   HDL 53 11/02/2018 0937   HDL 53 01/08/2017 0833   CHOLHDL 2.9 11/02/2018 0937   CHOLHDL 2.6 01/08/2017 0833   CHOLHDL 3.0 03/01/2016 0448   VLDL 17 03/01/2016 0448   LDLCALC 73 11/02/2018 0937   LDLCALC 64 01/08/2017 0833     Wt Readings from Last 3 Encounters:  06/25/20 222 lb 3.2 oz (100.8 kg)  05/07/20 223 lb (101.2 kg)  04/12/20 221 lb 9.6 oz (100.5 kg)    ASSESSMENT AND PLAN:  Patient Active Problem List   Diagnosis Date Noted  . Asthma, severe persistent, well-controlled 11/09/2018  . Other allergic rhinitis 11/09/2018  . LPRD (laryngopharyngeal reflux disease) 11/09/2018  . Stomatitis and mucositis 11/09/2018  . Pansinusitis 01/06/2017  . Hyperlipidemia LDL goal <70 07/23/2016  . Anginal chest pain at rest [facility]) 06/17/2016  . CAD in native artery 06/15/2016  . Stented coronary artery 06/15/2016  . Pain in the chest 05/28/2016  . Sinus bradycardia 05/15/2016  . Unstable angina (HCC) 05/14/2016  . CAD (coronary artery  disease), native coronary artery 05/14/2016  . Dyslipidemia 09/28/2013  . GERD (gastroesophageal reflux disease) 09/28/2013  . 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 Moderate persistent chronic asthma with acute exacerbation 06/05/2013  . Essential hypertension 06/05/2013   PLAN: Mr. Brandel seems to be doing well without any recurrent angina after recent increase in his nitrate.  He is overdue for lipid profile which we will reassess.  His main issue is struggling with allergies and asthma.  Blood pressures well controlled.  No changes otherwise to his medications today.  Plan follow-up annually or sooner as necessary.  Renae Gloss, MD, [facility], FACP  [place]  [facility]  Medical Director of the Advanced Lipid Disorders &  Cardiovascular Risk Reduction Clinic Diplomate of the American Board of Clinical Lipidology Attending Cardiologist  Direct Dial: [phone]  Fax: [phone]  Website:  [email or web address]  [phone], MD  06/25/2020 2:11 PM

## 2020-06-25 NOTE — Patient Instructions (Signed)
Medication Instructions:  Your physician recommends that you continue on your current medications as directed. Please refer to the Current Medication list given to you today.  *If you need a refill on your cardiac medications before your next appointment, please call your pharmacy*   Lab Work: FASTING lipid panel to check cholesterol   If you have labs (blood work) drawn today and your tests are completely normal, you will receive your results only by: Marland Kitchen MyChart Message (if you have MyChart) OR . A paper copy in the mail If you have any lab test that is abnormal or we need to change your treatment, we will call you to review the results.   Testing/Procedures: NONE   Follow-Up: At [facility], you and your health needs are our priority.  As part of our continuing mission to provide you with exceptional heart care, we have created designated Provider Care Teams.  These Care Teams include your primary Cardiologist (physician) and Advanced Practice Providers (APPs -  Physician Assistants and Nurse Practitioners) who all work together to provide you with the care you need, when you need it.  We recommend signing up for the patient portal called "MyChart".  Sign up information is provided on this After Visit Summary.  MyChart is used to connect with patients for Virtual Visits (Telemedicine).  Patients are able to view lab/test results, encounter notes, upcoming appointments, etc.  Non-urgent messages can be sent to your provider as well.   To learn more about what you can do with MyChart, go to [email or web address].    Your next appointment:   12 month(s)  The format for your next appointment:   Either In Person or Virtual  Provider:   You may see Chrystie Nose, MD or one of the following Advanced Practice Providers on your designated Care Team:    Azalee Course, PA-C  Micah Flesher, PA-C or   Judy Pimple, [place]    Other Instructions

## 2020-06-26 ENCOUNTER — Encounter: Payer: Self-pay | Admitting: Internal Medicine

## 2020-06-26 LAB — LIPID PANEL
Chol/HDL Ratio: 3.2 ratio (ref 0.0–5.0)
Cholesterol, Total: 173 mg/dL (ref 100–199)
HDL: 54 mg/dL (ref >39)
LDL Chol Calc (NIH): 100 mg/dL — ABNORMAL HIGH (ref 0–99)
Triglycerides: 103 mg/dL (ref 0–149)
VLDL Cholesterol Cal: 19 mg/dL (ref 5–40)

## 2020-07-03 DIAGNOSIS — J4550 Severe persistent asthma, uncomplicated: Secondary | ICD-10-CM | POA: Diagnosis not present

## 2020-07-04 ENCOUNTER — Other Ambulatory Visit: Payer: Self-pay

## 2020-07-04 ENCOUNTER — Ambulatory Visit (INDEPENDENT_AMBULATORY_CARE_PROVIDER_SITE_OTHER): Payer: Medicare Other

## 2020-07-04 DIAGNOSIS — J4550 Severe persistent asthma, uncomplicated: Secondary | ICD-10-CM | POA: Diagnosis not present

## 2020-07-07 ENCOUNTER — Other Ambulatory Visit: Payer: Self-pay | Admitting: Internal Medicine

## 2020-07-31 DIAGNOSIS — J4550 Severe persistent asthma, uncomplicated: Secondary | ICD-10-CM

## 2020-08-01 ENCOUNTER — Other Ambulatory Visit: Payer: Self-pay

## 2020-08-01 ENCOUNTER — Ambulatory Visit (INDEPENDENT_AMBULATORY_CARE_PROVIDER_SITE_OTHER): Payer: Medicare Other

## 2020-08-01 DIAGNOSIS — J4550 Severe persistent asthma, uncomplicated: Secondary | ICD-10-CM | POA: Diagnosis not present

## 2020-08-01 MED ORDER — BUDESONIDE-FORMOTEROL FUMARATE 160-4.5 MCG/ACT IN AERO: 2.0000 | INHALATION_SPRAY | Freq: Two times a day (BID) | RESPIRATORY_TRACT | 2 refills | Status: DC

## 2020-08-01 NOTE — Telephone Encounter (Signed)
Refill for Symbicort 160 mcg x 1 with 2 refills at [facility]

## 2020-08-23 ENCOUNTER — Ambulatory Visit (INDEPENDENT_AMBULATORY_CARE_PROVIDER_SITE_OTHER): Payer: Medicare Other | Admitting: Allergy & Immunology

## 2020-08-23 ENCOUNTER — Other Ambulatory Visit: Payer: Self-pay

## 2020-08-23 ENCOUNTER — Encounter: Payer: Self-pay | Admitting: Allergy & Immunology

## 2020-08-23 VITALS — BP 142/82 | HR 114 | Temp 98.0°F | Resp 18 | Wt 222.0 lb

## 2020-08-23 DIAGNOSIS — J4551 Severe persistent asthma with (acute) exacerbation: Secondary | ICD-10-CM | POA: Diagnosis not present

## 2020-08-23 DIAGNOSIS — I2510 Atherosclerotic heart disease of native coronary artery without angina pectoris: Secondary | ICD-10-CM

## 2020-08-23 DIAGNOSIS — K219 Gastro-esophageal reflux disease without esophagitis: Secondary | ICD-10-CM

## 2020-08-23 DIAGNOSIS — J3089 Other allergic rhinitis: Secondary | ICD-10-CM

## 2020-08-23 MED ORDER — METHYLPREDNISOLONE ACETATE 80 MG/ML IJ SUSP
80.0000 mg | Freq: Once | INTRAMUSCULAR | Status: AC
  Administered 2020-08-23: 80 mg via INTRAMUSCULAR

## 2020-08-23 NOTE — Progress Notes (Signed)
FOLLOW UP  Date of Service/Encounter:  08/23/20   Assessment:   Severe persistent asthma, with acute exacerbation  Allergic rhinitis - needs to use the nasal atrovent more often  LPRD (laryngopharyngeal reflux disease)  Plan/Recommendations:   1. Severe persistent asthma, uncomplicated - We did not do lung testing today since you were sick. - We did give you a DepoMedrol injection. - Start the prednisone dose pack provided. - Try using the Xopenex nebulizer solution instead of your current albuterol nebulizer solution (samples provided). - Let us know if this medication causes those side effects and we can send in a prescription for you.  - Daily controller medication(s): Breztri 160mg  two puffs twice daily with spacer + Nucala 100 mg monthly - Prior to physical activity: albuterol 2 puffs 10-15 minutes before physical activity. - Rescue medications: albuterol 4 puffs every 4-6 hours as needed - Asthma control goals:  * Full participation in all desired activities (may need albuterol before activity) * Albuterol use two time or less a week on average (not counting use with activity) * Cough interfering with sleep two time or less a month * Oral steroids no more than once a year * No hospitalizations  2. Allergic rhinitis - Continue with nasal ipratropium one spray per nostril up to four tines daily as needed for runny noses.  - Try to the nose spray at least twice daily to see if this can help with the hoarseness.   3. LPRD (laryngopharyngeal reflux disease) - Continue with the use of the omeprazole and Pepcid.  4. Follow up as scheduled.   Subjective:   Louis Perez is a 69 y.o. male presenting today for follow up of  Chief Complaint  Patient presents with  . Shortness of Breath    wheezing  . Sinusitis    runny nose, coughing up phlegm (yellow)    78 has a history of the following: Patient Active Problem List   Diagnosis Date  Noted  . Asthma, severe persistent, well-controlled 11/09/2018  . Other allergic rhinitis 11/09/2018  . LPRD (laryngopharyngeal reflux disease) 11/09/2018  . Stomatitis and mucositis 11/09/2018  . Pansinusitis 01/06/2017  . Hyperlipidemia LDL goal <70 07/23/2016  . Anginal chest pain at rest [facility]) 06/17/2016  . CAD in native artery 06/15/2016  . Stented coronary artery 06/15/2016  . Pain in the chest 05/28/2016  . Sinus bradycardia 05/15/2016  . Unstable angina (HCC) 05/14/2016  . CAD (coronary artery disease), native coronary artery 05/14/2016  . Dyslipidemia 09/28/2013  . GERD (gastroesophageal reflux disease) 09/28/2013  . 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 Moderate persistent chronic asthma with acute exacerbation 06/05/2013  . Essential hypertension 06/05/2013    History obtained from: chart review and patient.  Louis Perez is a 69 y.o. male presenting for a sick visit.  He was last seen in May 2021 for an asthma exacerbation.  His lung testing at the time was in the 50% range but it did improve with the nebulizer treatment.  We stopped his Symbicort and started Breztri 2 puffs twice daily.  We also started him on prednisone.  For his allergic rhinitis, we continued with ipratropium 1 spray per nostril up to 4 times daily as needed.  We also continued with omeprazole and Pepcid for his reflux.  Since last visit, he has mostly done well.  However, over the last 2 weeks, he has had worsening coughing and wheezing.  He has been able to keep up his physical activity.  He  names a number of different golf courses where he has played recently.   He is having cough and congestion. He denies COVID exposures. He started having coughing a couple of weeks ago. He reports that he has coughing and wheezing that has gotten worse over time. He denies fevers.   Asthma/Respiratory Symptom History: He remains on Symbicort two puffs BID. He has a nebulizer at home and has been using that fairly routinely. He has  not done it in a couple of days. He denies fevers, chest pain, and nothing out of the ordinary. It has been slowly improving over time.    Allergic Rhinitis Symptom History: He does have the nose spray, but he forgets to take it. He does not even take it once per day. He continues to endorse hoarseness. He is on GERD medications and he is good about taking these.   Otherwise, there have been no changes to his past medical history, surgical history, family history, or social history.    Review of Systems  Constitutional: Negative.  Negative for chills, fever, malaise/fatigue and weight loss.  HENT: Negative.  Negative for congestion, ear discharge and ear pain.   Eyes: Negative for pain, discharge and redness.  Respiratory: Positive for cough and shortness of breath. Negative for sputum production and wheezing.   Cardiovascular: Negative.  Negative for chest pain and palpitations.  Gastrointestinal: Negative for abdominal pain, heartburn, nausea and vomiting.  Skin: Negative.  Negative for itching and rash.  Neurological: Negative for dizziness and headaches.  Endo/Heme/Allergies: Negative for environmental allergies. Does not bruise/bleed easily.       Objective:   Blood pressure (!) 142/82, pulse (!) 114, temperature 98 F (36.7 C), temperature source Temporal, resp. rate 18, weight 222 lb (100.7 kg), SpO2 94 %. Body mass index is 27.75 kg/m.   Physical Exam:  Physical Exam Constitutional:      Appearance: He is well-developed.  HENT:     Head: Normocephalic and atraumatic.     Right Ear: Tympanic membrane, ear canal and external ear normal.     Left Ear: Tympanic membrane, ear canal and external ear normal.     Nose: No nasal deformity, septal deviation, mucosal edema or rhinorrhea.     Right Turbinates: Enlarged and swollen.     Left Turbinates: Enlarged and swollen.     Right Sinus: No maxillary sinus tenderness or frontal sinus tenderness.     Left Sinus: No maxillary  sinus tenderness or frontal sinus tenderness.     Mouth/Throat:     Mouth: Mucous membranes are not pale and not dry.     Pharynx: Uvula midline.     Comments: Cobblestoning present in the posterior oropharynx.  Eyes:     General:        Right eye: No discharge.        Left eye: No discharge.     Conjunctiva/sclera: Conjunctivae normal.     Right eye: Right conjunctiva is not injected. No chemosis.    Left eye: Left conjunctiva is not injected. No chemosis.    Pupils: Pupils are equal, round, and reactive to light.  Cardiovascular:     Rate and Rhythm: Normal rate and regular rhythm.     Heart sounds: Normal heart sounds.  Pulmonary:     Effort: Pulmonary effort is normal. No tachypnea, accessory muscle usage or respiratory distress.     Breath sounds: Normal breath sounds. No wheezing, rhonchi or rales.     Comments: Actually moving air  well in all lung fields. No increased work of breathing noted. He is slightly tachypneic.  Chest:     Chest wall: No tenderness.  Lymphadenopathy:     Cervical: No cervical adenopathy.  Skin:    Coloration: Skin is not pale.     Findings: No abrasion, erythema, petechiae or rash. Rash is not papular, urticarial or vesicular.     Comments: No eczematous or urticarial lesions noted.   Neurological:     Mental Status: He is alert.      Diagnostic studies: none     Louis Bonds, MD  [facility]

## 2020-08-23 NOTE — Patient Instructions (Signed)
1. Severe persistent asthma, uncomplicated - We did not do lung testing today since you were sick. - We did give you a DepoMedrol injection. - Start the prednisone dose pack provided. - Try using the Xopenex nebulizer solution instead of your current albuterol nebulizer solution (samples provided). - Let us know if this medication causes those side effects and we can send in a prescription for you.  - Daily controller medication(s): Breztri 160mg  two puffs twice daily with spacer + Nucala 100 mg monthly - Prior to physical activity: albuterol 2 puffs 10-15 minutes before physical activity. - Rescue medications: albuterol 4 puffs every 4-6 hours as needed - Asthma control goals:  * Full participation in all desired activities (may need albuterol before activity) * Albuterol use two time or less a week on average (not counting use with activity) * Cough interfering with sleep two time or less a month * Oral steroids no more than once a year * No hospitalizations  2. Allergic rhinitis - Continue with nasal ipratropium one spray per nostril up to four tines daily as needed for runny noses.  - Try to the nose spray at least twice daily to see if this can help with the hoarseness.   3. LPRD (laryngopharyngeal reflux disease) - Continue with the use of the omeprazole and Pepcid.  4. Follow up as scheduled.    Please inform of any Emergency Department visits, hospitalizations, or changes in symptoms. Call us before going to the ED for breathing or allergy symptoms since we might be able to fit you in for a sick visit. Feel free to contact us anytime with any questions, problems, or concerns.  It was a pleasure to see you again today!  Websites that have reliable patient information: 1. American Academy of Asthma, Allergy, and Immunology: www.aaaai.org 2. Food Allergy Research and Education (FARE): foodallergy.org 3. Mothers of Asthmatics: http://www.asthmacommunitynetwork.org 4. American  College of Allergy, Asthma, and Immunology: www.acaai.org   COVID-19 Vaccine Information can be found at: [place] For questions related to vaccine distribution or appointments, please email [email or web address] or call [phone].     "Like" [phone] on Facebook and Instagram for our latest updates!        Make sure you are registered to vote! If you have moved or changed any of your contact information, you will need to get this updated before voting!  In some cases, you MAY be able to register to vote online: [place]

## 2020-08-28 DIAGNOSIS — J4550 Severe persistent asthma, uncomplicated: Secondary | ICD-10-CM | POA: Diagnosis not present

## 2020-08-29 ENCOUNTER — Ambulatory Visit (INDEPENDENT_AMBULATORY_CARE_PROVIDER_SITE_OTHER): Payer: Medicare Other

## 2020-08-29 ENCOUNTER — Other Ambulatory Visit: Payer: Self-pay

## 2020-08-29 DIAGNOSIS — J4550 Severe persistent asthma, uncomplicated: Secondary | ICD-10-CM

## 2020-09-03 ENCOUNTER — Ambulatory Visit: Payer: Medicare Other

## 2020-09-16 ENCOUNTER — Other Ambulatory Visit: Payer: Self-pay | Admitting: Allergy & Immunology

## 2020-09-25 DIAGNOSIS — J4550 Severe persistent asthma, uncomplicated: Secondary | ICD-10-CM | POA: Diagnosis not present

## 2020-09-26 ENCOUNTER — Ambulatory Visit (INDEPENDENT_AMBULATORY_CARE_PROVIDER_SITE_OTHER): Payer: Medicare Other

## 2020-09-26 ENCOUNTER — Other Ambulatory Visit: Payer: Self-pay

## 2020-09-26 DIAGNOSIS — J4550 Severe persistent asthma, uncomplicated: Secondary | ICD-10-CM | POA: Diagnosis not present

## 2020-10-23 DIAGNOSIS — J4550 Severe persistent asthma, uncomplicated: Secondary | ICD-10-CM | POA: Diagnosis not present

## 2020-10-24 ENCOUNTER — Ambulatory Visit (INDEPENDENT_AMBULATORY_CARE_PROVIDER_SITE_OTHER): Payer: Medicare Other

## 2020-10-24 ENCOUNTER — Other Ambulatory Visit: Payer: Self-pay

## 2020-10-24 DIAGNOSIS — J4550 Severe persistent asthma, uncomplicated: Secondary | ICD-10-CM | POA: Diagnosis not present

## 2020-11-12 ENCOUNTER — Ambulatory Visit (INDEPENDENT_AMBULATORY_CARE_PROVIDER_SITE_OTHER): Payer: Medicare Other | Admitting: Allergy & Immunology

## 2020-11-12 ENCOUNTER — Encounter: Payer: Self-pay | Admitting: Allergy & Immunology

## 2020-11-12 ENCOUNTER — Other Ambulatory Visit: Payer: Self-pay

## 2020-11-12 VITALS — BP 108/78 | HR 64 | Temp 97.3°F | Resp 18 | Ht 72.0 in | Wt 217.6 lb

## 2020-11-12 DIAGNOSIS — J4550 Severe persistent asthma, uncomplicated: Secondary | ICD-10-CM | POA: Diagnosis not present

## 2020-11-12 DIAGNOSIS — J3089 Other allergic rhinitis: Secondary | ICD-10-CM | POA: Diagnosis not present

## 2020-11-12 DIAGNOSIS — K219 Gastro-esophageal reflux disease without esophagitis: Secondary | ICD-10-CM | POA: Diagnosis not present

## 2020-11-12 DIAGNOSIS — I2510 Atherosclerotic heart disease of native coronary artery without angina pectoris: Secondary | ICD-10-CM

## 2020-11-12 MED ORDER — PANTOPRAZOLE SODIUM 40 MG PO TBEC: 40.0000 mg | DELAYED_RELEASE_TABLET | Freq: Two times a day (BID) | ORAL | 5 refills | Status: DC

## 2020-11-12 MED ORDER — BUDESONIDE-FORMOTEROL FUMARATE 160-4.5 MCG/ACT IN AERO: 2.0000 | INHALATION_SPRAY | Freq: Two times a day (BID) | RESPIRATORY_TRACT | 2 refills | Status: DC

## 2020-11-12 NOTE — Progress Notes (Signed)
FOLLOW UP  Date of Service/Encounter:  11/12/20   Assessment:   Severe persistent asthma,with acute exacerbation  Allergic rhinitis - needs to use the nasal atrovent more often  LPRD (laryngopharyngeal reflux disease)- changing medications today to try to address worsening hoarseness at night   Plan/Recommendations:   1. Severe persistent asthma, uncomplicated - Lung testing looked awesome today.  - We are not going to make any medication changes at this time.  - Daily controller medication(s): Symbicort 160mg  two puffs twice daily with spacer + Nucala 100 mg monthly - Prior to physical activity: albuterol 2 puffs 10-15 minutes before physical activity. - Rescue medications: albuterol 4 puffs every 4-6 hours as needed - Asthma control goals:  * Full participation in all desired activities (may need albuterol before activity) * Albuterol use two time or less a week on average (not counting use with activity) * Cough interfering with sleep two time or less a month * Oral steroids no more than once a year * No hospitalizations  2. Allergic rhinitis - Continue with nasal ipratropium one spray per nostril up to four tines daily as needed for runny noses.  - We could send you somewhere else for an evaluation if you would like, but it is seems like you have already seen just about everybody.   3. LPRD (laryngopharyngeal reflux disease) - Stop the omeprazole and start pantoprazole 40mg  twice daily. - Continue with Pepcid twice daily.   4. Follow up in six months or earlier if needed.    Subjective:   Louis Perez is a 69 y.o. male presenting today for follow up of  Chief Complaint  Patient presents with  . Follow-up  . Sinus Problem    Louis Perez has a history of the following: Patient Active Problem List   Diagnosis Date Noted  . Asthma, severe persistent, well-controlled 11/09/2018  . Other allergic rhinitis 11/09/2018  . LPRD (laryngopharyngeal  reflux disease) 11/09/2018  . Stomatitis and mucositis 11/09/2018  . Pansinusitis 01/06/2017  . Hyperlipidemia LDL goal <70 07/23/2016  . Anginal chest pain at rest [facility]) 06/17/2016  . CAD in native artery 06/15/2016  . Stented coronary artery 06/15/2016  . Pain in the chest 05/28/2016  . Sinus bradycardia 05/15/2016  . Unstable angina (HCC) 05/14/2016  . CAD (coronary artery disease), native coronary artery 05/14/2016  . Dyslipidemia 09/28/2013  . GERD (gastroesophageal reflux disease) 09/28/2013  . 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 Moderate persistent chronic asthma with acute exacerbation 06/05/2013  . Essential hypertension 06/05/2013    History obtained from: chart review and patient.  Louis Perez is a 69 y.o. male presenting for a follow up visit. She was last seen in September 2021 for a sick visit. At that time, we did have to treat him with a DepoMedrol injection. We sent in Xopenex nebulizer solution to use on a PRN basis. We did not make changes to his medication regimen other than changing to [facility] in lieu of Symbicort.   Since the last visit, he has mostly done well.   Asthma/Respiratory Symptom History: He remains on the Symbicort two puffs BID. He has been using his spacer. He did feel better after that last injection. He requires them typically around every 6 months or so. He thinks that he has done well since the last well.   Allergic Rhinitis Symptom History: He does have ear fullness on the right that lasts 2-12 hours or so. He does have some ear popping when he blows his nose.  He does report some hoarseness. This is worse in the mornings and the afternoons. It comes and goes. He is on the Pepcid and the Prilosec. He does have hoarseness that worsens when he is taking. It gets worse at night and when he is laying down.   He was seeing Dr. Haroldine Laws for his ear issues. He never did surgery and he has been "everywhere" for it. He has been to [facility] physicians. He has also been to  a doctor at [facility] without improvement in his ears. He would have some headaches with it and have some eye fluttering with some problems focusing. He has never had migraines to his knowledge. He is learning to live with these problems and is not really interested in another referral.   Otherwise, there have been no changes to his past medical history, surgical history, family history, or social history.    Review of Systems  Constitutional: Negative.  Negative for chills, fever, malaise/fatigue and weight loss.  HENT: Positive for congestion and sinus pain. Negative for ear discharge and ear pain.   Eyes: Negative for pain, discharge and redness.  Respiratory: Negative for cough, sputum production, shortness of breath and wheezing.   Cardiovascular: Negative.  Negative for chest pain and palpitations.  Gastrointestinal: Negative for abdominal pain, constipation, diarrhea, heartburn, nausea and vomiting.  Skin: Negative.  Negative for itching and rash.  Neurological: Negative for dizziness and headaches.  Endo/Heme/Allergies: Positive for environmental allergies. Does not bruise/bleed easily.       Objective:   Blood pressure 108/78, pulse 64, temperature (!) 97.3 F (36.3 C), temperature source Temporal, resp. rate 18, height 6' (1.829 m), weight 217 lb 9.6 oz (98.7 kg), SpO2 95 %. Body mass index is 29.51 kg/m.   Physical Exam:  Physical Exam Constitutional:      Appearance: He is well-developed.     Comments: Pleasant male.  Cooperative with the exam.  HENT:     Head: Normocephalic and atraumatic.     Right Ear: Tympanic membrane, ear canal and external ear normal.     Left Ear: Tympanic membrane, ear canal and external ear normal.     Nose: No nasal deformity, septal deviation, mucosal edema or rhinorrhea.     Right Turbinates: Enlarged and swollen.     Left Turbinates: Enlarged and swollen.     Right Sinus: No maxillary sinus tenderness or frontal sinus tenderness.      Left Sinus: No maxillary sinus tenderness or frontal sinus tenderness.     Mouth/Throat:     Mouth: Mucous membranes are not pale and not dry.     Pharynx: Uvula midline.  Eyes:     General:        Right eye: No discharge.        Left eye: No discharge.     Conjunctiva/sclera: Conjunctivae normal.     Right eye: Right conjunctiva is not injected. No chemosis.    Left eye: Left conjunctiva is not injected. No chemosis.    Pupils: Pupils are equal, round, and reactive to light.  Cardiovascular:     Rate and Rhythm: Normal rate and regular rhythm.     Heart sounds: Normal heart sounds.  Pulmonary:     Effort: Pulmonary effort is normal. No tachypnea, accessory muscle usage or respiratory distress.     Breath sounds: Normal breath sounds. No wheezing, rhonchi or rales.     Comments: Moving air well in all lung fields.  No increased work of breathing. Chest:  Chest wall: No tenderness.  Lymphadenopathy:     Cervical: No cervical adenopathy.  Skin:    Coloration: Skin is not pale.     Findings: No abrasion, erythema, petechiae or rash. Rash is not papular, urticarial or vesicular.     Comments: No eczematous or urticarial lesions noted.  Neurological:     Mental Status: He is alert.      Diagnostic studies:    Spirometry: results normal (FEV1: 2.50/70%, FVC: 3.23/67%, FEV1/FVC: 77%).    Spirometry consistent with possible restrictive disease, although it is stable compared to previous spirometric findings.   Allergy Studies: none       Louis Bonds, MD  [facility]

## 2020-11-12 NOTE — Patient Instructions (Addendum)
1. Severe persistent asthma, uncomplicated - Lung testing looked awesome today.  - We are not going to make any medication changes at this time.  - Daily controller medication(s): Symbicort 160mg  two puffs twice daily with spacer + Nucala 100 mg monthly - Prior to physical activity: albuterol 2 puffs 10-15 minutes before physical activity. - Rescue medications: albuterol 4 puffs every 4-6 hours as needed - Asthma control goals:  * Full participation in all desired activities (may need albuterol before activity) * Albuterol use two time or less a week on average (not counting use with activity) * Cough interfering with sleep two time or less a month * Oral steroids no more than once a year * No hospitalizations  2. Allergic rhinitis - Continue with nasal ipratropium one spray per nostril up to four tines daily as needed for runny noses.  - We could send you somewhere else for an evaluation if you would like, but it is seems like you have already seen just about everybody.   3. LPRD (laryngopharyngeal reflux disease) - Stop the omeprazole and start pantoprazole 40mg  twice daily. - Continue with Pepcid twice daily.   4. Follow up in six months or earlier if needed.     Please inform of any Emergency Department visits, hospitalizations, or changes in symptoms. Call before going to the ED for breathing or allergy symptoms since we might be able to fit you in for a sick visit. Feel free to contact us anytime with any questions, problems, or concerns.  It was a pleasure to see you again today!  Websites that have reliable patient information: 1. American Academy of Asthma, Allergy, and Immunology: www.aaaai.org 2. Food Allergy Research and Education (FARE): foodallergy.org 3. Mothers of Asthmatics: http://www.asthmacommunitynetwork.org 4. American College of Allergy, Asthma, and Immunology: www.acaai.org   COVID-19 Vaccine Information can be found at:  [place] For questions related to vaccine distribution or appointments, please email [email or web address] or call [phone].     "Like" [email or web address] on Facebook and Instagram for our latest updates!      Make sure you are registered to vote! If you have moved or changed any of your contact information, you will need to get this updated before voting!  In some cases, you MAY be able to register to vote online: [phone]

## 2020-11-20 DIAGNOSIS — J4550 Severe persistent asthma, uncomplicated: Secondary | ICD-10-CM | POA: Diagnosis not present

## 2020-11-21 ENCOUNTER — Ambulatory Visit (INDEPENDENT_AMBULATORY_CARE_PROVIDER_SITE_OTHER): Payer: Medicare Other

## 2020-11-21 ENCOUNTER — Other Ambulatory Visit: Payer: Self-pay

## 2020-11-21 DIAGNOSIS — J4550 Severe persistent asthma, uncomplicated: Secondary | ICD-10-CM

## 2020-11-25 ENCOUNTER — Ambulatory Visit
Admission: RE | Admit: 2020-11-25 | Discharge: 2020-11-25 | Disposition: A | Discharge status: Home / Self Care | Payer: Medicare Other | Source: Ambulatory Visit | Attending: Allergy | Admitting: Allergy

## 2020-11-25 ENCOUNTER — Ambulatory Visit (INDEPENDENT_AMBULATORY_CARE_PROVIDER_SITE_OTHER): Payer: Medicare Other | Admitting: Allergy

## 2020-11-25 ENCOUNTER — Telehealth: Payer: Self-pay

## 2020-11-25 ENCOUNTER — Encounter: Payer: Self-pay | Admitting: Allergy

## 2020-11-25 ENCOUNTER — Other Ambulatory Visit: Payer: Self-pay

## 2020-11-25 VITALS — BP 110/80 | HR 68 | Temp 97.6°F | Resp 18 | Ht 75.0 in | Wt 219.6 lb

## 2020-11-25 DIAGNOSIS — J4551 Severe persistent asthma with (acute) exacerbation: Secondary | ICD-10-CM | POA: Diagnosis not present

## 2020-11-25 DIAGNOSIS — R0602 Shortness of breath: Secondary | ICD-10-CM

## 2020-11-25 DIAGNOSIS — J069 Acute upper respiratory infection, unspecified: Secondary | ICD-10-CM

## 2020-11-25 DIAGNOSIS — K219 Gastro-esophageal reflux disease without esophagitis: Secondary | ICD-10-CM | POA: Diagnosis not present

## 2020-11-25 DIAGNOSIS — J3089 Other allergic rhinitis: Secondary | ICD-10-CM | POA: Diagnosis not present

## 2020-11-25 NOTE — Assessment & Plan Note (Addendum)
Asthma flare since last week and slowly improving. Denies fevers/chills. Wife is sick now as well.   Based on clinical history and timeline, recommend that patient get COVID-19 testing and quarantine until results are back.  Check pulse oxygen level. If it's consistently below 90%, please go to ER/urgent care for further evaluation.   May take Mucinex twice a day to loosen up mucous. Take with plenty of water - gave handout on URIs.  Start prednisone 40mg  daily x 2 days, 30mg  daily x 2 days, 20mg  daily x 2 days and 10mg  daily x 2 days.  Use albuterol nebulizer three times a day (before using Symbicort) for the next 7 days.  . Daily controller medication(s): continue Symbicort 2 puffs twice a day with spacer and rinse mouth afterwards. o Continue Nucala injections. . May use albuterol rescue inhaler 2 puffs every 4 to 6 hours as needed for shortness of breath, chest tightness, coughing, and wheezing. May use albuterol rescue inhaler 2 puffs 5 to 15 minutes prior to strenuous physical activities. Monitor frequency of use.   Get chest Xray today - may need antibiotics depending on results.  Will get spirometry at next visit instead of today due to COVID-19 pandemic and trying to minimize any type of aerosolizing procedures at this time in the office.

## 2020-11-25 NOTE — Telephone Encounter (Signed)
Patient called stating he has had a cough and SOB for over a week. Is currently doing his current regimen medications but states no improvements. Received his Nucala injection last Thursday and not improving with his SOB either. No fevers, nausea, vomiting, or chills. States he needs to be seen and listen since he is wheezing and coughing up mucus.

## 2020-11-25 NOTE — Assessment & Plan Note (Signed)
   May use over the counter antihistamines such as Zyrtec (cetirizine), Claritin (loratadine), Allegra (fexofenadine), or Xyzal (levocetirizine) daily as needed.  Continue with ipratropium nasal spray as needed.

## 2020-11-25 NOTE — Assessment & Plan Note (Signed)
Stable.  Continue with pantoprazole 40mg  twice a day and famotidine twice a day.

## 2020-11-25 NOTE — Progress Notes (Signed)
Follow Up Note  RE: Louis Perez MRN: [id number] DOB: June 15, 1951 Date of Office Visit: 11/25/2020  Referring provider: Roe Coombs* Primary care provider: [facility]  Chief Complaint: Asthma  History of Present Illness: I had the pleasure of seeing Louis Perez for a follow up visit at the [facility] on 11/25/2020. He is a 69 y.o. male, who is being followed for asthma on Nucala, allergic rhinitis, LPRD. His previous allergy office visit was on 11/12/2020 with Dr. Dellis Anes. Today is a new complaint visit of asthma flare.  Asthma:  Patient developed some sore throat last Monday which improved by Wednesday then he started to cough up yellowish/green phlegm with wheezing. Symptoms slowly improving.  Denies any fevers/chills. No sick contacts but over the weekend his wife started to exhibit similar symptoms.   Patient got COVID-19 booster 2 weeks ago.   Currently using Symbicort 2 puffs twice a day with spacer and rinsing mouth afterwards. He is also on Nucala injection. Using albuterol nebulizer 2-3 times a day with good benefit.   No recent ER/urgent care visit or prednisone use.  Taking OTC Mucinex twice a day. Took albuterol nebulizer around 8:30AM this morning.   Allergic rhinitis Increased nasal symptoms. Using ipratropium 2-3 times a day. Taking OTC cough medicine.   LPRD (laryngopharyngeal reflux disease) Stable with pantoprazole 40mg  twice daily and Pepcid twice daily.   Assessment and Plan: Louis Perez is a 69 y.o. male with: Severe persistent asthma with acute exacerbation Asthma flare since last week and slowly improving. Denies fevers/chills. Wife is sick now as well.   Based on clinical history and timeline, recommend that patient get COVID-19 testing and quarantine until results are back.  Check pulse oxygen level. If it's consistently below 90%, please go to ER/urgent care for  further evaluation.   May take Mucinex twice a day to loosen up mucous. Take with plenty of water - gave handout on URIs.  Start prednisone 40mg  daily x 2 days, 30mg  daily x 2 days, 20mg  daily x 2 days and 10mg  daily x 2 days.  Use albuterol nebulizer three times a day (before using Symbicort) for the next 7 days.  . Daily controller medication(s): continue Symbicort 78 2 puffs twice a day with spacer and rinse mouth afterwards. o Continue Nucala injections. . May use albuterol rescue inhaler 2 puffs every 4 to 6 hours as needed for shortness of breath, chest tightness, coughing, and wheezing. May use albuterol rescue inhaler 2 puffs 5 to 15 minutes prior to strenuous physical activities. Monitor frequency of use.   Get chest Xray today - may need antibiotics depending on results.  Will get spirometry at next visit instead of today due to COVID-19 pandemic and trying to minimize any type of aerosolizing procedures at this time in the office.   Other allergic rhinitis  May use over the counter antihistamines such as Zyrtec (cetirizine), Claritin (loratadine), Allegra (fexofenadine), or Xyzal (levocetirizine) daily as needed.  Continue with ipratropium nasal spray as needed.  LPRD (laryngopharyngeal reflux disease) Stable.  Continue with pantoprazole 40mg  twice a day and famotidine twice a day.  Return in about 3 months (around 02/23/2021).  Diagnostics: None.   Medication List:  Current Outpatient Medications  Medication Sig Dispense Refill  . albuterol (PROAIR HFA) 108 (90 Base) MCG/ACT inhaler Inhale 4 puffs into the lungs every 6 (six) hours as needed for wheezing or shortness of breath. 18 g 2  . aspirin 81  MG tablet Take 81 mg by mouth at bedtime.     Marland Kitchen atorvastatin (LIPITOR) 80 MG tablet TAKE 1 TABLET BY MOUTH ONCE DAILY AT  6  PM 90 tablet 2  . budesonide-formoterol (SYMBICORT) 160-4.5 MCG/ACT inhaler Inhale 2 puffs into the lungs 2 (two) times daily. Rinse, gargle and  spit out after use. 1 each 2  . carvedilol (COREG) 3.125 MG tablet Take 1 tablet (3.125 mg total) by mouth 2 (two) times daily. 180 tablet 3  . cyproheptadine (PERIACTIN) 4 MG tablet TAKE 1 TABLET BY MOUTH TWICE DAILY AS NEEDED FOR ALLERGIES 60 tablet 1  . famotidine (PEPCID) 20 MG tablet Take 2 tablets by mouth once daily 180 tablet 2  . isosorbide mononitrate (IMDUR) 60 MG 24 hr tablet Take 1.5 tablets (90 mg total) by mouth daily. 135 tablet 3  . Multiple Vitamin (MULTIVITAMIN WITH MINERALS) TABS tablet Take 1 tablet by mouth daily.    . naproxen sodium (ALEVE) 220 MG tablet Take 220 mg by mouth daily as needed.    . nitroGLYCERIN (NITROSTAT) 0.4 MG SL tablet PLACE 1 TABLET UNDER TONGUE EVERY 5 MINUTES FOR UP TO 3 DOSES AS NEEDED FOR CHEST PAIN 25 tablet 3  . olmesartan (BENICAR) 5 MG tablet Take 1 tablet (5 mg total) by mouth daily. 90 tablet 3  . omeprazole (PRILOSEC) 40 MG capsule Take 1 capsule by mouth twice daily 180 capsule 3  . pantoprazole (PROTONIX) 40 MG tablet Take 1 tablet (40 mg total) by mouth 2 (two) times daily. 60 tablet 5   Current Facility-Administered Medications  Medication Dose Route Frequency Provider Last Rate Last Admin  . Mepolizumab SOLR 100 mg  100 mg Subcutaneous Q28 days Jessica Priest, MD   100 mg at 11/21/20 6045   Allergies: Allergies  Allergen Reactions  . Oxycontin [Oxycodone Hcl] Anaphylaxis  . Percocet [Oxycodone-Acetaminophen] Anaphylaxis    Tolerates Vicodin  . Ambien [Zolpidem Tartrate] Other (See Comments)    Mood changes    I reviewed his past medical history, social history, family history, and environmental history and no significant changes have been reported from his previous visit.  Review of Systems  Constitutional: Negative for appetite change, chills, fever and unexpected weight change.  HENT: Positive for congestion, rhinorrhea and sore throat.   Eyes: Negative for itching.  Respiratory: Positive for cough, chest tightness,  shortness of breath and wheezing.   Gastrointestinal: Negative for abdominal pain.  Skin: Negative for rash.  Allergic/Immunologic: Positive for environmental allergies.  Neurological: Negative for headaches.   Objective: BP 110/80 (BP Location: Left Arm, Patient Position: Sitting, Cuff Size: Normal)   Pulse 68   Temp 97.6 F (36.4 C) (Temporal)   Resp 18   Ht 6\' 3"  (1.905 m)   Wt 219 lb 9.6 oz (99.6 kg)   SpO2 90%   BMI 27.45 kg/m  Body mass index is 27.45 kg/m. Physical Exam Vitals and nursing note reviewed.  Constitutional:      Appearance: Normal appearance. He is well-developed.  HENT:     Head: Normocephalic and atraumatic.     Right Ear: Tympanic membrane and external ear normal.     Left Ear: Tympanic membrane and external ear normal.     Nose: Nose normal.     Mouth/Throat:     Mouth: Mucous membranes are moist.     Pharynx: Oropharynx is clear.  Eyes:     Conjunctiva/sclera: Conjunctivae normal.  Cardiovascular:     Rate and Rhythm: Normal rate  and regular rhythm.     Heart sounds: Normal heart sounds. No murmur heard.   Pulmonary:     Effort: Pulmonary effort is normal.     Breath sounds: Rales (on right lower lobe) present. No wheezing or rhonchi.  Musculoskeletal:     Cervical back: Neck supple.  Skin:    General: Skin is warm.     Findings: No rash.  Neurological:     Mental Status: He is alert and oriented to person, place, and time.  Psychiatric:        Behavior: Behavior normal.    Previous notes and tests were reviewed. The plan was reviewed with the patient/family, and all questions/concerned were addressed.  It was my pleasure to see Louis Perez today and participate in his care. Please feel free to contact me with any questions or concerns.  Sincerely,  Wyline Mood, DO Allergy & Immunology  [facility] office: [phone] [facility] office: [phone]

## 2020-11-25 NOTE — Telephone Encounter (Signed)
Scheduled today

## 2020-11-25 NOTE — Patient Instructions (Addendum)
Please get COVID-19 testing and quarantine until results are back.   Checks [facility] or [facility].  Check your pulse oxygen level. If it's consistently below 90%, please go to ER/urgent care for further evaluation.   May take Mucinex twice a day to loosen up mucous. Take with plenty of water.    Start prednisone 40mg  daily x 2 days, 30mg  daily x 2 days, 20mg  daily x 2 days and 10mg  daily x 2 days.  Use albuterol nebulizer three times a day (before using your Symbicort) for the next 7 days.  . Daily controller medication(s): continue Symbicort 2 puffs twice a day with spacer and rinse mouth afterwards. . May use albuterol rescue inhaler 2 puffs every 4 to 6 hours as needed for shortness of breath, chest tightness, coughing, and wheezing. May use albuterol rescue inhaler 2 puffs 5 to 15 minutes prior to strenuous physical activities. Monitor frequency of use.  . Asthma control goals:  o Full participation in all desired activities (may need albuterol before activity) o Albuterol use two times or less a week on average (not counting use with activity) o Cough interfering with sleep two times or less a month o Oral steroids no more than once a year o No hospitalizations  Get chest Xray today - may need antibiotics depending on results.  Allergic rhinitis   May use over the counter antihistamines such as Zyrtec (cetirizine), Claritin (loratadine), Allegra (fexofenadine), or Xyzal (levocetirizine) daily as needed.  Continue with ipratropium nasal spray as needed.  Reflux  Continue with pantoprazole 40mg  twice a day and famotidine twice a day.  Follow up with Dr. as scheduled.    Drink plenty of fluids.  Water, juice, clear broth or warm lemon water are good choices. Avoid caffeine and alcohol, which can dehydrate you.  Eat chicken soup.  Chicken soup and other warm fluids can be soothing and loosen congestion.  Rest.  Adjust your room's temperature and  humidity.  Keep your room warm but not overheated. If the air is dry, a cool-mist humidifier or vaporizer can moisten the air and help ease congestion and coughing. Keep the humidifier clean to prevent the growth of bacteria and molds.  Soothe your throat.  Perform a saltwater gargle. Dissolve one-quarter to a half teaspoon of salt in a 4- to 8-ounce glass of warm water. This can relieve a sore or scratchy throat temporarily.  Use saline nasal drops.  To help relieve nasal congestion, try saline nasal drops. You can buy these drops over the counter, and they can help relieve symptoms ? even in children.  Take over-the-counter cold and cough medications.  For adults and children older than 5, over-the-counter decongestants, antihistamines and pain relievers might offer some symptom relief. However, they won't prevent a cold or shorten its duration.

## 2020-11-29 ENCOUNTER — Other Ambulatory Visit: Payer: Self-pay

## 2020-11-29 ENCOUNTER — Telehealth: Payer: Self-pay | Admitting: Allergy & Immunology

## 2020-11-29 MED ORDER — ALBUTEROL SULFATE (2.5 MG/3ML) 0.083% IN NEBU: 2.5000 mg | INHALATION_SOLUTION | RESPIRATORY_TRACT | 1 refills | Status: DC | PRN

## 2020-11-29 NOTE — Telephone Encounter (Signed)
Patient is requesting a refill for Nebulizer Solution. [facility] in [place]. Last seen 11/25/20.

## 2020-11-29 NOTE — Telephone Encounter (Signed)
Albuterol neb solution sent in to pts pharmacy

## 2020-12-01 ENCOUNTER — Other Ambulatory Visit: Payer: Self-pay | Admitting: Allergy & Immunology

## 2020-12-02 ENCOUNTER — Telehealth: Payer: Self-pay | Admitting: *Deleted

## 2020-12-02 ENCOUNTER — Other Ambulatory Visit: Payer: Self-pay | Admitting: *Deleted

## 2020-12-02 MED ORDER — ALBUTEROL SULFATE (2.5 MG/3ML) 0.083% IN NEBU: 2.5000 mg | INHALATION_SOLUTION | RESPIRATORY_TRACT | 1 refills | Status: DC | PRN

## 2020-12-02 NOTE — Telephone Encounter (Signed)
Spoke to patient this morning to clarify if he was ever on Periactin and he stated he was on it, and he also needed Proventil nebulizer solution. He already has an appointment scheduled for 02/2021.

## 2020-12-02 NOTE — Addendum Note (Signed)
Addended by: Florence Canner on: 12/02/2020 09:52 AM   Modules accepted: Orders

## 2020-12-02 NOTE — Telephone Encounter (Signed)
Please call patient to clarify. I don't see periactin prescription in the epic system.  Thank you.

## 2020-12-02 NOTE — Addendum Note (Signed)
Addended by: Dub Mikes on: 12/02/2020 08:55 AM   Modules accepted: Orders

## 2020-12-02 NOTE — Telephone Encounter (Signed)
Spoke with patient and he stated that pharmacy informed him that they never received prescription. Script sent to requested pharmacy.

## 2020-12-02 NOTE — Telephone Encounter (Signed)
Patient states that he went to the pharmacy and they did not have his medication.   Please advise.

## 2020-12-02 NOTE — Telephone Encounter (Signed)
Received a fax asking for a refill for Periactin. I did not see this mentioned in your last office note. Is this ok to refill?

## 2020-12-08 ENCOUNTER — Other Ambulatory Visit: Payer: Self-pay | Admitting: Internal Medicine

## 2020-12-19 ENCOUNTER — Ambulatory Visit: Payer: Medicare Other

## 2021-01-06 DIAGNOSIS — J4550 Severe persistent asthma, uncomplicated: Secondary | ICD-10-CM | POA: Diagnosis not present

## 2021-01-07 ENCOUNTER — Other Ambulatory Visit: Payer: Self-pay

## 2021-01-07 ENCOUNTER — Ambulatory Visit (INDEPENDENT_AMBULATORY_CARE_PROVIDER_SITE_OTHER): Payer: Medicare Other

## 2021-01-07 DIAGNOSIS — J4551 Severe persistent asthma with (acute) exacerbation: Secondary | ICD-10-CM

## 2021-01-07 DIAGNOSIS — J4550 Severe persistent asthma, uncomplicated: Secondary | ICD-10-CM | POA: Diagnosis not present

## 2021-01-08 ENCOUNTER — Telehealth: Payer: Self-pay | Admitting: Internal Medicine

## 2021-01-08 NOTE — Telephone Encounter (Signed)
Spoke with patient. He reports his blood pressure has been running low for the last month.He gets dizzy and light headed with blurred vision at times.  Readings are: 1/17 - 111/62 AM, 157/87 PM 1/18 - 97/55 AM 1/20 - 104/64 AM, 134/81 PM 1/21 - 107/60 AM 1/22 - 112/62 AM, 142/78 PM 1/23 - 104/55 AM 1/24 - 122/66 PM 1/25 - 125/73 PM 1/26 - 101/56, 60 AM Heart rate has been running 60-69 per patient.  AM MEDS: Carvedilol, Imdur, Olmesartan PM MEDS: Carvedilol, Olmesartan  Reviewed chart Olmesartan was changed to 1 tablet daiy on 03/28/20, patient did not decrease dose and has been taking 1 tablet twice a day. Advised patient not to take Olmesartan tonight. Advised patient to bring blood pressure log with HR to appointment tomorrow. Advised patient to change positions slowly, increase water intake as he does not drink water currently, advised patient to wear compression stockings. Appointment tomorrow with Azalee Course, PA-C.  Will route to MD.

## 2021-01-08 NOTE — Telephone Encounter (Signed)
Pt c/o BP issue: STAT if pt c/o blurred vision, one-sided weakness or slurred speech  1. What are your last 5 BP readings? Last night 125/73, Jan. 23rf 104/55, day before 112/62, 107/60, 97/55  2. Are you having any other symptoms (ex. Dizziness, headache, blurred vision, passed out)? Dizzy when stands up, blurred vision at times, but not now   3. What is your BP issue? Patient states his BP has been running low. He states when he stands he gets dizzy and has had blurred vision, but does not have it now.

## 2021-01-08 NOTE — Telephone Encounter (Signed)
This can be reviewed tomorrow with Wynema Birch - thanks.  Dr Rexene Edison

## 2021-01-09 ENCOUNTER — Other Ambulatory Visit: Payer: Self-pay

## 2021-01-09 ENCOUNTER — Ambulatory Visit (INDEPENDENT_AMBULATORY_CARE_PROVIDER_SITE_OTHER): Payer: Medicare Other | Admitting: Physician Assistant

## 2021-01-09 VITALS — BP 116/70 | HR 65 | Ht 75.0 in | Wt 218.8 lb

## 2021-01-09 DIAGNOSIS — R42 Dizziness and giddiness: Secondary | ICD-10-CM

## 2021-01-09 DIAGNOSIS — J4550 Severe persistent asthma, uncomplicated: Secondary | ICD-10-CM | POA: Diagnosis not present

## 2021-01-09 DIAGNOSIS — E785 Hyperlipidemia, unspecified: Secondary | ICD-10-CM | POA: Diagnosis not present

## 2021-01-09 DIAGNOSIS — I2510 Atherosclerotic heart disease of native coronary artery without angina pectoris: Secondary | ICD-10-CM

## 2021-01-09 MED ORDER — OLMESARTAN MEDOXOMIL 5 MG PO TABS: 5.0000 mg | ORAL_TABLET | Freq: Every day | ORAL | 3 refills | Status: DC

## 2021-01-09 NOTE — Progress Notes (Signed)
Cardiology Office Note:    Date:  01/11/2021   ID:  Louis Perez, DOB 1951/02/10, MRN [id number]  PCP:  [facility]  [facility] Cardiologist:  Chrystie Nose, MD  [facility] Electrophysiologist:  None   Referring MD: Louis Perez*   Chief Complaint  Patient presents with  . Follow-up    Seen for Dr. [name]    History of Present Illness:    Louis Perez is a 70 y.o. male with a hx of HTN, HLD, Gilbert's disease, severe asthma and CAD.Cardiac catheterization performed on 03/01/2016 showed 95% ostial LAD lesion treated with DES, 25% mid RCA, 30% distal RCA, 30% ostial RPDA lesion, 50% distal LAD lesion. Repeat cardiac catheterization in June 2017 revealed 80% ramus lesion, 20% distal RCA lesion. Due to recurrent symptoms, he underwent another cardiac catheterization in July 2017 that showed 75% ostial ramus lesion was FFR 0.92, 50% mid to distal LAD lesion, 30% ostial D2 lesion, 25% ostial left main, and 25% proximal RCA lesion. Medical therapy was recommended. He was previously on Ranexa and but he weaned himself off of Ranexa in 2018. He remains on Imdur due to chronic intermittent chest pain. Most recent Myoview was performed on 11/20/2019 which showed EF 69%, overall low risk study.   Repeat echocardiogram obtained on 04/11/2020 showed EF 60 to 65%, grade 1 DD, aortic root measuring at 41 mm.  He has been receiving immunotherapy for severe persistent asthma with acute exacerbation.  Patient contacted cardiology service yesterday due to borderline low blood pressures ranged from high 90s to 110s range.  He has been having some dizziness with standing.  Apparently he was taking olmesartan at 5 mg twice a day instead of once a day as previously recommended.  He was instructed to start taking olmesartan at daily basis.  Looking at his blood pressure reading recently, it appears his blood pressure is the lowest in the morning at  breakfast and become normally in the 120s range by afternoon.  I recommend him moving his olmesartan to nighttime along with the nighttime dose of carvedilol.  This way he will be taking Imdur and carvedilol in the morning and olmesartan and carvedilol at night.  He can follow-up with Dr. [name] in 3 months.  He denies any recent significant chest pain or shortness of breath.  I spoke with Louis Boots RN and the patient regarding Orion 4 clinical trial that involve twice yearly subcu injection of Inclisiran, patient is interested in enrolling in clinical trial and gave me permission to let Louis Boots RN to contact him regarding the enrollment.    Past Medical History:  Diagnosis Date  . Arthritis    "hips, lower back, knees, shoulders, hands" (05/14/2016)  . Asthma dx'd in 2014  . Bacterial septicemia (HCC) 01/2011   "both knee involved"  . CAD (coronary artery disease)    a. NSTEMI 02/2016 s/p DES to LAD. b. Relook cath due to angina/prior high risk anatomy 05/14/16 -> continued medical therapy given concern that a stent strut could inferere with Cx itself.  Louis Perez GERD (gastroesophageal reflux disease)   . Gilbert's disease   . Hyperlipidemia   . Hypertension   . 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 NSTEMI (non-ST elevated myocardial infarction) (HCC) 02/2016   Louis Perez 03/02/2016  . Recurrent upper respiratory infection (URI)   . Sinus bradycardia   . Unspecified tinnitus    right    Past Surgical History:  Procedure Laterality  Date  . BACK SURGERY    . CARDIAC CATHETERIZATION N/A 03/01/2016   Procedure: Left Heart Cath and Coronary Angiography;  Surgeon: Louis Bollman, MD;  oLAD 95%, CFX/RI/RCA/RPDA 25-30%  . CARDIAC CATHETERIZATION N/A 03/01/2016   Procedure: Coronary Stent Intervention;  Surgeon: Louis Bollman, MD;  Promus Premier 4.0 x  12 mm DES to oLAD  . CARDIAC CATHETERIZATION N/A 05/14/2016   Procedure: Left Heart Cath and Coronary Angiography;  Surgeon: Louis Bihari, MD;   Location: [facility] INVASIVE CV LAB;  Service: Cardiovascular;  Laterality: N/A;  . CARDIAC CATHETERIZATION N/A 06/17/2016   Procedure: Intravascular Pressure Wire/FFR Study;  Surgeon: Louis Bollman, MD;  Location: [facility] INVASIVE CV LAB;  Service: Cardiovascular;  Laterality: N/A;  . CARDIOVASCULAR STRESS TEST  05/26/2012   EKG negative for ischemia, no ECG changes, no significant ischemia noted  . I & D KNEE WITH POLY EXCHANGE Bilateral 01/2011   & complete synovectomy of 3 compartments of the right total knee/notes 02/04/2011 (replacement,partial knee replacement because of bacterial infection)  . JOINT REPLACEMENT    . KNEE ARTHROSCOPY Right ~ 2002  . LUMBAR DISC SURGERY  ~ 1980; 2000  . NASAL SINUS SURGERY  08/2011  . SHOULDER ARTHROSCOPY W/ ROTATOR CUFF REPAIR Right 01/2015  . TOTAL KNEE ARTHROPLASTY Bilateral 03/26/2004-09/15/2004    right-left  . TRANSTHORACIC ECHOCARDIOGRAM  05/26/2012   EF >55%, mild concentric LVH    Current Medications: Current Meds  Medication Sig  . albuterol (PROVENTIL) (2.5 MG/3ML) 0.083% nebulizer solution Take 3 mLs (2.5 mg total) by nebulization every 4 (four) hours as needed for wheezing or shortness of breath.  Louis Perez albuterol (PROVENTIL) (2.5 MG/3ML) 0.083% nebulizer solution Take 3 mLs (2.5 mg total) by nebulization every 4 (four) hours as needed for wheezing or shortness of breath.  Louis Perez aspirin 81 MG tablet Take 81 mg by mouth at bedtime.   Louis Perez atorvastatin (LIPITOR) 80 MG tablet TAKE 1 TABLET BY MOUTH ONCE DAILY AT  6  PM  . budesonide-formoterol (SYMBICORT) 160-4.5 MCG/ACT inhaler Inhale 2 puffs into the lungs 2 (two) times daily. Rinse, gargle and spit out after use.  . carvedilol (COREG) 3.125 MG tablet Take 1 tablet (3.125 mg total) by mouth 2 (two) times daily.  . cyproheptadine (PERIACTIN) 4 MG tablet TAKE 1 TABLET BY MOUTH TWICE DAILY AS NEEDED FOR ALLERGIES  . isosorbide mononitrate (IMDUR) 60 MG 24 hr tablet Take 1.5 tablets (90 mg total) by mouth daily.  .  melatonin 5 MG TABS Take 5 mg by mouth.  . Multiple Vitamin (MULTIVITAMIN WITH MINERALS) TABS tablet Take 1 tablet by mouth daily.  . naproxen sodium (ALEVE) 220 MG tablet Take 220 mg by mouth daily as needed.  . nitroGLYCERIN (NITROSTAT) 0.4 MG SL tablet PLACE 1 TABLET UNDER TONGUE EVERY 5 MINUTES FOR UP TO 3 DOSES AS NEEDED FOR CHEST PAIN  . pantoprazole (PROTONIX) 40 MG tablet Take 1 tablet (40 mg total) by mouth 2 (two) times daily.  . [DISCONTINUED] olmesartan (BENICAR) 5 MG tablet Take 1 tablet (5 mg total) by mouth daily.  . [DISCONTINUED] omeprazole (PRILOSEC) 40 MG capsule Take 1 capsule by mouth twice daily   Current Facility-Administered Medications for the 01/09/21 encounter (Office Visit) with Azalee Course, PA  Medication  . Mepolizumab SOLR 100 mg     Allergies:   Oxycontin [oxycodone hcl], Percocet [oxycodone-acetaminophen], and Ambien [zolpidem tartrate]   Social History   Socioeconomic History  . Marital status: Married    Spouse name: Not on file  .  Number of children: 2  . Years of education: 51  . Highest education level: Not on file  Occupational History  . Occupation: retired Architect, part-time security work  Tobacco Use  . Smoking status: Former Smoker    Packs/day: 1.00    Years: 5.00    Pack years: 5.00    Types: Cigarettes    Quit date: 12/14/1968    Years since quitting: 52.1  . Smokeless tobacco: Never Used  Vaping Use  . Vaping Use: Never used  Substance and Sexual Activity  . Alcohol use: Yes    Alcohol/week: 1.0 standard drink    Types: 1 Cans of beer per week    Comment: Occasional  . Drug use: No  . Sexual activity: Not Currently    Partners: Female    Comment: Married  Other Topics Concern  . Not on file  Social History Narrative   Patient lives at home with his wife . He is retired. Patient has 14 years of education. Patient has two children.   Caffeine - two cups daily.   Right-handed   Social Determinants of Research scientist (physical sciences) Strain: Not on file  Food Insecurity: Not on file  Transportation Needs: Not on file  Physical Activity: Not on file  Stress: Not on file  Social Connections: Not on file     Family History: The patient's family history includes Alzheimer's disease in his father; Cancer in his paternal aunt, paternal uncle, paternal uncle, and paternal uncle; Dementia in his father; Heart disease in his mother; Heart disease (age of onset: 18) in his brother.  ROS:   Please see the history of present illness.     All other systems reviewed and are negative.  EKGs/Labs/Other Studies Reviewed:    The following studies were reviewed today:  Echo 04/11/2020 1. Left ventricular ejection fraction, by estimation, is 60 to 65%. The  left ventricle has normal function. The left ventricle has no regional  wall motion abnormalities. Left ventricular diastolic parameters are  consistent with Grade I diastolic  dysfunction (impaired relaxation).  2. Right ventricular systolic function is normal. The right ventricular  size is normal.  3. The mitral valve is grossly normal. No evidence of mitral valve  regurgitation.  4. The aortic valve is tricuspid. Aortic valve regurgitation is not  visualized.  5. Aortic dilatation noted. Aneurysm of the ascending aorta, measuring 41  mm. There is mild dilatation at the level of the sinuses of Valsalva  measuring 40 mm.  6. The inferior vena cava is normal in size with greater than 50%  respiratory variability, suggesting right atrial pressure of 3 mmHg.   EKG:  EKG is not ordered today.    Recent Labs: No results found for requested labs within last 8760 hours.  Recent Lipid Panel    Component Value Date/Time   CHOL 173 06/26/2020 0927   CHOL 136 01/08/2017 0833   TRIG 103 06/26/2020 0927   TRIG 109 01/08/2017 0833   HDL 54 06/26/2020 0927   HDL 53 01/08/2017 0833   CHOLHDL 3.2 06/26/2020 0927   CHOLHDL 2.6 01/08/2017 0833   CHOLHDL 3.0  03/01/2016 0448   VLDL 17 03/01/2016 0448   LDLCALC 100 (H) 06/26/2020 0927   LDLCALC 64 01/08/2017 0833     Risk Assessment/Calculations:       Physical Exam:    VS:  BP 116/70 (BP Location: Left Arm, Patient Position: Sitting, Cuff Size: Normal)   Pulse 65  Ht 6\' 3"  (1.905 m)   Wt 218 lb 12.8 oz (99.2 kg)   SpO2 95%   BMI 27.35 kg/m     Wt Readings from Last 3 Encounters:  01/09/21 218 lb 12.8 oz (99.2 kg)  11/25/20 219 lb 9.6 oz (99.6 kg)  11/12/20 217 lb 9.6 oz (98.7 kg)     GEN:  Well nourished, well developed in no acute distress HEENT: Normal NECK: No JVD; No carotid bruits LYMPHATICS: No lymphadenopathy CARDIAC: RRR, no murmurs, rubs, gallops RESPIRATORY:  Clear to auscultation without rales, wheezing or rhonchi  ABDOMEN: Soft, non-tender, non-distended MUSCULOSKELETAL:  No edema; No deformity  SKIN: Warm and dry NEUROLOGIC:  Alert and oriented x 3 PSYCHIATRIC:  Normal affect   ASSESSMENT:    1. Orthostatic dizziness   2. Coronary artery disease involving native coronary artery of native heart without angina pectoris   3. Hyperlipidemia LDL goal <70   4. Asthma, severe persistent, well-controlled    PLAN:    In order of problems listed above:  1. Orthostatic dizziness: We will reduce olmesartan to 5 mg every night.  Patient will be on Imdur and carvedilol in the morning and olmesartan and carvedilol at night.  2. CAD: Denies any chest pain.  On aspirin and statin  3. Hyperlipidemia: On Lipitor.  Last lipid panel showed elevated LDL.  I spoke with the patient, he is interested to be involved in Orion IV trial.   4. Severe persistent asthma: Followed by pulmonology service.        Medication Adjustments/Labs and Tests Ordered: Current medicines are reviewed at length with the patient today.  Concerns regarding medicines are outlined above.  No orders of the defined types were placed in this encounter.  Meds ordered this encounter  Medications   . olmesartan (BENICAR) 5 MG tablet    Sig: Take 1 tablet (5 mg total) by mouth daily after supper.    Dispense:  90 tablet    Refill:  3    Patient Instructions  Medication Instructions:  STOP PRILOSEC  TAKE OLMESARTAN IN THE EVENING  *If you need a refill on your cardiac medications before your next appointment, please call your pharmacy*  Lab Work:   Testing/Procedures:  NONE    NONE  Special Instructions SWOMEONE WILL BE CALLING YOU FROM THE ORION CLINICAL TRIAL  Follow-Up: Your next appointment:  3 month(s) In Person with K. 11/14/20 [name], MD OR IF UNAVAILABLEHAO Vira Chaplin PA-C   At [facility], you and your health needs are our priority.  As part of our continuing mission to provide you with exceptional heart care, we have created designated Provider Care Teams.  These Care Teams include your primary Cardiologist (physician) and Advanced Practice Providers (APPs -  Physician Assistants and Nurse Practitioners) who all work together to provide you with the care you need, when you need it.  We recommend signing up for the patient portal called "MyChart".  Sign up information is provided on this After Visit Summary.  MyChart is used to connect with patients for Virtual Visits (Telemedicine).  Patients are able to view lab/test results, encounter notes, upcoming appointments, etc.  Non-urgent messages can be sent to your provider as well.   To learn more about what you can do with MyChart, go to [facility].       [email or web address], Ramond Dial  01/11/2021 11:14 AM    [facility]

## 2021-01-09 NOTE — Patient Instructions (Signed)
Medication Instructions:  STOP PRILOSEC  TAKE OLMESARTAN IN THE EVENING  *If you need a refill on your cardiac medications before your next appointment, please call your pharmacy*  Lab Work:   Testing/Procedures:  NONE    NONE  Special Instructions SWOMEONE WILL BE CALLING YOU FROM THE ORION CLINICAL TRIAL  Follow-Up: Your next appointment:  3 month(s) In Person with K. [place] [name], MD OR IF UNAVAILABLEHAO MENG PA-C   At [facility], you and your health needs are our priority.  As part of our continuing mission to provide you with exceptional heart care, we have created designated Provider Care Teams.  These Care Teams include your primary Cardiologist (physician) and Advanced Practice Providers (APPs -  Physician Assistants and Nurse Practitioners) who all work together to provide you with the care you need, when you need it.  We recommend signing up for the patient portal called "MyChart".  Sign up information is provided on this After Visit Summary.  MyChart is used to connect with patients for Virtual Visits (Telemedicine).  Patients are able to view lab/test results, encounter notes, upcoming appointments, etc.  Non-urgent messages can be sent to your provider as well.   To learn more about what you can do with MyChart, go to [email or web address].

## 2021-01-11 ENCOUNTER — Encounter: Payer: Self-pay | Admitting: Physician Assistant

## 2021-01-13 ENCOUNTER — Encounter: Payer: Medicare Other | Admitting: *Deleted

## 2021-01-13 ENCOUNTER — Other Ambulatory Visit: Payer: Self-pay

## 2021-01-13 DIAGNOSIS — Z006 Encounter for examination for normal comparison and control in clinical research program: Secondary | ICD-10-CM

## 2021-01-13 NOTE — Research (Signed)
Pt to research  Clinic for screening for Orion 4 study. pt meet inclusion/exclusion. POC cholesterol was 136. Injection given in right ABD. Pt tolerated well. Randomization appointment scheduled for March 23,2022 at 0830.

## 2021-01-13 NOTE — Research (Signed)
Subject Name: Louis Perez  Subject met inclusion and exclusion criteria.  The informed consent form, study requirements and expectations were reviewed with the subject and questions and concerns were addressed prior to the signing of the consent form.  The subject verbalized understanding of the trial requirements.  The subject agreed to participate in the ORION 4 trial and signed the informed consent at 0750 on 01/13/21  The informed consent was obtained prior to performance of any protocol-specific procedures for the subject.  A copy of the signed informed consent was given to the subject and a copy was placed in the subject's medical record.   Star Age Proctor

## 2021-02-04 ENCOUNTER — Other Ambulatory Visit: Payer: Self-pay

## 2021-02-04 ENCOUNTER — Ambulatory Visit (INDEPENDENT_AMBULATORY_CARE_PROVIDER_SITE_OTHER): Payer: Medicare Other

## 2021-02-04 DIAGNOSIS — J4550 Severe persistent asthma, uncomplicated: Secondary | ICD-10-CM

## 2021-02-11 ENCOUNTER — Other Ambulatory Visit: Payer: Self-pay

## 2021-02-11 ENCOUNTER — Encounter: Payer: Self-pay | Admitting: Allergy & Immunology

## 2021-02-11 ENCOUNTER — Ambulatory Visit (INDEPENDENT_AMBULATORY_CARE_PROVIDER_SITE_OTHER): Payer: Medicare Other | Admitting: Allergy & Immunology

## 2021-02-11 VITALS — BP 138/90 | HR 71 | Temp 97.6°F | Resp 16 | Ht 75.0 in | Wt 222.8 lb

## 2021-02-11 DIAGNOSIS — K219 Gastro-esophageal reflux disease without esophagitis: Secondary | ICD-10-CM | POA: Diagnosis not present

## 2021-02-11 DIAGNOSIS — J4550 Severe persistent asthma, uncomplicated: Secondary | ICD-10-CM

## 2021-02-11 DIAGNOSIS — I2510 Atherosclerotic heart disease of native coronary artery without angina pectoris: Secondary | ICD-10-CM | POA: Diagnosis not present

## 2021-02-11 DIAGNOSIS — J3089 Other allergic rhinitis: Secondary | ICD-10-CM

## 2021-02-11 MED ORDER — IPRATROPIUM BROMIDE 0.06 % NA SOLN: 1.0000 | Freq: Four times a day (QID) | NASAL | 5 refills | Status: DC | PRN

## 2021-02-11 NOTE — Progress Notes (Signed)
FOLLOW UP  Date of Service/Encounter:  02/11/21   Assessment:   Severe persistent asthma, uncomplicated (with two steroid bursts in the last six months) - ? antibodies to Nucala   Allergic rhinitis- needs to use the nasal atrovent more often  LPRD (laryngopharyngeal reflux disease)- with continued hoarseness despite changing reflux mediations at the last visit  Fully vaccinated to COVID19 with booster  Plan/Recommendations:   1. Severe persistent asthma, uncomplicated - Lung testing not done today. - Add on Alvesco two puffs twice daily (in addition to your Symbicort) to see if this helps avoid systemic steroids (prednisone). - BUT do call us if you are not feeling better and we can start prednisone at that time. - We are not going to make changes today, but if your asthma seems to be under worse control in the future, we might think about changing injectable asthma medications. - Daily controller medication(s): Symbicort 160mg  two puffs twice daily with spacer + Nucala 100 mg monthly + Alevsco two puffs TWICE DAILY FOR TWO WEEKS - Prior to physical activity: albuterol 2 puffs 10-15 minutes before physical activity. - Rescue medications: albuterol 4 puffs every 4-6 hours as needed - Asthma control goals:  * Full participation in all desired activities (may need albuterol before activity) * Albuterol use two time or less a week on average (not counting use with activity) * Cough interfering with sleep two time or less a month * Oral steroids no more than once a year * No hospitalizations  2. Allergic rhinitis - Continue with nasal ipratropium one spray per nostril up to four tines daily as needed for runny noses.   3. LPRD (laryngopharyngeal reflux disease) - Continue with pantoprazole 40mg  twice daily. - Continue with Pepcid twice daily.  - We can send you to GI if needed to evaluate this hoarseness.   4. Return in about 3 months (around 05/14/2021).    Subjective:   Louis Perez is a 70 y.o. male presenting today for follow up of  Chief Complaint  Patient presents with  . Asthma    ACT - 19  Has nasal drip with clear mucous and coughs up yellow/ green mucous      Louis Perez has a history of the following: Patient Active Problem List   Diagnosis Date Noted  . Severe persistent asthma with acute exacerbation 11/25/2020  . Shortness of breath 11/25/2020  . Viral upper respiratory infection 11/25/2020  . Asthma, severe persistent, well-controlled 11/09/2018  . Other allergic rhinitis 11/09/2018  . LPRD (laryngopharyngeal reflux disease) 11/09/2018  . Stomatitis and mucositis 11/09/2018  . Pansinusitis 01/06/2017  . Hyperlipidemia LDL goal <70 07/23/2016  . Anginal chest pain at rest [facility]) 06/17/2016  . CAD in native artery 06/15/2016  . Stented coronary artery 06/15/2016  . Pain in the chest 05/28/2016  . Sinus bradycardia 05/15/2016  . Unstable angina (HCC) 05/14/2016  . CAD (coronary artery disease), native coronary artery 05/14/2016  . Dyslipidemia 09/28/2013  . GERD (gastroesophageal reflux disease) 09/28/2013  . 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 Moderate persistent chronic asthma with acute exacerbation 06/05/2013  . Essential hypertension 06/05/2013    History obtained from: chart review and patient.  Louis Perez is a 70 y.o. male presenting for a follow up visit.  He was last seen by me in November 2021.  At that time, his lung testing looked great.  We made no medication changes to his asthma meds.  We continued Symbicort 160 mcg 2 puffs twice daily  as well as mepolizumab monthly.  For his rhinitis, we continue with Atrovent as needed.  For his reflux we stopped his omeprazole and started pantoprazole 40 mg twice daily.  We also continue with Pepcid twice daily.  In the interim, he was seen by Dr. Selena Batten in December 2021 and received a course of prednisone for an asthma exacerbation.  Since last visit,  He notes  that he has a lower pulse ox on the right side with a pulse ox in the low 90% range. HE has a higher one on the right side with levels going up to 96%.   Asthma/Respiratory Symptom History: He is on Nucala nad the Symbicort two [puffs BID. He started brething treatments last night. He denies fevers. He is UTD on his COVID vaccines including a booster. He did get steroids back in December 2021. He had a negative COVID test at the time and it was negative. He denies COVID19 exposures over the last week.   Allergic Rhinitis Symptom History: He reports that he has been fighting a sore throat over the last week tor so. He thinks that he is getting better. Laying down seems to make it worse. He does not remember getting antibiotics for any sinus infections in the last several months.   He has been seeing neurologist for ear sizzling noise. This affects his vision as well during these episodes.    GERD Symptom History: He has the same hoarseness. He does not feel that the change to Protonix did not change his symptoms at all. He reports "running out of breath" with talking. This is throughout the day without correlation with mealtimes. He has never seen GI or ENT for evaluation of this hoarseness.   Otherwise, there have been no changes to his past medical history, surgical history, family history, or social history.    Review of Systems  Constitutional: Negative.  Negative for chills, fever, malaise/fatigue and weight loss.  HENT: Negative.  Negative for congestion, ear discharge and ear pain.   Eyes: Negative for pain, discharge and redness.  Respiratory: Positive for shortness of breath and wheezing. Negative for cough and sputum production.   Cardiovascular: Negative.  Negative for chest pain and palpitations.  Gastrointestinal: Negative for abdominal pain, constipation, diarrhea, heartburn, nausea and vomiting.  Skin: Negative.  Negative for itching and rash.  Neurological: Negative for dizziness  and headaches.  Endo/Heme/Allergies: Negative for environmental allergies. Does not bruise/bleed easily.       Objective:   Blood pressure 138/90, pulse 71, temperature 97.6 F (36.4 C), resp. rate 16, height 6\' 3"  (1.905 m), weight 222 lb 12.8 oz (101.1 kg), SpO2 96 %. Body mass index is 27.85 kg/m.   Physical Exam:  Physical Exam Constitutional:      Appearance: He is well-developed.     Comments: Stoic.  HENT:     Head: Normocephalic and atraumatic.     Right Ear: Tympanic membrane, ear canal and external ear normal.     Left Ear: Tympanic membrane, ear canal and external ear normal.     Nose: No nasal deformity, septal deviation, mucosal edema, rhinorrhea or epistaxis.     Right Turbinates: Enlarged and pale.     Left Turbinates: Enlarged and pale.     Right Sinus: No maxillary sinus tenderness or frontal sinus tenderness.     Left Sinus: No maxillary sinus tenderness or frontal sinus tenderness.     Comments: No polyps.    Mouth/Throat:     Mouth: Oropharynx  is clear and moist. Mucous membranes are not pale and not dry.     Pharynx: Uvula midline.  Eyes:     General:        Right eye: No discharge.        Left eye: No discharge.     Extraocular Movements: EOM normal.     Conjunctiva/sclera: Conjunctivae normal.     Right eye: Right conjunctiva is not injected. No chemosis.    Left eye: Left conjunctiva is not injected. No chemosis.    Pupils: Pupils are equal, round, and reactive to light.  Cardiovascular:     Rate and Rhythm: Normal rate and regular rhythm.     Heart sounds: Normal heart sounds.  Pulmonary:     Effort: Pulmonary effort is normal. No tachypnea, accessory muscle usage or respiratory distress.     Breath sounds: Normal breath sounds. No wheezing, rhonchi or rales.     Comments: Moving air well in all lung fields.  No wheezing or crackles.  No increased work of breathing. Chest:     Chest wall: No tenderness.  Lymphadenopathy:     Cervical: No  cervical adenopathy.  Skin:    General: Skin is warm.     Capillary Refill: Capillary refill takes less than 2 seconds.     Coloration: Skin is not pale.     Findings: No abrasion, erythema, petechiae or rash. Rash is not papular, urticarial or vesicular.     Comments: No lesions noted.  Neurological:     Mental Status: He is alert.  Psychiatric:        Mood and Affect: Mood and affect normal.      Diagnostic studies: none       Malachi Bonds, MD  [facility]

## 2021-02-11 NOTE — Patient Instructions (Addendum)
1. Severe persistent asthma, uncomplicated - Lung testing not done today. - Add on Alvesco two puffs twice daily (in addition to your Symbicort) to see if this helps avoid systemic steroids (prednisone). - BUT do call us if you are not feeling better and we can start prednisone at that time. - We are not going to make changes today, but if your asthma seems to be under worse control in the future, we might think about changing injectable asthma medications. - Daily controller medication(s): Symbicort 160mg  two puffs twice daily with spacer + Nucala 100 mg monthly + Alevsco two puffs TWICE DAILY FOR TWO WEEKS - Prior to physical activity: albuterol 2 puffs 10-15 minutes before physical activity. - Rescue medications: albuterol 4 puffs every 4-6 hours as needed - Asthma control goals:  * Full participation in all desired activities (may need albuterol before activity) * Albuterol use two time or less a week on average (not counting use with activity) * Cough interfering with sleep two time or less a month * Oral steroids no more than once a year * No hospitalizations  2. Allergic rhinitis - Continue with nasal ipratropium one spray per nostril up to four tines daily as needed for runny noses.   3. LPRD (laryngopharyngeal reflux disease) - Continue with pantoprazole 40mg  twice daily. - Continue with Pepcid twice daily.  - We can send you to GI if needed to evaluate this hoarseness.   4. Return in about 3 months (around 05/14/2021).    Please inform of any Emergency Department visits, hospitalizations, or changes in symptoms. Call 07/14/2021 before going to the ED for breathing or allergy symptoms since we might be able to fit you in for a sick visit. Feel free to contact us anytime with any questions, problems, or concerns.  It was a pleasure to see you again today!  Websites that have reliable patient information: 1. American Academy of Asthma, Allergy, and Immunology: www.aaaai.org 2.  Food Allergy Research and Education (FARE): foodallergy.org 3. Mothers of Asthmatics: http://www.asthmacommunitynetwork.org 4. American College of Allergy, Asthma, and Immunology: www.acaai.org   COVID-19 Vaccine Information can be found at: [place] For questions related to vaccine distribution or appointments, please email [email or web address] or call [phone].   We realize that you might be concerned about having an allergic reaction to the COVID19 vaccines. To help with that concern, WE ARE OFFERING THE COVID19 VACCINES IN OUR OFFICE! Ask the front desk for dates!     "Like" [email or web address] on Facebook and Instagram for our latest updates!      A healthy democracy works best when [phone] participate! Make sure you are registered to vote! If you have moved or changed any of your contact information, you will need to get this updated before voting!  In some cases, you MAY be able to register to vote online: [place]

## 2021-02-14 ENCOUNTER — Telehealth: Payer: Self-pay | Admitting: *Deleted

## 2021-02-14 ENCOUNTER — Encounter: Payer: Self-pay | Admitting: Internal Medicine

## 2021-02-14 NOTE — Telephone Encounter (Signed)
L/m for patient to contact me to explain that we are unable to continue buy and bill for Nucala due to [facility] underpayment. I will discussed change over to [facility] with patient if he is ok with that we will proceed with his next dose the change to [facility]

## 2021-03-03 DIAGNOSIS — J4550 Severe persistent asthma, uncomplicated: Secondary | ICD-10-CM | POA: Diagnosis not present

## 2021-03-03 NOTE — Telephone Encounter (Signed)
Advised patient of change from [place] to [place]

## 2021-03-04 ENCOUNTER — Ambulatory Visit (INDEPENDENT_AMBULATORY_CARE_PROVIDER_SITE_OTHER): Payer: Medicare Other

## 2021-03-04 ENCOUNTER — Other Ambulatory Visit: Payer: Self-pay

## 2021-03-04 DIAGNOSIS — J4550 Severe persistent asthma, uncomplicated: Secondary | ICD-10-CM | POA: Diagnosis not present

## 2021-03-04 MED ORDER — BENRALIZUMAB 30 MG/ML ~~LOC~~ SOSY
30.0000 mg | PREFILLED_SYRINGE | SUBCUTANEOUS | Status: AC
  Administered 2021-03-04 – 2021-04-29 (×3): 30 mg via SUBCUTANEOUS

## 2021-03-04 NOTE — Progress Notes (Signed)
Immunotherapy   Patient Details  Name: Louis Perez MRN: [id number] Date of Birth: 1951/05/16  03/04/2021  Louis Perez here to start [facility] for asthma.  Frequency: every 4 weeks times 3 doses then every 8 weeks Epi-Pen: Yes Consent signed and patient instructions given.   Dub Mikes 03/04/2021, 8:40 AM

## 2021-03-06 ENCOUNTER — Encounter: Payer: Medicare Other | Admitting: *Deleted

## 2021-03-06 ENCOUNTER — Other Ambulatory Visit: Payer: Self-pay

## 2021-03-06 DIAGNOSIS — Z006 Encounter for examination for normal comparison and control in clinical research program: Secondary | ICD-10-CM

## 2021-03-06 NOTE — Research (Signed)
Subject came into research clinic today to be randomized into the [facility].   Mr. Devaul did sign the addendum to the informed consent explaining how the drug has been approved now before any assessment was completed today.   Subject Name: Louis Perez  Subject met inclusion and exclusion criteria.  The informed consent form, study requirements and expectations were reviewed with the subject and questions and concerns were addressed prior to the signing of the consent form.  The subject verbalized understanding of the trial requirements.  The subject agreed to participate in the ORION 4 trial and signed the informed consent at 0837 on 03/06/21  The informed consent was obtained prior to performance of any protocol-specific procedures for the subject.  A copy of the signed informed consent was given to the subject and a copy was placed in the subject's medical record.   Louis Perez   This subject was successfully randomized into the [employer]. All concomitant medications were reviewed and updated. There are no new AE's or SAE's to report to sponsor at this time. Subject received their IP injection subcutaneously into their right abdomen with no issues other than stating that it "stung a little bit." Their next follow-up appointment was scheduled for Wednesday, June 22nd, 2022 @ 0830 am.

## 2021-03-23 ENCOUNTER — Other Ambulatory Visit: Payer: Self-pay | Admitting: Physician Assistant

## 2021-03-31 DIAGNOSIS — J4550 Severe persistent asthma, uncomplicated: Secondary | ICD-10-CM | POA: Diagnosis not present

## 2021-04-01 ENCOUNTER — Other Ambulatory Visit: Payer: Self-pay

## 2021-04-01 ENCOUNTER — Ambulatory Visit (INDEPENDENT_AMBULATORY_CARE_PROVIDER_SITE_OTHER): Payer: Medicare Other

## 2021-04-01 DIAGNOSIS — J4550 Severe persistent asthma, uncomplicated: Secondary | ICD-10-CM

## 2021-04-01 MED ORDER — BUDESONIDE-FORMOTEROL FUMARATE 160-4.5 MCG/ACT IN AERO
2.0000 | INHALATION_SPRAY | Freq: Two times a day (BID) | RESPIRATORY_TRACT | 5 refills | Status: DC
Start: 2021-04-01 — End: 2021-05-15

## 2021-04-13 ENCOUNTER — Other Ambulatory Visit: Payer: Self-pay | Admitting: Physician Assistant

## 2021-04-15 ENCOUNTER — Ambulatory Visit: Payer: Medicare Other | Admitting: Internal Medicine

## 2021-04-21 ENCOUNTER — Other Ambulatory Visit: Payer: Self-pay

## 2021-04-21 ENCOUNTER — Ambulatory Visit (INDEPENDENT_AMBULATORY_CARE_PROVIDER_SITE_OTHER): Payer: Medicare Other | Admitting: Internal Medicine

## 2021-04-21 ENCOUNTER — Encounter: Payer: Self-pay | Admitting: Internal Medicine

## 2021-04-21 VITALS — BP 126/90 | HR 64 | Ht 75.0 in | Wt 220.2 lb

## 2021-04-21 DIAGNOSIS — I2510 Atherosclerotic heart disease of native coronary artery without angina pectoris: Secondary | ICD-10-CM

## 2021-04-21 DIAGNOSIS — E785 Hyperlipidemia, unspecified: Secondary | ICD-10-CM

## 2021-04-21 DIAGNOSIS — R0989 Other specified symptoms and signs involving the circulatory and respiratory systems: Secondary | ICD-10-CM

## 2021-04-21 DIAGNOSIS — Z006 Encounter for examination for normal comparison and control in clinical research program: Secondary | ICD-10-CM

## 2021-04-21 MED ORDER — NITROGLYCERIN 0.4 MG SL SUBL: 0.4000 mg | SUBLINGUAL_TABLET | SUBLINGUAL | 3 refills | Status: AC | PRN

## 2021-04-21 NOTE — Patient Instructions (Signed)

## 2021-04-21 NOTE — Progress Notes (Signed)
Cardiology Office Note   Date:  04/21/2021   ID:  Louis Perez, DOB 1951/09/20, MRN [id number]  [place]:  [place], [facility]  Cardiologist:   Chrystie NoseKenneth C Alma Muegge, MD   CC: Follow-up stress test  History of Present Illness: Louis Perez Louis is a 70 y.o. male who presents for evaluation of chest and back pain. Patient has a history of coronary disease with recent NSTEMI in March with DES stenting of the LAD.  He had non obstructive disease elsewhere.  EF was normal.    He was added to my schedule today for evaluation of chest and arm discomfort. This began on Sunday. It is similar to his previous unstable angina but not as severe. Still he describes the peak of his discomfort at 7 out of 10 in intensity. He gets across his chest. His left arm hurt. At times he has noticed some diaphoresis. It might last for only a minute. He has taken nitroglycerin and over the course of the last few days and it improves the symptoms. He describes it as somewhat heavy. He's not had any palpitations, presyncope or syncope. He's not describing any new shortness of breath, PND or orthopnea. He has brought the discomfort on a few times when he's been doing activities although this is been intermittent. He was actually able to do a little golfing yesterday without symptoms. However, prior to this he had discomfort with golfing building his children sandbox. This morning the pain was at rest. He still describes a little mid chest tightness.  Of note he has not had the symptoms since his stent.  I did review his catheterization. I looked at the images and he had a very high grade proximal LAD in March with nonobstructive disease in the right coronary.  05/28/2016  Louis Perez is a 70 year old patient who I last saw in 2014. He did not see me in follow-up since that time and is seen a number of other providers in our practice, ultimately leading to presentation with an STEMI in March with a  placement of a drug-eluting stent in the proximal LAD. Subsequent to that he had chest pain as described above and was referred back to the hospital for coronary evaluation. Catheterization indicated an 80% ostial ramus lesion at the area of partially jailed vessel due to the proximal LAD stent. It was felt that intervention could compromise the LAD therefore medical therapy was recommended. His isosorbide was doubled to 60 mg daily. Since then he reports he has had some improvement in chest discomfort but has significant headache.  06/15/2016  Louis Perez was seen back today in the office for chest pain. When I last saw him we had increased his isosorbide up to 60 mg daily and he's been on Ranexa thousand milligrams twice a day. He reports he continues to have episodes of chest discomfort which can occur at rest or with exertion. I've advised that he stop cardiac rehabilitation until we can determine whether or not his pain is related to coronary obstruction.  07/23/2016  Louis Perez returns for follow-up today. He underwent repeat cardiac catheterization by Dr. Excell Seltzerooper. This showed the following:  Conclusion    Prox RCA to Mid RCA lesion, 25% stenosed.  Ost LM lesion, 25% stenosed.  Ost Ramus lesion, 75% stenosed.  Mid LAD to Dist LAD lesion, 50% stenosed.  Ost 2nd Diag to 2nd Diag lesion, 30% stenosed.  1. Continued patency of the proximal LAD stent 2. Mild  diffuse nonobstructive RCA stenosis with diffuse coronary ectasia 3. Patent left circumflex 4. Moderate-severe stenosis of the ramus intermedius with negative pressure wire analysis (FFR = 0.92 baseline and 0.85 at peak hyperemia)  Recommend: resume cardiac rehab, medical therapy   No intervention was performed. Since discharge she notes that he's had no further chest pain. He feels like the change in his isosorbide is been helpful. He is about to finish card agreeable dictation and will continue to exercise at the [facility]. He's lost  about 5-7 pounds and is well within the normal weight range at this time.  01/04/2017  Louis Perez returns today for follow-up. He reports that initially had some improvement with increasing his medical therapy for angina but still continues to have sharp, intermittent chest pains on a daily basis. This is typically in the right sternal area. He would not clearly say that his symptoms are progressively getting worse. Blood pressure is well-controlled today. His LDL cholesterol is 93 on 80 mg Lipitor. I advised him that his goal cholesterol should be much lower and that we may have difficulty reaching that since he is on a high potency statin. Options include adding ezetimibe or perhaps enrolling him in the Orion-10 trial. He did seem to be interested in the study medication. His last lipid profile was earlier this year and he will be due for repeat blood work. He is also had problems with asthma and has seen Dr. Sherene Sires for evaluation of this and to include cyclical shortness of breath.  03/22/2017  Louis Perez returns today for follow-up. He is doing fairly well denies any recurrent chest pain. Unfortunately has an upper respiratory infection and is had a flare of his asthma recently. Cholesterol is much better control with LDL now down to 64 on recent lab work with an LDL particle number less than 1000. ApoB and LPA level both at goal as well. It's now been over one year since his drug-eluting stent. He underwent a relook catheterization which showed a patent stent a couple months after his initial procedure for ongoing chest pain. He is also on Ranexa and isosorbide. He is interested in coming off some of his medicines.  08/02/2017  Louis Perez returns today for follow-up. He reports he occasionally gets some chest discomfort but its quick, sharp and atypical for coronary disease. He's found nothing to be limiting of his activities. He weaned himself off Ranexa by his request. He remains on isosorbide 60 mg  twice a day. He has not required short acting nitroglycerin. Overall he feels fairly well. His energy level is good. He is on atorvastatin 80 mg with an LDL C a less than 70. Blood pressure is at goal.  02/15/2018  Mr. Pulliam returns today for follow-up.  He continues to get occasional sharp chest discomfort.  This is a similar complaint he had in August of last year.  He took himself off Ranexa and noticed no difference in his symptoms.  He is on isosorbide.  He said none of the episodes last long enough to take short acting nitro.  His wife is concerned only that he seems to mention it more frequently.  The episodes are not associated with exertion or relieved by rest.  EKG personally reviewed today shows normal sinus rhythm without ischemic changes at 64.  09/26/2018  Mr. Kemmerling is seen today in follow-up.  Over the past year he is done very well.  He is now walking up to about 20 miles a week.  He denies any sharp chest pains or worsening shortness of breath.  He is off of his antianginal medications.  He had lipid testing this summer and LDL was 81.  He reports a pretty healthy diet however does have some intake of saturated fats that could be improved.  11/14/2019  Mr. Chipps is seen today in follow-up.  This is a routine visit however he has been having some symptoms.  Recently has had some worsening dyspnea on exertion.  He did see his pulmonologist and had medications adjusted for his asthma and was treated for possible upper respiratory infection.  He is also been struggling with sores in his mouth and was placed on a steroid rinse for that.  He is noted labile blood pressures recently.  His symptoms are worse with regards to dyspnea walking up hills but he denies any chest pain.  His last coronary evaluation was in 2017 at which time he had FFR of the ramus intermedius which was 0.85 noting a 75% ostial lesion.  This was not intervened upon.  He also had 50% mid to distal LAD stenosis and some  mild disease of the other coronaries.  He has been on medical therapy since then.  I am concerned that he is possibly had some progressive coronary disease.  11/28/2019  Mr. Curfman returns today for follow-up.  He underwent stress testing which was negative for ischemia and demonstrated normal LV function.  After adjusting his medications his blood pressure is now better controlled.  He says the shortness of breath has improved as well.  06/25/2020  Mr. Stahl is seen today in follow-up.  Recently he was seen by Azalee Course, PA-C in April.  At that time he was having some intermediate chest discomfort.  His Imdur was increased.  Since then he said no more symptoms.  Overall he says he feels really well.  He gets some occasional shortness of breath but he attributes this to his asthma which is quite significant requiring a number of inhalers and immunotherapy.  Blood pressures well controlled today.  He is overdue for lipid profile which was last assessed in November 2019.  LDL at the time was 73.  EKG personally reviewed today shows sinus rhythm.  04/21/2021  Mr. Moes is seen today in follow-up.  He recently enrolled in the [place] for trial.  This would randomize him to inclisiran or placebo on top of his atorvastatin.  He denies any chest pain or worsening shortness of breath.  He is now 5 years out from PCI.  EKG shows a sinus rhythm at 64.  Past Medical History:  Diagnosis Date  . Arthritis    "hips, lower back, knees, shoulders, hands" (05/14/2016)  . Asthma dx'd in 2014  . Bacterial septicemia (HCC) 01/2011   "both knee involved"  . CAD (coronary artery disease)    a. NSTEMI 02/2016 s/p DES to LAD. b. Relook cath due to angina/prior high risk anatomy 05/14/16 -> continued medical therapy given concern that a stent strut could inferere with Cx itself.  Marland Kitchen GERD (gastroesophageal reflux disease)   . Gilbert's disease   . Hyperlipidemia   . Hypertension   . 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 NSTEMI (non-ST elevated myocardial infarction) (HCC) 02/2016   [name] 03/02/2016  . Recurrent upper respiratory infection (URI)   . Sinus bradycardia   . Unspecified tinnitus    right    Past Surgical History:  Procedure Laterality Date  . BACK SURGERY    .  CARDIAC CATHETERIZATION N/A 03/01/2016   Procedure: Left Heart Cath and Coronary Angiography;  Surgeon: Tonny Bollman, MD;  oLAD 95%, CFX/RI/RCA/RPDA 25-30%  . CARDIAC CATHETERIZATION N/A 03/01/2016   Procedure: Coronary Stent Intervention;  Surgeon: Tonny Bollman, MD;  Promus Premier 4.0 x  12 mm DES to oLAD  . CARDIAC CATHETERIZATION N/A 05/14/2016   Procedure: Left Heart Cath and Coronary Angiography;  Surgeon: Lennette Bihari, MD;  Location: [facility] INVASIVE CV LAB;  Service: Cardiovascular;  Laterality: N/A;  . CARDIAC CATHETERIZATION N/A 06/17/2016   Procedure: Intravascular Pressure Wire/FFR Study;  Surgeon: Tonny Bollman, MD;  Location: [facility] INVASIVE CV LAB;  Service: Cardiovascular;  Laterality: N/A;  . CARDIOVASCULAR STRESS TEST  05/26/2012   EKG negative for ischemia, no ECG changes, no significant ischemia noted  . I & D KNEE WITH POLY EXCHANGE Bilateral 01/2011   & complete synovectomy of 3 compartments of the right total knee/notes 02/04/2011 (replacement,partial knee replacement because of bacterial infection)  . JOINT REPLACEMENT    . KNEE ARTHROSCOPY Right ~ 2002  . LUMBAR DISC SURGERY  ~ 1980; 2000  . NASAL SINUS SURGERY  08/2011  . SHOULDER ARTHROSCOPY W/ ROTATOR CUFF REPAIR Right 01/2015  . TOTAL KNEE ARTHROPLASTY Bilateral 03/26/2004-09/15/2004    right-left  . TRANSTHORACIC ECHOCARDIOGRAM  05/26/2012   EF >55%, mild concentric LVH     Current Outpatient Medications  Medication Sig Dispense Refill  . albuterol (PROVENTIL) (2.5 MG/3ML) 0.083% nebulizer solution Take 3 mLs (2.5 mg total) by nebulization every 4 (four) hours as needed for wheezing or shortness of breath. 75 mL 1  . albuterol (PROVENTIL) (2.5 MG/3ML) 0.083%  nebulizer solution Take 3 mLs (2.5 mg total) by nebulization every 4 (four) hours as needed for wheezing or shortness of breath. 75 mL 1  . aspirin 81 MG tablet Take 81 mg by mouth at bedtime.     Marland Kitchen atorvastatin (LIPITOR) 80 MG tablet TAKE 1 TABLET BY MOUTH ONCE DAILY AT  6  PM 90 tablet 0  . budesonide-formoterol (SYMBICORT) 160-4.5 MCG/ACT inhaler Inhale 2 puffs into the lungs 2 (two) times daily. Rinse, gargle and spit out after use. 10.2 g 5  . carvedilol (COREG) 3.125 MG tablet Take 1 tablet by mouth twice daily 180 tablet 3  . famotidine (PEPCID) 20 MG tablet Take 40 mg by mouth daily.    Marland Kitchen ipratropium (ATROVENT) 0.06 % nasal spray Place 1 spray into the nose 4 (four) times daily as needed for rhinitis. 15 mL 5  . isosorbide mononitrate (IMDUR) 60 MG 24 hr tablet TAKE 1 & 1/2 (ONE & ONE-HALF) TABLETS BY MOUTH ONCE DAILY 135 tablet 1  . melatonin 5 MG TABS Take 5 mg by mouth.    . Multiple Vitamin (MULTIVITAMIN WITH MINERALS) TABS tablet Take 1 tablet by mouth daily.    . naproxen sodium (ALEVE) 220 MG tablet Take 220 mg by mouth daily as needed.    . nitroGLYCERIN (NITROSTAT) 0.4 MG SL tablet PLACE 1 TABLET UNDER TONGUE EVERY 5 MINUTES FOR UP TO 3 DOSES AS NEEDED FOR CHEST PAIN 25 tablet 3  . pantoprazole (PROTONIX) 40 MG tablet Take 1 tablet (40 mg total) by mouth 2 (two) times daily. 60 tablet 5   Current Facility-Administered Medications  Medication Dose Route Frequency Provider Last Rate Last Admin  . Benralizumab SOSY 30 mg  30 mg Subcutaneous Q28 days Alfonse Spruce, MD   30 mg at 04/01/21 0623    Allergies:   Oxycontin [oxycodone hcl], Percocet [  oxycodone-acetaminophen], Ambien [zolpidem tartrate], and Zolpidem    Social History:  The patient  reports that he quit smoking about 52 years ago. His smoking use included cigarettes. He has a 5.00 pack-year smoking history. He has never used smokeless tobacco. He reports current alcohol use of about 1.0 standard drink of alcohol  per week. He reports that he does not use drugs.   Family History:  The patient's family history includes Alzheimer's disease in his father; Cancer in his paternal aunt, paternal uncle, paternal uncle, and paternal uncle; Dementia in his father; Heart disease in his mother; Heart disease (age of onset: 20) in his brother.    ROS:   Pertinent items noted in HPI and remainder of comprehensive ROS otherwise negative.  PHYSICAL EXAM: VS:  BP 126/90   Pulse 64   Ht 6\' 3"  (1.905 m)   Wt 220 lb 3.2 oz (99.9 kg)   BMI 27.52 kg/m  , BMI Body mass index is 27.52 kg/m. General appearance: alert, no distress and Tanned skin Neck: no carotid bruit, no JVD and thyroid not enlarged, symmetric, no tenderness/mass/nodules Lungs: clear to auscultation bilaterally Heart: regular rate and rhythm, S1, S2 normal, no murmur, click, rub or gallop Abdomen: soft, non-tender; bowel sounds normal; no masses,  no organomegaly Extremities: extremities normal, atraumatic, no cyanosis or edema Pulses: 2+ and symmetric Skin: Skin color, texture, turgor normal. No rashes or lesions Neurologic: Grossly normal Psych: Pleasant  EKG:   Normal sinus rhythm 64-personally reviewed  Recent Labs: No results found for requested labs within last 8760 hours.   Lipid Panel    Component Value Date/Time   CHOL 173 06/26/2020 0927   CHOL 136 01/08/2017 0833   TRIG 103 06/26/2020 0927   TRIG 109 01/08/2017 0833   HDL 54 06/26/2020 0927   HDL 53 01/08/2017 0833   CHOLHDL 3.2 06/26/2020 0927   CHOLHDL 2.6 01/08/2017 0833   CHOLHDL 3.0 03/01/2016 0448   VLDL 17 03/01/2016 0448   LDLCALC 100 (H) 06/26/2020 0927   LDLCALC 64 01/08/2017 0833     Wt Readings from Last 3 Encounters:  04/21/21 220 lb 3.2 oz (99.9 kg)  02/11/21 222 lb 12.8 oz (101.1 kg)  01/09/21 218 lb 12.8 oz (99.2 kg)    ASSESSMENT AND PLAN:  Patient Active Problem List   Diagnosis Date Noted  . Severe persistent asthma with acute exacerbation  11/25/2020  . Shortness of breath 11/25/2020  . Viral upper respiratory infection 11/25/2020  . Asthma, severe persistent, well-controlled 11/09/2018  . Other allergic rhinitis 11/09/2018  . LPRD (laryngopharyngeal reflux disease) 11/09/2018  . Stomatitis and mucositis 11/09/2018  . Pansinusitis 01/06/2017  . Hyperlipidemia LDL goal <70 07/23/2016  . Anginal chest pain at rest [facility]) 06/17/2016  . CAD in native artery 06/15/2016  . Stented coronary artery 06/15/2016  . Pain in the chest 05/28/2016  . Sinus bradycardia 05/15/2016  . Unstable angina (HCC) 05/14/2016  . CAD (coronary artery disease), native coronary artery 05/14/2016  . Dyslipidemia 09/28/2013  . GERD (gastroesophageal reflux disease) 09/28/2013  . 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 Moderate persistent chronic asthma with acute exacerbation 06/05/2013  . Essential hypertension 06/05/2013   PLAN: Mr. Gildner denies any chest pain or worsening shortness of breath.  He recently enrolled in the [place] for trial.  Hopefully he has been randomized to drug.  He has reported some labile blood pressures at home but seems to be better controlled today.  Diastolic is a little high.  He is currently  taking the olmesartan 5 mg twice daily.  Plan follow-up annually or sooner as necessary.  Chrystie Nose, MD, [facility], FACP    [facility]  Medical Director of the [facility] Lipid Disorders &  Cardiovascular Risk Reduction Clinic Diplomate of the American Board of Clinical Lipidology Attending Cardiologist  Direct Dial: [phone]  Fax: [phone]  Website:  [email or web address]  Chrystie Nose, MD  04/21/2021 9:34 AM

## 2021-04-28 DIAGNOSIS — J4550 Severe persistent asthma, uncomplicated: Secondary | ICD-10-CM | POA: Diagnosis not present

## 2021-04-29 ENCOUNTER — Other Ambulatory Visit: Payer: Self-pay

## 2021-04-29 ENCOUNTER — Ambulatory Visit (INDEPENDENT_AMBULATORY_CARE_PROVIDER_SITE_OTHER): Payer: Medicare Other

## 2021-04-29 DIAGNOSIS — J4550 Severe persistent asthma, uncomplicated: Secondary | ICD-10-CM

## 2021-05-04 ENCOUNTER — Other Ambulatory Visit: Payer: Self-pay | Admitting: Allergy & Immunology

## 2021-05-12 ENCOUNTER — Other Ambulatory Visit: Payer: Self-pay | Admitting: General Practice

## 2021-05-15 ENCOUNTER — Encounter: Payer: Self-pay | Admitting: Allergy & Immunology

## 2021-05-15 ENCOUNTER — Other Ambulatory Visit: Payer: Self-pay

## 2021-05-15 ENCOUNTER — Ambulatory Visit (INDEPENDENT_AMBULATORY_CARE_PROVIDER_SITE_OTHER): Payer: Medicare Other | Admitting: Allergy & Immunology

## 2021-05-15 VITALS — BP 126/72 | HR 64 | Temp 98.0°F | Resp 16 | Ht 75.0 in | Wt 216.2 lb

## 2021-05-15 DIAGNOSIS — K219 Gastro-esophageal reflux disease without esophagitis: Secondary | ICD-10-CM

## 2021-05-15 DIAGNOSIS — J4550 Severe persistent asthma, uncomplicated: Secondary | ICD-10-CM

## 2021-05-15 DIAGNOSIS — J3089 Other allergic rhinitis: Secondary | ICD-10-CM | POA: Diagnosis not present

## 2021-05-15 DIAGNOSIS — I2510 Atherosclerotic heart disease of native coronary artery without angina pectoris: Secondary | ICD-10-CM | POA: Diagnosis not present

## 2021-05-15 MED ORDER — PANTOPRAZOLE SODIUM 40 MG PO TBEC: 1.0000 | DELAYED_RELEASE_TABLET | Freq: Two times a day (BID) | ORAL | 9 refills | Status: DC

## 2021-05-15 MED ORDER — ALBUTEROL SULFATE (2.5 MG/3ML) 0.083% IN NEBU: 2.5000 mg | INHALATION_SOLUTION | RESPIRATORY_TRACT | 1 refills | Status: DC | PRN

## 2021-05-15 MED ORDER — BUDESONIDE-FORMOTEROL FUMARATE 160-4.5 MCG/ACT IN AERO: 2.0000 | INHALATION_SPRAY | Freq: Two times a day (BID) | RESPIRATORY_TRACT | 5 refills | Status: DC

## 2021-05-15 MED ORDER — IPRATROPIUM BROMIDE 0.06 % NA SOLN: 1.0000 | Freq: Four times a day (QID) | NASAL | 5 refills | Status: DC | PRN

## 2021-05-15 NOTE — Patient Instructions (Addendum)
1. Severe persistent asthma, uncomplicated - Lung testing looked very stable today. - We are not going to make any changes at this time. - Daily controller medication(s): Symbicort 160mg  two puffs twice daily with spacer + Fasenra every 8 weeks  - Prior to physical activity: albuterol 2 puffs 10-15 minutes before physical activity. - Rescue medications: albuterol 4 puffs every 4-6 hours as needed - Asthma control goals:  * Full participation in all desired activities (may need albuterol before activity) * Albuterol use two time or less a week on average (not counting use with activity) * Cough interfering with sleep two time or less a month * Oral steroids no more than once a year * No hospitalizations  2. Allergic rhinitis  - Continue with nasal ipratropium one spray per nostril up to four tines daily as needed for runny noses.   3. LPRD (laryngopharyngeal reflux disease) - Continue with pantoprazole 40mg  twice daily. - Continue with Pepcid twice daily.   4. Return in about 6 months (around 11/14/2021). You can get your COVID booster here in the clinic if you want.    Please inform of any Emergency Department visits, hospitalizations, or changes in symptoms. Call 14/01/2021 before going to the ED for breathing or allergy symptoms since we might be able to fit you in for a sick visit. Feel free to contact us anytime with any questions, problems, or concerns.  It was a pleasure to see you again today!  Websites that have reliable patient information: 1. American Academy of Asthma, Allergy, and Immunology: www.aaaai.org 2. Food Allergy Research and Education (FARE): foodallergy.org 3. Mothers of Asthmatics: http://www.asthmacommunitynetwork.org 4. American College of Allergy, Asthma, and Immunology: www.acaai.org   COVID-19 Vaccine Information can be found at: [place] For questions related to vaccine distribution or  appointments, please email [email or web address] or call [phone].   We realize that you might be concerned about having an allergic reaction to the COVID19 vaccines. To help with that concern, WE ARE OFFERING THE COVID19 VACCINES IN OUR OFFICE! Ask the front desk for dates!     "Like" [email or web address] on Facebook and Instagram for our latest updates!      A healthy democracy works best when [phone] participate! Make sure you are registered to vote! If you have moved or changed any of your contact information, you will need to get this updated before voting!  In some cases, you MAY be able to register to vote online: [place]

## 2021-05-15 NOTE — Progress Notes (Signed)
FOLLOW UP  Date of Service/Encounter:  05/15/21   Assessment:   Severe persistent asthma, uncomplicated (with two steroid bursts in the last six months) - ? antibodies to Nucala   Allergic rhinitis- needs to use the nasal atrovent more often  LPRD (laryngopharyngeal reflux disease)- with continued hoarseness despite changing reflux mediations at the last visit  Fully vaccinated to COVID19 with booster  Plan/Recommendations:   1. Severe persistent asthma, uncomplicated - Lung testing looked very stable today. - We are not going to make any changes at this time. - Daily controller medication(s): Symbicort 160mg  two puffs twice daily with spacer + Fasenra every 8 weeks  - Prior to physical activity: albuterol 2 puffs 10-15 minutes before physical activity. - Rescue medications: albuterol 4 puffs every 4-6 hours as needed - Asthma control goals:  * Full participation in all desired activities (may need albuterol before activity) * Albuterol use two time or less a week on average (not counting use with activity) * Cough interfering with sleep two time or less a month * Oral steroids no more than once a year * No hospitalizations  2. Allergic rhinitis  - Continue with nasal ipratropium one spray per nostril up to four tines daily as needed for runny noses.   3. LPRD (laryngopharyngeal reflux disease) - Continue with pantoprazole 40mg  twice daily. - Continue with Pepcid twice daily.   4. Return in about 6 months (around 11/14/2021). You can get your COVID booster here in the clinic if you want.    Subjective:   Louis Perez is a 70 y.o. male presenting today for follow up of  Chief Complaint  Patient presents with  . Asthma    No current issues. Says he uses is breathing treatment often in the past month. Stays indoors with high heat outside.    Louis Perez has a history of the following: Patient Active Problem List   Diagnosis Date Noted  . Severe  persistent asthma with acute exacerbation 11/25/2020  . Shortness of breath 11/25/2020  . Viral upper respiratory infection 11/25/2020  . Asthma, severe persistent, well-controlled 11/09/2018  . Other allergic rhinitis 11/09/2018  . LPRD (laryngopharyngeal reflux disease) 11/09/2018  . Stomatitis and mucositis 11/09/2018  . Pansinusitis 01/06/2017  . Hyperlipidemia LDL goal <70 07/23/2016  . Anginal chest pain at rest [facility]) 06/17/2016  . CAD in native artery 06/15/2016  . Stented coronary artery 06/15/2016  . Pain in the chest 05/28/2016  . Sinus bradycardia 05/15/2016  . Unstable angina (HCC) 05/14/2016  . CAD (coronary artery disease), native coronary artery 05/14/2016  . Dyslipidemia 09/28/2013  . GERD (gastroesophageal reflux disease) 09/28/2013  . 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 Moderate persistent chronic asthma with acute exacerbation 06/05/2013  . Essential hypertension 06/05/2013    History obtained from: chart review and patient.  Louis Perez is a 70 y.o. male presenting for a follow up visit. He was last seen in March 2022. At that time, we added on Alvesco two puffs BID. He was requiring more prednisone bursts even on the Nucala. We continued with Symbicort BID as well as Nucala monthly and Alvesco two puffs BID. For his allergic rhinitis, we continued with ipratropium one spray per nostril up to four times daily. For his LRPD, we continued with Protonix as well as Pepcid.   In the interim, he was changed to 66 from [place] in March 2022.   Since the last visit, he has done very well.  Asthma/Respiratory Symptom History: He remains on  the Symbicort 2 puffs twice daily.  He seems confused when I asked him about the Alvesco.  I guess he only use the Alvesco for a couple of weeks after I saw him last time.  We are doing this to try to prevent the need for systemic prednisone.  In the interim, he was changed from [place] to [place] because it was easier to get approved for Medicare.  He has been using his nebulizer 2-3 times at night. He might get short of breath intermittently but he never has attacks or anything like that. He does forget to use his albuterol prior to walking.   Allergic Rhinitis Symptom History: Allergic rhinitis seems well controlled with the Atrovent as needed.  He does not use it every day and certainly does not use it 3 times or more per day.  He has not needed antibiotics for any sinus infections.  GERD Symptom History: He remains on his Protonix as well as Pepcid.  He is going to see a dermatologist this week, in fact tomorrow, for a concerning lesion on his face.  He tells me it might be skin cancer, but he has not had a dermatologist look at it at all.  He does not use sunscreen like he should.  He remains an avid Designer, television/film set.  Otherwise, there have been no changes to his past medical history, surgical history, family history, or social history.    Review of Systems  Constitutional: Negative.  Negative for fever, malaise/fatigue and weight loss.  HENT: Negative.  Negative for congestion, ear discharge and ear pain.   Eyes: Negative for pain, discharge and redness.  Respiratory: Negative for cough, sputum production, shortness of breath and wheezing.   Cardiovascular: Negative.  Negative for chest pain and palpitations.  Gastrointestinal: Negative for abdominal pain, constipation, diarrhea, heartburn, nausea and vomiting.  Skin: Negative.  Negative for itching and rash.  Neurological: Negative for dizziness and headaches.  Endo/Heme/Allergies: Negative for environmental allergies. Does not bruise/bleed easily.       Objective:   Blood pressure 126/72, pulse 64, temperature 98 F (36.7 C), resp. rate 16, height 6\' 3"  (1.905 m), weight 216 lb 3.2 oz (98.1 kg), SpO2 95 %. Body mass index is 27.02 kg/m.   Physical Exam:  Physical Exam Constitutional:      Appearance: He is well-developed.     Comments: Pleasant male.  HENT:     Head:  Normocephalic and atraumatic.     Right Ear: Tympanic membrane, ear canal and external ear normal.     Left Ear: Tympanic membrane, ear canal and external ear normal.     Nose: No nasal deformity, septal deviation, mucosal edema or rhinorrhea.     Right Turbinates: Enlarged and swollen.     Left Turbinates: Enlarged and swollen.     Right Sinus: No maxillary sinus tenderness or frontal sinus tenderness.     Left Sinus: No maxillary sinus tenderness or frontal sinus tenderness.     Comments: Erythematous turbinates.  No discharge.    Mouth/Throat:     Mouth: Mucous membranes are not pale and not dry.     Pharynx: Uvula midline.  Eyes:     General:        Right eye: No discharge.        Left eye: No discharge.     Conjunctiva/sclera: Conjunctivae normal.     Right eye: Right conjunctiva is not injected. No chemosis.    Left eye: Left conjunctiva is not injected.  No chemosis.    Pupils: Pupils are equal, round, and reactive to light.  Cardiovascular:     Rate and Rhythm: Normal rate and regular rhythm.     Heart sounds: Normal heart sounds.  Pulmonary:     Effort: Pulmonary effort is normal. No tachypnea, accessory muscle usage or respiratory distress.     Breath sounds: Normal breath sounds. No wheezing, rhonchi or rales.  Chest:     Chest wall: No tenderness.  Lymphadenopathy:     Cervical: No cervical adenopathy.  Skin:    Coloration: Skin is not pale.     Findings: No abrasion, erythema, petechiae or rash. Rash is not papular, urticarial or vesicular.  Neurological:     Mental Status: He is alert.  Psychiatric:        Behavior: Behavior is cooperative.      Diagnostic studies:    Spirometry: results abnormal (FEV1: 2.43/62%, FVC: 3.15/59%, FEV1/FVC: 77%).    Spirometry consistent with possible restrictive disease. Overall his values are stable.    Allergy Studies: none           Malachi Bonds, MD  [facility]

## 2021-05-19 ENCOUNTER — Other Ambulatory Visit: Payer: Self-pay | Admitting: *Deleted

## 2021-05-19 MED ORDER — ALBUTEROL SULFATE (2.5 MG/3ML) 0.083% IN NEBU: 2.5000 mg | INHALATION_SOLUTION | RESPIRATORY_TRACT | 1 refills | Status: DC | PRN

## 2021-05-22 ENCOUNTER — Other Ambulatory Visit: Payer: Self-pay | Admitting: Physician Assistant

## 2021-05-22 DIAGNOSIS — R748 Abnormal levels of other serum enzymes: Secondary | ICD-10-CM

## 2021-05-26 ENCOUNTER — Other Ambulatory Visit: Payer: Medicare Other

## 2021-06-03 ENCOUNTER — Telehealth: Payer: Self-pay

## 2021-06-03 DIAGNOSIS — Z006 Encounter for examination for normal comparison and control in clinical research program: Secondary | ICD-10-CM

## 2021-06-03 NOTE — Telephone Encounter (Signed)
I called pt to remind him of his upcoming [facility] 4 appointment tomorrow Wednesday June 2nd at 8:30AM. I gave parking the parking code.

## 2021-06-04 ENCOUNTER — Other Ambulatory Visit: Payer: Self-pay

## 2021-06-04 ENCOUNTER — Encounter: Payer: Medicare Other | Admitting: *Deleted

## 2021-06-04 DIAGNOSIS — Z006 Encounter for examination for normal comparison and control in clinical research program: Secondary | ICD-10-CM

## 2021-06-04 NOTE — Research (Signed)
Patient came into the research clinic today for their follow-up visit number 3 in the [facility] [employer]. All concomitant medications have been reviewed and updated if applicable. There are no new AE's or SAE's to report to sponsor at this time. Subject had [place] samples and [place] samples drawn today in the lab. Subject receive their IP injection subcutaneously into their left lower abdomen and tolerated fine. Subject's next appointment is scheduled for Wednesday, December 7th, 2022 @ 0830 am.

## 2021-06-05 ENCOUNTER — Ambulatory Visit
Admission: RE | Admit: 2021-06-05 | Discharge: 2021-06-05 | Disposition: A | Discharge status: Home / Self Care | Payer: Medicare Other | Source: Ambulatory Visit | Attending: Physician Assistant | Admitting: Physician Assistant

## 2021-06-05 DIAGNOSIS — R748 Abnormal levels of other serum enzymes: Secondary | ICD-10-CM

## 2021-06-06 ENCOUNTER — Other Ambulatory Visit: Payer: Self-pay | Admitting: Physician Assistant

## 2021-06-06 DIAGNOSIS — M79661 Pain in right lower leg: Secondary | ICD-10-CM

## 2021-06-07 ENCOUNTER — Other Ambulatory Visit: Payer: Self-pay

## 2021-06-07 ENCOUNTER — Ambulatory Visit (HOSPITAL_COMMUNITY)
Admission: RE | Admit: 2021-06-07 | Discharge: 2021-06-07 | Disposition: A | Discharge status: Home / Self Care | Payer: Medicare Other | Source: Ambulatory Visit | Attending: Physician Assistant | Admitting: Physician Assistant

## 2021-06-07 DIAGNOSIS — M7989 Other specified soft tissue disorders: Secondary | ICD-10-CM | POA: Insufficient documentation

## 2021-06-07 DIAGNOSIS — M79661 Pain in right lower leg: Secondary | ICD-10-CM | POA: Insufficient documentation

## 2021-06-07 NOTE — Progress Notes (Signed)
Right lower extremity venous duplex completed. Refer to "CV Proc" under chart review to view preliminary results.  06/07/2021 10:17 AM Eula Fried., MHA, RVT, RDCS, RDMS

## 2021-06-11 ENCOUNTER — Other Ambulatory Visit: Payer: Self-pay | Admitting: Orthopedic Surgery

## 2021-06-11 DIAGNOSIS — M79661 Pain in right lower leg: Secondary | ICD-10-CM

## 2021-06-13 ENCOUNTER — Other Ambulatory Visit: Payer: Medicare Other

## 2021-06-16 ENCOUNTER — Other Ambulatory Visit: Payer: Self-pay | Admitting: Allergy & Immunology

## 2021-06-16 ENCOUNTER — Other Ambulatory Visit: Payer: Self-pay | Admitting: Internal Medicine

## 2021-06-23 DIAGNOSIS — J4550 Severe persistent asthma, uncomplicated: Secondary | ICD-10-CM | POA: Diagnosis not present

## 2021-06-24 ENCOUNTER — Other Ambulatory Visit: Payer: Self-pay

## 2021-06-24 ENCOUNTER — Ambulatory Visit (INDEPENDENT_AMBULATORY_CARE_PROVIDER_SITE_OTHER): Payer: Medicare Other

## 2021-06-24 ENCOUNTER — Ambulatory Visit: Payer: Self-pay

## 2021-06-24 DIAGNOSIS — J4550 Severe persistent asthma, uncomplicated: Secondary | ICD-10-CM | POA: Diagnosis not present

## 2021-06-25 ENCOUNTER — Other Ambulatory Visit: Payer: Self-pay | Admitting: Orthopedic Surgery

## 2021-06-25 DIAGNOSIS — M79661 Pain in right lower leg: Secondary | ICD-10-CM

## 2021-06-27 ENCOUNTER — Inpatient Hospital Stay: Admission: RE | Admit: 2021-06-27 | Payer: Medicare Other | Source: Ambulatory Visit

## 2021-06-28 ENCOUNTER — Ambulatory Visit
Admission: RE | Admit: 2021-06-28 | Discharge: 2021-06-28 | Disposition: A | Discharge status: Home / Self Care | Payer: Medicare Other | Source: Ambulatory Visit | Attending: Orthopedic Surgery | Admitting: Orthopedic Surgery

## 2021-06-28 ENCOUNTER — Other Ambulatory Visit: Payer: Self-pay

## 2021-06-28 DIAGNOSIS — M79661 Pain in right lower leg: Secondary | ICD-10-CM

## 2021-06-28 MED ORDER — GADOBENATE DIMEGLUMINE 529 MG/ML IV SOLN
20.0000 mL | Freq: Once | INTRAVENOUS | Status: AC | PRN
  Administered 2021-06-28: 20 mL via INTRAVENOUS

## 2021-07-10 ENCOUNTER — Encounter: Payer: Self-pay | Admitting: Neurology

## 2021-07-10 ENCOUNTER — Other Ambulatory Visit: Payer: Self-pay

## 2021-07-10 ENCOUNTER — Ambulatory Visit (INDEPENDENT_AMBULATORY_CARE_PROVIDER_SITE_OTHER): Payer: Medicare Other | Admitting: Neurology

## 2021-07-10 VITALS — BP 121/68 | HR 67 | Ht 75.0 in | Wt 204.4 lb

## 2021-07-10 DIAGNOSIS — M6258 Muscle wasting and atrophy, not elsewhere classified, other site: Secondary | ICD-10-CM

## 2021-07-10 DIAGNOSIS — I2510 Atherosclerotic heart disease of native coronary artery without angina pectoris: Secondary | ICD-10-CM

## 2021-07-10 NOTE — Progress Notes (Signed)
[facility] Clinic Note - Initial Visit   Date: 07/10/21  Louis Perez MRN: [id number] DOB: Feb 10, 1951   Dear Dr. Thurston Hole:  Thank you for your kind referral of Louis Perez for consultation of right leg muscle atrophy. Although his history is well known to you, please allow [place] to reiterate it for the purpose of our medical record. The patient was accompanied to the clinic by self.    History of Present Illness: Louis Perez is a 70 y.o. right-handed male with CAD s/p PCI, GERD, asthma, Gilbert's disease, s/p lumbar surgery (Dr. Jordan Likes) presenting for evaluation of right leg muscle atrophy. He is retired Environmental consultant  and was in his usual state of health until June, when he developed sudden onset of right posterior leg swelling.  He saw Dr. Thurston Hole who ordered US leg which was negative for DVT but noted a cystic mass in the right medial calf.  Follow-up MRI of the leg demonstrated severe muscle atrophy of the soleus and medial gastrocnemius muscle. He was referred here for further evaluation.  He has some numbness in the right toe which started following his knee surgery.  Occasionally, his leg will buckle, he has not had any falls and walks unassisted.  He is active and walks about 3-4 miles daily, enjoys playing golf.  He denies low back pain or radicular leg pain.    Out-side paper records, electronic medical record, and images have been reviewed where available and summarized as:  MRI right leg wwo contrast 06/30/2021: 1. Severe muscle atrophy of the right medial gastrocnemius muscle. Severe muscle atrophy of the right inferior aspect of the soleus muscle. Mild atrophy of the right inferior aspect of the lateral gastrocnemius muscle. No intramuscular mass or muscle edema. 2.  No acute osseous injury of the right tibia and fibula.    Past Medical History:  Diagnosis Date   Arthritis    "hips, lower back, knees, shoulders, hands"  (05/14/2016)   Asthma dx'd in 2014   Bacterial septicemia (HCC) 01/2011   "both knee involved"   CAD (coronary artery disease)    a. NSTEMI 02/2016 s/p DES to LAD. b. Relook cath due to angina/prior high risk anatomy 05/14/16 -> continued medical therapy given concern that a stent strut could inferere with Cx itself.   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    NSTEMI (non-ST elevated myocardial infarction) (HCC) 02/2016   [name] 03/02/2016   Recurrent upper respiratory infection (URI)    Sinus bradycardia    Unspecified tinnitus    right    Past Surgical History:  Procedure Laterality Date   BACK SURGERY     CARDIAC CATHETERIZATION N/A 03/01/2016   Procedure: Left Heart Cath and Coronary Angiography;  Surgeon: Tonny Bollman, MD;  oLAD 95%, CFX/RI/RCA/RPDA 25-30%   CARDIAC CATHETERIZATION N/A 03/01/2016   Procedure: Coronary Stent Intervention;  Surgeon: Tonny Bollman, MD;  Promus Premier 4.0 x  12 mm DES to oLAD   CARDIAC CATHETERIZATION N/A 05/14/2016   Procedure: Left Heart Cath and Coronary Angiography;  Surgeon: Lennette Bihari, MD;  Location: [facility] INVASIVE CV LAB;  Service: Cardiovascular;  Laterality: N/A;   CARDIAC CATHETERIZATION N/A 06/17/2016   Procedure: Intravascular Pressure Wire/FFR Study;  Surgeon: Tonny Bollman, MD;  Location: [facility] INVASIVE CV LAB;  Service: Cardiovascular;  Laterality: N/A;   CARDIOVASCULAR STRESS TEST  05/26/2012  EKG negative for ischemia, no ECG changes, no significant ischemia noted   I & D KNEE WITH POLY EXCHANGE Bilateral 01/2011   & complete synovectomy of 3 compartments of the right total knee/notes 02/04/2011 (replacement,partial knee replacement because of bacterial infection)   JOINT REPLACEMENT     KNEE ARTHROSCOPY Right ~ 2002   LUMBAR DISC SURGERY  ~ 1980; 2000   NASAL SINUS SURGERY  08/2011   SHOULDER ARTHROSCOPY W/ ROTATOR CUFF REPAIR Right 01/2015    TOTAL KNEE ARTHROPLASTY Bilateral 03/26/2004-09/15/2004    right-left   TRANSTHORACIC ECHOCARDIOGRAM  05/26/2012   EF >55%, mild concentric LVH     Medications:  Outpatient Encounter Medications as of 07/10/2021  Medication Sig   albuterol (PROVENTIL) (2.5 MG/3ML) 0.083% nebulizer solution Take 3 mLs (2.5 mg total) by nebulization every 4 (four) hours as needed for wheezing or shortness of breath.   aspirin 81 MG tablet Take 81 mg by mouth at bedtime.    atorvastatin (LIPITOR) 80 MG tablet TAKE 1 TABLET BY MOUTH ONCE DAILY AT  6  PM   budesonide-formoterol (SYMBICORT) 160-4.5 MCG/ACT inhaler Inhale 2 puffs into the lungs 2 (two) times daily. Rinse, gargle and spit out after use.   carvedilol (COREG) 3.125 MG tablet Take 1 tablet by mouth twice daily   famotidine (PEPCID) 20 MG tablet Take 2 tablets by mouth once daily   isosorbide mononitrate (IMDUR) 60 MG 24 hr tablet TAKE 1 & 1/2 (ONE & ONE-HALF) TABLETS BY MOUTH ONCE DAILY   Multiple Vitamin (MULTIVITAMIN WITH MINERALS) TABS tablet Take 1 tablet by mouth daily.   naproxen sodium (ALEVE) 220 MG tablet Take 220 mg by mouth daily as needed.   nitroGLYCERIN (NITROSTAT) 0.4 MG SL tablet Place 1 tablet (0.4 mg total) under the tongue every 5 (five) minutes as needed for chest pain.   olmesartan (BENICAR) 5 MG tablet Take 2 tablets by mouth once daily   OVER THE COUNTER MEDICATION ZZZQuil   pantoprazole (PROTONIX) 40 MG tablet Take 1 tablet (40 mg total) by mouth 2 (two) times daily.   ipratropium (ATROVENT) 0.06 % nasal spray Place 1 spray into the nose 4 (four) times daily as needed for rhinitis. (Patient not taking: Reported on 07/10/2021)   [DISCONTINUED] melatonin 5 MG TABS Take 5 mg by mouth. (Patient not taking: No sig reported)   No facility-administered encounter medications on file as of 07/10/2021.    Allergies:  Allergies  Allergen Reactions   Oxycodone Anaphylaxis and Swelling   Oxycodone-Acetaminophen Anaphylaxis    Tolerates  Vicodin   Oxycontin [Oxycodone Hcl] Anaphylaxis   Percocet [Oxycodone-Acetaminophen] Anaphylaxis    Tolerates Vicodin   Ambien [Zolpidem Tartrate] Other (See Comments)    Mood changes    Zolpidem Other (See Comments)    Mood changes     Family History: Family History  Problem Relation Age of Onset   Dementia Father    Alzheimer's disease Father    Heart disease Mother        Onset-Late 15s   Heart disease Brother 23   Cancer Paternal Aunt    Cancer Paternal Uncle    Cancer Paternal Uncle    Cancer Paternal Uncle     Social History: Social History   Tobacco Use   Smoking status: Former    Packs/day: 1.00    Years: 5.00    Pack years: 5.00    Types: Cigarettes    Quit date: 12/14/1968    Years since quitting: 52.6   Smokeless  tobacco: Never  Vaping Use   Vaping Use: Never used  Substance Use Topics   Alcohol use: Yes    Alcohol/week: 1.0 standard drink    Types: 1 Cans of beer per week    Comment: Occasional   Drug use: No   Social History   Social History Narrative   Patient lives at home with his wife . He is retired. Patient has 14 years of education. Patient has two children.   Caffeine - two cups daily.   Right-handed    Vital Signs:  BP 121/68   Pulse 67   Ht  (1.905 m)   Wt 204 lb 6 oz (92.7 kg)   SpO2 95%   BMI 25.55 kg/m   Neurological Exam: MENTAL STATUS including orientation to time, place, person, recent and remote memory, attention span and concentration, language, and fund of knowledge is normal.  Speech is not dysarthric.  CRANIAL NERVES: II:  No visual field defects.   III-IV-VI: Pupils equal round and reactive to light.  Normal conjugate, extra-ocular eye movements in all directions of gaze.  No nystagmus.  No ptosis V:  Normal facial sensation.    VII:  Normal facial symmetry and movements.   VIII:  Normal hearing and vestibular function.   IX-X:  Normal palatal movement.   XI:  Normal shoulder shrug and head rotation.   XII:   Normal tongue strength and range of motion, no deviation or fasciculation.  MOTOR:  No atrophy, fasciculations or abnormal movements.  No pronator drift.   Upper Extremity:  Right  Left  Deltoid  5/5   5/5   Biceps  5/5   5/5   Triceps  5/5   5/5   Infraspinatus 5/5  5/5  Medial pectoralis 5/5  5/5  Wrist extensors  5/5   5/5   Wrist flexors  5/5   5/5   Finger extensors  5/5   5/5   Finger flexors  5/5   5/5   Dorsal interossei  5/5   5/5   Abductor pollicis  5/5   5/5   Tone (Ashworth scale)  0  0   Lower Extremity:  Right  Left  Hip flexors  5/5   5/5   Hip extensors  5/5   5/5   Adductor 5/5  5/5  Abductor 5/5  5/5  Knee flexors  5/5   5/5   Knee extensors  5/5   5/5   Dorsiflexors  5/5   5/5   Plantarflexors  5/5   5/5   Toe extensors  5/5   5/5   Toe flexors  5/5   5/5   Tone (Ashworth scale)  0  0   MSRs:  Right        Left                  brachioradialis 2+  2+  biceps 2+  2+  triceps 2+  2+  patellar 2+  2+  ankle jerk 2+  2+  Hoffman no  no  plantar response down  down   SENSORY:  Normal and symmetric perception of light touch, pinprick, vibration, and proprioception.  Romberg's sign absent.   COORDINATION/GAIT: Normal finger-to- nose-finger and heel-to-shin.  Intact rapid alternating movements bilaterally.  Able to rise from a chair without using arms.  Gait narrow based and stable. Tandem and stressed gait intact.    IMPRESSION: Right medial gastrocnemius and soleus muscle atrophy on MRI, without clinical  signs of muscle weakness.  Imaging was personally viewed. Given selective posterior calf involvement, this may be due to old S1 radiculopathy.  I doubt this is a focal myopathy.  Patient does not have any weakness with plantar flexion.  I discussed that electrodiagnostic testing can help investigate his symptoms further. However, with patient being asymptomatic, he prefers to hold on additional testing. If he develops weakness or leg pain, he will contact  me so we can proceed with testing.     Thank you for allowing me to participate in patient's care.  If I can answer any additional questions, I would be pleased to do so.    Sincerely,    Louis Grosso K. Allena Katz, DO

## 2021-07-10 NOTE — Patient Instructions (Addendum)
Return to clinic if your symptoms get worse ?

## 2021-07-19 IMAGING — CR DG CHEST 2V
2 series · 2 of 2 positions shown · non-contrast
Comparison: 02/29/2016, 02/22/2017

CLINICAL DATA: Shortness of breath

EXAM:
CHEST - 2 VIEW

[w chest pa]
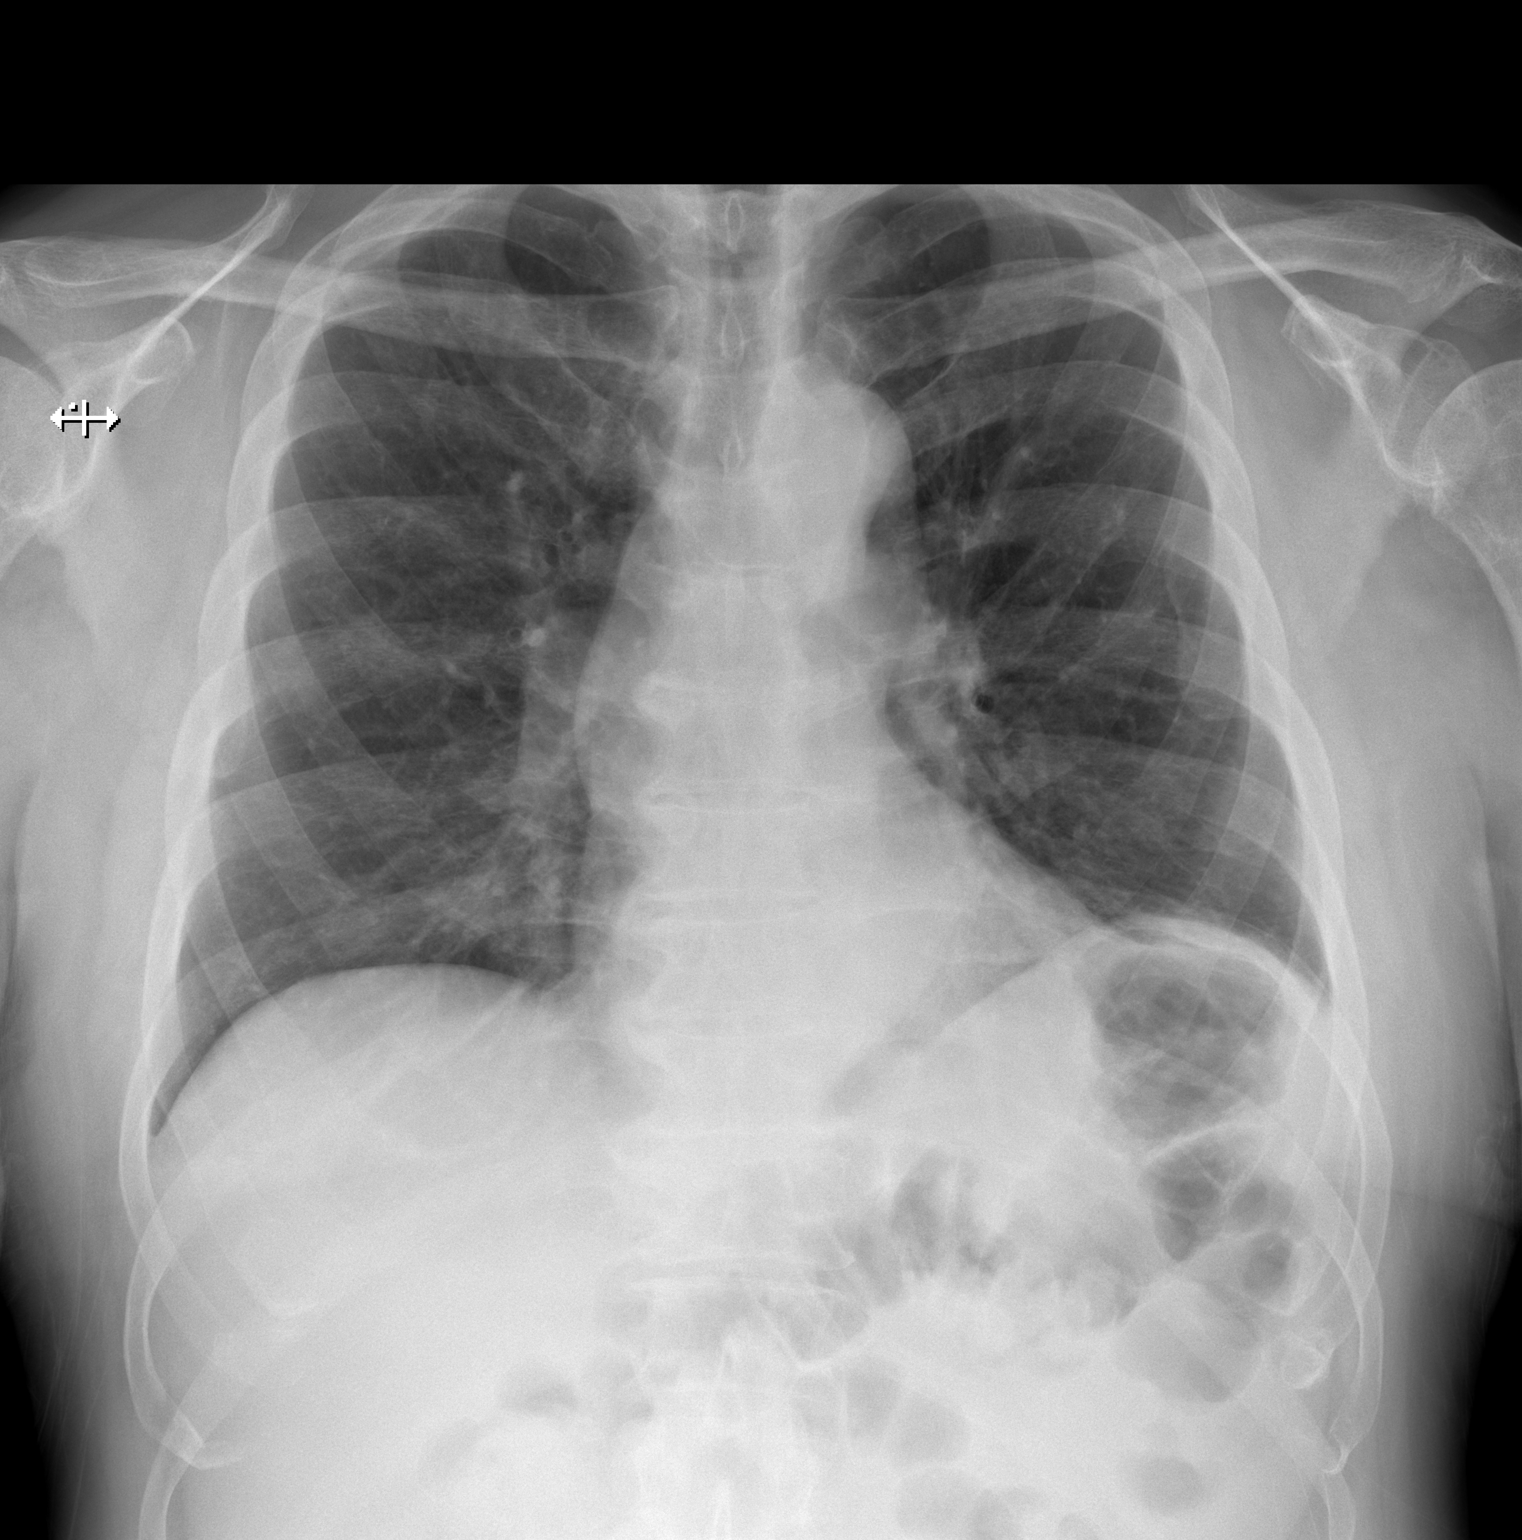

[w chest lat]
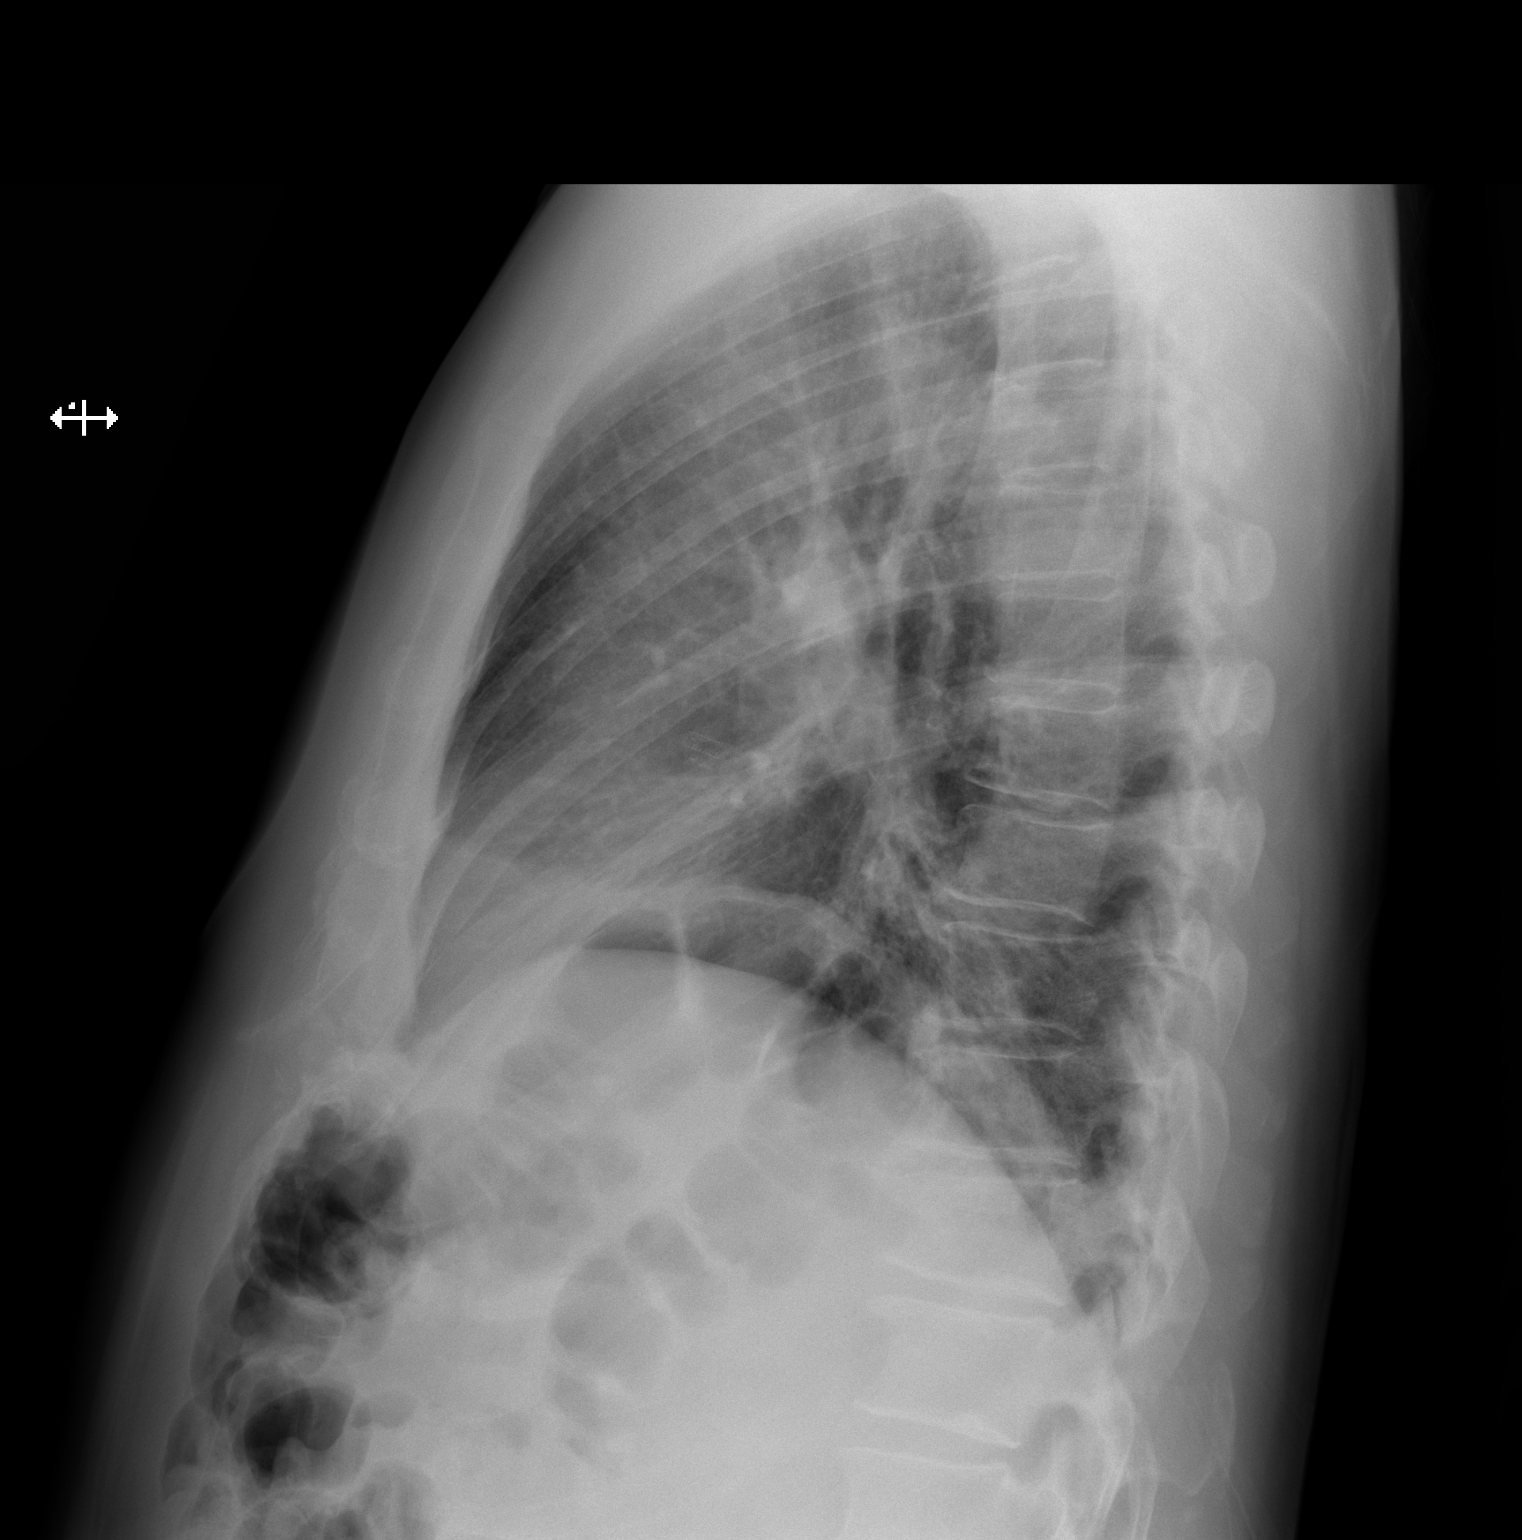

[2 of 2 positions shown; findings below may reference images not displayed]

FINDINGS: The heart size and mediastinal contours are within normal limits. No
focal airspace consolidation, pleural effusion, or pneumothorax. The
visualized skeletal structures are unremarkable.
IMPRESSION: No active cardiopulmonary disease.

## 2021-08-19 ENCOUNTER — Ambulatory Visit (INDEPENDENT_AMBULATORY_CARE_PROVIDER_SITE_OTHER): Payer: Medicare Other

## 2021-08-19 ENCOUNTER — Other Ambulatory Visit: Payer: Self-pay

## 2021-08-19 DIAGNOSIS — J4550 Severe persistent asthma, uncomplicated: Secondary | ICD-10-CM | POA: Diagnosis not present

## 2021-08-19 MED ORDER — BENRALIZUMAB 30 MG/ML ~~LOC~~ SOSY
30.0000 mg | PREFILLED_SYRINGE | SUBCUTANEOUS | Status: DC
  Administered 2021-08-19 – 2022-03-31 (×5): 30 mg via SUBCUTANEOUS

## 2021-10-05 ENCOUNTER — Other Ambulatory Visit: Payer: Self-pay | Admitting: Physician Assistant

## 2021-10-13 DIAGNOSIS — J4550 Severe persistent asthma, uncomplicated: Secondary | ICD-10-CM | POA: Diagnosis not present

## 2021-10-14 ENCOUNTER — Other Ambulatory Visit: Payer: Self-pay

## 2021-10-14 ENCOUNTER — Ambulatory Visit (INDEPENDENT_AMBULATORY_CARE_PROVIDER_SITE_OTHER): Payer: Medicare Other

## 2021-10-14 DIAGNOSIS — J4550 Severe persistent asthma, uncomplicated: Secondary | ICD-10-CM | POA: Diagnosis not present

## 2021-10-14 MED ORDER — BUDESONIDE-FORMOTEROL FUMARATE 160-4.5 MCG/ACT IN AERO: 2.0000 | INHALATION_SPRAY | Freq: Two times a day (BID) | RESPIRATORY_TRACT | 1 refills | Status: DC

## 2021-11-19 ENCOUNTER — Other Ambulatory Visit: Payer: Self-pay

## 2021-11-19 DIAGNOSIS — Z006 Encounter for examination for normal comparison and control in clinical research program: Secondary | ICD-10-CM

## 2021-11-19 NOTE — Research (Signed)
Patient was seen in clinic for Month 9 visit. Labs drawn per protocol; patient tolerated without complaints. IP given in the RLQ. Patient tolerated injection without any complications nor complaints. He denies any medicine changes and hospitalizations since his last visit. Next appointment made for Month 15 visit on 05/18/22 @ 8:30am.

## 2021-11-25 ENCOUNTER — Other Ambulatory Visit: Payer: Self-pay

## 2021-11-25 ENCOUNTER — Ambulatory Visit (INDEPENDENT_AMBULATORY_CARE_PROVIDER_SITE_OTHER): Payer: Medicare Other | Admitting: Allergy & Immunology

## 2021-11-25 ENCOUNTER — Encounter: Payer: Self-pay | Admitting: Allergy & Immunology

## 2021-11-25 VITALS — BP 142/90 | HR 68 | Temp 97.9°F | Resp 20 | Ht 75.0 in | Wt 213.8 lb

## 2021-11-25 DIAGNOSIS — J3089 Other allergic rhinitis: Secondary | ICD-10-CM

## 2021-11-25 DIAGNOSIS — K219 Gastro-esophageal reflux disease without esophagitis: Secondary | ICD-10-CM | POA: Diagnosis not present

## 2021-11-25 DIAGNOSIS — I2510 Atherosclerotic heart disease of native coronary artery without angina pectoris: Secondary | ICD-10-CM

## 2021-11-25 DIAGNOSIS — J4550 Severe persistent asthma, uncomplicated: Secondary | ICD-10-CM

## 2021-11-25 MED ORDER — CARBINOXAMINE MALEATE 4 MG PO TABS: 4.0000 mg | ORAL_TABLET | Freq: Two times a day (BID) | ORAL | 5 refills | Status: DC

## 2021-11-25 NOTE — Patient Instructions (Addendum)
1. Severe persistent asthma, uncomplicated - Lung testing looked very stable today. - Let's try the Symbicort ONE PUFF TWICE DAILY (to make it last a bit longer). - Daily controller medication(s): Symbicort 160mg  1-2 puffs twice daily with spacer + Fasenra every 8 weeks  - Prior to physical activity: albuterol 2 puffs 10-15 minutes before physical activity. - Rescue medications: albuterol 4 puffs every 4-6 hours as needed - Asthma control goals:  * Full participation in all desired activities (may need albuterol before activity) * Albuterol use two time or less a week on average (not counting use with activity) * Cough interfering with sleep two time or less a month * Oral steroids no more than once a year * No hospitalizations  2. Allergic rhinitis  - Continue with nasal ipratropium one spray per nostril up to four tines daily as needed for runny noses. - Add on carbinoxamine 4mg  up to twice daily to see if this can help with your runny nose. - It can cause some sleepiness.   3. LPRD (laryngopharyngeal reflux disease) - Continue with pantoprazole 40mg  twice daily. - Continue with Pepcid (famotidine) once daily.   4. Return in about 6 months (around 05/26/2022).    Please inform of any Emergency Department visits, hospitalizations, or changes in symptoms. Call before going to the ED for breathing or allergy symptoms since we might be able to fit you in for a sick visit. Feel free to contact 05/28/2022 anytime with any questions, problems, or concerns.  It was a pleasure to see you again today!  Websites that have reliable patient information: 1. American Academy of Asthma, Allergy, and Immunology: www.aaaai.org 2. Food Allergy Research and Education (FARE): foodallergy.org 3. Mothers of Asthmatics: http://www.asthmacommunitynetwork.org 4. American College of Allergy, Asthma, and Immunology: www.acaai.org   COVID-19 Vaccine Information can be found at:  [place] For questions related to vaccine distribution or appointments, please email [email or web address] or call [phone].   We realize that you might be concerned about having an allergic reaction to the COVID19 vaccines. To help with that concern, WE ARE OFFERING THE COVID19 VACCINES IN OUR OFFICE! Ask the front desk for dates!     "Like" [place] on Facebook and Instagram for our latest updates!      A healthy democracy works best when [email or web address] participate! Make sure you are registered to vote! If you have moved or changed any of your contact information, you will need to get this updated before voting!  In some cases, you MAY be able to register to vote online: [phone]

## 2021-11-25 NOTE — Progress Notes (Signed)
FOLLOW UP  Date of Service/Encounter:  11/25/21   Assessment:   Severe persistent asthma, uncomplicated  Mixed rhinitis - starting carbinoxamine twice daily as needed   LPRD (laryngopharyngeal reflux disease) - with continued hoarseness despite changing reflux mediations at the last visit   Fully vaccinated to COVID19 with booster  Plan/Recommendations:   1. Severe persistent asthma, uncomplicated - Lung testing looked very stable today. - Let's try the Symbicort ONE PUFF TWICE DAILY (to make it last a bit longer). - Daily controller medication(s): Symbicort 160mg  1-2 puffs twice daily with spacer + Fasenra every 8 weeks  - Prior to physical activity: albuterol 2 puffs 10-15 minutes before physical activity. - Rescue medications: albuterol 4 puffs every 4-6 hours as needed - Asthma control goals:  * Full participation in all desired activities (may need albuterol before activity) * Albuterol use two time or less a week on average (not counting use with activity) * Cough interfering with sleep two time or less a month * Oral steroids no more than once a year * No hospitalizations  2. Allergic rhinitis  - Continue with nasal ipratropium one spray per nostril up to four tines daily as needed for runny noses. - Add on carbinoxamine 4mg  up to twice daily to see if this can help with your runny nose. - It can cause some sleepiness.   3. LPRD (laryngopharyngeal reflux disease) - Continue with pantoprazole 40mg  twice daily. - Continue with Pepcid (famotidine) once daily.   4. Return in about 6 months (around 05/26/2022).   Subjective:   Louis Perez is a 70 y.o. male presenting today for follow up of  Chief Complaint  Patient presents with   Asthma    Act- 21    Louis Perez has a history of the following: Patient Active Problem List   Diagnosis Date Noted   Severe persistent asthma with acute exacerbation 11/25/2020   Shortness of breath 11/25/2020    Viral upper respiratory infection 11/25/2020   Asthma, severe persistent, well-controlled 11/09/2018   Other allergic rhinitis 11/09/2018   LPRD (laryngopharyngeal reflux disease) 11/09/2018   Stomatitis and mucositis 11/09/2018   Pansinusitis 01/06/2017   Hyperlipidemia LDL goal <70 07/23/2016   Anginal chest pain at rest [facility]) 06/17/2016   CAD in native artery 06/15/2016   Stented coronary artery 06/15/2016   Pain in the chest 05/28/2016   Sinus bradycardia 05/15/2016   Unstable angina (HCC) 05/14/2016   CAD (coronary artery disease), native coronary artery 05/14/2016   Dyslipidemia 09/28/2013   GERD (gastroesophageal reflux disease) 09/28/2013   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  Louis Perez is a 70 y.o. male presenting for a follow up visit.  He was last seen June 2022.  At that time, lung testing looks stable.  We continue with Symbicort 2 puffs twice daily as well as Fasenra every 8 weeks.  For his allergic rhinitis, we continue with nasal ipratropium.  We also continue with Protonix twice daily as well as Pepcid twice daily.  Since the last visit, he has done well. He was walking 4 miles per day and is now doing that 2-3 times a week depending on the weather.   Asthma/Respiratory Symptom History: He remains on the Symbicort. He is in the donut hole right now. He paid $90 last week.  He was doing the $30. Fasenra injections are free. He  has been doing the nebulizer a few times since the weather changes. He does this at night before he goes to bed.  Allergic Rhinitis Symptom History: Nasal symptoms are continuing to have issues, even with the nose spray. He does not like the nose spray at all. He has issues when he is out walking and when he is eating.  He is back right now town from allergies.  All of his family lives fairly close, so  they will spend the day together.  He remains active with golfing.  Otherwise, there have been no changes to his past medical history, surgical history, family history, or social history.    Review of Systems  Constitutional:  Negative for chills, fever, malaise/fatigue and weight loss.  HENT: Negative.  Negative for congestion, ear discharge and ear pain.   Eyes:  Negative for pain, discharge and redness.  Respiratory:  Negative for cough, sputum production, shortness of breath and wheezing.   Cardiovascular: Negative.  Negative for chest pain and palpitations.  Gastrointestinal:  Negative for abdominal pain, constipation, diarrhea, heartburn, nausea and vomiting.  Skin: Negative.  Negative for itching and rash.  Neurological:  Negative for dizziness and headaches.  Endo/Heme/Allergies:  Negative for environmental allergies. Does not bruise/bleed easily.      Objective:   Blood pressure (!) 142/90, pulse 68, temperature 97.9 F (36.6 C), temperature source Temporal, resp. rate 20, height 6\' 3"  (1.905 m), weight 213 lb 12.8 oz (97 kg), SpO2 97 %. Body mass index is 26.72 kg/m.   Physical Exam:  Physical Exam Vitals reviewed.  Constitutional:      Appearance: He is well-developed.     Comments: Pleasant male.  HENT:     Head: Normocephalic and atraumatic.     Right Ear: Tympanic membrane, ear canal and external ear normal.     Left Ear: Tympanic membrane, ear canal and external ear normal.     Nose: No nasal deformity, septal deviation, mucosal edema or rhinorrhea.     Right Turbinates: Enlarged, swollen and pale.     Left Turbinates: Enlarged, swollen and pale.     Right Sinus: No maxillary sinus tenderness or frontal sinus tenderness.     Left Sinus: No maxillary sinus tenderness or frontal sinus tenderness.     Comments: Erythematous turbinates.  No discharge.    Mouth/Throat:     Mouth: Mucous membranes are not pale and not dry.     Pharynx: Uvula midline.  Eyes:      General:        Right eye: No discharge.        Left eye: No discharge.     Conjunctiva/sclera: Conjunctivae normal.     Right eye: Right conjunctiva is not injected. No chemosis.    Left eye: Left conjunctiva is not injected. No chemosis.    Pupils: Pupils are equal, round, and reactive to light.  Cardiovascular:     Rate and Rhythm: Normal rate and regular rhythm.     Heart sounds: Normal heart sounds.  Pulmonary:     Effort: Pulmonary effort is normal. No tachypnea, accessory muscle usage or respiratory distress.     Breath sounds: Normal breath sounds. No wheezing, rhonchi or rales.     Comments: Moving air well in all lung fields.  No increased work of breathing. Chest:     Chest wall: No tenderness.  Lymphadenopathy:     Cervical: No cervical adenopathy.  Skin:    Coloration: Skin is not pale.  Findings: No abrasion, erythema, petechiae or rash. Rash is not papular, urticarial or vesicular.  Neurological:     Mental Status: He is alert.  Psychiatric:        Behavior: Behavior is cooperative.     Diagnostic studies:    Spirometry: results abnormal (FEV1: 2.35/60%, FVC: 2.93/55%, FEV1/FVC: 80%).    Spirometry consistent with possible restrictive disease.  Overall, values are currently stable.    Allergy Studies: none        Malachi Bonds, MD  [facility]

## 2021-11-26 NOTE — Addendum Note (Signed)
Addended by: Rolland Bimler D on: 11/26/2021 05:24 PM   Modules accepted: Orders

## 2021-12-05 DIAGNOSIS — J4550 Severe persistent asthma, uncomplicated: Secondary | ICD-10-CM | POA: Diagnosis not present

## 2021-12-09 ENCOUNTER — Ambulatory Visit (INDEPENDENT_AMBULATORY_CARE_PROVIDER_SITE_OTHER): Payer: Medicare Other

## 2021-12-09 ENCOUNTER — Other Ambulatory Visit: Payer: Self-pay

## 2021-12-09 DIAGNOSIS — J4550 Severe persistent asthma, uncomplicated: Secondary | ICD-10-CM

## 2022-01-19 ENCOUNTER — Other Ambulatory Visit: Payer: Self-pay | Admitting: Internal Medicine

## 2022-01-27 IMAGING — US US ABDOMEN LIMITED
1 series · 14 of 25 positions shown · non-contrast
Comparison: Ultrasound 02/22/2017

CLINICAL DATA: Elevated LFT

EXAM:
ULTRASOUND ABDOMEN LIMITED RIGHT UPPER QUADRANT

[Series 1: us abdomen limited · 0.20mm/px · 14 of 56 slices shown]
[im 1/56]
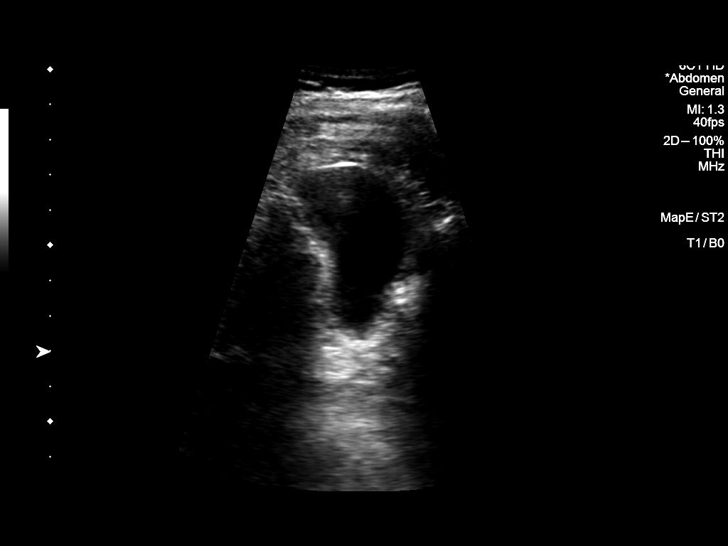
[im 5/56]
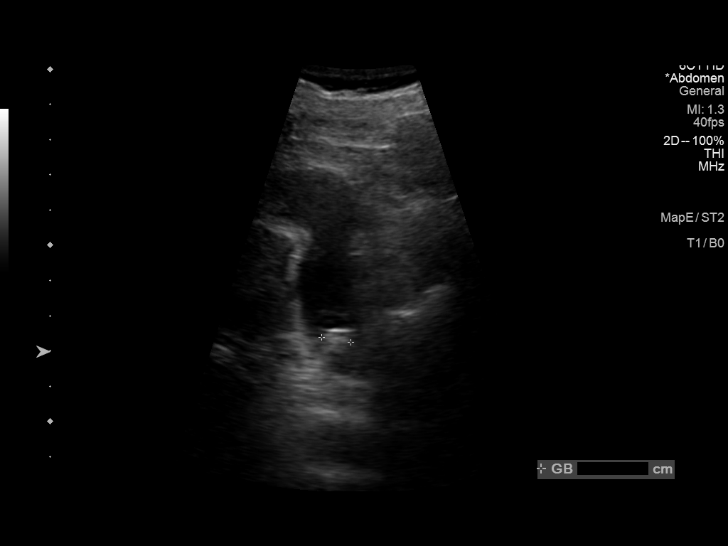
[im 10/56]
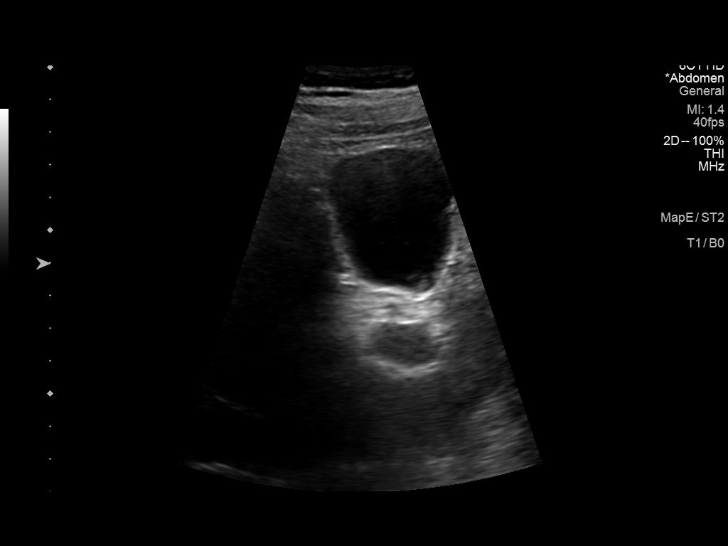
[im 14/56]
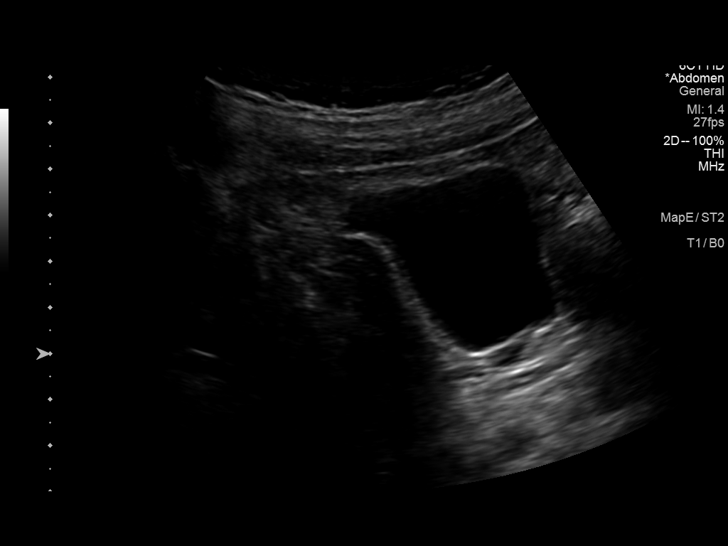
[im 19/56]
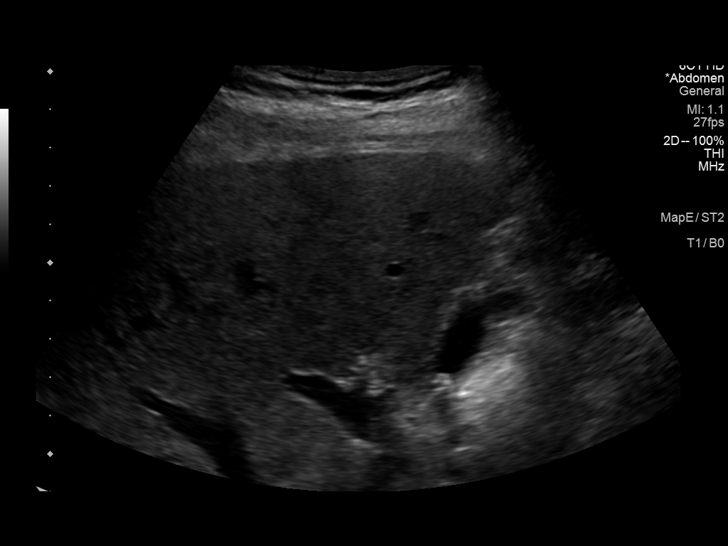
[im 21/56]
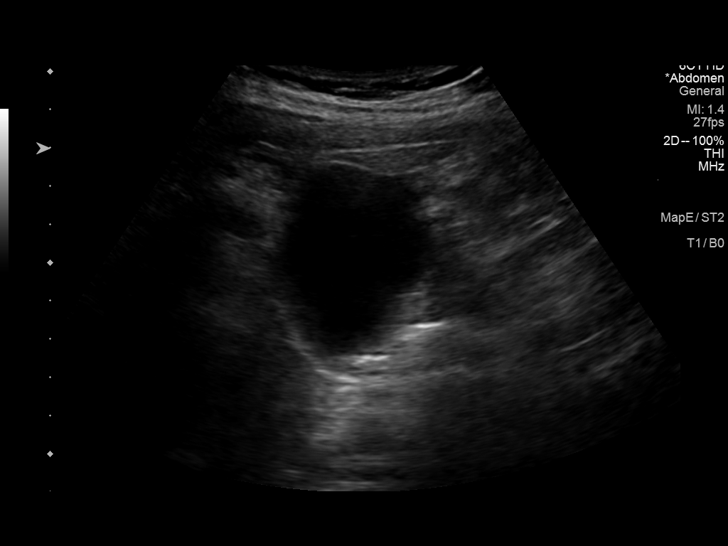
[im 26/56]
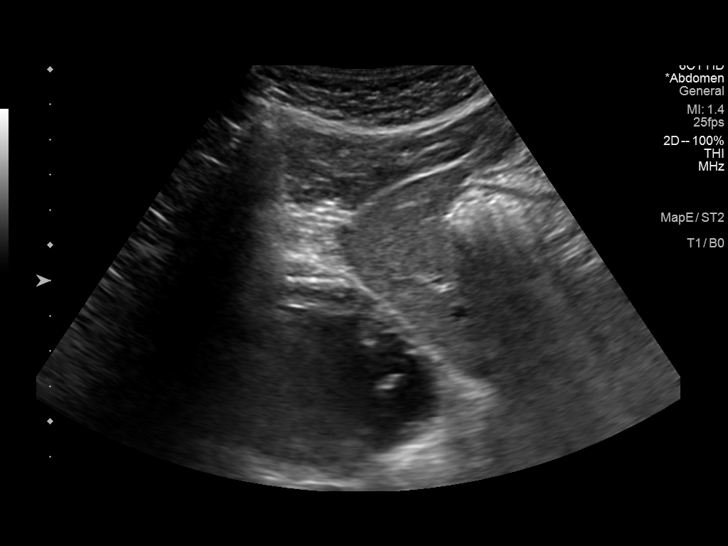
[im 30/56]
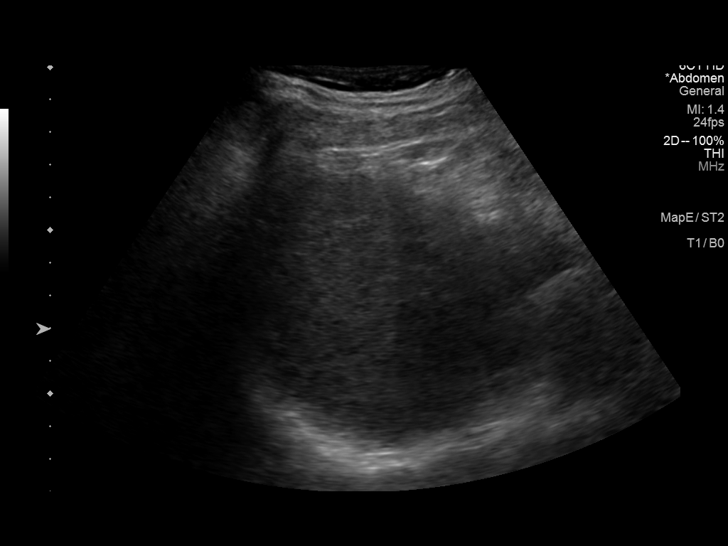
[im 35/56]
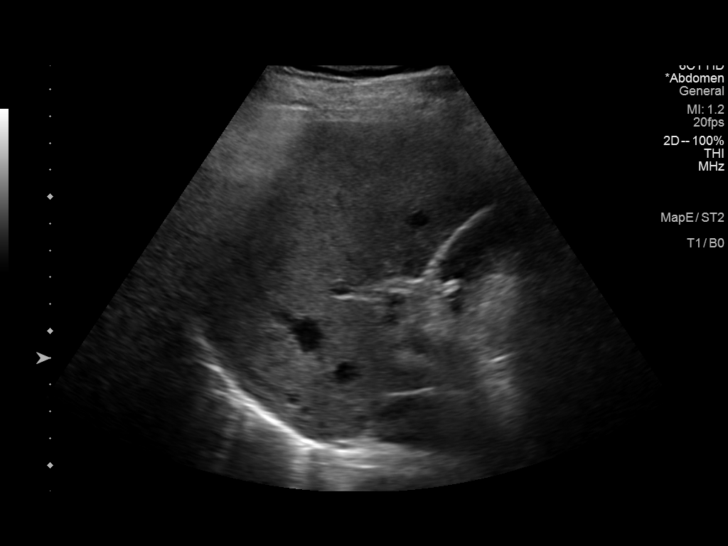
[im 37/56]
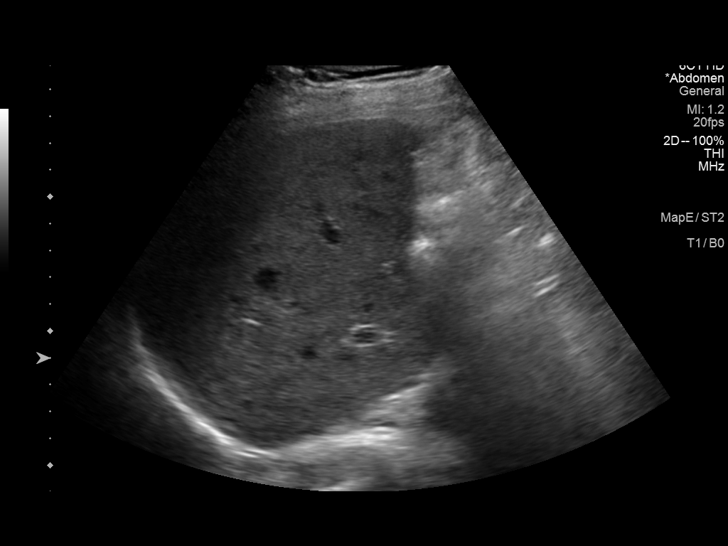
[im 42/56]
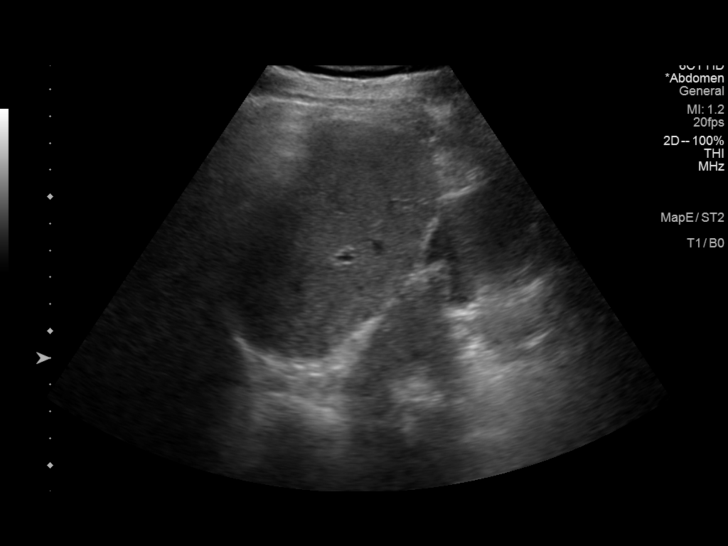
[im 46/56]
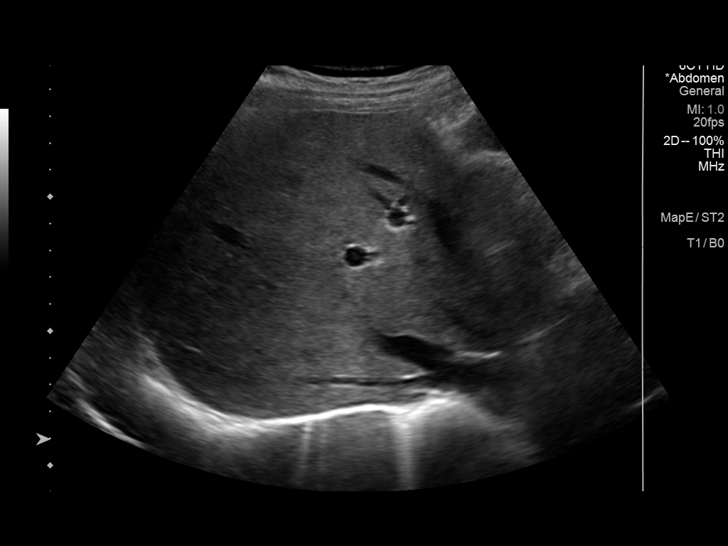
[im 51/56]
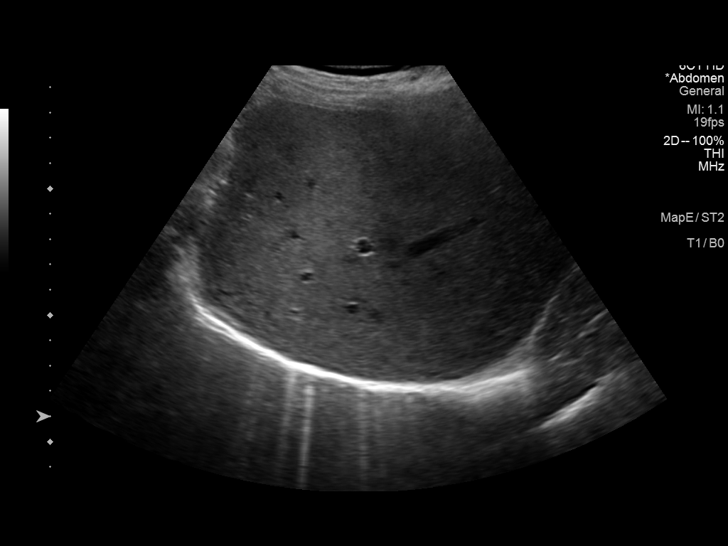
[im 56/56]
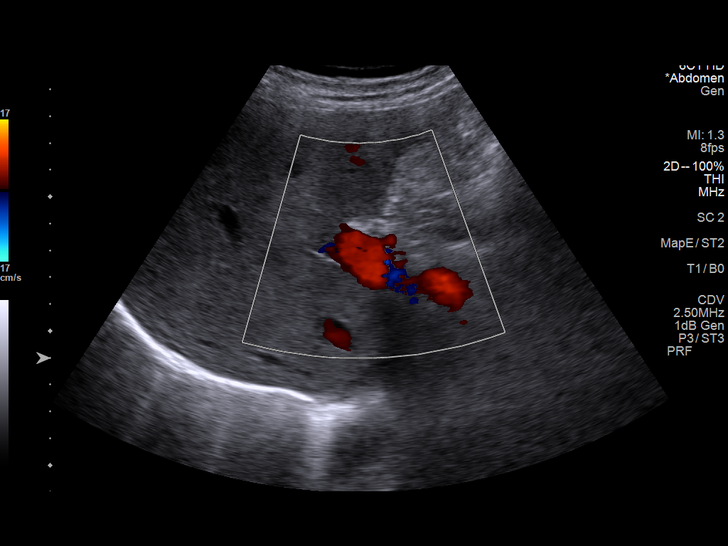

[14 of 25 positions shown; findings below may reference images not displayed]

FINDINGS: Gallbladder:

Small gallstone. Normal wall thickness. Negative sonographic Murphy

Common bile duct:

Diameter: 3 mm

Liver:

No focal lesion identified. Within normal limits in parenchymal
echogenicity. Portal vein is patent on color Doppler imaging with
normal direction of blood flow towards the liver.

Other: None.
IMPRESSION: 1. Cholelithiasis without sonographic evidence for acute
cholecystitis.
2. Otherwise negative right upper quadrant abdominal ultrasound

## 2022-02-02 DIAGNOSIS — J4550 Severe persistent asthma, uncomplicated: Secondary | ICD-10-CM | POA: Diagnosis not present

## 2022-02-03 ENCOUNTER — Other Ambulatory Visit: Payer: Self-pay

## 2022-02-03 ENCOUNTER — Ambulatory Visit (INDEPENDENT_AMBULATORY_CARE_PROVIDER_SITE_OTHER): Payer: Medicare Other

## 2022-02-03 DIAGNOSIS — J4550 Severe persistent asthma, uncomplicated: Secondary | ICD-10-CM

## 2022-02-24 ENCOUNTER — Telehealth: Payer: Self-pay | Admitting: Internal Medicine

## 2022-02-24 NOTE — Telephone Encounter (Signed)
Pt c/o BP issue: STAT if pt c/o blurred vision, one-sided weakness or slurred speech ? ?1. What are your last 5 BP readings?  ?138/79 ?127/80 ?149/91 ?158/92 ?133/84 ?127/77 ?165/94 ?164/105 ?105/66 ?116/68 ? ?2. Are you having any other symptoms (ex. Dizziness, headache, blurred vision, passed out)? Last week he said he was having check pain, some minor ones. Feels a little dizzy when he gets up, and has a slight headache when it up over 100's the bottom number.  ? ?3. What is your BP issue? Patient states in the evenings and at night he has been getting a lot of high BP reading.   ?

## 2022-02-24 NOTE — Telephone Encounter (Signed)
Patient reports elevation in BP from 105/66 - 182/111. He stated BP goes up in the evenings. While on the phone with me, BP 165/99, P 69. Denies chest pain/pressure/tightness. Does have slight headache and slight dizziness. He has been drinking caffeine, but watches sodium and fat intake. Explained that he needs to watch sodium intake as well. While reviewing cardiac meds, he stated he thought he was to be taking Imdur 90 mg each day, but the pharmacy filled it as 60 mg per day. Dr. Bjorn Pippin (DOD) recommended I forward this to Dr. Rennis Golden. Please advise on BP and Imdur. ?

## 2022-02-25 ENCOUNTER — Other Ambulatory Visit: Payer: Self-pay

## 2022-02-25 MED ORDER — ISOSORBIDE MONONITRATE ER 60 MG PO TB24: 60.0000 mg | ORAL_TABLET | Freq: Every day | ORAL | 11 refills | Status: DC

## 2022-02-25 MED ORDER — OLMESARTAN MEDOXOMIL 5 MG PO TABS: 10.0000 mg | ORAL_TABLET | Freq: Two times a day (BID) | ORAL | 7 refills | Status: DC

## 2022-02-25 NOTE — Telephone Encounter (Signed)
Spoke with patient and wished him a happy birthday. Informed patient of the medication orders: Imdur 60 mg daily and olmesartan 10 mg twice a day. He had no questions or concerns at this time. Prescriptions sent to pt pharmacy. ?

## 2022-02-25 NOTE — Telephone Encounter (Signed)
60 mg imdur daily is fine - he is on a low dose of the olmesartan - would recommend increasing the dose to 10 mg twice daily (BID). ? ?Dr. Lemmie Evens ?

## 2022-03-03 DIAGNOSIS — Z006 Encounter for examination for normal comparison and control in clinical research program: Secondary | ICD-10-CM

## 2022-03-03 MED ORDER — STUDY - ORION 4 - INCLISIRAN 300 MG/1.5 ML OR PLACEBO SQ INJECTION (PI-STUCKEY): 300.0000 mg | INJECTION | SUBCUTANEOUS | Status: DC

## 2022-03-09 ENCOUNTER — Telehealth: Payer: Self-pay | Admitting: Internal Medicine

## 2022-03-09 MED ORDER — CARVEDILOL 6.25 MG PO TABS: 6.2500 mg | ORAL_TABLET | Freq: Two times a day (BID) | ORAL | 3 refills | Status: DC

## 2022-03-09 MED ORDER — OLMESARTAN MEDOXOMIL 40 MG PO TABS: 40.0000 mg | ORAL_TABLET | Freq: Every day | ORAL | 3 refills | Status: DC

## 2022-03-09 NOTE — Telephone Encounter (Signed)
Pt advised Dr. Blanchie Dessert recommendations and will call us by mid week and let us know how he is doing..he will go to the ED if anything changes or worsens.  ?

## 2022-03-09 NOTE — Telephone Encounter (Signed)
We can try and increase his carvedilol to 6.25 mg BID - he could take an extra carvedilol this morning and start with 2 tablets twice daily tonight. Continue with the Benicar - I believe he is on 10 mg twice daily ( not sure why he has 5 mg tabs ) - max dose of Benicar is 40 mg daily. ? ?Dr. Rexene Edison ?

## 2022-03-09 NOTE — Telephone Encounter (Signed)
? ? ?  Pt c/o BP issue: STAT if pt c/o blurred vision, one-sided weakness or slurred speech ? ?1. What are your last 5 BP readings?  ?163/94 ?158/91 ?147/96 ?159/95 ?166/105 ?179/106 ?171/104 ?173/111 ?154/94 ?153/90 ? ?2. Are you having any other symptoms (ex. Dizziness, headache, blurred vision, passed out)? Headache and Blurry vision on R side ? ?3. What is your BP issue? Patient is still concerned about his BP. He can't bring it down even after taking extra medicine  ?

## 2022-03-09 NOTE — Telephone Encounter (Signed)
Pt called tp report that he has been having fluctuating BP readings: ? ?What are your last 5 BP readings?  ?163/94 ?158/91 ?147/96 ?159/95 ?166/105 ?179/106 ?171/104 ?173/111 ?154/94 ?153/90 ? ?He says today he took his meds at 7:30 am and his BP now is 178/103... he says he has been having headache, dizziness, and some blurred vision but these symptoms are not new they happen every time his BP is "up"... he denies weakness on one side, no slurred speech... his wife is home with him. ? ?He denies chest pain, no SOB. He also denies added NA intake.  ? ?He last saw Dr. Rennis Golden 04/2021 and his next OV 04/2022.  ? ?Will forward to Dr. Rennis Golden and his nurse for review.  ? ?I urged the pt to call EMS if his symptoms worsen or change and he declined..I advised him the rock of CVA with an elevated BP and that his symptoms are worrisome. Marland Kitchen  ?

## 2022-03-12 ENCOUNTER — Telehealth: Payer: Self-pay | Admitting: Internal Medicine

## 2022-03-12 DIAGNOSIS — Z006 Encounter for examination for normal comparison and control in clinical research program: Secondary | ICD-10-CM

## 2022-03-12 MED ORDER — STUDY - ORION 4 - INCLISIRAN 300 MG/1.5 ML OR PLACEBO SQ INJECTION (PI-STUCKEY): 300.0000 mg | INJECTION | SUBCUTANEOUS | 1 refills | Status: DC

## 2022-03-12 NOTE — Telephone Encounter (Signed)
Called patient, he just called in on 03/27- on Monday with BP concerns. Dr.[name] increase Carvedilol to 6.25 mg twice daily, and to continue Benicar.  ? ?Patient states that the blood pressures have been better during morning, and day time, but the night time blood pressures are still high and he is not sure that it is working.  ? ?Monday- at 9:45 PM (after taking medications for PM) BP was 165/102 ?Tuesday- AM (before medications) 145/102, 1 hour after taking medications 114/67, 2:00 PM 112/65, around 6:00 PM (took PM medications) 123/86, 9:00 PM 147/106, and then at 10:15 PM before bed 159/105. ?Wednesday- AM (before medications) 134/96, 8:30 AM (1 hour after taking medications) 113/76, did not recheck until around 4:00 PM 118/79, 6:00 PM (took PM medications) 127/85, 8:30 PM 130/88, then 11:30 PM 172/102. ?Thursday- AM 138/94.  ? ?Patient denies any changes, we did discuss diet and snacks before bedtime, made aware changes recently made on Monday are still in effect, BP changes are not immediate. Patient verbalized understanding of this, but the PM blood pressures still have him concerned. No symptoms mentioned per patient. ? ?I advised I would send a message to MD/PHARMD to give further recommendations-  ?Thanks! ?

## 2022-03-12 NOTE — Telephone Encounter (Signed)
Patient calling backing regards to his bp saying the medication isnt working. Please advise ?

## 2022-03-17 NOTE — Telephone Encounter (Signed)
Called patient- he states he is currently doing that, he did not switch over to doing the 40 mg daily, since he had the 20 mg's at home. He did state the blood pressures have been better.  ? ?Yesterday before medications 139/92, then 126/86, 124/84, 112/78 and at night 125/83.  ? ?Patient states the lowest was 85/65- he did have dizziness with this, but then it increased.  ? ?I advised patient to continue to take what he was taking, continue to monitor- call back if BP causes any other issues/concerns. Advised if consistent low blood pressure readings occur to let us know as well.  ? ?Patient verbalized understanding, thankful for call back. Advised I would notify MD of this as well.  ? ?

## 2022-03-18 NOTE — Telephone Encounter (Signed)
thanks

## 2022-03-23 NOTE — Telephone Encounter (Signed)
Pt c/o BP issue: STAT if pt c/o blurred vision, one-sided weakness or slurred speech ? ?1. What are your last 5 BP readings?  ? ?4/08: 162/115 ?          200/117 ? ?4/09: 185/110 ? ?4/10: 164/107 ?         118/85 after medication ? ?2. Are you having any other symptoms (ex. Dizziness, headache, blurred vision, passed out)?  ?Dizziness, headache  ? ?3. What is your BP issue?  ? ?Patient is following up. He states his BP was elevated over the weekend. ? ?

## 2022-03-23 NOTE — Telephone Encounter (Signed)
Spoke to patient he stated his B/P has been elevated.Readings listed below.Stated he notices B/P elevated at night.B/P at present 108/91.Appointment scheduled with Gillian Shields NP 4/13 at 1:55 pm at [facility] location.Advised to bring a list of all medications and B/P readings.Advised Dr.[name] is out of office this week.I will make him aware. ?

## 2022-03-24 NOTE — Progress Notes (Signed)
? ? ?Office Visit  ?  ?Patient Name: Louis Perez ?Date of Encounter: 03/24/2022 ? ?Primary Care Provider:  [place], [facility] ?Primary Cardiologist:  Chrystie NoseKenneth C [name], MD ?Primary Electrophysiologist: None ?Chief Complaint  ?  ?Follow-up for elevated blood pressure and CAD ? ? Patient Profile: ?CAD s/p NSTEMI 3/22 treated with DES to LAD ?HTN ?HLD ?Gilbert's disease ?Asthma ? ? Recent Studies: ?12/20 Myoview: EF 60% low risk study ?4/21 TTE: EF 60-65% grade 1 DD aortic root measuring at 41 mm ?3/17 LHC: 95% ostial LAD treated with DES, mid RCA 30%, distal RCA 30%, ostial RPDA lesion 50% distal LAD lesion ? ?History of Present Illness  ?  ?Louis Perez is a 71 y.o. male with PMH of CAD s/p NSTEMI 3/22, HTN, HLD, Gilbert's disease, asthma.  LHC performed 3/17 with LAD lesion 95% ostial, treated with DES, 25% mid RCA, 30% distal RCA, ostial RPDA lesion, 50% distal LAD lesion. Cath was repeated 6/17 due to recurrence of symptoms and revealed 80% ramus lesion, 20% distal RCA lesion.  FFR of ostial ramus lesion was 0.92, 50% mid to distal LAD lesion.  Managed with medical therapy, echo completed 4/21 with EF 60%, grade 1 DD, aortic root measuring 41 mm. ? ?In January 2022 patient contacted cardiology services with complaint of borderline low BP.  He was also experiencing dizziness, he was instructed to reduce olmesartan from 5 mg twice daily to 5 mg daily.  Patient was last seen by Dr. Rennis GoldenHilty on 04/2021 and seen in follow-up for [facility].  During visit he denied chest pain or any shortness of breath with EKG is normal sinus rhythm. ? ?Since last being seen in our clinic Louis Perez reports that his blood pressures are remaining elevated.He recently reached out to Dr. Blanchie DessertHilty's nurse on 3/15 with complaint of elevated blood pressure.  Systolics were running from the 140s to 170s over 90s to 100s.  His olmesartan was increased to 40 mg and Coreg was increased to  6.25 twice daily.  Today on exam patient states that blood pressure are still remaining elevated and he is also reporting myalgias in his shoulders and lower hips since increasing the olmesartan.  He is also endorsing headache in front part of his head when blood pressures are elevated in the evenings. He is currently on atorvastatin 80 mg and is participating in the [place]. He denies chest pain, palpitations, dyspnea, PND, orthopnea, nausea, vomiting, dizziness, syncope, edema, weight gain, or early satiety. ? ? ?Past Medical History  ?  ?Past Medical History:  ?Diagnosis Date  ? Arthritis   ? "hips, lower back, knees, shoulders, hands" (05/14/2016)  ? Asthma dx'd in 2014  ? Bacterial septicemia (HCC) 01/2011  ? "both knee involved"  ? CAD (coronary artery disease)   ? a. NSTEMI 02/2016 s/p DES to LAD. b. Relook cath due to angina/prior high risk anatomy 05/14/16 -> continued medical therapy given concern that a stent strut could inferere with Cx itself.  ? GERD (gastroesophageal reflux disease)   ? Gilbert's disease   ? Hyperlipidemia   ? Hypertension   ? 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 NSTEMI (non-ST elevated myocardial infarction) (HCC) 02/2016  ? [name]/notes 03/02/2016  ? Recurrent upper respiratory infection (URI)   ? Sinus bradycardia   ? Unspecified tinnitus   ? right  ? ?Past Surgical History:  ?Procedure Laterality Date  ? BACK SURGERY    ? CARDIAC CATHETERIZATION N/A  03/01/2016  ? Procedure: Left Heart Cath and Coronary Angiography;  Surgeon: Tonny Bollman, MD;  oLAD 95%, CFX/RI/RCA/RPDA 25-30%  ? CARDIAC CATHETERIZATION N/A 03/01/2016  ? Procedure: Coronary Stent Intervention;  Surgeon: Tonny Bollman, MD;  Promus Premier 4.0 x  12 mm DES to oLAD  ? CARDIAC CATHETERIZATION N/A 05/14/2016  ? Procedure: Left Heart Cath and Coronary Angiography;  Surgeon: Lennette Bihari, MD;  Location: [facility] INVASIVE CV LAB;  Service: Cardiovascular;  Laterality: N/A;  ? CARDIAC CATHETERIZATION N/A 06/17/2016  ? Procedure:  Intravascular Pressure Wire/FFR Study;  Surgeon: Tonny Bollman, MD;  Location: [facility] INVASIVE CV LAB;  Service: Cardiovascular;  Laterality: N/A;  ? CARDIOVASCULAR STRESS TEST  05/26/2012  ? EKG negative for ischemia, no ECG changes, no significant ischemia noted  ? I & D KNEE WITH POLY EXCHANGE Bilateral 01/2011  ? & complete synovectomy of 3 compartments of the right total knee/notes 02/04/2011 (replacement,partial knee replacement because of bacterial infection)  ? JOINT REPLACEMENT    ? KNEE ARTHROSCOPY Right ~ 2002  ? LUMBAR DISC SURGERY  ~ 1980; 2000  ? NASAL SINUS SURGERY  08/2011  ? SHOULDER ARTHROSCOPY W/ ROTATOR CUFF REPAIR Right 01/2015  ? TOTAL KNEE ARTHROPLASTY Bilateral 03/26/2004-09/15/2004  ?  right-left  ? TRANSTHORACIC ECHOCARDIOGRAM  05/26/2012  ? EF >55%, mild concentric LVH  ? ? ?Allergies ? ?Allergies  ?Allergen Reactions  ? Oxycodone Anaphylaxis and Swelling  ? Oxycodone-Acetaminophen Anaphylaxis  ?  Tolerates Vicodin  ? Oxycontin [Oxycodone Hcl] Anaphylaxis  ? Percocet [Oxycodone-Acetaminophen] Anaphylaxis  ?  Tolerates Vicodin  ? Ambien [Zolpidem Tartrate] Other (See Comments)  ?  Mood changes   ? Zolpidem Other (See Comments)  ?  Mood changes   ? ? ?Home Medications  ?  ?Current Outpatient Medications  ?Medication Sig Dispense Refill  ? albuterol (PROVENTIL) (2.5 MG/3ML) 0.083% nebulizer solution Take 3 mLs (2.5 mg total) by nebulization every 4 (four) hours as needed for wheezing or shortness of breath. 75 mL 1  ? aspirin 81 MG tablet Take 81 mg by mouth at bedtime.     ? atorvastatin (LIPITOR) 80 MG tablet TAKE 1 TABLET BY MOUTH ONCE DAILY AT  6  PM 90 tablet 3  ? budesonide-formoterol (SYMBICORT) 160-4.5 MCG/ACT inhaler Inhale 2 puffs into the lungs 2 (two) times daily. Rinse, gargle and spit out after use. 10.2 g 1  ? Carbinoxamine Maleate 4 MG TABS Take 1 tablet (4 mg total) by mouth in the morning and at bedtime. 60 tablet 5  ? carvedilol (COREG) 6.25 MG tablet Take 1 tablet (6.25 mg total) by  mouth 2 (two) times daily. 180 tablet 3  ? cyproheptadine (PERIACTIN) 4 MG tablet Take by mouth.    ? dexamethasone (DECADRON) 0.5 MG tablet 0.5 tablet    ? famotidine (PEPCID) 20 MG tablet Take 2 tablets by mouth once daily 180 tablet 1  ? ipratropium (ATROVENT) 0.06 % nasal spray Place 1 spray into the nose 4 (four) times daily as needed for rhinitis. (Patient not taking: Reported on 07/10/2021) 15 mL 5  ? isosorbide mononitrate (IMDUR) 60 MG 24 hr tablet Take 1 tablet (60 mg total) by mouth daily. Keep upcoming appointment for future refills. 30 tablet 11  ? Multiple Vitamin (MULTIVITAMIN WITH MINERALS) TABS tablet Take 1 tablet by mouth daily.    ? naproxen sodium (ALEVE) 220 MG tablet Take 220 mg by mouth daily as needed.    ? nitroGLYCERIN (NITROSTAT) 0.4 MG SL tablet Place 1 tablet (0.4 mg  total) under the tongue every 5 (five) minutes as needed for chest pain. 25 tablet 3  ? olmesartan (BENICAR) 40 MG tablet Take 1 tablet (40 mg total) by mouth daily. 90 tablet 3  ? OVER THE COUNTER MEDICATION ZZZQuil    ? pantoprazole (PROTONIX) 40 MG tablet Take 1 tablet (40 mg total) by mouth 2 (two) times daily. 60 tablet 9  ? Study - ORION 4 - inclisiran 300 mg/1.65mL or placebo SQ injection (PI-Stuckey) Inject 1.5 mLs (300 mg total) into the skin every 6 (six) months. 1 mL 1  ? ?Current Facility-Administered Medications  ?Medication Dose Route Frequency Provider Last Rate Last Admin  ? Benralizumab SOSY 30 mg  30 mg Subcutaneous Q8 weeks Alfonse Spruce, MD   30 mg at 02/03/22 3244  ? Study - ORION 4 - inclisiran 300 mg/1.81mL or placebo SQ injection (PI-Stuckey)  300 mg Subcutaneous Q6 months Herby Abraham, MD      ?  ? ?Review of Systems  ?Please see the history of present illness.    ?(+) Positive for shoulder and hip pain  ?(+) Positive for headache when pressures elevated ?All other systems reviewed and are otherwise negative except as noted above. ? ?Physical Exam  ?  ?Wt Readings from Last 3 Encounters:   ?11/25/21 213 lb 12.8 oz (97 kg)  ?07/10/21 204 lb 6 oz (92.7 kg)  ?05/15/21 216 lb 3.2 oz (98.1 kg)  ? ?WN:UUVOZ were no vitals filed for this visit.,There is no height or weight on file to calculate BMI. ? ?

## 2022-03-26 ENCOUNTER — Encounter (HOSPITAL_BASED_OUTPATIENT_CLINIC_OR_DEPARTMENT_OTHER): Payer: Self-pay | Admitting: Nurse Practitioner

## 2022-03-26 ENCOUNTER — Ambulatory Visit (INDEPENDENT_AMBULATORY_CARE_PROVIDER_SITE_OTHER): Payer: Medicare Other | Admitting: Nurse Practitioner

## 2022-03-26 ENCOUNTER — Telehealth: Payer: Self-pay | Admitting: *Deleted

## 2022-03-26 ENCOUNTER — Other Ambulatory Visit (HOSPITAL_BASED_OUTPATIENT_CLINIC_OR_DEPARTMENT_OTHER): Payer: Self-pay | Admitting: Family

## 2022-03-26 VITALS — BP 126/82 | HR 59 | Ht 75.0 in | Wt 210.1 lb

## 2022-03-26 DIAGNOSIS — I2510 Atherosclerotic heart disease of native coronary artery without angina pectoris: Secondary | ICD-10-CM | POA: Diagnosis not present

## 2022-03-26 DIAGNOSIS — J4551 Severe persistent asthma with (acute) exacerbation: Secondary | ICD-10-CM

## 2022-03-26 DIAGNOSIS — I10 Essential (primary) hypertension: Secondary | ICD-10-CM

## 2022-03-26 DIAGNOSIS — E785 Hyperlipidemia, unspecified: Secondary | ICD-10-CM | POA: Diagnosis not present

## 2022-03-26 DIAGNOSIS — R030 Elevated blood-pressure reading, without diagnosis of hypertension: Secondary | ICD-10-CM

## 2022-03-26 MED ORDER — OLMESARTAN MEDOXOMIL 40 MG PO TABS: ORAL_TABLET | ORAL | 3 refills | Status: DC

## 2022-03-26 MED ORDER — HYDRALAZINE HCL 10 MG PO TABS: 10.0000 mg | ORAL_TABLET | ORAL | 2 refills | Status: DC | PRN

## 2022-03-26 NOTE — Telephone Encounter (Signed)
Patient scheduled, Friday, 04/03/22 at 9:30 AM to have 24 Hour Ambulatory Blood Pressure Monitor applied. ?

## 2022-03-26 NOTE — Patient Instructions (Addendum)
Medication Instructions:  ? ?Your physician has recommended you make the following change in your medication:   ? ?START Hydralazine 10mg  as needed for systolic blood pressure more than 160 ? ?*you may take if your blood pressure when checked two times 30 minutes apart is more than 160 for the top number ? ?Make sure you take your blood pressure medications 12 hours apart. ? ?We will reach out to research team about trial of lower dose of Atorvastatin.  ?*If you need a refill on your cardiac medications before your next appointment, please call your pharmacy* ? ? ?Lab Work: ?Your physician recommends that you return for lab work today: TSH, BMET ? ?Testing/Procedures: ?Your physician has requested that you have a renal artery duplex. During this test, an ultrasound is used to evaluate blood flow to the kidneys. Allow one hour for this exam. Do not eat after midnight the day before and avoid carbonated beverages. Take your medications as you usually do. ? ?Your provider has recommended a 24 hour blood pressure monitor. The nursing team that manages will reach out to you to facilitate. ? ?Follow-Up: ?At [facility], you and your health needs are our priority.  As part of our continuing mission to provide you with exceptional heart care, we have created designated Provider Care Teams.  These Care Teams include your primary Cardiologist (physician) and Advanced Practice Providers (APPs -  Physician Assistants and Nurse Practitioners) who all work together to provide you with the care you need, when you need it. ? ?We recommend signing up for the patient portal called "MyChart".  Sign up information is provided on this After Visit Summary.  MyChart is used to connect with patients for Virtual Visits (Telemedicine).  Patients are able to view lab/test results, encounter notes, upcoming appointments, etc.  Non-urgent messages can be sent to your provider as well.   ?To learn more about what you can do with MyChart, go  to [facility].   ? ?Your next appointment:   ?As scheduled with [email or web address] ? ? ?Other Instructions ? ?Tips to Measure your Blood Pressure Correctly ? ?Here's what you can do to ensure a correct reading: ? Don't drink a caffeinated beverage or smoke during the 30 minutes before the test. ? Sit quietly for five minutes before the test begins. ? During the measurement, sit in a chair with your feet on the floor and your arm supported so your elbow is at about heart level. ? The inflatable part of the cuff should completely cover at least 80% of your upper arm, and the cuff should be placed on bare skin, not over a shirt. ? Don't talk during the measurement. ? ? ?Blood pressure categories  ?Blood pressure category SYSTOLIC ?(upper number)  DIASTOLIC ?(lower number)  ?Normal Less than 120 mm Hg and Less than 80 mm Hg  ?Elevated 120-129 mm Hg and Less than 80 mm Hg  ?High blood pressure: Stage 1 hypertension 130-139 mm Hg or 80-89 mm Hg  ?High blood pressure: Stage 2 hypertension 140 mm Hg or higher or 90 mm Hg or higher  ?Hypertensive crisis (consult your doctor immediately) Higher than 180 mm Hg and/or Higher than 120 mm Hg  ?Source: American Heart Association and American Stroke Association. ?For more on getting your blood pressure under control, buy Controlling Your Blood Pressure, a Special Health Report from [facility]. ? ? ?Blood Pressure Log ? ? ?Date ?  ?Time  ?Blood Pressure  ?Example: Nov 1 9 AM 124/78  ? ?    ? ?    ? ?    ? ?    ? ?    ? ?    ? ?    ? ?    ? ? ?  ? ?  Important Information About Sugar ? ? ? ?

## 2022-03-27 ENCOUNTER — Telehealth (HOSPITAL_BASED_OUTPATIENT_CLINIC_OR_DEPARTMENT_OTHER): Payer: Self-pay

## 2022-03-27 LAB — BASIC METABOLIC PANEL
BUN/Creatinine Ratio: 18 (ref 10–24)
BUN: 16 mg/dL (ref 8–27)
CO2: 28 mmol/L (ref 20–29)
Calcium: 9.9 mg/dL (ref 8.6–10.2)
Chloride: 101 mmol/L (ref 96–106)
Creatinine, Ser: 0.9 mg/dL (ref 0.76–1.27)
Glucose: 94 mg/dL (ref 70–99)
Potassium: 4.5 mmol/L (ref 3.5–5.2)
Sodium: 143 mmol/L (ref 134–144)
eGFR: 91 mL/min/{1.73_m2} (ref >59)

## 2022-03-27 LAB — TSH: TSH: 1.08 u[IU]/mL (ref 0.450–4.500)

## 2022-03-27 NOTE — Telephone Encounter (Addendum)
Results called to patient who verbalizes understanding!  ? ? ? ? ?----- Message from Gaston Islam., NP sent at 03/27/2022  7:42 AM EDT ----- ?Good morning, ? ?Please let Louis Perez know that his kidney function and thyroid function are normal.  This is good news because his increased blood pressure has not affected his kidney function.  Please advise him to continue to document blood pressures and to take all medications as prescribed.  Please reach out with any questions. ? ?Thank you, ?Robin Searing, NP ?

## 2022-03-30 DIAGNOSIS — J4550 Severe persistent asthma, uncomplicated: Secondary | ICD-10-CM | POA: Diagnosis not present

## 2022-03-31 ENCOUNTER — Ambulatory Visit (INDEPENDENT_AMBULATORY_CARE_PROVIDER_SITE_OTHER): Payer: Medicare Other

## 2022-03-31 DIAGNOSIS — J4550 Severe persistent asthma, uncomplicated: Secondary | ICD-10-CM | POA: Diagnosis not present

## 2022-04-03 ENCOUNTER — Ambulatory Visit (INDEPENDENT_AMBULATORY_CARE_PROVIDER_SITE_OTHER): Payer: Medicare Other

## 2022-04-03 DIAGNOSIS — I10 Essential (primary) hypertension: Secondary | ICD-10-CM

## 2022-04-03 DIAGNOSIS — R030 Elevated blood-pressure reading, without diagnosis of hypertension: Secondary | ICD-10-CM | POA: Diagnosis not present

## 2022-04-07 ENCOUNTER — Encounter: Payer: Self-pay | Admitting: Internal Medicine

## 2022-04-08 ENCOUNTER — Ambulatory Visit (INDEPENDENT_AMBULATORY_CARE_PROVIDER_SITE_OTHER): Payer: Medicare Other | Admitting: Internal Medicine

## 2022-04-08 ENCOUNTER — Encounter: Payer: Self-pay | Admitting: Internal Medicine

## 2022-04-08 VITALS — BP 116/78 | HR 63 | Temp 97.8°F | Resp 16 | Ht 75.0 in | Wt 210.2 lb

## 2022-04-08 DIAGNOSIS — J4551 Severe persistent asthma with (acute) exacerbation: Secondary | ICD-10-CM

## 2022-04-08 DIAGNOSIS — J3089 Other allergic rhinitis: Secondary | ICD-10-CM | POA: Diagnosis not present

## 2022-04-08 DIAGNOSIS — K219 Gastro-esophageal reflux disease without esophagitis: Secondary | ICD-10-CM | POA: Diagnosis not present

## 2022-04-08 MED ORDER — BUDESONIDE 0.5 MG/2ML IN SUSP: RESPIRATORY_TRACT | 3 refills | Status: DC

## 2022-04-08 MED ORDER — METHYLPREDNISOLONE ACETATE 40 MG/ML IJ SUSP
40.0000 mg | Freq: Once | INTRAMUSCULAR | Status: AC
  Administered 2022-04-08: 40 mg via INTRAMUSCULAR

## 2022-04-08 NOTE — Telephone Encounter (Signed)
Mr. Hobbins- ? ?The blood pressure medicines should not be causing the muscle aches - could be the statin. It was advised that you decrease the dose to 40 mg daily - I would actually stop your statin for at least 2 weeks to see if the symptoms improve. If so, we can restart it at a lower dose or try something else.  ? ?Dr. Rennis Golden ?

## 2022-04-08 NOTE — Progress Notes (Signed)
? ?FOLLOW UP/ACUTE visit ?Date of Service/Encounter:  04/08/22 ? ? ?Subjective:  ?Louis Perez (DOB: [birth year]) is a 71 y.o. male who returns to the [facility] [place] on 04/08/2022 in re-evaluation of the following: Severe persistent asthma on Fasenra, mixed rhinitis, laryngopharyngeal reflux disease, here today with acute flare of asthma ?History obtained from: chart review and patient. ? ?For Review, LV was on 11/25/21  with Dr. Ernst Bowler seen for regular follow-up.  Asthma controlled with stable lung function testing.  As his symptoms are well controlled with Berna Bue, discussed trial of symbicort 1 puff twice daily.  Last Fasenra injection 03/31/2022. ? ?Today he presents for acute asthma exacerbation.  ?He is having wheezing and shortness of breath.  Happening every night for the past several weeks.  He was trying to use his nebulizer as a means to control his current exacerbation, but has been unsuccessful.  Using nebulizer with albuterol at least once a night and at least twice a day for symptom relief.  Using emergency inhaler at least once or twice a day over the last week.  He is still doing his daily 4 miles walk most days of the week despite these symptoms. ?He continues to flare about once per year, usually around this time of year.   ?He uses symbicort 160, 1 puff 1-2 times per day.  He is self limiting his inhalations due to cost.  Throughout the rest of the year, this regimen seems to work for him, but every spring he feels that he ends up on steroids.  He is not sure that he has noted a significant difference in his asthma symptoms since being on Fasenra injections. ? ? ?Allergies as of 04/08/2022   ? ?   Reactions  ? Oxycodone Anaphylaxis, Swelling  ? Oxycodone-acetaminophen Anaphylaxis  ? Tolerates Vicodin  ? Oxycontin [oxycodone Hcl] Anaphylaxis  ? Percocet [oxycodone-acetaminophen] Anaphylaxis  ? Tolerates Vicodin  ? Ambien [zolpidem Tartrate] Other (See Comments)  ? Mood changes    ? Zolpidem Other (See Comments)  ? Mood changes   ? ?  ? ?  ?Medication List  ?  ? ?  ? Accurate as of April 08, 2022  5:13 PM. If you have any questions, ask your nurse or doctor.  ?  ?  ? ?  ? ?albuterol (2.5 MG/3ML) 0.083% nebulizer solution ?Commonly known as: PROVENTIL ?Take 3 mLs (2.5 mg total) by nebulization every 4 (four) hours as needed for wheezing or shortness of breath. ?  ?aspirin 81 MG tablet ?Take 81 mg by mouth at bedtime. ?  ?atorvastatin 80 MG tablet ?Commonly known as: LIPITOR ?TAKE 1 TABLET BY MOUTH ONCE DAILY AT  6  PM ?  ?budesonide-formoterol 160-4.5 MCG/ACT inhaler ?Commonly known as: SYMBICORT ?Inhale 2 puffs into the lungs 2 (two) times daily. Rinse, gargle and spit out after use. ?  ?carvedilol 6.25 MG tablet ?Commonly known as: COREG ?Take 1 tablet (6.25 mg total) by mouth 2 (two) times daily. ?  ?famotidine 20 MG tablet ?Commonly known as: PEPCID ?Take 2 tablets by mouth once daily ?  ?hydrALAZINE 10 MG tablet ?Commonly known as: APRESOLINE ?Take 1 tablet (10 mg total) by mouth as needed (for systolic blood pressure more than 160). ?  ?ipratropium 0.06 % nasal spray ?Commonly known as: ATROVENT ?Place 1 spray into the nose 4 (four) times daily as needed for rhinitis. ?  ?isosorbide mononitrate 60 MG 24 hr tablet ?Commonly known as: IMDUR ?Take 1 tablet (60 mg total) by  mouth daily. Keep upcoming appointment for future refills. ?  ?multivitamin with minerals Tabs tablet ?Take 1 tablet by mouth daily. ?  ?naproxen sodium 220 MG tablet ?Commonly known as: ALEVE ?Take 220 mg by mouth daily as needed. ?  ?nitroGLYCERIN 0.4 MG SL tablet ?Commonly known as: NITROSTAT ?Place 1 tablet (0.4 mg total) under the tongue every 5 (five) minutes as needed for chest pain. ?  ?olmesartan 40 MG tablet ?Commonly known as: Benicar ?Take half tablet (20mg ) twice daily. ?  ?ORION 4 inclisiran or placebo 300 mg/1.5 mL SQ injection ?Inject 1.5 mLs (300 mg total) into the skin every 6 (six) months. ?  ?OVER THE  COUNTER MEDICATION ?ZZZQuil ?  ?pantoprazole 40 MG tablet ?Commonly known as: PROTONIX ?Take 1 tablet (40 mg total) by mouth 2 (two) times daily. ?  ? ?  ? ?Past Medical History:  ?Diagnosis Date  ? Arthritis   ? "hips, lower back, knees, shoulders, hands" (05/14/2016)  ? Asthma dx'd in 2014  ? Bacterial septicemia ([place]) 01/2011  ? "both knee involved"  ? CAD (coronary artery disease)   ? a. NSTEMI 02/2016 s/p DES to LAD. b. Relook cath due to angina/prior high risk anatomy 05/14/16 -> continued medical therapy given concern that a stent strut could inferere with Cx itself.  ? GERD (gastroesophageal reflux disease)   ? Gilbert's disease   ? Hyperlipidemia   ? Hypertension   ? 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 NSTEMI (non-ST elevated myocardial infarction) ([place]) 02/2016  ? Archie Endo 03/02/2016  ? Recurrent upper respiratory infection (URI)   ? Sinus bradycardia   ? Unspecified tinnitus   ? right  ? ?Past Surgical History:  ?Procedure Laterality Date  ? BACK SURGERY    ? CARDIAC CATHETERIZATION N/A 03/01/2016  ? Procedure: Left Heart Cath and Coronary Angiography;  Surgeon: Sherren Mocha, MD;  oLAD 95%, CFX/RI/RCA/RPDA 25-30%  ? CARDIAC CATHETERIZATION N/A 03/01/2016  ? Procedure: Coronary Stent Intervention;  Surgeon: Sherren Mocha, MD;  Promus Premier 4.0 x  12 mm DES to oLAD  ? CARDIAC CATHETERIZATION N/A 05/14/2016  ? Procedure: Left Heart Cath and Coronary Angiography;  Surgeon: Troy Sine, MD;  Location: [facility] CV LAB;  Service: Cardiovascular;  Laterality: N/A;  ? CARDIAC CATHETERIZATION N/A 06/17/2016  ? Procedure: Intravascular Pressure Wire/FFR Study;  Surgeon: Sherren Mocha, MD;  Location: [place] CV LAB;  Service: Cardiovascular;  Laterality: N/A;  ? CARDIOVASCULAR STRESS TEST  05/26/2012  ? EKG negative for ischemia, no ECG changes, no significant ischemia noted  ? I & D KNEE WITH POLY EXCHANGE Bilateral 01/2011  ? & complete synovectomy of 3 compartments of the right total knee/notes 02/04/2011  (replacement,partial knee replacement because of bacterial infection)  ? JOINT REPLACEMENT    ? KNEE ARTHROSCOPY Right ~ 2002  ? LUMBAR DISC SURGERY  ~ 1980; 2000  ? NASAL SINUS SURGERY  08/2011  ? SHOULDER ARTHROSCOPY W/ ROTATOR CUFF REPAIR Right 01/2015  ? TOTAL KNEE ARTHROPLASTY Bilateral 03/26/2004-09/15/2004  ?  right-left  ? TRANSTHORACIC ECHOCARDIOGRAM  05/26/2012  ? EF >55%, mild concentric LVH  ? ?Otherwise, there have been no changes to his past medical history, surgical history, family history, or social history. ? ?ROS: All others negative except as noted per HPI.  ? ?Objective:  ?BP 116/78   Pulse 63   Temp 97.8 ?F (36.6 ?C)   Resp 16   Ht 6\' 3"  (1.905 m)   Wt 210 lb 4 oz (95.4 kg)  SpO2 95%   BMI 26.28 kg/m?  ?Body mass index is 26.28 kg/m?Marland Kitchen ?Physical Exam: ?General Appearance:  Alert, cooperative, no distress, appears stated age  ?Head:  Normocephalic, without obvious abnormality, atraumatic  ?Eyes:  Conjunctiva clear, EOM's intact  ?Nose: Nares normal, normal mucosa and no visible anterior polyps  ?Throat: Lips, tongue normal; teeth and gums normal, normal posterior oropharynx  ?Neck: Supple, symmetrical  ?Lungs:   end-expiratory wheezing, Respirations unlabored, no coughing, moving adequate air  ?Heart:  regular rate and rhythm and no murmur, Appears well perfused  ?Extremities: No edema  ?Skin: Skin color, texture, turgor normal, no rashes or lesions on visualized portions of skin  ?Neurologic: No gross deficits  ? ?Spirometry:  ?Tracings reviewed. His effort: Good reproducible efforts. ?FVC: 2.75L ?FEV1: 1.74L, 47% predicted ?FEV1/FVC ratio: 84% ?Interpretation:  Nonobstructive pattern with possible restriction. This shows around 15% drop in FEV1 since previous visit .  ?Please see scanned spirometry results for details. ? ?Assessment/Plan  ? ?1. Severe persistent asthma, acute exacerbation ? ?For exacerbation- ?  -steroid injection of IM methylprednisolone 40 mg given today in clinic ? -Start  Pulmicort (budesonide) 50 mg/1 vial via nebulizer twice a day for the next 2 weeks or until symptoms resolve.  ? -If asthma flare has not improved in the next 2 to 3 days, start oral steroids provided in clinic-r

## 2022-04-08 NOTE — Patient Instructions (Addendum)
1. Severe persistent asthma, acute exacerbation ? ?For exacerbation- ?  -steroid injection of IM methylprednisolone 40 mg given today in clinic ? -Start Pulmicort (budesonide) 50 mg/1 vial via nebulizer twice a day for the next 2 weeks or until symptoms resolve.  ? -If asthma flare has not improved in the next 2 to 3 days, start oral steroids provided in clinic-read package instructions ? ?Once exacerbation has resolved, return to previous asthma plan. ? ?- Lung testing is worse than last visit, consistent with asthma exacerbation ?- Symbicort ONE PUFF TWICE DAILY (to make it last a bit longer). ?- Daily controller medication(s): Symbicort 160mg  1-2 puffs twice daily with spacer + Fasenra every 8 weeks  ? ?- Prior to physical activity: albuterol 2 puffs 10-15 minutes before physical activity. ?- Rescue medications: albuterol 4 puffs every 4-6 hours as needed ?- Asthma control goals:  ?* Full participation in all desired activities (may need albuterol before activity) ?* Albuterol use two time or less a week on average (not counting use with activity) ?* Cough interfering with sleep two time or less a month ?* Oral steroids no more than once a year ?* No hospitalizations ? ?2. Allergic rhinitis  ?- Continue with nasal ipratropium one spray per nostril up to four tines daily as needed for runny noses. ?- Continue carbinoxamine 4mg  up to twice daily to see if this can help with your runny nose. ?- It can cause some sleepiness.  ? ?3. LPRD (laryngopharyngeal reflux disease) ?- Continue with pantoprazole 40mg  twice daily. ?- Continue with Pepcid (famotidine) once daily.  ? ?4.  Follow-up as scheduled with Dr. ? ? ?Please inform of any Emergency Department visits, hospitalizations, or changes in symptoms. Call before going to the ED for breathing or allergy symptoms since we might be able to fit you in for a sick visit. Feel free to contact Dellis Anes anytime with any questions, problems, or concerns. ? ?It was a  pleasure to meet you today! ? ?Websites that have reliable patient information: ?1. American Academy of Asthma, Allergy, and Immunology: www.aaaai.org ?2. Food Allergy Research and Education (FARE): foodallergy.org ?3. Mothers of Asthmatics: http://www.asthmacommunitynetwork.org ?4. American College of Allergy, Asthma, and Immunology: [place] ? ? ? ? ?

## 2022-04-14 ENCOUNTER — Ambulatory Visit (INDEPENDENT_AMBULATORY_CARE_PROVIDER_SITE_OTHER): Payer: Medicare Other | Admitting: Internal Medicine

## 2022-04-14 ENCOUNTER — Telehealth: Payer: Self-pay

## 2022-04-14 ENCOUNTER — Encounter: Payer: Self-pay | Admitting: Internal Medicine

## 2022-04-14 VITALS — BP 111/70 | HR 65 | Ht 75.0 in | Wt 209.8 lb

## 2022-04-14 DIAGNOSIS — I10 Essential (primary) hypertension: Secondary | ICD-10-CM | POA: Diagnosis not present

## 2022-04-14 DIAGNOSIS — R0989 Other specified symptoms and signs involving the circulatory and respiratory systems: Secondary | ICD-10-CM | POA: Diagnosis not present

## 2022-04-14 DIAGNOSIS — I2510 Atherosclerotic heart disease of native coronary artery without angina pectoris: Secondary | ICD-10-CM

## 2022-04-14 DIAGNOSIS — J4551 Severe persistent asthma with (acute) exacerbation: Secondary | ICD-10-CM

## 2022-04-14 MED ORDER — BUDESONIDE 0.5 MG/2ML IN SUSP: RESPIRATORY_TRACT | 3 refills | Status: DC

## 2022-04-14 NOTE — Telephone Encounter (Signed)
SENT IN rx with correct dx code to [facility] [place] ?

## 2022-04-14 NOTE — Progress Notes (Signed)
?  ?  ?Cardiology Office Note ? ? ?Date:  04/14/2022  ? ?ID:  Louis Perez, DOB July 18, 1951, MRN [id number] ? ?PCP:  [facility]  ?Cardiologist:   Chrystie Nose, MD  ? ?CC: ?Follow-up ? ?History of Present Illness: ?Louis Perez is a 71 y.o. male who presents for evaluation of chest and back pain. Patient has a history of coronary disease with recent NSTEMI in March with DES stenting of the LAD.  He had non obstructive disease elsewhere.  EF was normal.   ? ?He was added to my schedule today for evaluation of chest and arm discomfort. This began on Sunday. It is similar to his previous unstable angina but not as severe. Still he describes the peak of his discomfort at 7 out of 10 in intensity. He gets across his chest. His left arm hurt. At times he has noticed some diaphoresis. It might last for only a minute. He has taken nitroglycerin and over the course of the last few days and it improves the symptoms. He describes it as somewhat heavy. He's not had any palpitations, presyncope or syncope. He's not describing any new shortness of breath, PND or orthopnea. He has brought the discomfort on a few times when he's been doing activities although this is been intermittent. He was actually able to do a little golfing yesterday without symptoms. However, prior to this he had discomfort with golfing building his children sandbox. This morning the pain was at rest. He still describes a little mid chest tightness.  Of note he has not had the symptoms since his stent. ? ?I did review his catheterization. I looked at the images and he had a very high grade proximal LAD in March with nonobstructive disease in the right coronary. ? ?05/28/2016 ? ?Louis Perez is a 71 year old patient who I last saw in 2014. He did not see me in follow-up since that time and is seen a number of other providers in our practice, ultimately leading to presentation with an STEMI in March with a placement of a  drug-eluting stent in the proximal LAD. Subsequent to that he had chest pain as described above and was referred back to the hospital for coronary evaluation. Catheterization indicated an 80% ostial ramus lesion at the area of partially jailed vessel due to the proximal LAD stent. It was felt that intervention could compromise the LAD therefore medical therapy was recommended. His isosorbide was doubled to 60 mg daily. Since then he reports he has had some improvement in chest discomfort but has significant headache. ? ?06/15/2016 ? ?Louis Perez was seen back today in the office for chest pain. When I last saw him we had increased his isosorbide up to 60 mg daily and he's been on Ranexa thousand milligrams twice a day. He reports he continues to have episodes of chest discomfort which can occur at rest or with exertion. I've advised that he stop cardiac rehabilitation until we can determine whether or not his pain is related to coronary obstruction. ? ?07/23/2016 ? ?Louis Perez returns for follow-up today. He underwent repeat cardiac catheterization by Dr. Excell Seltzer. This showed the following: ? ?Conclusion  ? ?Prox RCA to Mid RCA lesion, 25% stenosed. ?Ost LM lesion, 25% stenosed. ?Ost Ramus lesion, 75% stenosed. ?Mid LAD to Dist LAD lesion, 50% stenosed. ?Ost 2nd Diag to 2nd Diag lesion, 30% stenosed.  ?1. Continued patency of the proximal LAD stent ?2. Mild diffuse nonobstructive RCA stenosis with diffuse coronary  ectasia ?3. Patent left circumflex ?4. Moderate-severe stenosis of the ramus intermedius with negative pressure wire analysis (FFR = 0.92 baseline and 0.85 at peak hyperemia) ?  ?Recommend: resume cardiac rehab, medical therapy  ? ?No intervention was performed. Since discharge she notes that he's had no further chest pain. He feels like the change in his isosorbide is been helpful. He is about to finish card agreeable dictation and will continue to exercise at the [facility]. He's lost about 5-7 pounds and is well  within the normal weight range at this time. ? ?01/04/2017 ? ?Louis Perez returns today for follow-up. He reports that initially had some improvement with increasing his medical therapy for angina but still continues to have sharp, intermittent chest pains on a daily basis. This is typically in the right sternal area. He would not clearly say that his symptoms are progressively getting worse. Blood pressure is well-controlled today. His LDL cholesterol is 93 on 80 mg Lipitor. I advised him that his goal cholesterol should be much lower and that we may have difficulty reaching that since he is on a high potency statin. Options include adding ezetimibe or perhaps enrolling him in the Orion-10 trial. He did seem to be interested in the study medication. His last lipid profile was earlier this year and he will be due for repeat blood work. He is also had problems with asthma and has seen Dr. Sherene Sires for evaluation of this and to include cyclical shortness of breath. ? ?03/22/2017 ? ?Louis Perez returns today for follow-up. He is doing fairly well denies any recurrent chest pain. Unfortunately has an upper respiratory infection and is had a flare of his asthma recently. Cholesterol is much better control with LDL now down to 64 on recent lab work with an LDL particle number less than 1000. ApoB and LPA level both at goal as well. It's now been over one year since his drug-eluting stent. He underwent a relook catheterization which showed a patent stent a couple months after his initial procedure for ongoing chest pain. He is also on Ranexa and isosorbide. He is interested in coming off some of his medicines. ? ?08/02/2017 ? ?Louis Perez returns today for follow-up. He reports he occasionally gets some chest discomfort but its quick, sharp and atypical for coronary disease. He's found nothing to be limiting of his activities. He weaned himself off Ranexa by his request. He remains on isosorbide 60 mg twice a day. He has not  required short acting nitroglycerin. Overall he feels fairly well. His energy level is good. He is on atorvastatin 80 mg with an LDL C a less than 70. Blood pressure is at goal. ? ?02/15/2018 ? ?Louis Perez returns today for follow-up.  He continues to get occasional sharp chest discomfort.  This is a similar complaint he had in August of last year.  He took himself off Ranexa and noticed no difference in his symptoms.  He is on isosorbide.  He said none of the episodes last long enough to take short acting nitro.  His wife is concerned only that he seems to mention it more frequently.  The episodes are not associated with exertion or relieved by rest.  EKG personally reviewed today shows normal sinus rhythm without ischemic changes at 64. ? ?09/26/2018 ? ?Louis Perez is seen today in follow-up.  Over the past year he is done very well.  He is now walking up to about 20 miles a week.  He denies any sharp chest pains  or worsening shortness of breath.  He is off of his antianginal medications.  He had lipid testing this summer and LDL was 81.  He reports a pretty healthy diet however does have some intake of saturated fats that could be improved. ? ?11/14/2019 ? ?Louis Perez is seen today in follow-up.  This is a routine visit however he has been having some symptoms.  Recently has had some worsening dyspnea on exertion.  He did see his pulmonologist and had medications adjusted for his asthma and was treated for possible upper respiratory infection.  He is also been struggling with sores in his mouth and was placed on a steroid rinse for that.  He is noted labile blood pressures recently.  His symptoms are worse with regards to dyspnea walking up hills but he denies any chest pain.  His last coronary evaluation was in 2017 at which time he had FFR of the ramus intermedius which was 0.85 noting a 75% ostial lesion.  This was not intervened upon.  He also had 50% mid to distal LAD stenosis and some mild disease of the  other coronaries.  He has been on medical therapy since then.  I am concerned that he is possibly had some progressive coronary disease. ? ?11/28/2019 ? ?Louis Perez returns today for follow-up.  He underwe

## 2022-04-14 NOTE — Telephone Encounter (Addendum)
Called patient - DOB verified - Patient advised Pharmacist at [facility] - informed him that his insurance no longer covers Symbicort.  ? ?The Rx for budesonide (Pulmicort) for his nebulizer has been corrected and sent to pharmacy. ? ?Please advise substitute for Symbicort for patient. ? ? ?04/16/21 ? ?Called patient - DOB verified - Patient advised $30 copay for his Symbicort was not too expensive - he's already picked it up.  ? ?Called [facility] - advised Pulmicort is on backorder. Called [facility], pharmacist - advised they have low stock - advised I would be sending Rx immediately. Called patient back advised of above notation - he verbalized understanding, no further questions. ? ? ? ? ? ?

## 2022-04-14 NOTE — Telephone Encounter (Signed)
-----   Message from Sigurd Sos, MD sent at 04/14/2022 12:02 PM EDT ----- ?Please contact patient.  He was apparently unable to fill his inhalers but has not let us know.  His cardiologist reached out as he has continued to have issues with chest pain and breathing. Thanks. ? ?----- Message ----- ?From: Pixie Casino, MD ?Sent: 04/14/2022  11:26 AM EDT ?To: Hillary Bow, MD, Sigurd Sos, MD, # ? ?Tom and Kalman Jewels, Mr. Dabdoub is in the [place] - I'm currently holding his atorvastatin for possible myalgias. -Mali ? ?

## 2022-04-14 NOTE — Patient Instructions (Signed)
Medication Instructions:  ?HOLD statin medication for a few more weeks and follow up with update on symptoms via phone or MyChart ? ?*If you need a refill on your cardiac medications before your next appointment, please call your pharmacy* ? ? ? ?Follow-Up: ?At [facility], you and your health needs are our priority.  As part of our continuing mission to provide you with exceptional heart care, we have created designated Provider Care Teams.  These Care Teams include your primary Cardiologist (physician) and Advanced Practice Providers (APPs -  Physician Assistants and Nurse Practitioners) who all work together to provide you with the care you need, when you need it. ? ?We recommend signing up for the patient portal called "MyChart".  Sign up information is provided on this After Visit Summary.  MyChart is used to connect with patients for Virtual Visits (Telemedicine).  Patients are able to view lab/test results, encounter notes, upcoming appointments, etc.  Non-urgent messages can be sent to your provider as well.   ?To learn more about what you can do with MyChart, go to [email or web address].   ? ?Your next appointment:   ?12 month(s) ? ?The format for your next appointment:   ?In Person ? ?Provider:   ?Chrystie Nose, MD { ?

## 2022-04-14 NOTE — Telephone Encounter (Signed)
Patient called stating the Pulmicort for the Nebulizer doesn't have the correct diagnosis code for the insurance to process it. Please update and let the pharmacy know. ?[facility] [place]  ? ?Thanks ? ? ?

## 2022-04-16 MED ORDER — BUDESONIDE 0.5 MG/2ML IN SUSP: RESPIRATORY_TRACT | 3 refills | Status: DC

## 2022-04-16 NOTE — Addendum Note (Signed)
Addended by: Areta Haber B on: 04/16/2022 09:09 AM ? ? Modules accepted: Orders ? ?

## 2022-04-19 ENCOUNTER — Other Ambulatory Visit: Payer: Self-pay | Admitting: Allergy & Immunology

## 2022-05-08 ENCOUNTER — Ambulatory Visit: Payer: Medicare Other | Admitting: Internal Medicine

## 2022-05-15 DIAGNOSIS — Z006 Encounter for examination for normal comparison and control in clinical research program: Secondary | ICD-10-CM

## 2022-05-15 NOTE — Research (Signed)
Confirmed with patient his appointment on June 5 th at 8:30 am

## 2022-05-17 ENCOUNTER — Other Ambulatory Visit: Payer: Self-pay | Admitting: Allergy & Immunology

## 2022-05-18 DIAGNOSIS — J4550 Severe persistent asthma, uncomplicated: Secondary | ICD-10-CM | POA: Diagnosis not present

## 2022-05-18 DIAGNOSIS — Z006 Encounter for examination for normal comparison and control in clinical research program: Secondary | ICD-10-CM

## 2022-05-18 NOTE — Research (Signed)
Patient was seen in the research clinic for Month 15 visit of the Orion 4 trial. Medications reviewed and updated. Patient stopped his lipitor on 04/14/2022 due to muscle aches. Patient states that the muscle aches have resolved since he stopped taking it. Denies any other adverse events or hospitalizations since last visit. Labs were drawn @ 0825. IP given @ 0840 in the right upper quadrant of the abdomen. Patient tolerated both well without complaints. Next appointment made for 11/16/2022 @ 0830   Current Outpatient Medications:    Acetaminophen (ACETAMINOPHEN EXTRA STRENGTH) 500 MG capsule, 2 capsules as needed, Disp: , Rfl:    albuterol (PROVENTIL) (2.5 MG/3ML) 0.083% nebulizer solution, Take 3 mLs (2.5 mg total) by nebulization every 4 (four) hours as needed for wheezing or shortness of breath., Disp: 75 mL, Rfl: 1   aspirin 81 MG tablet, Take 81 mg by mouth at bedtime. , Disp: , Rfl:    atorvastatin (LIPITOR) 80 MG tablet, TAKE 1 TABLET BY MOUTH ONCE DAILY AT  6  PM (Patient not taking: Reported on 04/14/2022), Disp: 90 tablet, Rfl: 3   budesonide (PULMICORT) 0.5 MG/2ML nebulizer solution, For asthma flares-take 2 mLs via nebulizer at morning and bedtime for 2 weeks or until asthma symptoms resolve, Disp: 60 mL, Rfl: 3   budesonide-formoterol (SYMBICORT) 160-4.5 MCG/ACT inhaler, Inhale 2 puffs into the lungs 2 (two) times daily. Rinse, gargle and spit out after use., Disp: 10.2 g, Rfl: 1   carvedilol (COREG) 6.25 MG tablet, Take 1 tablet (6.25 mg total) by mouth 2 (two) times daily., Disp: 180 tablet, Rfl: 3   famotidine (PEPCID) 20 MG tablet, Take 2 tablets by mouth once daily, Disp: 180 tablet, Rfl: 0   hydrALAZINE (APRESOLINE) 10 MG tablet, Take 1 tablet (10 mg total) by mouth as needed (for systolic blood pressure more than 160). (Patient not taking: Reported on 04/14/2022), Disp: 60 tablet, Rfl: 2   ipratropium (ATROVENT) 0.06 % nasal spray, Place 1 spray into the nose 4 (four) times daily as needed  for rhinitis. (Patient not taking: Reported on 04/14/2022), Disp: 15 mL, Rfl: 5   isosorbide mononitrate (IMDUR) 60 MG 24 hr tablet, Take 1 tablet (60 mg total) by mouth daily. Keep upcoming appointment for future refills., Disp: 30 tablet, Rfl: 11   Multiple Vitamin (MULTIVITAMIN WITH MINERALS) TABS tablet, Take 1 tablet by mouth daily., Disp: , Rfl:    naproxen sodium (ALEVE) 220 MG tablet, Take 220 mg by mouth daily as needed., Disp: , Rfl:    nitroGLYCERIN (NITROSTAT) 0.4 MG SL tablet, Place 1 tablet (0.4 mg total) under the tongue every 5 (five) minutes as needed for chest pain., Disp: 25 tablet, Rfl: 3   olmesartan (BENICAR) 40 MG tablet, Take half tablet (20mg ) twice daily., Disp: 90 tablet, Rfl: 3   OVER THE COUNTER MEDICATION, ZZZQuil, Disp: , Rfl:    pantoprazole (PROTONIX) 40 MG tablet, Take 1 tablet (40 mg total) by mouth 2 (two) times daily., Disp: 60 tablet, Rfl: 9   Study - ORION 4 - inclisiran 300 mg/1.54mL or placebo SQ injection (PI-Stuckey), Inject 1.5 mLs (300 mg total) into the skin every 6 (six) months., Disp: 1 mL, Rfl: 1  Current Facility-Administered Medications:    Benralizumab SOSY 30 mg, 30 mg, Subcutaneous, Q8 weeks, 4m, MD, 30 mg at 03/31/22 0858   Study - ORION 4 - inclisiran 300 mg/1.59mL or placebo SQ injection (PI-Stuckey), 300 mg, Subcutaneous, Q6 months, 4m, MD

## 2022-05-19 ENCOUNTER — Ambulatory Visit
Admission: RE | Admit: 2022-05-19 | Discharge: 2022-05-19 | Disposition: A | Discharge status: Home / Self Care | Payer: Medicare Other | Source: Ambulatory Visit | Attending: Allergy and Immunology | Admitting: Allergy and Immunology

## 2022-05-19 ENCOUNTER — Ambulatory Visit (INDEPENDENT_AMBULATORY_CARE_PROVIDER_SITE_OTHER): Payer: Medicare Other | Admitting: Allergy and Immunology

## 2022-05-19 VITALS — BP 140/100 | HR 62 | Temp 96.6°F | Resp 16 | Ht 75.0 in | Wt 208.2 lb

## 2022-05-19 DIAGNOSIS — J4550 Severe persistent asthma, uncomplicated: Secondary | ICD-10-CM | POA: Diagnosis not present

## 2022-05-19 DIAGNOSIS — I10 Essential (primary) hypertension: Secondary | ICD-10-CM | POA: Diagnosis not present

## 2022-05-19 DIAGNOSIS — J3089 Other allergic rhinitis: Secondary | ICD-10-CM | POA: Diagnosis not present

## 2022-05-19 DIAGNOSIS — I2510 Atherosclerotic heart disease of native coronary artery without angina pectoris: Secondary | ICD-10-CM

## 2022-05-19 DIAGNOSIS — K219 Gastro-esophageal reflux disease without esophagitis: Secondary | ICD-10-CM

## 2022-05-19 MED ORDER — ALBUTEROL SULFATE HFA 108 (90 BASE) MCG/ACT IN AERS: 2.0000 | INHALATION_SPRAY | RESPIRATORY_TRACT | 1 refills | Status: DC | PRN

## 2022-05-19 MED ORDER — ALBUTEROL SULFATE (2.5 MG/3ML) 0.083% IN NEBU: 2.5000 mg | INHALATION_SOLUTION | RESPIRATORY_TRACT | 1 refills | Status: DC | PRN

## 2022-05-19 MED ORDER — BREZTRI AEROSPHERE 160-9-4.8 MCG/ACT IN AERO: 2.0000 | INHALATION_SPRAY | Freq: Two times a day (BID) | RESPIRATORY_TRACT | 5 refills | Status: DC

## 2022-05-19 MED ORDER — FAMOTIDINE 20 MG PO TABS: 20.0000 mg | ORAL_TABLET | Freq: Every evening | ORAL | 5 refills | Status: DC

## 2022-05-19 MED ORDER — LEVOCETIRIZINE DIHYDROCHLORIDE 5 MG PO TABS: 5.0000 mg | ORAL_TABLET | Freq: Two times a day (BID) | ORAL | 5 refills | Status: DC | PRN

## 2022-05-19 MED ORDER — IPRATROPIUM BROMIDE 0.06 % NA SOLN: 1.0000 | Freq: Four times a day (QID) | NASAL | 5 refills | Status: DC | PRN

## 2022-05-19 MED ORDER — PANTOPRAZOLE SODIUM 40 MG PO TBEC: 40.0000 mg | DELAYED_RELEASE_TABLET | Freq: Two times a day (BID) | ORAL | 5 refills | Status: DC

## 2022-05-19 MED ORDER — TEZEPELUMAB-EKKO 210 MG/1.91ML ~~LOC~~ SOSY
210.0000 mg | PREFILLED_SYRINGE | SUBCUTANEOUS | Status: AC
  Administered 2022-05-19 – 2025-01-04 (×33): 210 mg via SUBCUTANEOUS

## 2022-05-19 MED ORDER — NYSTATIN 100000 UNIT/ML MT SUSP: 5.0000 mL | Freq: Four times a day (QID) | OROMUCOSAL | 2 refills | Status: DC

## 2022-05-19 MED ORDER — PREDNISONE 10 MG PO TABS: ORAL_TABLET | ORAL | 0 refills | Status: DC

## 2022-05-19 NOTE — Progress Notes (Unsigned)
[place]   Follow-up Note  Referring Provider: Roe [place], [place]* Primary Provider: [place]er[place], [facility] Date of Office Visit: 05/19/2022  Subjective:   Louis Perez (DOB: 1951/01/25) is a 71 y.o. male who [facility] in re-evaluation of the following:  HPI: Ed returns to this clinic in evaluation of severe asthma treated with mepolizumab, allergic rhinitis, LPR.  I have not seen him in his clinic since 15 March 2018.  He did visit with Dr. Maurine Ministerennis on 08 April 2022 in the treatment of an asthma exacerbation.  Although he has improved somewhat since the therapy administered by Dr. Maurine Ministerennis during his last visit he still continues to have lots of wheezing and coughing and shortness of breath and he cannot exercise as well and has been using his bronchodilator on a daily basis.  He does not have any other associated respiratory tract symptoms.  He might have a little bit of stuffiness in his nose but he can still smell and taste with no problem.  His nose still runs whenever he goes out for a walk especially if it is cold.  He has not been having any problems with his reflux.  He has been having some problems with thrush ever since he received his Depo-Medrol injection during his last visit.  It should be noted that he has had manipulation of his medications for hypertension.  If I understand the story correctly it sounds as though he has had his Carvedilol quadrupled in dose over the course of the past several months.  Allergies as of 05/19/2022       Reactions   Oxycodone Anaphylaxis, Swelling   Oxycodone-acetaminophen Anaphylaxis   Tolerates Vicodin   Oxycontin [oxycodone Hcl] Anaphylaxis   Percocet [oxycodone-acetaminophen] Anaphylaxis   Tolerates Vicodin   Ambien [zolpidem Tartrate] Other (See Comments)   Mood changes    Zolpidem Other (See Comments)   Mood  changes         Medication List    Acetaminophen Extra Strength 500 MG capsule Generic drug: Acetaminophen 2 capsules as needed   albuterol (2.5 MG/3ML) 0.083% nebulizer solution Commonly known as: PROVENTIL Take 3 mLs (2.5 mg total) by nebulization every 4 (four) hours as needed for wheezing or shortness of breath.   aspirin 81 MG tablet Take 81 mg by mouth at bedtime.   budesonide 0.5 MG/2ML nebulizer solution Commonly known as: Pulmicort For asthma flares-take 2 mLs via nebulizer at morning and bedtime for 2 weeks or until asthma symptoms resolve   budesonide-formoterol 160-4.5 MCG/ACT inhaler Commonly known as: SYMBICORT Inhale 2 puffs into the lungs 2 (two) times daily. Rinse, gargle and spit out after use.   carvedilol 6.25 MG tablet Commonly known as: COREG Take 1 tablet (6.25 mg total) by mouth 2 (two) times daily.   famotidine 20 MG tablet Commonly known as: PEPCID Take 2 tablets by mouth once daily   hydrALAZINE 10 MG tablet Commonly known as: APRESOLINE Take 1 tablet (10 mg total) by mouth as needed (for systolic blood pressure more than 160).   ipratropium 0.06 % nasal spray Commonly known as: ATROVENT Place 1 spray into the nose 4 (four) times daily as needed for rhinitis.   isosorbide mononitrate 60 MG 24 hr tablet Commonly known as: IMDUR Take 1 tablet (60 mg total) by mouth daily. Keep upcoming appointment for future refills.   multivitamin with minerals Tabs tablet Take 1  tablet by mouth daily.   NAC 600 PO Take 1 mg by mouth 2 (two) times daily.   naproxen sodium 220 MG tablet Commonly known as: ALEVE Take 220 mg by mouth daily as needed.   nitroGLYCERIN 0.4 MG SL tablet Commonly known as: NITROSTAT Place 1 tablet (0.4 mg total) under the tongue every 5 (five) minutes as needed for chest pain.   olmesartan 40 MG tablet Commonly known as: Benicar Take half tablet (20mg ) twice daily.   ORION 4 inclisiran or placebo 300 mg/1.5 mL SQ  injection Inject 1.5 mLs (300 mg total) into the skin every 6 (six) months.   OVER THE COUNTER MEDICATION ZZZQuil   pantoprazole 40 MG tablet Commonly known as: PROTONIX Take 1 tablet (40 mg total) by mouth 2 (two) times daily.   Quercetin 500 MG Caps Take 1 mg by mouth 2 (two) times daily.        Past Medical History:  Diagnosis Date   Arthritis    "hips, lower back, knees, shoulders, hands" (05/14/2016)   Asthma dx'd in 2014   Bacterial septicemia (HCC) 01/2011   "both knee involved"   CAD (coronary artery disease)    a. NSTEMI 02/2016 s/p DES to LAD. b. Relook cath due to angina/prior high risk anatomy 05/14/16 -> continued medical therapy given concern that a stent strut could inferere with Cx itself.   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    NSTEMI (non-ST elevated myocardial infarction) (HCC) 02/2016   03/2016 03/02/2016   Recurrent upper respiratory infection (URI)    Sinus bradycardia    Unspecified tinnitus    right    Past Surgical History:  Procedure Laterality Date   BACK SURGERY     CARDIAC CATHETERIZATION N/A 03/01/2016   Procedure: Left Heart Cath and Coronary Angiography;  Surgeon: 03/03/2016, MD;  oLAD 95%, CFX/RI/RCA/RPDA 25-30%   CARDIAC CATHETERIZATION N/A 03/01/2016   Procedure: Coronary Stent Intervention;  Surgeon: 03/03/2016, MD;  Promus Premier 4.0 x  12 mm DES to oLAD   CARDIAC CATHETERIZATION N/A 05/14/2016   Procedure: Left Heart Cath and Coronary Angiography;  Surgeon: 07/14/2016, MD;  Location: [facility] INVASIVE CV LAB;  Service: Cardiovascular;  Laterality: N/A;   CARDIAC CATHETERIZATION N/A 06/17/2016   Procedure: Intravascular Pressure Wire/FFR Study;  Surgeon: 08/18/2016, MD;  Location: [facility] INVASIVE CV LAB;  Service: Cardiovascular;  Laterality: N/A;   CARDIOVASCULAR STRESS TEST  05/26/2012   EKG negative for ischemia, no ECG changes, no  significant ischemia noted   I & D KNEE WITH POLY EXCHANGE Bilateral 01/2011   & complete synovectomy of 3 compartments of the right total knee/notes 02/04/2011 (replacement,partial knee replacement because of bacterial infection)   JOINT REPLACEMENT     KNEE ARTHROSCOPY Right ~ 2002   LUMBAR DISC SURGERY  ~ 1980; 2000   NASAL SINUS SURGERY  08/2011   SHOULDER ARTHROSCOPY W/ ROTATOR CUFF REPAIR Right 01/2015   TOTAL KNEE ARTHROPLASTY Bilateral 03/26/2004-09/15/2004    right-left   TRANSTHORACIC ECHOCARDIOGRAM  05/26/2012   EF >55%, mild concentric LVH    Review of systems negative except as noted in HPI / PMHx or noted below:  Review of Systems  Constitutional: Negative.   HENT: Negative.    Eyes: Negative.   Respiratory: Negative.    Cardiovascular: Negative.   Gastrointestinal: Negative.   Genitourinary: Negative.   Musculoskeletal:  Negative.   Skin: Negative.   Neurological: Negative.   Endo/Heme/Allergies: Negative.   Psychiatric/Behavioral: Negative.      Objective:   Vitals:   05/19/22 1339  BP: (!) 140/100  Pulse: 62  Resp: 16  Temp: (!) 96.6 F (35.9 C)  SpO2: 94%   Height:  (190.5 cm)  Weight: 208 lb 3.2 oz (94.4 kg)   Physical Exam Constitutional:      Appearance: He is not diaphoretic.  HENT:     Head: Normocephalic.     Right Ear: Tympanic membrane, ear canal and external ear normal.     Left Ear: Tympanic membrane, ear canal and external ear normal.     Nose: Nose normal. No mucosal edema or rhinorrhea.     Mouth/Throat:     Pharynx: Uvula midline. No oropharyngeal exudate.  Eyes:     Conjunctiva/sclera: Conjunctivae normal.  Neck:     Thyroid: No thyromegaly.     Trachea: Trachea normal. No tracheal tenderness or tracheal deviation.  Cardiovascular:     Rate and Rhythm: Normal rate and regular rhythm.     Heart sounds: Normal heart sounds, S1 normal and S2 normal. No murmur heard. Pulmonary:     Effort: No respiratory distress.     Breath  sounds: No stridor. Wheezing (Bilateral expiratory wheezes all lung fields) present. No rales.  Lymphadenopathy:     Head:     Right side of head: No tonsillar adenopathy.     Left side of head: No tonsillar adenopathy.     Cervical: No cervical adenopathy.  Skin:    Findings: No erythema or rash.     Nails: There is no clubbing.  Neurological:     Mental Status: He is alert.    Diagnostics:    Spirometry was performed and demonstrated an FEV1 of 1.53 at 41 % of predicted.  Assessment and Plan:   1. Not well controlled severe persistent asthma   2. Other allergic rhinitis   3. LPRD (laryngopharyngeal reflux disease)   4. Primary hypertension     1. START Breztri - 2 inhalations 2 times per day w/ spacer (empty lungs). No Symbicort  2. CHANGE benralizumab to tezepelumab injections. First dose today and every 4 weeks  3. Prednisone 10 mg - 2 tabs daily for 1 week, then 1-1/2 tablet daily for 7 days, then 1 tab daily for 7 days. 2 tablets administered today  4. Can use nystatin oral solution-5 mL swish and gargle and swallow after Breztri use.  5. Continue pantoprazole 40 mg - 2 times per day + famotidine 40 mg - in evening  6. Continue the following if needed:  A. Albuterol HFA - 2 inhalations every 4-6 hours B. Ipratropium 0.06% - 1-2 sprays each nostril every 6 hours to dry nose C. OTC antihistamine - claritin / allegra / zyrtec  7. Carvedilol side effect ???  8. Obtain a chest X-Ray  9. Return to clinic in 4 weeks or earlier if problem  Ed has not done well with his airway over the course of the past several months and it appears as though his current plan of using Symbicort and benralizumab to control his asthma is inadequate.  We will now give him a triple inhaler and change him to anti-TSLP antibody why we give him a systemic steroid dose over the course of the next several weeks.  It should be noted that there is a temporal relationship between him increasing his  dose of carvedilol  and him developing these respiratory tract symptoms.  It may be that he is suffering from a side effect with the use of this beta-blocker.  I am going to regroup with him in 4 weeks to assess his response to the plan noted above.  To be complete we will check a chest x-ray as he has certainly had a change in his respiratory status over the course of the past few months.  Laurette Schimke, MD Allergy / Immunology [facility]

## 2022-05-19 NOTE — Patient Instructions (Addendum)
  1. START Breztri - 2 inhalations 2 times per day w/ spacer (empty lungs). No Symbicort  2. CHANGE benralizumab to tezepelumab injections. First dose today and every 4 weeks  3. Prednisone 10 mg - 2 tabs daily for 1 week, then 1-1/2 tablet daily for 7 days, then 1 tab daily for 7 days. 2 tablets administered today  4. Can use nystatin oral solution-5 mL swish and gargle and swallow after Breztri use.  5. Continue pantoprazole 40 mg - 2 times per day + famotidine 40 mg - in evening  6. Continue the following if needed:  A. Albuterol HFA - 2 inhalations every 4-6 hours B. Ipratropium 0.06% - 1-2 sprays each nostril every 6 hours to dry nose C. OTC antihistamine - claritin / allegra / zyrtec  7. Carvedilol side effect ???  8. Obtain a chest X-Ray  9. Return to clinic in 4 weeks or earlier if problem

## 2022-05-19 NOTE — Progress Notes (Signed)
Immunotherapy   Patient Details  Name: Louis Perez MRN: [id number] Date of Birth: 09-May-1951  05/19/2022  [name] started injections for  [place] Frequency: Every four weeks Epi-Pen: Not needed.  Consent signed and patient instructions given. Patient waited in the lobby og the injection room for thirty minutes without an issue.    Ralene Muskrat 05/19/2022, 7:33 PM

## 2022-05-20 ENCOUNTER — Other Ambulatory Visit: Payer: Self-pay | Admitting: Allergy & Immunology

## 2022-05-20 ENCOUNTER — Encounter: Payer: Self-pay | Admitting: Allergy and Immunology

## 2022-05-26 ENCOUNTER — Ambulatory Visit: Payer: Medicare Other | Admitting: Allergy & Immunology

## 2022-05-26 ENCOUNTER — Ambulatory Visit: Payer: Medicare Other

## 2022-06-17 DIAGNOSIS — J4550 Severe persistent asthma, uncomplicated: Secondary | ICD-10-CM | POA: Diagnosis not present

## 2022-06-18 ENCOUNTER — Ambulatory Visit (INDEPENDENT_AMBULATORY_CARE_PROVIDER_SITE_OTHER): Payer: Medicare Other

## 2022-06-18 DIAGNOSIS — J4550 Severe persistent asthma, uncomplicated: Secondary | ICD-10-CM

## 2022-06-29 DIAGNOSIS — R7989 Other specified abnormal findings of blood chemistry: Secondary | ICD-10-CM | POA: Insufficient documentation

## 2022-06-29 DIAGNOSIS — K5900 Constipation, unspecified: Secondary | ICD-10-CM | POA: Insufficient documentation

## 2022-06-30 ENCOUNTER — Encounter: Payer: Self-pay | Admitting: Allergy and Immunology

## 2022-06-30 ENCOUNTER — Ambulatory Visit (INDEPENDENT_AMBULATORY_CARE_PROVIDER_SITE_OTHER): Payer: Medicare Other | Admitting: Allergy and Immunology

## 2022-06-30 ENCOUNTER — Other Ambulatory Visit: Payer: Self-pay

## 2022-06-30 VITALS — BP 130/84 | HR 56 | Temp 97.6°F | Resp 18 | Ht 75.0 in | Wt 209.8 lb

## 2022-06-30 DIAGNOSIS — I2510 Atherosclerotic heart disease of native coronary artery without angina pectoris: Secondary | ICD-10-CM | POA: Diagnosis not present

## 2022-06-30 DIAGNOSIS — J3089 Other allergic rhinitis: Secondary | ICD-10-CM | POA: Diagnosis not present

## 2022-06-30 DIAGNOSIS — J4550 Severe persistent asthma, uncomplicated: Secondary | ICD-10-CM

## 2022-06-30 DIAGNOSIS — I10 Essential (primary) hypertension: Secondary | ICD-10-CM | POA: Diagnosis not present

## 2022-06-30 DIAGNOSIS — K219 Gastro-esophageal reflux disease without esophagitis: Secondary | ICD-10-CM

## 2022-06-30 MED ORDER — NYSTATIN 100000 UNIT/ML MT SUSP: 5.0000 mL | Freq: Four times a day (QID) | OROMUCOSAL | 5 refills | Status: DC

## 2022-06-30 MED ORDER — PANTOPRAZOLE SODIUM 40 MG PO TBEC: 40.0000 mg | DELAYED_RELEASE_TABLET | Freq: Two times a day (BID) | ORAL | 5 refills | Status: DC

## 2022-06-30 MED ORDER — BREZTRI AEROSPHERE 160-9-4.8 MCG/ACT IN AERO
2.0000 | INHALATION_SPRAY | Freq: Two times a day (BID) | RESPIRATORY_TRACT | 5 refills | Status: DC
Start: 2022-06-30 — End: 2022-09-22

## 2022-06-30 MED ORDER — LEVOCETIRIZINE DIHYDROCHLORIDE 5 MG PO TABS: 5.0000 mg | ORAL_TABLET | Freq: Two times a day (BID) | ORAL | 5 refills | Status: DC | PRN

## 2022-06-30 NOTE — Progress Notes (Unsigned)
[place] [facility] [place]   Follow-up Note  Referring Provider: Josefa Half* Primary Provider: [facility] Date of Office Visit: 06/30/2022  Subjective:   Louis Perez (DOB: 1951/11/24) is a 71 y.o. male who returns to the [facility] on 06/30/2022 in re-evaluation of the following:  HPI: [name] returns to this clinic in reevaluation of asthma, allergic rhinitis, LPR.  His last visit to this clinic was 19 May 2022.  During his last visit he just was not doing very well regarding his respiratory tract issue and we started him on tezepelumab while he continued on a collection of anti-inflammatory agents for his airway and therapy directed against reflux.  He is much better at this point in time and has no respiratory tract symptoms and does not use a short acting bronchodilator and overall just feels wonderful.  He has not been having any problems with his nose.  He has not been having any problems with reflux.  He has not been having any problems with thrush.  He informs me that he has been having problems with his hypertension and he still has readings up to 180 in the evening.  The reason that he checks his blood pressure in the evening is he will sometimes have a headache and that does correlate with a high blood pressure.  Allergies as of 06/30/2022       Reactions   Oxycodone Anaphylaxis, Swelling   Oxycodone-acetaminophen Anaphylaxis   Tolerates Vicodin   Oxycontin [oxycodone Hcl] Anaphylaxis   Percocet [oxycodone-acetaminophen] Anaphylaxis   Tolerates Vicodin   Ambien [zolpidem Tartrate] Other (See Comments)   Mood changes    Zolpidem Other (See Comments)   Mood changes         Medication List    Acetaminophen Extra Strength 500 MG capsule Generic drug: Acetaminophen 2 capsules as needed   albuterol (2.5 MG/3ML) 0.083% nebulizer solution Commonly known as:  PROVENTIL Take 3 mLs (2.5 mg total) by nebulization every 4 (four) hours as needed for wheezing or shortness of breath.   albuterol 108 (90 Base) MCG/ACT inhaler Commonly known as: Ventolin HFA Inhale 2 puffs into the lungs every 4 (four) hours as needed for wheezing or shortness of breath.   aspirin 81 MG tablet Take 81 mg by mouth at bedtime.   Breztri Aerosphere 160-9-4.8 MCG/ACT Aero Generic drug: Budeson-Glycopyrrol-Formoterol Inhale 2 puffs into the lungs in the morning and at bedtime.   budesonide 0.5 MG/2ML nebulizer solution Commonly known as: Pulmicort For asthma flares-take 2 mLs via nebulizer at morning and bedtime for 2 weeks or until asthma symptoms resolve   budesonide-formoterol 160-4.5 MCG/ACT inhaler Commonly known as: SYMBICORT Inhale 2 puffs into the lungs 2 (two) times daily. Rinse, gargle and spit out after use.   carvedilol 6.25 MG tablet Commonly known as: COREG Take 1 tablet (6.25 mg total) by mouth 2 (two) times daily.   famotidine 20 MG tablet Commonly known as: PEPCID Take 2 tablets by mouth once daily   hydrALAZINE 10 MG tablet Commonly known as: APRESOLINE Take 1 tablet (10 mg total) by mouth as needed (for systolic blood pressure more than 160).   ipratropium 0.06 % nasal spray Commonly known as: ATROVENT Place 1 spray into the nose 4 (four) times daily as needed for rhinitis.   isosorbide mononitrate 60 MG 24 hr tablet Commonly known as: IMDUR Take 1 tablet (60 mg total) by mouth daily. Keep upcoming appointment for  future refills.   levocetirizine 5 MG tablet Commonly known as: XYZAL Take 1 tablet (5 mg total) by mouth 2 (two) times daily as needed (Can take an extra dose during flare ups.).   multivitamin with minerals Tabs tablet Take 1 tablet by mouth daily.   NAC 600 PO Take 1 mg by mouth 2 (two) times daily.   naproxen sodium 220 MG tablet Commonly known as: ALEVE Take 220 mg by mouth daily as needed.   nitroGLYCERIN 0.4 MG  SL tablet Commonly known as: NITROSTAT Place 1 tablet (0.4 mg total) under the tongue every 5 (five) minutes as needed for chest pain.   nystatin 100000 UNIT/ML suspension Commonly known as: MYCOSTATIN Take 5 mLs (500,000 Units total) by mouth 4 (four) times daily. Swish, gargle, and swallow after Breztri use.   olmesartan 40 MG tablet Commonly known as: Benicar Take half tablet (20mg ) twice daily.   ORION 4 inclisiran or placebo 300 mg/1.5 mL SQ injection Inject 1.5 mLs (300 mg total) into the skin every 6 (six) months.   OVER THE COUNTER MEDICATION ZZZQuil   pantoprazole 40 MG tablet Commonly known as: PROTONIX Take 1 tablet (40 mg total) by mouth 2 (two) times daily.   Quercetin 500 MG Caps Take 1 mg by mouth 2 (two) times daily.    Past Medical History:  Diagnosis Date   Arthritis    "hips, lower back, knees, shoulders, hands" (05/14/2016)   Asthma dx'd in 2014   Bacterial septicemia (HCC) 01/2011   "both knee involved"   CAD (coronary artery disease)    a. NSTEMI 02/2016 s/p DES to LAD. b. Relook cath due to angina/prior high risk anatomy 05/14/16 -> continued medical therapy given concern that a stent strut could inferere with Cx itself.   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    NSTEMI (non-ST elevated myocardial infarction) (HCC) 02/2016   03/2016 03/02/2016   Recurrent upper respiratory infection (URI)    Sinus bradycardia    Unspecified tinnitus    right    Past Surgical History:  Procedure Laterality Date   BACK SURGERY     CARDIAC CATHETERIZATION N/A 03/01/2016   Procedure: Left Heart Cath and Coronary Angiography;  Surgeon: 03/03/2016, MD;  oLAD 95%, CFX/RI/RCA/RPDA 25-30%   CARDIAC CATHETERIZATION N/A 03/01/2016   Procedure: Coronary Stent Intervention;  Surgeon: 03/03/2016, MD;  Promus Premier 4.0 x  12 mm DES to oLAD   CARDIAC CATHETERIZATION N/A  05/14/2016   Procedure: Left Heart Cath and Coronary Angiography;  Surgeon: 07/14/2016, MD;  Location: [facility] INVASIVE CV LAB;  Service: Cardiovascular;  Laterality: N/A;   CARDIAC CATHETERIZATION N/A 06/17/2016   Procedure: Intravascular Pressure Wire/FFR Study;  Surgeon: 08/18/2016, MD;  Location: [facility] INVASIVE CV LAB;  Service: Cardiovascular;  Laterality: N/A;   CARDIOVASCULAR STRESS TEST  05/26/2012   EKG negative for ischemia, no ECG changes, no significant ischemia noted   I & D KNEE WITH POLY EXCHANGE Bilateral 01/2011   & complete synovectomy of 3 compartments of the right total knee/notes 02/04/2011 (replacement,partial knee replacement because of bacterial infection)   JOINT REPLACEMENT     KNEE ARTHROSCOPY Right ~ 2002   LUMBAR DISC SURGERY  ~ 1980; 2000   NASAL SINUS SURGERY  08/2011   SHOULDER ARTHROSCOPY W/ ROTATOR CUFF REPAIR Right 01/2015   TOTAL KNEE ARTHROPLASTY Bilateral 03/26/2004-09/15/2004  right-left   TRANSTHORACIC ECHOCARDIOGRAM  05/26/2012   EF >55%, mild concentric LVH    Review of systems negative except as noted in HPI / PMHx or noted below:  Review of Systems  Constitutional: Negative.   HENT: Negative.    Eyes: Negative.   Respiratory: Negative.    Cardiovascular: Negative.   Gastrointestinal: Negative.   Genitourinary: Negative.   Musculoskeletal: Negative.   Skin: Negative.   Neurological: Negative.   Endo/Heme/Allergies: Negative.   Psychiatric/Behavioral: Negative.       Objective:   Vitals:   06/30/22 0839  BP: 130/84  Pulse: (!) 56  Resp: 18  Temp: 97.6 F (36.4 C)  SpO2: 96%   Height: 6\' 3"  (190.5 cm)  Weight: 209 lb 12.8 oz (95.2 kg)   Physical Exam Constitutional:      Appearance: He is not diaphoretic.  HENT:     Head: Normocephalic.     Right Ear: Tympanic membrane, ear canal and external ear normal.     Left Ear: Tympanic membrane, ear canal and external ear normal.     Nose: Nose normal. No mucosal edema or rhinorrhea.      Mouth/Throat:     Pharynx: Uvula midline. No oropharyngeal exudate.  Eyes:     Conjunctiva/sclera: Conjunctivae normal.  Neck:     Thyroid: No thyromegaly.     Trachea: Trachea normal. No tracheal tenderness or tracheal deviation.  Cardiovascular:     Rate and Rhythm: Normal rate and regular rhythm.     Heart sounds: Normal heart sounds, S1 normal and S2 normal. No murmur heard. Pulmonary:     Effort: No respiratory distress.     Breath sounds: Normal breath sounds. No stridor. No wheezing or rales.  Lymphadenopathy:     Head:     Right side of head: No tonsillar adenopathy.     Left side of head: No tonsillar adenopathy.     Cervical: No cervical adenopathy.  Skin:    Findings: No erythema or rash.     Nails: There is no clubbing.  Neurological:     Mental Status: He is alert.     Diagnostics:    Spirometry was performed and demonstrated an FEV1 of 2.31 at 59 % of predicted.  His previous FEV1 was 1.53.  Results of a chest x-ray obtained 19 May 2022 identified the following:  The heart size and mediastinal contours are within normal limits. Both lungs are clear. The visualized skeletal structures are unremarkable.  Assessment and Plan:   1. Asthma, severe persistent, well-controlled   2. Other allergic rhinitis   3. LPRD (laryngopharyngeal reflux disease)   4. Primary hypertension    1. Continue Breztri - 2 inhalations 1-2 times per day w/ spacer (empty lungs).    2. Continue Tezepelumab injections every 4 weeks  3. Continue nystatin oral solution-5 mL swish and gargle and swallow after Breztri use.  4. Continue pantoprazole 40 mg - 2 times per day + famotidine 40 mg - in evening  5. Continue the following if needed:  A. Albuterol HFA - 2 inhalations every 4-6 hours B. Ipratropium 0.06% - 1-2 sprays each nostril every 6 hours to dry nose C. OTC antihistamine - claritin / allegra / zyrtec  6. Return to clinic in 12 weeks or earlier if problem  7. Obtain  fall flu vaccine and RSV vaccine.  8.  Consider tapering caffeine consumption especially in the late afternoon and evening.  [name] appears to be doing much better at  this point in time and we will keep him on anti-TSLP antibody and a triple inhaler.  He does have the option of decreasing his triple inhaler use to just 1 time per day and if he does well on 1 time per day he can continue on this dose but certainly go up to twice a day should he develop problems in the future.  It is quite possible that the use of his Breztri only in the morning will actually help his hypertension issue in the evening.  And, I did have a talk with him today about tapering his caffeine consumption which may help his evening hypertension issue.  We will continue to treat reflux as noted above.  I will see him back in this clinic in 12 weeks or earlier if there is a problem.  Laurette Schimke, MD Allergy / Immunology  [facility]

## 2022-06-30 NOTE — Patient Instructions (Addendum)
  1. Continue Breztri - 2 inhalations 1-2 times per day w/ spacer (empty lungs).    2. Continue Tezepelumab injections every 4 weeks  3. Continue nystatin oral solution-5 mL swish and gargle and swallow after Breztri use.  4. Continue pantoprazole 40 mg - 2 times per day + famotidine 40 mg - in evening  5. Continue the following if needed:  A. Albuterol HFA - 2 inhalations every 4-6 hours B. Ipratropium 0.06% - 1-2 sprays each nostril every 6 hours to dry nose C. OTC antihistamine - claritin / allegra / zyrtec  6. Return to clinic in 12 weeks or earlier if problem  7. Obtain fall flu vaccine and RSV vaccine.  8.  Consider tapering caffeine consumption especially in the late afternoon and evening.

## 2022-07-01 ENCOUNTER — Encounter: Payer: Self-pay | Admitting: Allergy and Immunology

## 2022-07-15 DIAGNOSIS — J4550 Severe persistent asthma, uncomplicated: Secondary | ICD-10-CM | POA: Diagnosis not present

## 2022-07-16 ENCOUNTER — Ambulatory Visit (INDEPENDENT_AMBULATORY_CARE_PROVIDER_SITE_OTHER): Payer: Medicare Other

## 2022-07-16 DIAGNOSIS — J4550 Severe persistent asthma, uncomplicated: Secondary | ICD-10-CM

## 2022-08-12 DIAGNOSIS — J4550 Severe persistent asthma, uncomplicated: Secondary | ICD-10-CM

## 2022-08-13 ENCOUNTER — Ambulatory Visit (INDEPENDENT_AMBULATORY_CARE_PROVIDER_SITE_OTHER): Payer: Medicare Other

## 2022-08-13 DIAGNOSIS — J4550 Severe persistent asthma, uncomplicated: Secondary | ICD-10-CM | POA: Diagnosis not present

## 2022-09-09 DIAGNOSIS — J4550 Severe persistent asthma, uncomplicated: Secondary | ICD-10-CM | POA: Diagnosis not present

## 2022-09-10 ENCOUNTER — Ambulatory Visit (INDEPENDENT_AMBULATORY_CARE_PROVIDER_SITE_OTHER): Payer: Medicare Other

## 2022-09-10 DIAGNOSIS — J4550 Severe persistent asthma, uncomplicated: Secondary | ICD-10-CM | POA: Diagnosis not present

## 2022-09-22 ENCOUNTER — Ambulatory Visit (INDEPENDENT_AMBULATORY_CARE_PROVIDER_SITE_OTHER): Payer: Medicare Other | Admitting: Allergy and Immunology

## 2022-09-22 VITALS — BP 130/86 | HR 86 | Temp 98.2°F | Resp 16 | Ht 74.5 in | Wt 215.8 lb

## 2022-09-22 DIAGNOSIS — K219 Gastro-esophageal reflux disease without esophagitis: Secondary | ICD-10-CM | POA: Diagnosis not present

## 2022-09-22 DIAGNOSIS — J3089 Other allergic rhinitis: Secondary | ICD-10-CM

## 2022-09-22 DIAGNOSIS — J4550 Severe persistent asthma, uncomplicated: Secondary | ICD-10-CM | POA: Diagnosis not present

## 2022-09-22 DIAGNOSIS — I2510 Atherosclerotic heart disease of native coronary artery without angina pectoris: Secondary | ICD-10-CM | POA: Diagnosis not present

## 2022-09-22 MED ORDER — PANTOPRAZOLE SODIUM 40 MG PO TBEC: 40.0000 mg | DELAYED_RELEASE_TABLET | Freq: Two times a day (BID) | ORAL | 5 refills | Status: DC

## 2022-09-22 MED ORDER — ALBUTEROL SULFATE (2.5 MG/3ML) 0.083% IN NEBU: 2.5000 mg | INHALATION_SOLUTION | RESPIRATORY_TRACT | 1 refills | Status: DC | PRN

## 2022-09-22 MED ORDER — IPRATROPIUM BROMIDE 0.06 % NA SOLN: 1.0000 | Freq: Four times a day (QID) | NASAL | 5 refills | Status: DC | PRN

## 2022-09-22 MED ORDER — ALBUTEROL SULFATE HFA 108 (90 BASE) MCG/ACT IN AERS: 2.0000 | INHALATION_SPRAY | RESPIRATORY_TRACT | 1 refills | Status: DC | PRN

## 2022-09-22 MED ORDER — BREZTRI AEROSPHERE 160-9-4.8 MCG/ACT IN AERO
2.0000 | INHALATION_SPRAY | Freq: Two times a day (BID) | RESPIRATORY_TRACT | 5 refills | Status: DC
Start: 2022-09-22 — End: 2023-03-23

## 2022-09-22 MED ORDER — BUDESONIDE 0.5 MG/2ML IN SUSP: RESPIRATORY_TRACT | 3 refills | Status: DC

## 2022-09-22 MED ORDER — FAMOTIDINE 20 MG PO TABS: 40.0000 mg | ORAL_TABLET | Freq: Every morning | ORAL | 1 refills | Status: DC

## 2022-09-22 MED ORDER — LEVOCETIRIZINE DIHYDROCHLORIDE 5 MG PO TABS: 5.0000 mg | ORAL_TABLET | Freq: Two times a day (BID) | ORAL | 5 refills | Status: DC | PRN

## 2022-09-22 NOTE — Progress Notes (Unsigned)
[place] - [place] - [place] - [place] - [place]   Follow-up Note  Referring Provider: Roe Coombs* Primary Provider: [facility] Date of Office Visit: 09/22/2022  Subjective:   Louis Perez (DOB: January 12, 1951) is a 71 y.o. male who [facility] in re-evaluation of the following:  HPI: Ed returns to this clinic in reevaluation of asthma, allergic rhinitis, LPR.  I last saw him in this clinic on 30 June 2022.  He continues to do very well while utilizing anti-TSLP antibody to control his respiratory tract disease.  He has not had a flareup of his asthma and has not required a systemic steroid and rarely uses a short acting bronchodilator and can exercise without any problem.  He has been able to taper down his Breztri to 1 time per day.  He had very little issues with his nose.  His reflux is under good control with a proton pump inhibitor and H2 receptor blocker.  Allergies as of 09/22/2022       Reactions   Oxycodone Anaphylaxis, Swelling   Oxycodone-acetaminophen Anaphylaxis   Tolerates Vicodin   Oxycontin [oxycodone Hcl] Anaphylaxis   Percocet [oxycodone-acetaminophen] Anaphylaxis   Tolerates Vicodin   Ambien [zolpidem Tartrate] Other (See Comments)   Mood changes    Zolpidem Other (See Comments)   Mood changes         Medication List      albuterol (2.5 MG/3ML) 0.083% nebulizer solution Commonly known as: PROVENTIL Take 3 mLs (2.5 mg total) by nebulization every 4 (four) hours as needed for wheezing or shortness of breath.   albuterol 108 (90 Base) MCG/ACT inhaler Commonly known as: Ventolin HFA Inhale 2 puffs into the lungs every 4 (four) hours as needed for wheezing or shortness of breath.   aspirin 81 MG tablet Take 81 mg by mouth at bedtime.   Breztri Aerosphere 160-9-4.8 MCG/ACT Aero Generic drug: Budeson-Glycopyrrol-Formoterol Inhale 2 puffs into the  lungs in the morning and at bedtime.   carvedilol 6.25 MG tablet Commonly known as: COREG Take 1 tablet (6.25 mg total) by mouth 2 (two) times daily.   famotidine 20 MG tablet Commonly known as: PEPCID Take 2 tablets by mouth once daily   hydrALAZINE 10 MG tablet Commonly known as: APRESOLINE Take 1 tablet (10 mg total) by mouth as needed (for systolic blood pressure more than 160).   ipratropium 0.06 % nasal spray Commonly known as: ATROVENT Place 1 spray into the nose 4 (four) times daily as needed for rhinitis.   isosorbide mononitrate 60 MG 24 hr tablet Commonly known as: IMDUR Take 1 tablet (60 mg total) by mouth daily. Keep upcoming appointment for future refills.   levocetirizine 5 MG tablet Commonly known as: XYZAL Take 1 tablet (5 mg total) by mouth 2 (two) times daily as needed (Can take an extra dose during flare ups.).   multivitamin with minerals Tabs tablet Take 1 tablet by mouth daily.   NAC 600 PO Take 1 mg by mouth 2 (two) times daily.   naproxen sodium 220 MG tablet Commonly known as: ALEVE Take 220 mg by mouth daily as needed.   nitroGLYCERIN 0.4 MG SL tablet Commonly known as: NITROSTAT Place 1 tablet (0.4 mg total) under the tongue every 5 (five) minutes as needed for chest pain.   nystatin 100000 UNIT/ML suspension Commonly known as: MYCOSTATIN Take 5 mLs (500,000 Units total) by mouth 4 (four) times daily. Swish, gargle, and  swallow after Breztri use.   olmesartan 40 MG tablet Commonly known as: Benicar Take half tablet (20mg ) twice daily.   ORION 4 inclisiran or placebo 300 mg/1.5 mL SQ injection Inject 1.5 mLs (300 mg total) into the skin every 6 (six) months.   OVER THE COUNTER MEDICATION ZZZQuil   pantoprazole 40 MG tablet Commonly known as: PROTONIX Take 1 tablet (40 mg total) by mouth 2 (two) times daily.   Quercetin 500 MG Caps Take 1 mg by mouth 2 (two) times daily.    Past Medical History:  Diagnosis Date   Arthritis     "hips, lower back, knees, shoulders, hands" (05/14/2016)   Asthma dx'd in 2014   Bacterial septicemia ([place]) 01/2011   "both knee involved"   CAD (coronary artery disease)    a. NSTEMI 02/2016 s/p DES to LAD. b. Relook cath due to angina/prior high risk anatomy 05/14/16 -> continued medical therapy given concern that a stent strut could inferere with Cx itself.   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    NSTEMI (non-ST elevated myocardial infarction) ([place]) 02/2016   Archie Endo 03/02/2016   Recurrent upper respiratory infection (URI)    Sinus bradycardia    Unspecified tinnitus    right    Past Surgical History:  Procedure Laterality Date   BACK SURGERY     CARDIAC CATHETERIZATION N/A 03/01/2016   Procedure: Left Heart Cath and Coronary Angiography;  Surgeon: Sherren Mocha, MD;  oLAD 95%, CFX/RI/RCA/RPDA 25-30%   CARDIAC CATHETERIZATION N/A 03/01/2016   Procedure: Coronary Stent Intervention;  Surgeon: Sherren Mocha, MD;  Promus Premier 4.0 x  12 mm DES to oLAD   CARDIAC CATHETERIZATION N/A 05/14/2016   Procedure: Left Heart Cath and Coronary Angiography;  Surgeon: Troy Sine, MD;  Location: [facility];  Service: Cardiovascular;  Laterality: N/A;   CARDIAC CATHETERIZATION N/A 06/17/2016   Procedure: Intravascular Pressure Wire/FFR Study;  Surgeon: Sherren Mocha, MD;  Location: [facility];  Service: Cardiovascular;  Laterality: N/A;   CARDIOVASCULAR STRESS TEST  05/26/2012   EKG negative for ischemia, no ECG changes, no significant ischemia noted   I & D KNEE WITH POLY EXCHANGE Bilateral 01/2011   & complete synovectomy of 3 compartments of the right total knee/notes 02/04/2011 (replacement,partial knee replacement because of bacterial infection)   JOINT REPLACEMENT     KNEE ARTHROSCOPY Right ~ 2002   LUMBAR Wing  ~ 1980; 2000   NASAL SINUS SURGERY  08/2011   SHOULDER  ARTHROSCOPY W/ ROTATOR CUFF REPAIR Right 01/2015   TOTAL KNEE ARTHROPLASTY Bilateral 03/26/2004-09/15/2004    right-left   TRANSTHORACIC ECHOCARDIOGRAM  05/26/2012   EF >55%, mild concentric LVH    Review of systems negative except as noted in HPI / PMHx or noted below:  Review of Systems  Constitutional: Negative.   HENT: Negative.    Eyes: Negative.   Respiratory: Negative.    Cardiovascular: Negative.   Gastrointestinal: Negative.   Genitourinary: Negative.   Musculoskeletal: Negative.   Skin: Negative.   Neurological: Negative.   Endo/Heme/Allergies: Negative.   Psychiatric/Behavioral: Negative.       Objective:   Vitals:   09/22/22 0858  BP: 130/86  Pulse: 86  Resp: 16  Temp: 98.2 F (36.8 C)  SpO2: 95%   Height: 6' 2.5" (189.2 cm)  Weight: 215 lb 12.8 oz (97.9 kg)  Physical Exam Constitutional:      Appearance: He is not diaphoretic.  HENT:     Head: Normocephalic.     Right Ear: Tympanic membrane, ear canal and external ear normal.     Left Ear: Tympanic membrane, ear canal and external ear normal.     Nose: Nose normal. No mucosal edema or rhinorrhea.     Mouth/Throat:     Pharynx: Uvula midline. No oropharyngeal exudate.  Eyes:     Conjunctiva/sclera: Conjunctivae normal.  Neck:     Thyroid: No thyromegaly.     Trachea: Trachea normal. No tracheal tenderness or tracheal deviation.  Cardiovascular:     Rate and Rhythm: Normal rate and regular rhythm.     Heart sounds: Normal heart sounds, S1 normal and S2 normal. No murmur heard. Pulmonary:     Effort: No respiratory distress.     Breath sounds: Normal breath sounds. No stridor. No wheezing or rales.  Lymphadenopathy:     Head:     Right side of head: No tonsillar adenopathy.     Left side of head: No tonsillar adenopathy.     Cervical: No cervical adenopathy.  Skin:    Findings: No erythema or rash.     Nails: There is no clubbing.  Neurological:     Mental Status: He is alert.      Diagnostics:    Spirometry was performed and demonstrated an FEV1 of 2.40 at 64 % of predicted.  Assessment and Plan:   1. Asthma, severe persistent, well-controlled   2. Other allergic rhinitis   3. LPRD (laryngopharyngeal reflux disease)    1. Continue Breztri - 2 inhalations 1-2 times per day w/ spacer (empty lungs).    2. Continue Tezepelumab injections every 4 weeks  3. Continue nystatin oral solution-5 mL swish and gargle and swallow after Breztri use.  4. Continue pantoprazole 40 mg - 2 times per day + famotidine 40 mg - in evening  5. Continue the following if needed:  A. Albuterol HFA - 2 inhalations every 4-6 hours B. Ipratropium 0.06% - 1-2 sprays each nostril every 6 hours to dry nose C. OTC antihistamine - claritin / allegra / zyrtec  6. Return to clinic in 6 months or earlier if problem  7. Obtain fall flu vaccine and RSV vaccine.  Ed appears to be doing quite well while utilizing anti-TSLP antibody to control his respiratory tract inflammatory condition.  He has been able to taper down his triple inhaler.  As well, his reflux is under very good control on his current plan.  Assuming he continues to do well with this plan I will see him back in this clinic in 6 months or earlier if there is a problem.  Laurette Schimke, MD Allergy / Immunology [facility]

## 2022-09-22 NOTE — Patient Instructions (Addendum)
  1. Continue Breztri - 2 inhalations 1-2 times per day w/ spacer (empty lungs).    2. Continue Tezepelumab injections every 4 weeks  3. Continue nystatin oral solution-5 mL swish and gargle and swallow after Breztri use.  4. Continue pantoprazole 40 mg - 2 times per day + famotidine 40 mg - in evening  5. Continue the following if needed:  A. Albuterol HFA - 2 inhalations every 4-6 hours B. Ipratropium 0.06% - 1-2 sprays each nostril every 6 hours to dry nose C. OTC antihistamine - claritin / allegra / zyrtec  6. Return to clinic in 6 months or earlier if problem  7. Obtain fall flu vaccine and RSV vaccine.

## 2022-09-23 ENCOUNTER — Encounter: Payer: Self-pay | Admitting: Allergy and Immunology

## 2022-09-23 NOTE — Addendum Note (Signed)
Addended by: Larence Penning on: 09/23/2022 05:56 PM   Modules accepted: Orders

## 2022-10-08 ENCOUNTER — Ambulatory Visit: Payer: Medicare Other

## 2022-10-14 DIAGNOSIS — J4550 Severe persistent asthma, uncomplicated: Secondary | ICD-10-CM | POA: Diagnosis not present

## 2022-10-15 ENCOUNTER — Ambulatory Visit (INDEPENDENT_AMBULATORY_CARE_PROVIDER_SITE_OTHER): Payer: Medicare Other

## 2022-10-15 DIAGNOSIS — J4550 Severe persistent asthma, uncomplicated: Secondary | ICD-10-CM | POA: Diagnosis not present

## 2022-11-02 ENCOUNTER — Other Ambulatory Visit: Payer: Self-pay

## 2022-11-02 ENCOUNTER — Ambulatory Visit: Payer: Medicare Other | Attending: Physical Medicine and Rehabilitation | Admitting: Physical Therapy

## 2022-11-02 DIAGNOSIS — M5459 Other low back pain: Secondary | ICD-10-CM | POA: Diagnosis not present

## 2022-11-02 NOTE — Therapy (Signed)
OUTPATIENT PHYSICAL THERAPY THORACOLUMBAR EVALUATION   Patient Name: Louis Perez MRN: [id number] DOB:1951/04/04, 71 y.o., male Today's Date: 11/02/2022  END OF SESSION:  PT End of Session - 11/02/22 0918     Visit Number 1    Number of Visits 10    Date for PT Re-Evaluation 01/31/23    Authorization Type FOTO AT LEAST EVERY 5TH VISIT.  PROGRESS NOTE AT 10TH VISIT.  KX MODIFIER AFTER 15 VISITS.             Past Medical History:  Diagnosis Date   Arthritis    "hips, lower back, knees, shoulders, hands" (05/14/2016)   Asthma dx'd in 2014   Bacterial septicemia ([facility]) 01/2011   "both knee involved"   CAD (coronary artery disease)    a. NSTEMI 02/2016 s/p DES to LAD. b. Relook cath due to angina/prior high risk anatomy 05/14/16 -> continued medical therapy given concern that a stent strut could inferere with Cx itself.   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    NSTEMI (non-ST elevated myocardial infarction) ([facility]) 02/2016   [name] 03/02/2016   Recurrent upper respiratory infection (URI)    Sinus bradycardia    Unspecified tinnitus    right   Past Surgical History:  Procedure Laterality Date   BACK SURGERY     CARDIAC CATHETERIZATION N/A 03/01/2016   Procedure: Left Heart Cath and Coronary Angiography;  Surgeon: Tonny Bollman, MD;  oLAD 95%, CFX/RI/RCA/RPDA 25-30%   CARDIAC CATHETERIZATION N/A 03/01/2016   Procedure: Coronary Stent Intervention;  Surgeon: Tonny Bollman, MD;  Promus Premier 4.0 x  12 mm DES to oLAD   CARDIAC CATHETERIZATION N/A 05/14/2016   Procedure: Left Heart Cath and Coronary Angiography;  Surgeon: Lennette Bihari, MD;  Location: [facility];  Service: Cardiovascular;  Laterality: N/A;   CARDIAC CATHETERIZATION N/A 06/17/2016   Procedure: Intravascular Pressure Wire/FFR Study;  Surgeon: Tonny Bollman, MD;  Location: [facility] IN[facility];  Service:  Cardiovascular;  Laterality: N/A;   CARDIOVASCULAR STRESS TEST  05/26/2012   EKG negative for ischemia, no ECG changes, no significant ischemia noted   I & D KNEE WITH POLY EXCHANGE Bilateral 01/2011   & complete synovectomy of 3 compartments of the right total knee/notes 02/04/2011 (replacement,partial knee replacement because of bacterial infection)   JOINT REPLACEMENT     KNEE ARTHROSCOPY Right ~ 2002   LUMBAR DISC SURGERY  ~ 1980; 2000   NASAL SINUS SURGERY  08/2011   SHOULDER ARTHROSCOPY W/ ROTATOR CUFF REPAIR Right 01/2015   TOTAL KNEE ARTHROPLASTY Bilateral 03/26/2004-09/15/2004    right-left   TRANSTHORACIC ECHOCARDIOGRAM  05/26/2012   EF >55%, mild concentric LVH   Patient Active Problem List   Diagnosis Date Noted   Constipation 06/29/2022   Other specified abnormal findings of blood chemistry 06/29/2022   Severe persistent asthma with acute exacerbation 11/25/2020   Shortness of breath 11/25/2020   Viral upper respiratory infection 11/25/2020   Asthma, severe persistent, well-controlled 11/09/2018   Other allergic rhinitis 11/09/2018   LPRD (laryngopharyngeal reflux disease) 11/09/2018   Stomatitis and mucositis 11/09/2018   Tinnitus of both ears 09/05/2018   Visual disturbance 09/01/2018   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   Sensorineural hearing loss (SNHL) of right ear 07/11/2018   Pansinusitis 01/06/2017   Hyperlipidemia LDL goal <70 07/23/2016   Anginal chest pain at rest 06/17/2016  CAD in native artery 06/15/2016   Stented coronary artery 06/15/2016   Pain in the chest 05/28/2016   Sinus bradycardia 05/15/2016   Unstable angina ([facility]) 05/14/2016   CAD (coronary artery disease), native coronary artery 05/14/2016   Adjustment insomnia 02/28/2016   Gilbert's syndrome 02/28/2016   Hematospermia 02/28/2016   Relative erythrocytosis 02/28/2016   Dyslipidemia 09/28/2013   GERD (gastroesophageal reflux disease) 09/28/2013   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  Rationale for Evaluation and Treatment: Rehabilitation  THERAPY DIAG:  No diagnosis found.  ONSET DATE: Ongoing but flare-up about 2 months ago.  SUBJECTIVE:                                                                                                                                                                                           SUBJECTIVE STATEMENT: The patient presents to the clinic with c/o low back pain and radiation into his left LE.  He states he has been back pain over the years, including 2 surgeries, but had a significant flare-up approximately 2 months ago.  Today, his pain is rated at a low 2/10 but can increase to higher levels with bending and increased physical activity (ie:  lifting).  He describes his pain as an ache and shooting into the left LE and numb feeling over his left big toe.  PERTINENT HISTORY:  HTN, coronary stent, CAD, bilateral TKA's, 2 lumbar surgeries, h/o bilateral shoulder pain.  PAIN:  Are you having pain? Yes: NPRS scale: 2/10 Pain location: Low back, left LE. Pain description: Ache, shooting, numb. Aggravating factors: Sitting. Relieving factors: As above.  PRECAUTIONS: None  WEIGHT BEARING RESTRICTIONS: No  FALLS:  Has patient fallen in last 6 months? Yes. Number of falls 2-3, "tripping."  LIVING ENVIRONMENT: Lives with: lives with their spouse Lives in: House/apartment Has following equipment at home: None  OCCUPATION: Retired.  PLOF: Independent  PATIENT GOALS: Return to pre-flare-up/exacerbation status.  NEXT MD VISIT:   OBJECTIVE:   DIAGNOSTIC FINDINGS:  MRI:  Multi-level stenosis.   SENSATION: C/o left great toe numbness.  POSTURE:  Left LE longer than right corrected with a SKTC stretc.  PALPATION: Some c/o left lower  lumbar tenderness.  LUMBAR ROM:  Full active lumbar flexion and extension.   LOWER EXTREMITY MMT:   Left ankle dorsiflexion graded at 4+/5 compared to left.  Other LE major muscle groups are normal.  LUMBAR SPECIAL TESTS:  Straight leg raise test: Negative   GAIT: WNL.  TODAY'S TREATMENT:                                                                                                                              DATE: HMP and IFC at 80-150 Hz on 40% scan x 20 minutes to patient's lower lumbar region.  Patient tolerated treatment without complaint with normal modality response following removal of modality.   ASSESSMENT:  CLINICAL IMPRESSION: The patient presents to [facility] with c/o low back pian with a recent exacerbation about two months ago for no apparent reason.  He had some left lumbar tenderness and pain down the length of his left LE with some numbness reported over his left great toe.  His left ankle dorsiflexion is a bit weaker than his right.  He demonstrated a negative SLR test.  He is very motivated to improve and leads a very active lifestyle.    Patient will benefit from skilled physical therapy intervention to address pain and deficits.  OBJECTIVE IMPAIRMENTS: decreased activity tolerance, postural dysfunction, and pain.   ACTIVITY LIMITATIONS: lifting and bending   PERSONAL FACTORS: 1 comorbidity: 2 previous lumbar surgeries  are also affecting patient's functional outcome.   REHAB POTENTIAL: Excellent  CLINICAL DECISION MAKING: Stable/uncomplicated  EVALUATION COMPLEXITY: Low   GOALS:  SHORT TERM GOALS: Target date: 11/16/22  Ind with an HEP. Baseline: Goal status: INITIAL    LONG TERM GOALS: Target date: 12/07/22  Eliminate left LE symptoms. Baseline:  Goal status: INITIAL  2.  Perform ADL's with pain not > 2-3/10. Baseline:  Goal status: INITIAL   PLAN:  PT FREQUENCY: 2x/week  PT DURATION: other: 5 weeks.  PLANNED INTERVENTIONS:  Therapeutic exercises, Therapeutic activity, Patient/Family education, Self Care, Dry Needling, Electrical stimulation, Cryotherapy, Moist heat, Ultrasound, and Manual therapy.  PLAN FOR NEXT SESSION: Modalities and STW/M as needed.  Core exercise progression.  S and DKTC, hip bridges.   Mekiah Cambridge, [place], PT 11/02/2022, 9:20 AM

## 2022-11-10 ENCOUNTER — Ambulatory Visit: Payer: Medicare Other | Admitting: *Deleted

## 2022-11-10 DIAGNOSIS — M5459 Other low back pain: Secondary | ICD-10-CM

## 2022-11-10 NOTE — Therapy (Signed)
OUTPATIENT PHYSICAL THERAPY THORACOLUMBAR TREATMENT   Patient Name: Louis Perez MRN: [id number] DOB:Jun 04, 1951, 71 y.o., male Today's Date: 11/10/2022  END OF SESSION:  PT End of Session - 11/10/22 0816     Visit Number 2    Number of Visits 10    Date for PT Re-Evaluation 01/31/23    Authorization Type FOTO AT LEAST EVERY 5TH VISIT.  PROGRESS NOTE AT 10TH VISIT.  KX MODIFIER AFTER 15 VISITS.    PT Start Time 0815    PT Stop Time 0903    PT Time Calculation (min) 48 min             Past Medical History:  Diagnosis Date   Arthritis    "hips, lower back, knees, shoulders, hands" (05/14/2016)   Asthma dx'd in 2014   Bacterial septicemia (HCC) 01/2011   "both knee involved"   CAD (coronary artery disease)    a. NSTEMI 02/2016 s/p DES to LAD. b. Relook cath due to angina/prior high risk anatomy 05/14/16 -> continued medical therapy given concern that a stent strut could inferere with Cx itself.   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    NSTEMI (non-ST elevated myocardial infarction) (HCC) 02/2016   [name] 03/02/2016   Recurrent upper respiratory infection (URI)    Sinus bradycardia    Unspecified tinnitus    right   Past Surgical History:  Procedure Laterality Date   BACK SURGERY     CARDIAC CATHETERIZATION N/A 03/01/2016   Procedure: Left Heart Cath and Coronary Angiography;  Surgeon: Tonny Bollman, MD;  oLAD 95%, CFX/RI/RCA/RPDA 25-30%   CARDIAC CATHETERIZATION N/A 03/01/2016   Procedure: Coronary Stent Intervention;  Surgeon: Tonny Bollman, MD;  Promus Premier 4.0 x  12 mm DES to oLAD   CARDIAC CATHETERIZATION N/A 05/14/2016   Procedure: Left Heart Cath and Coronary Angiography;  Surgeon: Lennette Bihari, MD;  Location: [facility] INVASIVE CV LAB;  Service: Cardiovascular;  Laterality: N/A;   CARDIAC CATHETERIZATION N/A 06/17/2016   Procedure: Intravascular Pressure Wire/FFR Study;   Surgeon: Tonny Bollman, MD;  Location: [facility] INVASIVE CV LAB;  Service: Cardiovascular;  Laterality: N/A;   CARDIOVASCULAR STRESS TEST  05/26/2012   EKG negative for ischemia, no ECG changes, no significant ischemia noted   I & D KNEE WITH POLY EXCHANGE Bilateral 01/2011   & complete synovectomy of 3 compartments of the right total knee/notes 02/04/2011 (replacement,partial knee replacement because of bacterial infection)   JOINT REPLACEMENT     KNEE ARTHROSCOPY Right ~ 2002   LUMBAR DISC SURGERY  ~ 1980; 2000   NASAL SINUS SURGERY  08/2011   SHOULDER ARTHROSCOPY W/ ROTATOR CUFF REPAIR Right 01/2015   TOTAL KNEE ARTHROPLASTY Bilateral 03/26/2004-09/15/2004    right-left   TRANSTHORACIC ECHOCARDIOGRAM  05/26/2012   EF >55%, mild concentric LVH   Patient Active Problem List   Diagnosis Date Noted   Constipation 06/29/2022   Other specified abnormal findings of blood chemistry 06/29/2022   Severe persistent asthma with acute exacerbation 11/25/2020   Shortness of breath 11/25/2020   Viral upper respiratory infection 11/25/2020   Asthma, severe persistent, well-controlled 11/09/2018   Other allergic rhinitis 11/09/2018   LPRD (laryngopharyngeal reflux disease) 11/09/2018   Stomatitis and mucositis 11/09/2018   Tinnitus of both ears 09/05/2018   Visual disturbance 09/01/2018   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   Sensorineural hearing loss (SNHL) of right  ear 07/11/2018   Pansinusitis 01/06/2017   Hyperlipidemia LDL goal <70 07/23/2016   Anginal chest pain at rest 06/17/2016   CAD in native artery 06/15/2016   Stented coronary artery 06/15/2016   Pain in the chest 05/28/2016   Sinus bradycardia 05/15/2016   Unstable angina (HCC) 05/14/2016   CAD (coronary artery disease), native coronary artery 05/14/2016   Adjustment insomnia 02/28/2016   Gilbert's syndrome 02/28/2016   Hematospermia 02/28/2016   Relative erythrocytosis 02/28/2016   Dyslipidemia 09/28/2013   GERD  (gastroesophageal reflux disease) 09/28/2013   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  Rationale for Evaluation and Treatment: Rehabilitation  THERAPY DIAG:  Other low back pain  ONSET DATE: Ongoing but flare-up about 2 months ago.  SUBJECTIVE:                                                                                                                                                                                           SUBJECTIVE STATEMENT: Pt reports doing okay so far this morning 2-3/10 LBP.   PERTINENT HISTORY:  HTN, coronary stent, CAD, bilateral TKA's, 2 lumbar surgeries, h/o bilateral shoulder pain.  PAIN:  Are you having pain? Yes: NPRS scale: 2-3/10 Pain location: Low back, left LE. Pain description: Ache, shooting, numb. Aggravating factors: Sitting. Relieving factors: As above.  PRECAUTIONS: None  WEIGHT BEARING RESTRICTIONS: No  FALLS:  Has patient fallen in last 6 months? Yes. Number of falls 2-3, "tripping."  LIVING ENVIRONMENT: Lives with: lives with their spouse Lives in: House/apartment Has following equipment at home: None  OCCUPATION: Retired.  PLOF: Independent  PATIENT GOALS: Return to pre-flare-up/exacerbation status.  NEXT MD VISIT:   OBJECTIVE:    TODAY'S TREATMENT:  DATE: 11-10-22                                     EXERCISE LOG  Exercise Repetitions and Resistance Comments  TM 2 MPH x 10 mins focus on posture   AB bracing X5 hold 5 secs   Dying bug X6 hold 5 secs each side   Mcgill curl up 2X 6 hold 5secs   Bridge X 10 hold 10 secs    Blank cell = exercise  not performed today  Disussed Walking program and reviewed ADL's and movement patterns to decrease pain triggers Handouts given for above exs for HEP ASSESSMENT:  CLINICAL IMPRESSION: Pt arrived today doing fairly well with minimal LBP. Rx focused on Movement patterns for ADL's to decrease pain triggers and core activation as well as stability exercises. Handout given for HEP. Review HEP next RX  OBJECTIVE IMPAIRMENTS: decreased activity tolerance, postural dysfunction, and pain.   ACTIVITY LIMITATIONS: lifting and bending   PERSONAL FACTORS: 1 comorbidity: 2 previous lumbar surgeries  are also affecting patient's functional outcome.   REHAB POTENTIAL: Excellent  CLINICAL DECISION MAKING: Stable/uncomplicated  EVALUATION COMPLEXITY: Low   GOALS:  SHORT TERM GOALS: Target date: 11/16/22  Ind with an HEP. Baseline: Goal status: INITIAL    LONG TERM GOALS: Target date: 12/07/22  Eliminate left LE symptoms. Baseline:  Goal status: INITIAL  2.  Perform ADL's with pain not > 2-3/10. Baseline:  Goal status: INITIAL   PLAN:  PT FREQUENCY: 2x/week  PT DURATION: other: 5 weeks.  PLANNED INTERVENTIONS: Therapeutic exercises, Therapeutic activity, Patient/Family education, Self Care, Dry Needling, Electrical stimulation, Cryotherapy, Moist heat, Ultrasound, and Manual therapy.  PLAN FOR NEXT SESSION: Modalities and STW/M as needed.  Core exercise progression.  S and DKTC, hip bridges.   Kadra Kohan,CHRIS, PTA 11/10/2022, 9:40 AM

## 2022-11-11 DIAGNOSIS — J4550 Severe persistent asthma, uncomplicated: Secondary | ICD-10-CM | POA: Diagnosis not present

## 2022-11-12 ENCOUNTER — Ambulatory Visit (INDEPENDENT_AMBULATORY_CARE_PROVIDER_SITE_OTHER): Payer: Medicare Other

## 2022-11-12 DIAGNOSIS — J4550 Severe persistent asthma, uncomplicated: Secondary | ICD-10-CM

## 2022-11-13 ENCOUNTER — Ambulatory Visit: Payer: Medicare Other | Attending: Physical Medicine and Rehabilitation | Admitting: Physical Therapy

## 2022-11-13 ENCOUNTER — Encounter: Payer: Self-pay | Admitting: Physical Therapy

## 2022-11-13 DIAGNOSIS — M5459 Other low back pain: Secondary | ICD-10-CM | POA: Diagnosis present

## 2022-11-13 NOTE — Therapy (Signed)
OUTPATIENT PHYSICAL THERAPY THORACOLUMBAR TREATMENT   Patient Name: Louis Perez MRN: [id number] DOB:November 07, 1951, 71 y.o., male Today's Date: 11/13/2022  END OF SESSION:  PT End of Session - 11/13/22 0831     Visit Number 3    Number of Visits 10    Date for PT Re-Evaluation 01/31/23    Authorization Type FOTO AT LEAST EVERY 5TH VISIT.  PROGRESS NOTE AT 10TH VISIT.  KX MODIFIER AFTER 15 VISITS.    PT Start Time [phone]    PT Stop Time 0900    PT Time Calculation (min) 39 min    Activity Tolerance Patient tolerated treatment well    Behavior During Therapy WFL for tasks assessed/performed            Past Medical History:  Diagnosis Date   Arthritis    "hips, lower back, knees, shoulders, hands" (05/14/2016)   Asthma dx'd in 2014   Bacterial septicemia (HCC) 01/2011   "both knee involved"   CAD (coronary artery disease)    a. NSTEMI 02/2016 s/p DES to LAD. b. Relook cath due to angina/prior high risk anatomy 05/14/16 -> continued medical therapy given concern that a stent strut could inferere with Cx itself.   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    NSTEMI (non-ST elevated myocardial infarction) (HCC) 02/2016   [name] 03/02/2016   Recurrent upper respiratory infection (URI)    Sinus bradycardia    Unspecified tinnitus    right   Past Surgical History:  Procedure Laterality Date   BACK SURGERY     CARDIAC CATHETERIZATION N/A 03/01/2016   Procedure: Left Heart Cath and Coronary Angiography;  Surgeon: Tonny Bollman, MD;  oLAD 95%, CFX/RI/RCA/RPDA 25-30%   CARDIAC CATHETERIZATION N/A 03/01/2016   Procedure: Coronary Stent Intervention;  Surgeon: Tonny Bollman, MD;  Promus Premier 4.0 x  12 mm DES to oLAD   CARDIAC CATHETERIZATION N/A 05/14/2016   Procedure: Left Heart Cath and Coronary Angiography;  Surgeon: Lennette Bihari, MD;  Location: [facility] INVASIVE CV LAB;  Service:  Cardiovascular;  Laterality: N/A;   CARDIAC CATHETERIZATION N/A 06/17/2016   Procedure: Intravascular Pressure Wire/FFR Study;  Surgeon: Tonny Bollman, MD;  Location: [facility] INVASIVE CV LAB;  Service: Cardiovascular;  Laterality: N/A;   CARDIOVASCULAR STRESS TEST  05/26/2012   EKG negative for ischemia, no ECG changes, no significant ischemia noted   I & D KNEE WITH POLY EXCHANGE Bilateral 01/2011   & complete synovectomy of 3 compartments of the right total knee/notes 02/04/2011 (replacement,partial knee replacement because of bacterial infection)   JOINT REPLACEMENT     KNEE ARTHROSCOPY Right ~ 2002   LUMBAR DISC SURGERY  ~ 1980; 2000   NASAL SINUS SURGERY  08/2011   SHOULDER ARTHROSCOPY W/ ROTATOR CUFF REPAIR Right 01/2015   TOTAL KNEE ARTHROPLASTY Bilateral 03/26/2004-09/15/2004    right-left   TRANSTHORACIC ECHOCARDIOGRAM  05/26/2012   EF >55%, mild concentric LVH   Patient Active Problem List   Diagnosis Date Noted   Constipation 06/29/2022   Other specified abnormal findings of blood chemistry 06/29/2022   Severe persistent asthma with acute exacerbation 11/25/2020   Shortness of breath 11/25/2020   Viral upper respiratory infection 11/25/2020   Asthma, severe persistent, well-controlled 11/09/2018   Other allergic rhinitis 11/09/2018   LPRD (laryngopharyngeal reflux disease) 11/09/2018   Stomatitis and mucositis 11/09/2018   Tinnitus of both ears 09/05/2018  Visual disturbance 09/01/2018   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   Sensorineural hearing loss (SNHL) of right ear 07/11/2018   Pansinusitis 01/06/2017   Hyperlipidemia LDL goal <70 07/23/2016   Anginal chest pain at rest 06/17/2016   CAD in native artery 06/15/2016   Stented coronary artery 06/15/2016   Pain in the chest 05/28/2016   Sinus bradycardia 05/15/2016   Unstable angina (HCC) 05/14/2016   CAD (coronary artery disease), native coronary artery 05/14/2016   Adjustment insomnia 02/28/2016   Gilbert's syndrome  02/28/2016   Hematospermia 02/28/2016   Relative erythrocytosis 02/28/2016   Dyslipidemia 09/28/2013   GERD (gastroesophageal reflux disease) 09/28/2013   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  Rationale for Evaluation and Treatment: Rehabilitation  THERAPY DIAG:  Other low back pain  ONSET DATE: Ongoing but flare-up about 2 months ago.  SUBJECTIVE:                                                                                                                                                                                           SUBJECTIVE STATEMENT: Had some hip pain upon waking this morning but has used stationary bike this week. Reports he has not had time for his HEP this week but plans to begin.   PERTINENT HISTORY:  HTN, coronary stent, CAD, bilateral TKA's, 2 lumbar surgeries, h/o bilateral shoulder pain.  PAIN: None  PRECAUTIONS: None  PATIENT GOALS: Return to pre-flare-up/exacerbation status.  NEXT MD VISIT:   OBJECTIVE:   TODAY'S TREATMENT:                                                                                                                              DATE: 11-13-22                                    EXERCISE LOG  Exercise Repetitions and Resistance  Comments  TM 2 MPH x 12 mins focus on posture   Arm raise at sink X10 reps   Leg raise at sink X10 reps   Opp arm/leg raise at sink X15 reps   Marching 20 reps 5 sec holds   POE X1 min   Mcgill curl up 2X 10 hold 5secs   Bridge X 20 hold 10 secs    Blank cell = exercise not performed today   ASSESSMENT:  CLINICAL IMPRESSION: Patient presented in clinic with reports of no LBP upon arrival. Intermittent radicular pain reported by patient with activity but progressed through more stabilization and core centered  strengthening exercises. Patient had no complaints during therex. Patient to focus on HEP this coming weeks. Patient instructed in POE to control radicular symptoms. No increased symptoms after end of session.  OBJECTIVE IMPAIRMENTS: decreased activity tolerance, postural dysfunction, and pain.   ACTIVITY LIMITATIONS: lifting and bending  PERSONAL FACTORS: 1 comorbidity: 2 previous lumbar surgeries  are also affecting patient's functional outcome.   REHAB POTENTIAL: Excellent  CLINICAL DECISION MAKING: Stable/uncomplicated  EVALUATION COMPLEXITY: Low  GOALS:  SHORT TERM GOALS: Target date: 11/16/22  Ind with an HEP. Baseline: Goal status: INITIAL  LONG TERM GOALS: Target date: 12/07/22  Eliminate left LE symptoms. Baseline:  Goal status: INITIAL  2.  Perform ADL's with pain not > 2-3/10. Baseline:  Goal status: INITIAL  PLAN:  PT FREQUENCY: 2x/week  PT DURATION: other: 5 weeks.  PLANNED INTERVENTIONS: Therapeutic exercises, Therapeutic activity, Patient/Family education, Self Care, Dry Needling, Electrical stimulation, Cryotherapy, Moist heat, Ultrasound, and Manual therapy.  PLAN FOR NEXT SESSION: Modalities and STW/M as needed.  Core exercise progression.  S and DKTC, hip bridges.  Marvell Fuller, PTA 11/13/2022, 9:42 AM

## 2022-11-16 ENCOUNTER — Encounter: Payer: Medicare Other | Admitting: *Deleted

## 2022-11-16 DIAGNOSIS — Z006 Encounter for examination for normal comparison and control in clinical research program: Secondary | ICD-10-CM

## 2022-11-16 MED ORDER — STUDY - ORION 4 - INCLISIRAN 300 MG/1.5 ML OR PLACEBO SQ INJECTION (PI-STUCKEY)
300.0000 mg | INJECTION | SUBCUTANEOUS | Status: DC
  Filled 2022-11-16: qty 1.5

## 2022-11-16 NOTE — Research (Signed)
[facility]   Patient doing well no complaints of chest pains or shortness of breath. No AE/SAE to report.  No labs today  Injection given: left abd @ 0912  Will see patient back in [facility]  June 3rd at 830

## 2022-11-17 ENCOUNTER — Encounter: Payer: Self-pay | Admitting: Physical Therapy

## 2022-11-17 ENCOUNTER — Ambulatory Visit: Payer: Medicare Other | Admitting: Physical Therapy

## 2022-11-17 DIAGNOSIS — M5459 Other low back pain: Secondary | ICD-10-CM | POA: Diagnosis not present

## 2022-11-17 NOTE — Therapy (Signed)
OUTPATIENT PHYSICAL THERAPY THORACOLUMBAR TREATMENT   Patient Name: Louis Perez MRN: [id number] DOB:1951-07-26, 71 y.o., male Today's Date: 11/17/2022  END OF SESSION:  PT End of Session - 11/17/22 0817     Visit Number 4    Number of Visits 10    Date for PT Re-Evaluation 01/31/23    Authorization Type FOTO AT LEAST EVERY 5TH VISIT.  PROGRESS NOTE AT 10TH VISIT.  KX MODIFIER AFTER 15 VISITS.    PT Start Time [phone]    PT Stop Time 0857    PT Time Calculation (min) 41 min    Activity Tolerance Patient tolerated treatment well    Behavior During Therapy WFL for tasks assessed/performed            Past Medical History:  Diagnosis Date   Arthritis    "hips, lower back, knees, shoulders, hands" (05/14/2016)   Asthma dx'd in 2014   Bacterial septicemia (HCC) 01/2011   "both knee involved"   CAD (coronary artery disease)    a. NSTEMI 02/2016 s/p DES to LAD. b. Relook cath due to angina/prior high risk anatomy 05/14/16 -> continued medical therapy given concern that a stent strut could inferere with Cx itself.   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    NSTEMI (non-ST elevated myocardial infarction) (HCC) 02/2016   [name] 03/02/2016   Recurrent upper respiratory infection (URI)    Sinus bradycardia    Unspecified tinnitus    right   Past Surgical History:  Procedure Laterality Date   BACK SURGERY     CARDIAC CATHETERIZATION N/A 03/01/2016   Procedure: Left Heart Cath and Coronary Angiography;  Surgeon: Tonny Bollman, MD;  oLAD 95%, CFX/RI/RCA/RPDA 25-30%   CARDIAC CATHETERIZATION N/A 03/01/2016   Procedure: Coronary Stent Intervention;  Surgeon: Tonny Bollman, MD;  Promus Premier 4.0 x  12 mm DES to oLAD   CARDIAC CATHETERIZATION N/A 05/14/2016   Procedure: Left Heart Cath and Coronary Angiography;  Surgeon: Lennette Bihari, MD;  Location: [facility] INVASIVE CV LAB;  Service:  Cardiovascular;  Laterality: N/A;   CARDIAC CATHETERIZATION N/A 06/17/2016   Procedure: Intravascular Pressure Wire/FFR Study;  Surgeon: Tonny Bollman, MD;  Location[facility] INVASIVE CV LAB;  Service: Cardiovascular;  Laterality: N/A;   CARDIOVASCULAR STRESS TEST  05/26/2012   EKG negative for ischemia, no ECG changes, no significant ischemia noted   I & D KNEE WITH POLY EXCHANGE Bilateral 01/2011   & complete synovectomy of 3 compartments of the right total knee/notes 02/04/2011 (replacement,partial knee replacement because of bacterial infection)   JOINT REPLACEMENT     KNEE ARTHROSCOPY Right ~ 2002   LUMBAR DISC SURGERY  ~ 1980; 2000   NASAL SINUS SURGERY  08/2011   SHOULDER ARTHROSCOPY W/ ROTATOR CUFF REPAIR Right 01/2015   TOTAL KNEE ARTHROPLASTY Bilateral 03/26/2004-09/15/2004    right-left   TRANSTHORACIC ECHOCARDIOGRAM  05/26/2012   EF >55%, mild concentric LVH   Patient Active Problem List   Diagnosis Date Noted   Constipation 06/29/2022   Other specified abnormal findings of blood chemistry 06/29/2022   Severe persistent asthma with acute exacerbation 11/25/2020   Shortness of breath 11/25/2020   Viral upper respiratory infection 11/25/2020   Asthma, severe persistent, well-controlled 11/09/2018   Other allergic rhinitis 11/09/2018   LPRD (laryngopharyngeal reflux disease) 11/09/2018   Stomatitis and mucositis 11/09/2018   Tinnitus of both ears 09/05/2018  Visual disturbance 09/01/2018   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   Sensorineural hearing loss (SNHL) of right ear 07/11/2018   Pansinusitis 01/06/2017   Hyperlipidemia LDL goal <70 07/23/2016   Anginal chest pain at rest 06/17/2016   CAD in native artery 06/15/2016   Stented coronary artery 06/15/2016   Pain in the chest 05/28/2016   Sinus bradycardia 05/15/2016   Unstable angina (HCC) 05/14/2016   CAD (coronary artery disease), native coronary artery 05/14/2016   Adjustment insomnia 02/28/2016   Gilbert's syndrome  02/28/2016   Hematospermia 02/28/2016   Relative erythrocytosis 02/28/2016   Dyslipidemia 09/28/2013   GERD (gastroesophageal reflux disease) 09/28/2013   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  Rationale for Evaluation and Treatment: Rehabilitation  THERAPY DIAG:  Other low back pain  ONSET DATE: Ongoing but flare-up about 2 months ago.  SUBJECTIVE:                                                                                                                                                                                           SUBJECTIVE STATEMENT: Minimal pain after last treatment but feels his hips are getting better.  PERTINENT HISTORY:  HTN, coronary stent, CAD, bilateral TKA's, 2 lumbar surgeries, h/o bilateral shoulder pain.  PAIN: None  PRECAUTIONS: None  PATIENT GOALS: Return to pre-flare-up/exacerbation status.  NEXT MD VISIT: Posey Rea  OBJECTIVE:   TODAY'S TREATMENT:                                                                                                                              DATE: 11-17-22                                    EXERCISE LOG  Exercise Repetitions and Resistance Comments  TM 3 MPH x 14 mins focus on posture   Opp arm/leg raise at sink X15  reps   Marching 20 reps 5 sec holds L hip pain with LLE elevation  POE X2 min   Mcgill curl up 2X 10 hold 5secs   Bridge X 20 hold 5 secs   SL hip abduction BLE x20 reps 3 sec holds   SL clamshell BLE x20 reps 5 sec holds    Blank cell = exercise not performed today   ASSESSMENT:  CLINICAL IMPRESSION: Patient presented in clinic with reports of no LBP upon arrival. Patient progressed through more stabilization and hip strengthening as patient reports hip discomfort. Patient able to complete therex with only hip  muscle fatigue and minimal discomfort reported. No increased radicular symptoms with POE. Patient states that upon leaving PT he was going to local gym to do UE workout and his walking regimen.  OBJECTIVE IMPAIRMENTS: decreased activity tolerance, postural dysfunction, and pain.   ACTIVITY LIMITATIONS: lifting and bending  PERSONAL FACTORS: 1 comorbidity: 2 previous lumbar surgeries  are also affecting patient's functional outcome.   REHAB POTENTIAL: Excellent  CLINICAL DECISION MAKING: Stable/uncomplicated  EVALUATION COMPLEXITY: Low  GOALS:  SHORT TERM GOALS: Target date: 11/16/22  Ind with an HEP. Baseline: Goal status: INITIAL  LONG TERM GOALS: Target date: 12/07/22  Eliminate left LE symptoms. Baseline:  Goal status: INITIAL  2.  Perform ADL's with pain not > 2-3/10. Baseline:  Goal status: INITIAL  PLAN:  PT FREQUENCY: 2x/week  PT DURATION: other: 5 weeks.  PLANNED INTERVENTIONS: Therapeutic exercises, Therapeutic activity, Patient/Family education, Self Care, Dry Needling, Electrical stimulation, Cryotherapy, Moist heat, Ultrasound, and Manual therapy.  PLAN FOR NEXT SESSION: Modalities and STW/M as needed.  Core exercise progression.  S and DKTC, hip bridges.  Marvell Fuller, PTA 11/17/2022, 9:39 AM

## 2022-11-19 ENCOUNTER — Encounter: Payer: Self-pay | Admitting: Physical Therapy

## 2022-11-19 ENCOUNTER — Ambulatory Visit: Payer: Medicare Other | Admitting: Physical Therapy

## 2022-11-19 DIAGNOSIS — M5459 Other low back pain: Secondary | ICD-10-CM | POA: Diagnosis not present

## 2022-11-19 NOTE — Therapy (Addendum)
OUTPATIENT PHYSICAL THERAPY THORACOLUMBAR TREATMENT   Patient Name: Louis Perez MRN: [id number] DOB:Mar 07, 1951, 71 y.o., male Today's Date: 11/19/2022  END OF SESSION:  PT End of Session - 11/19/22 0828     Visit Number 5    Number of Visits 10    Date for PT Re-Evaluation 01/31/23    Authorization Type FOTO AT LEAST EVERY 5TH VISIT.  PROGRESS NOTE AT 10TH VISIT.  KX MODIFIER AFTER 15 VISITS.    PT Start Time [phone]    PT Stop Time 0859    PT Time Calculation (min) 42 min    Activity Tolerance Patient tolerated treatment well    Behavior During Therapy WFL for tasks assessed/performed            Past Medical History:  Diagnosis Date   Arthritis    "hips, lower back, knees, shoulders, hands" (05/14/2016)   Asthma dx'd in 2014   Bacterial septicemia (HCC) 01/2011   "both knee involved"   CAD (coronary artery disease)    a. NSTEMI 02/2016 s/p DES to LAD. b. Relook cath due to angina/prior high risk anatomy 05/14/16 -> continued medical therapy given concern that a stent strut could inferere with Cx itself.   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    NSTEMI (non-ST elevated myocardial infarction) (HCC) 02/2016   [name] 03/02/2016   Recurrent upper respiratory infection (URI)    Sinus bradycardia    Unspecified tinnitus    right   Past Surgical History:  Procedure Laterality Date   BACK SURGERY     CARDIAC CATHETERIZATION N/A 03/01/2016   Procedure: Left Heart Cath and Coronary Angiography;  Surgeon: Tonny Bollman, MD;  oLAD 95%, CFX/RI/RCA/RPDA 25-30%   CARDIAC CATHETERIZATION N/A 03/01/2016   Procedure: Coronary Stent Intervention;  Surgeon: Tonny Bollman, MD;  Promus Premier 4.0 x  12 mm DES to oLAD   CARDIAC CATHETERIZATION N/A 05/14/2016   Procedure: Left Heart Cath and Coronary Angiography;  Surgeon: Lennette Bihari, MD;  Location: [facility] INVASIVE CV LAB;  Service:  Cardiovascular;  Laterality: N/A;   CARDIAC CATHETERIZATION N/A 06/17/2016   Procedure: Intravascular Pressure Wire/FFR Study;  Surgeon: Tonny Bollman, MD;  Location: [facility] INVASIVE CV LAB;  Service: Cardiovascular;  Laterality: N/A;   CARDIOVASCULAR STRESS TEST  05/26/2012   EKG negative for ischemia, no ECG changes, no significant ischemia noted   I & D KNEE WITH POLY EXCHANGE Bilateral 01/2011   & complete synovectomy of 3 compartments of the right total knee/notes 02/04/2011 (replacement,partial knee replacement because of bacterial infection)   JOINT REPLACEMENT     KNEE ARTHROSCOPY Right ~ 2002   LUMBAR DISC SURGERY  ~ 1980; 2000   NASAL SINUS SURGERY  08/2011   SHOULDER ARTHROSCOPY W/ ROTATOR CUFF REPAIR Right 01/2015   TOTAL KNEE ARTHROPLASTY Bilateral 03/26/2004-09/15/2004    right-left   TRANSTHORACIC ECHOCARDIOGRAM  05/26/2012   EF >55%, mild concentric LVH   Patient Active Problem List   Diagnosis Date Noted   Constipation 06/29/2022   Other specified abnormal findings of blood chemistry 06/29/2022   Severe persistent asthma with acute exacerbation 11/25/2020   Shortness of breath 11/25/2020   Viral upper respiratory infection 11/25/2020   Asthma, severe persistent, well-controlled 11/09/2018   Other allergic rhinitis 11/09/2018   LPRD (laryngopharyngeal reflux disease) 11/09/2018   Stomatitis and mucositis 11/09/2018   Tinnitus of both ears 09/05/2018  Visual disturbance 09/01/2018   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   Sensorineural hearing loss (SNHL) of right ear 07/11/2018   Pansinusitis 01/06/2017   Hyperlipidemia LDL goal <70 07/23/2016   Anginal chest pain at rest 06/17/2016   CAD in native artery 06/15/2016   Stented coronary artery 06/15/2016   Pain in the chest 05/28/2016   Sinus bradycardia 05/15/2016   Unstable angina (HCC) 05/14/2016   CAD (coronary artery disease), native coronary artery 05/14/2016   Adjustment insomnia 02/28/2016   Gilbert's syndrome  02/28/2016   Hematospermia 02/28/2016   Relative erythrocytosis 02/28/2016   Dyslipidemia 09/28/2013   GERD (gastroesophageal reflux disease) 09/28/2013   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  Rationale for Evaluation and Treatment: Rehabilitation  THERAPY DIAG:  Other low back pain  ONSET DATE: Ongoing but flare-up about 2 months ago.  SUBJECTIVE:                                                                                                                                                                                           SUBJECTIVE STATEMENT: Has already been to upper body workout at the gym this morning. Reports that hips aren't too bad and got up leaves for several hours yesterday.  PERTINENT HISTORY:  HTN, coronary stent, CAD, bilateral TKA's, 2 lumbar surgeries, h/o bilateral shoulder pain.  PAIN: None  PRECAUTIONS: None  PATIENT GOALS: Return to pre-flare-up/exacerbation status.  NEXT MD VISIT: Posey Rea  OBJECTIVE:   TODAY'S TREATMENT:                                                                                                                              DATE: 11-19-22                                    EXERCISE LOG  Exercise Repetitions and Resistance Comments  TM 3 MPH  x 13 mins focus on posture   Lumbar extension 60-80# x20 reps   Arm raise at plinth With bolster x10 reps 3 sec holds   Leg raise at plinth With bolsteer x15 reps 3 sec holds   Opp arm/leg raise at sink X15 reps   Seated marching 6# ball x15 reps   POE X2 min   Mcgill curl up 2X 10 hold 5secs   Bridge X 30 hold 5 secs with airex pad   SL clamshell BLE x20 reps 5 sec holds    Blank cell = exercise not performed today   ASSESSMENT:  CLINICAL IMPRESSION: Patient presented in clinic with reports of  minimal hip discomfort but no LBP. Patient progressed to more lumbar strengthening with progression to seated exercises for stabilization. Has yardwork to be done today which has caused mild hip discomfort. No complaints by end of PT session.  OBJECTIVE IMPAIRMENTS: decreased activity tolerance, postural dysfunction, and pain.   ACTIVITY LIMITATIONS: lifting and bending  PERSONAL FACTORS: 1 comorbidity: 2 previous lumbar surgeries  are also affecting patient's functional outcome.   REHAB POTENTIAL: Excellent  CLINICAL DECISION MAKING: Stable/uncomplicated  EVALUATION COMPLEXITY: Low  GOALS:  SHORT TERM GOALS: Target date: 11/16/22  Ind with an HEP. Baseline: Goal status: INITIAL  LONG TERM GOALS: Target date: 12/07/22  Eliminate left LE symptoms. Baseline:  Goal status: INITIAL  2.  Perform ADL's with pain not > 2-3/10. Baseline:  Goal status: INITIAL  PLAN:  PT FREQUENCY: 2x/week  PT DURATION: other: 5 weeks.  PLANNED INTERVENTIONS: Therapeutic exercises, Therapeutic activity, Patient/Family education, Self Care, Dry Needling, Electrical stimulation, Cryotherapy, Moist heat, Ultrasound, and Manual therapy.  PLAN FOR NEXT SESSION: Modalities and STW/M as needed.  Core exercise progression.  S and DKTC, hip bridges.  Marvell Fuller, PTA 11/19/2022, 9:40 AM

## 2022-11-24 ENCOUNTER — Encounter: Payer: Self-pay | Admitting: Physical Therapy

## 2022-11-24 ENCOUNTER — Ambulatory Visit: Payer: Medicare Other | Admitting: Physical Therapy

## 2022-11-24 DIAGNOSIS — M5459 Other low back pain: Secondary | ICD-10-CM | POA: Diagnosis not present

## 2022-11-24 NOTE — Therapy (Signed)
OUTPATIENT PHYSICAL THERAPY THORACOLUMBAR TREATMENT   Patient Name: Louis Perez MRN: [id number] DOB:Sep 11, 1951, 71 y.o., male Today's Date: 11/24/2022  END OF SESSION:  PT End of Session - 11/24/22 0821     Visit Number 6    Number of Visits 10    Date for PT Re-Evaluation 01/31/23    Authorization Type FOTO AT LEAST EVERY 5TH VISIT.  PROGRESS NOTE AT 10TH VISIT.  KX MODIFIER AFTER 15 VISITS.    PT Start Time [phone]    PT Stop Time 0859    PT Time Calculation (min) 43 min    Activity Tolerance Patient tolerated treatment well    Behavior During Therapy WFL for tasks assessed/performed            Past Medical History:  Diagnosis Date   Arthritis    "hips, lower back, knees, shoulders, hands" (05/14/2016)   Asthma dx'd in 2014   Bacterial septicemia (HCC) 01/2011   "both knee involved"   CAD (coronary artery disease)    a. NSTEMI 02/2016 s/p DES to LAD. b. Relook cath due to angina/prior high risk anatomy 05/14/16 -> continued medical therapy given concern that a stent strut could inferere with Cx itself.   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    NSTEMI (non-ST elevated myocardial infarction) (HCC) 02/2016   [name] 03/02/2016   Recurrent upper respiratory infection (URI)    Sinus bradycardia    Unspecified tinnitus    right   Past Surgical History:  Procedure Laterality Date   BACK SURGERY     CARDIAC CATHETERIZATION N/A 03/01/2016   Procedure: Left Heart Cath and Coronary Angiography;  Surgeon: Tonny Bollman, MD;  oLAD 95%, CFX/RI/RCA/RPDA 25-30%   CARDIAC CATHETERIZATION N/A 03/01/2016   Procedure: Coronary Stent Intervention;  Surgeon: Tonny Bollman, MD;  Promus Premier 4.0 x  12 mm DES to oLAD   CARDIAC CATHETERIZATION N/A 05/14/2016   Procedure: Left Heart Cath and Coronary Angiography;  Surgeon: Lennette Bihari, MD;  Location: [facility] INVASIVE CV LAB;  Service:  Cardiovascular;  Laterality: N/A;   CARDIAC CATHETERIZATION N/A 06/17/2016   Procedure: Intravascular Pressure Wire/FFR Study;  Surgeon: Tonny Bollman, MD;  Location: [facility] INVASIVE CV LAB;  Service: Cardiovascular;  Laterality: N/A;   CARDIOVASCULAR STRESS TEST  05/26/2012   EKG negative for ischemia, no ECG changes, no significant ischemia noted   I & D KNEE WITH POLY EXCHANGE Bilateral 01/2011   & complete synovectomy of 3 compartments of the right total knee/notes 02/04/2011 (replacement,partial knee replacement because of bacterial infection)   JOINT REPLACEMENT     KNEE ARTHROSCOPY Right ~ 2002   LUMBAR DISC SURGERY  ~ 1980; 2000   NASAL SINUS SURGERY  08/2011   SHOULDER ARTHROSCOPY W/ ROTATOR CUFF REPAIR Right 01/2015   TOTAL KNEE ARTHROPLASTY Bilateral 03/26/2004-09/15/2004    right-left   TRANSTHORACIC ECHOCARDIOGRAM  05/26/2012   EF >55%, mild concentric LVH   Patient Active Problem List   Diagnosis Date Noted   Constipation 06/29/2022   Other specified abnormal findings of blood chemistry 06/29/2022   Severe persistent asthma with acute exacerbation 11/25/2020   Shortness of breath 11/25/2020   Viral upper respiratory infection 11/25/2020   Asthma, severe persistent, well-controlled 11/09/2018   Other allergic rhinitis 11/09/2018   LPRD (laryngopharyngeal reflux disease) 11/09/2018   Stomatitis and mucositis 11/09/2018   Tinnitus of both ears 09/05/2018  Visual disturbance 09/01/2018   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   Sensorineural hearing loss (SNHL) of right ear 07/11/2018   Pansinusitis 01/06/2017   Hyperlipidemia LDL goal <70 07/23/2016   Anginal chest pain at rest 06/17/2016   CAD in native artery 06/15/2016   Stented coronary artery 06/15/2016   Pain in the chest 05/28/2016   Sinus bradycardia 05/15/2016   Unstable angina (HCC) 05/14/2016   CAD (coronary artery disease), native coronary artery 05/14/2016   Adjustment insomnia 02/28/2016   Gilbert's syndrome  02/28/2016   Hematospermia 02/28/2016   Relative erythrocytosis 02/28/2016   Dyslipidemia 09/28/2013   GERD (gastroesophageal reflux disease) 09/28/2013   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  Rationale for Evaluation and Treatment: Rehabilitation  THERAPY DIAG:  Other low back pain  ONSET DATE: Ongoing but flare-up about 2 months ago.  SUBJECTIVE:                                                                                                                                                                                           SUBJECTIVE STATEMENT: Reports when he woke up initially he had R sided LBP which was more of a muscle feel. Denied any heavy lifting yesterday. Did small activities yesterday but did lift weights in sitting.  PERTINENT HISTORY:  HTN, coronary stent, CAD, bilateral TKA's, 2 lumbar surgeries, h/o bilateral shoulder pain.  PAIN: None  PRECAUTIONS: None  PATIENT GOALS: Return to pre-flare-up/exacerbation status.  NEXT MD VISIT: Posey Rea  OBJECTIVE:   TODAY'S TREATMENT:                                                                                                                              DATE: 11-24-22                                    EXERCISE LOG  Exercise Repetitions  and Resistance Comments  TM 3 MPH x 15 mins focus on posture   Lumbar extension  80# x20 reps   SKTC BLE 4x30 sec   Figure 4 stretch BLE 4x30 sec   LTR X10 reps 5 sec   Bridge X 20 hold 5 secs with airex pad   Bridge with march X10 reps     Blank cell = exercise not performed today   ASSESSMENT:  CLINICAL IMPRESSION: Patient arrived in clinic with complaint of tightness in R low back upon waking. No pain during PT but primary focus was on stretching lumbar and hips. No complaints with any  therex completed today. Minimal progression of bridging completed in order to monitor symptoms. Patient remains consistent with gym training of UE in sitting at the gym but denies any strenuous exercise or activity yesterday to elicit tightness. No complaints following end of treatment.  OBJECTIVE IMPAIRMENTS: decreased activity tolerance, postural dysfunction, and pain.   ACTIVITY LIMITATIONS: lifting and bending  PERSONAL FACTORS: 1 comorbidity: 2 previous lumbar surgeries  are also affecting patient's functional outcome.   REHAB POTENTIAL: Excellent  CLINICAL DECISION MAKING: Stable/uncomplicated  EVALUATION COMPLEXITY: Low  GOALS:  SHORT TERM GOALS: Target date: 11/16/22  Ind with an HEP. Baseline: Goal status: INITIAL  LONG TERM GOALS: Target date: 12/07/22  Eliminate left LE symptoms. Baseline:  Goal status: INITIAL  2.  Perform ADL's with pain not > 2-3/10. Baseline:  Goal status: INITIAL  PLAN:  PT FREQUENCY: 2x/week  PT DURATION: other: 5 weeks.  PLANNED INTERVENTIONS: Therapeutic exercises, Therapeutic activity, Patient/Family education, Self Care, Dry Needling, Electrical stimulation, Cryotherapy, Moist heat, Ultrasound, and Manual therapy.  PLAN FOR NEXT SESSION: Modalities and STW/M as needed.  Core exercise progression.  S and DKTC, hip bridges.  Marvell Fuller, PTA 11/24/2022, 9:05 AM

## 2022-11-26 ENCOUNTER — Encounter: Payer: Self-pay | Admitting: Physical Therapy

## 2022-11-26 ENCOUNTER — Ambulatory Visit: Payer: Medicare Other | Admitting: Physical Therapy

## 2022-11-26 DIAGNOSIS — M5459 Other low back pain: Secondary | ICD-10-CM

## 2022-11-26 NOTE — Therapy (Signed)
OUTPATIENT PHYSICAL THERAPY THORACOLUMBAR TREATMENT   Patient Name: Louis Perez MRN: [id number] DOB:08-Sep-1951, 71 y.o., male Today's Date: 11/26/2022  END OF SESSION:  PT End of Session - 11/26/22 0832     Visit Number 7    Number of Visits 10    Date for PT Re-Evaluation 01/31/23    Authorization Type FOTO AT LEAST EVERY 5TH VISIT.  PROGRESS NOTE AT 10TH VISIT.  KX MODIFIER AFTER 15 VISITS.    PT Start Time [phone]    PT Stop Time 0900    PT Time Calculation (min) 43 min    Activity Tolerance Patient tolerated treatment well    Behavior During Therapy WFL for tasks assessed/performed            Past Medical History:  Diagnosis Date   Arthritis    "hips, lower back, knees, shoulders, hands" (05/14/2016)   Asthma dx'd in 2014   Bacterial septicemia (HCC) 01/2011   "both knee involved"   CAD (coronary artery disease)    a. NSTEMI 02/2016 s/p DES to LAD. b. Relook cath due to angina/prior high risk anatomy 05/14/16 -> continued medical therapy given concern that a stent strut could inferere with Cx itself.   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    NSTEMI (non-ST elevated myocardial infarction) (HCC) 02/2016   [name] 03/02/2016   Recurrent upper respiratory infection (URI)    Sinus bradycardia    Unspecified tinnitus    right   Past Surgical History:  Procedure Laterality Date   BACK SURGERY     CARDIAC CATHETERIZATION N/A 03/01/2016   Procedure: Left Heart Cath and Coronary Angiography;  Surgeon: Tonny Bollman, MD;  oLAD 95%, CFX/RI/RCA/RPDA 25-30%   CARDIAC CATHETERIZATION N/A 03/01/2016   Procedure: Coronary Stent Intervention;  Surgeon: Tonny Bollman, MD;  Promus Premier 4.0 x  12 mm DES to oLAD   CARDIAC CATHETERIZATION N/A 05/14/2016   Procedure: Left Heart Cath and Coronary Angiography;  Surgeon: Lennette Bihari, MD;  Location: [facility] INVASIVE CV LAB;  Service:  Cardiovascular;  Laterality: N/A;   CARDIAC CATHETERIZATION N/A 06/17/2016   Procedure: Intravascular Pressure Wire/FFR Study;  Surgeon: Tonny Bollman, MD;  Location: [facility] INVASIVE CV LAB;  Service: Cardiovascular;  Laterality: N/A;   CARDIOVASCULAR STRESS TEST  05/26/2012   EKG negative for ischemia, no ECG changes, no significant ischemia noted   I & D KNEE WITH POLY EXCHANGE Bilateral 01/2011   & complete synovectomy of 3 compartments of the right total knee/notes 02/04/2011 (replacement,partial knee replacement because of bacterial infection)   JOINT REPLACEMENT     KNEE ARTHROSCOPY Right ~ 2002   LUMBAR DISC SURGERY  ~ 1980; 2000   NASAL SINUS SURGERY  08/2011   SHOULDER ARTHROSCOPY W/ ROTATOR CUFF REPAIR Right 01/2015   TOTAL KNEE ARTHROPLASTY Bilateral 03/26/2004-09/15/2004    right-left   TRANSTHORACIC ECHOCARDIOGRAM  05/26/2012   EF >55%, mild concentric LVH   Patient Active Problem List   Diagnosis Date Noted   Constipation 06/29/2022   Other specified abnormal findings of blood chemistry 06/29/2022   Severe persistent asthma with acute exacerbation 11/25/2020   Shortness of breath 11/25/2020   Viral upper respiratory infection 11/25/2020   Asthma, severe persistent, well-controlled 11/09/2018   Other allergic rhinitis 11/09/2018   LPRD (laryngopharyngeal reflux disease) 11/09/2018   Stomatitis and mucositis 11/09/2018   Tinnitus of both ears 09/05/2018  Visual disturbance 09/01/2018   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   Sensorineural hearing loss (SNHL) of right ear 07/11/2018   Pansinusitis 01/06/2017   Hyperlipidemia LDL goal <70 07/23/2016   Anginal chest pain at rest 06/17/2016   CAD in native artery 06/15/2016   Stented coronary artery 06/15/2016   Pain in the chest 05/28/2016   Sinus bradycardia 05/15/2016   Unstable angina ([place]) 05/14/2016   CAD (coronary artery disease), native coronary artery 05/14/2016   Adjustment insomnia 02/28/2016   Gilbert's syndrome  02/28/2016   Hematospermia 02/28/2016   Relative erythrocytosis 02/28/2016   Dyslipidemia 09/28/2013   GERD (gastroesophageal reflux disease) 09/28/2013   Oscillopsia 07/07/2013   Moderate persistent chronic asthma with acute exacerbation 06/05/2013   Essential hypertension 06/05/2013   REFERRING PROVIDER: Laroy Apple MD  REFERRING DIAG: Left lumbar radicular pain.  Rationale for Evaluation and Treatment: Rehabilitation  THERAPY DIAG:  Other low back pain  ONSET DATE: Ongoing but flare-up about 2 months ago.  SUBJECTIVE:                                                                                                                                                                                           SUBJECTIVE STATEMENT: Reports that he has mild pain but had to lift the tailgate on a trailer as the assist was broken.  PERTINENT HISTORY:  HTN, coronary stent, CAD, bilateral TKA's, 2 lumbar surgeries, h/o bilateral shoulder pain.  PAIN:  Are you having pain? Yes: NPRS scale: 2/10 Pain location: lumbar Pain description: discomfort Aggravating factors: lifting Relieving factors:      PRECAUTIONS: None  PATIENT GOALS: Return to pre-flare-up/exacerbation status.  NEXT MD VISIT: Marena Chancy  OBJECTIVE:   TODAY'S TREATMENT:                                                                                                                              DATE: 11-26-22  EXERCISE LOG  Exercise Repetitions and Resistance Comments  TM 3 MPH x 18 mins focus on posture   Lumbar extension  80# x20 reps   Lat pulldown With core blue XTS x20 reps   Chop wood Blue XTS with core x20 reps each Monitored for shoulder pain  LTR X15 reps 5 sec   Bridge X 20 hold 5 secs with green theraband   March X20 reps green theraband    Blank cell = exercise not performed today   ASSESSMENT:  CLINICAL IMPRESSION: Patient arrived after lifting a tailgate on a  trailer yesterday but could not avoid it. Has some R LBP upon waking with minimal rating upon arrival. Patient progressed to more active core strengthening with activity. Monitored due to shoulder pain reported. Resistance added for supine exercises as well with holds. No complaints following end of treatment but patient is skipping the gym today to relax more.  OBJECTIVE IMPAIRMENTS: decreased activity tolerance, postural dysfunction, and pain.   ACTIVITY LIMITATIONS: lifting and bending  PERSONAL FACTORS: 1 comorbidity: 2 previous lumbar surgeries  are also affecting patient's functional outcome.   REHAB POTENTIAL: Excellent  CLINICAL DECISION MAKING: Stable/uncomplicated  EVALUATION COMPLEXITY: Low  GOALS:  SHORT TERM GOALS: Target date: 11/16/22  Ind with an HEP. Baseline: Goal status: INITIAL  LONG TERM GOALS: Target date: 12/07/22  Eliminate left LE symptoms. Baseline:  Goal status: INITIAL  2.  Perform ADL's with pain not > 2-3/10. Baseline:  Goal status: INITIAL  PLAN:  PT FREQUENCY: 2x/week  PT DURATION: other: 5 weeks.  PLANNED INTERVENTIONS: Therapeutic exercises, Therapeutic activity, Patient/Family education, Self Care, Dry Needling, Electrical stimulation, Cryotherapy, Moist heat, Ultrasound, and Manual therapy.  PLAN FOR NEXT SESSION: Modalities and STW/M as needed.  Core exercise progression.  S and DKTC, hip bridges.  Standley Brooking, PTA 11/26/2022, 10:46 AM

## 2022-12-01 ENCOUNTER — Encounter: Payer: Self-pay | Admitting: Physical Therapy

## 2022-12-01 ENCOUNTER — Ambulatory Visit: Payer: Medicare Other | Admitting: Physical Therapy

## 2022-12-01 DIAGNOSIS — M5459 Other low back pain: Secondary | ICD-10-CM | POA: Diagnosis not present

## 2022-12-01 NOTE — Therapy (Signed)
OUTPATIENT PHYSICAL THERAPY THORACOLUMBAR TREATMENT   Patient Name: Louis Perez MRN: [id number] DOB:[birth year], 71 y.o., male Today's Date: 12/01/2022  END OF SESSION:  PT End of Session - 12/01/22 0836     Visit Number 8    Number of Visits 10    Date for PT Re-Evaluation 01/31/23    Authorization Type FOTO AT LEAST EVERY 5TH VISIT.  PROGRESS NOTE AT 10TH VISIT.  KX MODIFIER AFTER 15 VISITS.    PT Start Time [phone]    PT Stop Time 0900    PT Time Calculation (min) 43 min    Activity Tolerance Patient tolerated treatment well    Behavior During Therapy WFL for tasks assessed/performed            Past Medical History:  Diagnosis Date   Arthritis    "hips, lower back, knees, shoulders, hands" (05/14/2016)   Asthma dx'd in 2014   Bacterial septicemia (HCC) 01/2011   "both knee involved"   CAD (coronary artery disease)    a. NSTEMI 02/2016 s/p DES to LAD. b. Relook cath due to angina/prior high risk anatomy 05/14/16 -> continued medical therapy given concern that a stent strut could inferere with Cx itself.   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    NSTEMI (non-ST elevated myocardial infarction) (HCC) 02/2016   [name] 03/02/2016   Recurrent upper respiratory infection (URI)    Sinus bradycardia    Unspecified tinnitus    right   Past Surgical History:  Procedure Laterality Date   BACK SURGERY     CARDIAC CATHETERIZATION N/A 03/01/2016   Procedure: Left Heart Cath and Coronary Angiography;  Surgeon: Tonny Bollman, MD;  oLAD 95%, CFX/RI/RCA/RPDA 25-30%   CARDIAC CATHETERIZATION N/A 03/01/2016   Procedure: Coronary Stent Intervention;  Surgeon: Tonny Bollman, MD;  Promus Premier 4.0 x  12 mm DES to oLAD   CARDIAC CATHETERIZATION N/A 05/14/2016   Procedure: Left Heart Cath and Coronary Angiography;  Surgeon: Lennette Bihari, MD;  Location: [facility] INVASIVE CV LAB;  Service:  Cardiovascular;  Laterality: N/A;   CARDIAC CATHETERIZATION N/A 06/17/2016   Procedure: Intravascular Pressure Wire/FFR Study;  Surgeon: Tonny Bollman, MD;  Location: [facility] INVASIVE CV LAB;  Service: Cardiovascular;  Laterality: N/A;   CARDIOVASCULAR STRESS TEST  05/26/2012   EKG negative for ischemia, no ECG changes, no significant ischemia noted   I & D KNEE WITH POLY EXCHANGE Bilateral 01/2011   & complete synovectomy of 3 compartments of the right total knee/notes 02/04/2011 (replacement,partial knee replacement because of bacterial infection)   JOINT REPLACEMENT     KNEE ARTHROSCOPY Right ~ 2002   LUMBAR DISC SURGERY  ~ 1980; 2000   NASAL SINUS SURGERY  08/2011   SHOULDER ARTHROSCOPY W/ ROTATOR CUFF REPAIR Right 01/2015   TOTAL KNEE ARTHROPLASTY Bilateral 03/26/2004-09/15/2004    right-left   TRANSTHORACIC ECHOCARDIOGRAM  05/26/2012   EF >55%, mild concentric LVH   Patient Active Problem List   Diagnosis Date Noted   Constipation 06/29/2022   Other specified abnormal findings of blood chemistry 06/29/2022   Severe persistent asthma with acute exacerbation 11/25/2020   Shortness of breath 11/25/2020   Viral upper respiratory infection 11/25/2020   Asthma, severe persistent, well-controlled 11/09/2018   Other allergic rhinitis 11/09/2018   LPRD (laryngopharyngeal reflux disease) 11/09/2018   Stomatitis and mucositis 11/09/2018   Tinnitus of both ears 09/05/2018  Visual disturbance 09/01/2018   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   Sensorineural hearing loss (SNHL) of right ear 07/11/2018   Pansinusitis 01/06/2017   Hyperlipidemia LDL goal <70 07/23/2016   Anginal chest pain at rest 06/17/2016   CAD in native artery 06/15/2016   Stented coronary artery 06/15/2016   Pain in the chest 05/28/2016   Sinus bradycardia 05/15/2016   Unstable angina (HCC) 05/14/2016   CAD (coronary artery disease), native coronary artery 05/14/2016   Adjustment insomnia 02/28/2016   Gilbert's syndrome  02/28/2016   Hematospermia 02/28/2016   Relative erythrocytosis 02/28/2016   Dyslipidemia 09/28/2013   GERD (gastroesophageal reflux disease) 09/28/2013   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  Rationale for Evaluation and Treatment: Rehabilitation  THERAPY DIAG:  Other low back pain  ONSET DATE: Ongoing but flare-up about 2 months ago.  SUBJECTIVE:                                                                                                                                                                                           SUBJECTIVE STATEMENT: Reporting a little pain but nothing excessive. More in R hip.  PERTINENT HISTORY:  HTN, coronary stent, CAD, bilateral TKA's, 2 lumbar surgeries, h/o bilateral shoulder pain.  PAIN:  Are you having pain? Yes: NPRS scale: 2/10 Pain location: lumbar Pain description: discomfort Aggravating factors: lifting Relieving factors:      PRECAUTIONS: None  PATIENT GOALS: Return to pre-flare-up/exacerbation status.  NEXT MD VISIT: Posey Rea  OBJECTIVE:   TODAY'S TREATMENT:                                                                                                                              DATE: 12-01-22                                    EXERCISE LOG  Exercise  Repetitions and Resistance Comments  TM 3 MPH x 20 mins focus on posture   Seated Figure 4 stretch RLE 3x30 sec   Lumbar extension  80# x20 reps   Chop wood Blue XTS with core x20 reps each Monitored for shoulder pain  Hip extension Red theraband x20 reps BLE   Hip abduction Red theraband x20 reps BLE   Bridge X 20 hold 5 secs/ red theraband    Blank cell = exercise not performed today   ASSESSMENT:  CLINICAL IMPRESSION:  Patient presented in clinic with more R hip discomfort at  greater trochanter per indication. Patient guided through hip stretching and resisted strengthening. Patient now indicating the numbness of the L great toe has now receded so he knows he is improving. Patient stated that he may opt to not go to the gym today due to hip resistive training.  OBJECTIVE IMPAIRMENTS: decreased activity tolerance, postural dysfunction, and pain.   ACTIVITY LIMITATIONS: lifting and bending  PERSONAL FACTORS: 1 comorbidity: 2 previous lumbar surgeries  are also affecting patient's functional outcome.   REHAB POTENTIAL: Excellent  CLINICAL DECISION MAKING: Stable/uncomplicated  EVALUATION COMPLEXITY: Low  GOALS:  SHORT TERM GOALS: Target date: 11/16/22  Ind with an HEP. Baseline: Goal status: INITIAL  LONG TERM GOALS: Target date: 12/07/22  Eliminate left LE symptoms. Baseline:  Goal status: INITIAL  2.  Perform ADL's with pain not > 2-3/10. Baseline:  Goal status: INITIAL  PLAN:  PT FREQUENCY: 2x/week  PT DURATION: other: 5 weeks.  PLANNED INTERVENTIONS: Therapeutic exercises, Therapeutic activity, Patient/Family education, Self Care, Dry Needling, Electrical stimulation, Cryotherapy, Moist heat, Ultrasound, and Manual therapy.  PLAN FOR NEXT SESSION: Modalities and STW/M as needed.  Core exercise progression.  S and DKTC, hip bridges.  Marvell Fuller, PTA 12/01/2022, 9:56 AM

## 2022-12-08 ENCOUNTER — Encounter: Payer: Medicare Other | Admitting: Physical Therapy

## 2022-12-08 ENCOUNTER — Emergency Department (HOSPITAL_BASED_OUTPATIENT_CLINIC_OR_DEPARTMENT_OTHER): Payer: Medicare Other

## 2022-12-08 ENCOUNTER — Encounter (HOSPITAL_BASED_OUTPATIENT_CLINIC_OR_DEPARTMENT_OTHER): Payer: Self-pay | Admitting: Emergency Medicine

## 2022-12-08 ENCOUNTER — Emergency Department (HOSPITAL_BASED_OUTPATIENT_CLINIC_OR_DEPARTMENT_OTHER)
Admission: EM | Admit: 2022-12-08 | Discharge: 2022-12-08 | Disposition: A | Discharge status: Home / Self Care | Payer: Medicare Other | Attending: Emergency Medicine | Admitting: Emergency Medicine

## 2022-12-08 ENCOUNTER — Other Ambulatory Visit: Payer: Self-pay

## 2022-12-08 DIAGNOSIS — J45909 Unspecified asthma, uncomplicated: Secondary | ICD-10-CM | POA: Insufficient documentation

## 2022-12-08 DIAGNOSIS — Z7951 Long term (current) use of inhaled steroids: Secondary | ICD-10-CM | POA: Diagnosis not present

## 2022-12-08 DIAGNOSIS — Z7982 Long term (current) use of aspirin: Secondary | ICD-10-CM | POA: Insufficient documentation

## 2022-12-08 DIAGNOSIS — Z79899 Other long term (current) drug therapy: Secondary | ICD-10-CM | POA: Insufficient documentation

## 2022-12-08 DIAGNOSIS — U071 COVID-19: Secondary | ICD-10-CM | POA: Insufficient documentation

## 2022-12-08 DIAGNOSIS — M791 Myalgia, unspecified site: Secondary | ICD-10-CM | POA: Diagnosis present

## 2022-12-08 DIAGNOSIS — I2510 Atherosclerotic heart disease of native coronary artery without angina pectoris: Secondary | ICD-10-CM | POA: Insufficient documentation

## 2022-12-08 DIAGNOSIS — I10 Essential (primary) hypertension: Secondary | ICD-10-CM | POA: Insufficient documentation

## 2022-12-08 LAB — RESP PANEL BY RT-PCR (RSV, FLU A&B, COVID)  RVPGX2
Influenza A by PCR: NEGATIVE
Influenza B by PCR: NEGATIVE
Resp Syncytial Virus by PCR: NEGATIVE
SARS Coronavirus 2 by RT PCR: POSITIVE — AB

## 2022-12-08 MED ORDER — ACETAMINOPHEN 500 MG PO TABS
1000.0000 mg | ORAL_TABLET | Freq: Once | ORAL | Status: AC
  Administered 2022-12-08: 1000 mg via ORAL
  Filled 2022-12-08: qty 2

## 2022-12-08 MED ORDER — NIRMATRELVIR/RITONAVIR (PAXLOVID)TABLET: 3.0000 | ORAL_TABLET | Freq: Two times a day (BID) | ORAL | 0 refills | Status: AC

## 2022-12-08 NOTE — ED Provider Notes (Signed)
[facility] EMERGENCY DEPARTMENT Provider Note   CSN: [id number] Arrival date & time: 12/08/22  1243     History  Chief Complaint  Patient presents with   Covid +, Bodyaches    Louis Perez is a 71 y.o. male.  HPI 71 year old male presents with Covid 19.  He has a history of CAD, asthma, hypertension, hyperlipidemia.  He has been feeling poorly since at least yesterday though he thinks his symptoms might of started a day or 2 before that.  Primarily has bodyaches and sore throat.  Currently he is getting a little bit of a headache.  He has not felt short of breath and has not had a cough.  No fevers.  Took a home COVID test today but noted that the test itself was old but still turned out positive and came here for clarification/testing and potential treatment.  Home Medications Prior to Admission medications   Medication Sig Start Date End Date Taking? Authorizing Provider  Acetylcysteine (NAC 600 PO) Take 1 mg by mouth 2 (two) times daily. Patient not taking: Reported on 11/16/2022    [provider]  albuterol (PROVENTIL) (2.5 MG/3ML) 0.083% nebulizer solution Take 3 mLs (2.5 mg total) by nebulization every 4 (four) hours as needed for wheezing or shortness of breath. 09/22/22   Kozlow, Alvira Philips, MD  albuterol (VENTOLIN HFA) 108 (90 Base) MCG/ACT inhaler Inhale 2 puffs into the lungs every 4 (four) hours as needed for wheezing or shortness of breath. 09/22/22   Kozlow, Alvira Philips, MD  aspirin 81 MG tablet Take 81 mg by mouth at bedtime.     [provider]  Budeson-Glycopyrrol-Formoterol (BREZTRI AEROSPHERE) 160-9-4.8 MCG/ACT AERO Inhale 2 puffs into the lungs in the morning and at bedtime. 09/22/22   Kozlow, Alvira Philips, MD  budesonide (PULMICORT) 0.5 MG/2ML nebulizer solution For asthma flares-take 2 mLs via nebulizer at morning and bedtime for 2 weeks or until asthma symptoms resolve 09/22/22   Kozlow, Alvira Philips, MD  carvedilol (COREG) 6.25 MG tablet Take 1  tablet (6.25 mg total) by mouth 2 (two) times daily. 03/09/22   [name], Lisette Abu, MD  famotidine (PEPCID) 20 MG tablet Take 2 tablets (40 mg total) by mouth in the morning. Patient taking differently: Take 20 mg by mouth in the morning. 09/22/22   Kozlow, Alvira Philips, MD  hydrALAZINE (APRESOLINE) 10 MG tablet Take 1 tablet (10 mg total) by mouth as needed (for systolic blood pressure more than 160). 03/26/22   Gaston Islam., NP  ipratropium (ATROVENT) 0.06 % nasal spray Place 1 spray into the nose 4 (four) times daily as needed for rhinitis. 09/22/22   Kozlow, Alvira Philips, MD  isosorbide mononitrate (IMDUR) 60 MG 24 hr tablet Take 1 tablet (60 mg total) by mouth daily. Keep upcoming appointment for future refills. 02/25/22   [name], Lisette Abu, MD  levocetirizine (XYZAL) 5 MG tablet Take 1 tablet (5 mg total) by mouth 2 (two) times daily as needed (Can take an extra dose during flare ups.). 09/22/22   Kozlow, Alvira Philips, MD  Multiple Vitamin (MULTIVITAMIN WITH MINERALS) TABS tablet Take 1 tablet by mouth daily.    [provider]  naproxen sodium (ALEVE) 220 MG tablet Take 220 mg by mouth daily as needed.    [provider]  nitroGLYCERIN (NITROSTAT) 0.4 MG SL tablet Place 1 tablet (0.4 mg total) under the tongue every 5 (five) minutes as needed for chest pain. 04/21/21   [name],  Lisette Abu, MD  nystatin (MYCOSTATIN) 100000 UNIT/ML suspension Take 5 mLs (500,000 Units total) by mouth 4 (four) times daily. Swish, gargle, and swallow after Breztri use. 06/30/22   Kozlow, Alvira Philips, MD  olmesartan (BENICAR) 40 MG tablet Take half tablet (20mg ) twice daily. 03/26/22   03/28/22., NP  OVER THE COUNTER MEDICATION ZZZQuil    [provider]  pantoprazole (PROTONIX) 40 MG tablet Take 1 tablet (40 mg total) by mouth 2 (two) times daily. 09/22/22   Kozlow, 11/22/22, MD  Quercetin 500 MG CAPS Take 1 mg by mouth 2 (two) times daily. Patient not taking: Reported on 11/16/2022    [provider]   Study - ORION 4 - inclisiran 300 mg/1.63mL or placebo SQ injection (PI-Stuckey) Inject 1.5 mLs (300 mg total) into the skin every 6 (six) months. 03/12/22   03/14/22, MD      Allergies    Oxycodone, Oxycodone-acetaminophen, Oxycontin [oxycodone hcl], Percocet [oxycodone-acetaminophen], Ambien [zolpidem tartrate], and Zolpidem    Review of Systems   Review of Systems  Constitutional:  Negative for fever.  HENT:  Positive for sore throat.   Respiratory:  Negative for cough and shortness of breath.   Gastrointestinal:  Negative for vomiting.  Musculoskeletal:  Positive for myalgias.  Neurological:  Positive for headaches.    Physical Exam Updated Vital Signs BP (!) 136/98 (BP Location: Right Arm)   Pulse 70   Temp 98 F (36.7 C) (Oral)   Resp 18   SpO2 94%  Physical Exam Vitals and nursing note reviewed.  Constitutional:      General: He is not in acute distress.    Appearance: He is well-developed. He is not ill-appearing or diaphoretic.  HENT:     Head: Normocephalic and atraumatic.  Cardiovascular:     Rate and Rhythm: Normal rate and regular rhythm.     Heart sounds: Normal heart sounds.  Pulmonary:     Effort: Pulmonary effort is normal.     Breath sounds: Normal breath sounds. No wheezing.  Abdominal:     Palpations: Abdomen is soft.     Tenderness: There is no abdominal tenderness.  Skin:    General: Skin is warm and dry.  Neurological:     Mental Status: He is alert.     ED Results / Procedures / Treatments   Labs (all labs ordered are listed, but only abnormal results are displayed) Labs Reviewed  RESP PANEL BY RT-PCR (RSV, FLU A&B, COVID)  [id number] - Abnormal; Notable for the following components:      Result Value   SARS Coronavirus 2 by RT PCR POSITIVE (*)    All other components within normal limits    EKG None  Radiology DG Chest 2 View  Result Date: 12/08/2022 CLINICAL DATA:  History of asthma with shortness of breath EXAM: CHEST - 2  VIEW COMPARISON:  Chest radiograph dated 05/19/2022 FINDINGS: Mildly hyperinflated lungs. No focal consolidations. No pleural effusion or pneumothorax. The heart size and mediastinal contours are within normal limits. The visualized skeletal structures are unremarkable. IMPRESSION: No active cardiopulmonary disease. Electronically Signed   By: 07/19/2022 M.D.   On: 12/08/2022 13:31    Procedures Procedures    Medications Ordered in ED Medications - No data to display  ED Course/ Medical Decision Making/ A&P  Medical Decision Making Amount and/or Complexity of Data Reviewed External Data Reviewed: notes. Labs:     Details: COVID test is positive Radiology: ordered and independent interpretation performed.    Details: No pneumonia   Patient presents with COVID-19.  Well within the 5-day window to start Paxlovid if he wants.  He is not sure if he wants to do it but would like a prescription just in case.  Otherwise he is well-appearing.  His sats are above 92% when I am in the room.  He has no shortness of breath, cough, wheezing, pneumonia.  At this point, will give Tylenol as he is complaining of a headache and discussed supportive care options at home.  Otherwise, he appears stable for outpatient treatment.  His most recent GFR is over 90 within the last half year and so I do not think he needs repeat testing today in regards to Paxlovid itself.  Will discharge home with return precautions.        Final Clinical Impression(s) / ED Diagnoses Final diagnoses:  COVID-19    Rx / DC Orders ED Discharge Orders     None         Pricilla Loveless, MD 12/08/22 [phone]

## 2022-12-08 NOTE — ED Notes (Signed)
Discharge instructions reviewed with patient. Patient verbalizes understanding, no further questions at this time. Medications/prescriptions and follow up information provided. No acute distress noted at time of departure.  

## 2022-12-08 NOTE — Discharge Instructions (Addendum)
Your Covid-19 test is positive.  Your prescribed Paxlovid to help minimize the symptoms and hopefully shorten the course.  Do not take your cholesterol medicine while you are on this.  Discussed your medicine list with the pharmacist to see if there are other meds you should avoid.  Otherwise, you should follow-up with your primary care physician.  If you develop high fever that will go away, coughing up blood, trouble breathing, or any other new/concerning symptoms then return to the ER for evaluation.

## 2022-12-08 NOTE — ED Triage Notes (Signed)
Pt home with c/o sore throat and aches and pains , had positive covid test at home just wants to confirm

## 2022-12-10 ENCOUNTER — Ambulatory Visit: Payer: Medicare Other

## 2022-12-15 ENCOUNTER — Ambulatory Visit: Payer: Medicare Other | Attending: Physical Medicine and Rehabilitation | Admitting: Physical Therapy

## 2022-12-17 ENCOUNTER — Telehealth: Payer: Self-pay | Admitting: Internal Medicine

## 2022-12-17 NOTE — Telephone Encounter (Signed)
Pt c/o BP issue: STAT if pt c/o blurred vision, one-sided weakness or slurred speech  1. What are your last 5 BP readings?   190/126 (last night when he went to urgent care) 170/100 (early this morning) 112/92 (while on phone)  2. Are you having any other symptoms (ex. Dizziness, headache, blurred vision, passed out)?   Headache, a little dizzines and blurred vision  3. What is your BP issue?   Patient stated he was given medication (Apresoline/Hydralazine) which brought his BP down some.  Patient stated he took his regular BP medicine around 7:30 am.  Patient would like advice on next steps.

## 2022-12-17 NOTE — Telephone Encounter (Signed)
Spoke with pt, he has noticed that his blood pressure has been elevated since Monday. He finally went to the urgent care and he tested positive for covid. He has taken paxlovid and he is feeling some better. Last night at 12:30 am his blood pressure was elevated at 170/100 and he took 1 or 2 hydralazine, can not remember. This morning he has taken his carvedilol, isosorbide and olmesartan and he checked his blood pressure prior to my call and it is now 91/70. Patient reports that since he was seen in may of last year, his blood pressure has been fine and it has only been for the last week or so that his blood pressure has been up. Explained that probably the combination of the holidays and covid is why there is a spike at this time. He would like to know if dr hilty thinks he needs to change his medications. Aware will forward for dr hilty to review but he is not in the office.

## 2022-12-18 NOTE — Telephone Encounter (Signed)
Spoke with pt, aware of dr hilty's recommendations.  

## 2023-01-05 ENCOUNTER — Ambulatory Visit: Payer: Medicare Other

## 2023-01-12 ENCOUNTER — Ambulatory Visit: Payer: Medicare Other

## 2023-01-14 ENCOUNTER — Ambulatory Visit (INDEPENDENT_AMBULATORY_CARE_PROVIDER_SITE_OTHER): Payer: Medicare Other

## 2023-01-14 DIAGNOSIS — J4550 Severe persistent asthma, uncomplicated: Secondary | ICD-10-CM

## 2023-02-11 ENCOUNTER — Ambulatory Visit (INDEPENDENT_AMBULATORY_CARE_PROVIDER_SITE_OTHER): Payer: Medicare Other

## 2023-02-11 ENCOUNTER — Telehealth: Payer: Self-pay | Admitting: Internal Medicine

## 2023-02-11 DIAGNOSIS — J4550 Severe persistent asthma, uncomplicated: Secondary | ICD-10-CM

## 2023-02-11 NOTE — Telephone Encounter (Signed)
Left message to call back MyChart message sent 

## 2023-02-11 NOTE — Telephone Encounter (Signed)
Louis Casino, MD  to Me     02/11/23  4:41 PM Would advise he start taking the hydralazine regularly, not as needed (if he is not already) - would take 10 mg, three times daily (every 8 hours)  Dr Lemmie Evens

## 2023-02-11 NOTE — Telephone Encounter (Signed)
Pt c/o BP issue: STAT if pt c/o blurred vision, one-sided weakness or slurred speech  1. What are your last 5 BP readings?  2/26: 166/110 7:00 PM          171/108 9:00 PM    2/27: 156/102 in the AM          177/125 11:00 AM          169/111 6:00 PM            160/108 10:00 PM  2/28: 155/108 7:10 AM          168/106 4:30 PM          127/91 8:00 AM          133/90 10:00 PM  2/29: 144/104 7:00 AM          157/106 10:00 AM          187/122 2:00 PM  2. Are you having any other symptoms (ex. Dizziness, headache, blurred vision, passed out)?  Very tired   3. What is your BP issue?  Per patient's wife, BP is elevated. Wife also mentions that the patient has been taking Hydralazine, prescribed while he was in the hospital.

## 2023-02-11 NOTE — Telephone Encounter (Signed)
Called pt's wife. She states she has been trying to get him to call us but he says "I have an appt coming up." She denies other symptoms just very tired. Per his wife he has been resting for at least 5 minutes before taking the blood pressure. She is scared he will have a stroke. Patient encouraged to go to the Emergency Department for worsening symptoms. Sghe verbalized understanding no further questions at this time.

## 2023-02-12 NOTE — Telephone Encounter (Signed)
Called patient, spoke with wife, advised of message below.   She will have husband take 10 mg hydralazine three times daily.   Patient wife verbalized understanding.

## 2023-02-12 NOTE — Telephone Encounter (Signed)
Follow Up:     Patient's wife is returning call from yesterday.

## 2023-03-11 ENCOUNTER — Ambulatory Visit (INDEPENDENT_AMBULATORY_CARE_PROVIDER_SITE_OTHER): Payer: Medicare Other

## 2023-03-11 DIAGNOSIS — J4550 Severe persistent asthma, uncomplicated: Secondary | ICD-10-CM | POA: Diagnosis not present

## 2023-03-14 ENCOUNTER — Other Ambulatory Visit: Payer: Self-pay | Admitting: Internal Medicine

## 2023-03-23 ENCOUNTER — Other Ambulatory Visit: Payer: Self-pay

## 2023-03-23 ENCOUNTER — Ambulatory Visit (INDEPENDENT_AMBULATORY_CARE_PROVIDER_SITE_OTHER): Payer: Medicare Other | Admitting: Allergy and Immunology

## 2023-03-23 ENCOUNTER — Encounter: Payer: Self-pay | Admitting: Allergy and Immunology

## 2023-03-23 VITALS — BP 128/78 | HR 63 | Temp 97.1°F | Resp 16 | Ht 75.0 in | Wt 219.1 lb

## 2023-03-23 DIAGNOSIS — J3089 Other allergic rhinitis: Secondary | ICD-10-CM

## 2023-03-23 DIAGNOSIS — K219 Gastro-esophageal reflux disease without esophagitis: Secondary | ICD-10-CM | POA: Diagnosis not present

## 2023-03-23 DIAGNOSIS — M2559 Pain in other specified joint: Secondary | ICD-10-CM

## 2023-03-23 DIAGNOSIS — J4550 Severe persistent asthma, uncomplicated: Secondary | ICD-10-CM

## 2023-03-23 MED ORDER — CETIRIZINE HCL 10 MG PO TABS: 10.0000 mg | ORAL_TABLET | Freq: Every day | ORAL | 1 refills | Status: DC | PRN

## 2023-03-23 MED ORDER — FAMOTIDINE 20 MG PO TABS: 40.0000 mg | ORAL_TABLET | Freq: Every evening | ORAL | 1 refills | Status: DC

## 2023-03-23 MED ORDER — ALBUTEROL SULFATE HFA 108 (90 BASE) MCG/ACT IN AERS: 2.0000 | INHALATION_SPRAY | RESPIRATORY_TRACT | 1 refills | Status: DC | PRN

## 2023-03-23 MED ORDER — BREZTRI AEROSPHERE 160-9-4.8 MCG/ACT IN AERO: 2.0000 | INHALATION_SPRAY | Freq: Two times a day (BID) | RESPIRATORY_TRACT | 5 refills | Status: DC

## 2023-03-23 MED ORDER — CLOTRIMAZOLE 10 MG MT TROC: 10.0000 mg | Freq: Two times a day (BID) | OROMUCOSAL | 1 refills | Status: DC

## 2023-03-23 MED ORDER — SPACER/AERO-HOLDING CHAMBERS DEVI: 1.0000 | 1 refills | Status: DC

## 2023-03-23 MED ORDER — IPRATROPIUM BROMIDE 0.06 % NA SOLN: 2.0000 | Freq: Four times a day (QID) | NASAL | 1 refills | Status: DC | PRN

## 2023-03-23 MED ORDER — PANTOPRAZOLE SODIUM 40 MG PO TBEC: 40.0000 mg | DELAYED_RELEASE_TABLET | Freq: Two times a day (BID) | ORAL | 1 refills | Status: DC

## 2023-03-23 MED ORDER — ALBUTEROL SULFATE (2.5 MG/3ML) 0.083% IN NEBU: 2.5000 mg | INHALATION_SOLUTION | RESPIRATORY_TRACT | 1 refills | Status: DC | PRN

## 2023-03-23 NOTE — Patient Instructions (Addendum)
  1. Continue Breztri - 2 inhalations 1-2 times per day w/ spacer (empty lungs).    2. Continue Tezepelumab injections every 4 weeks  3. START clortrimazole troche after Breztri use (replace nystatin).  4. Continue pantoprazole 40 mg - 2 times per day + famotidine 40 mg - in evening  5. Continue the following if needed:  A. Albuterol HFA - 2 inhalations every 4-6 hours B. Ipratropium 0.06% - 1-2 sprays each nostril every 6 hours to dry nose C. OTC antihistamine - claritin / allegra / zyrtec  6. Blood for arthralgia: RF, CCP, ANA w/R, SED, CRP  7. Return to clinic in 6 months or earlier if problem

## 2023-03-23 NOTE — Progress Notes (Unsigned)
[place] - [place] - [place] - [place] - [place]   Follow-up Note  Referring Provider: Roe Coombs* Primary Provider: [facility] Date of Office Visit: 03/23/2023  Subjective:   Louis Perez (DOB: 12-Feb-1951) is a 72 y.o. male who [facility] in re-evaluation of the following:  HPI: Louis Perez returns to this clinic in evaluation of asthma, allergic rhinitis, LPR.  I last saw him in this clinic 22 September 2022.  He had excellent control of his respiratory tract issue while using tezepelumab injections every 4 weeks.  He continues to use Breztri 1 time per day and has no need to use the short acting bronchodilator and has not required a systemic steroid or antibiotic for any type of airway issue and at this point time does not need to use any antihistamines or nasal ipratropium.  His reflux is under excellent control on his current plan.  There has been a shortage of oral nystatin solution and he had a little bit of irritation of his mouth as he is not using this medication for the past 4 to 6 weeks.  He relates a history of having diffuse arthralgia involving hips and shoulders and collarbone and wrist which has been a progressive issue.  He has apparently had an x-ray which identified "arthritis".  Allergies as of 03/23/2023       Reactions   Oxycodone Anaphylaxis, Swelling   Oxycodone-acetaminophen Anaphylaxis   Tolerates Vicodin   Oxycontin [oxycodone Hcl] Anaphylaxis   Percocet [oxycodone-acetaminophen] Anaphylaxis   Tolerates Vicodin   Ambien [zolpidem Tartrate] Other (See Comments)   Mood changes    Zolpidem Other (See Comments)   Mood changes         Medication List    albuterol (2.5 MG/3ML) 0.083% nebulizer solution Commonly known as: PROVENTIL Take 3 mLs (2.5 mg total) by nebulization every 4 (four) hours as needed for wheezing or shortness of breath.   albuterol 108  (90 Base) MCG/ACT inhaler Commonly known as: Ventolin HFA Inhale 2 puffs into the lungs every 4 (four) hours as needed for wheezing or shortness of breath.   aspirin 81 MG tablet Take 81 mg by mouth at bedtime.   Breztri Aerosphere 160-9-4.8 MCG/ACT Aero Generic drug: Budeson-Glycopyrrol-Formoterol Inhale 2 puffs into the lungs in the morning and at bedtime.   budesonide 0.5 MG/2ML nebulizer solution Commonly known as: Pulmicort For asthma flares-take 2 mLs via nebulizer at morning and bedtime for 2 weeks or until asthma symptoms resolve   carvedilol 6.25 MG tablet Commonly known as: COREG Take 1 tablet by mouth twice daily   famotidine 20 MG tablet Commonly known as: PEPCID Take 2 tablets (40 mg total) by mouth in the morning. What changed: how much to take   hydrALAZINE 10 MG tablet Commonly known as: APRESOLINE Take 1 tablet (10 mg total) by mouth as needed (for systolic blood pressure more than 160).   ipratropium 0.06 % nasal spray Commonly known as: ATROVENT Place 1 spray into the nose 4 (four) times daily as needed for rhinitis.   isosorbide mononitrate 60 MG 24 hr tablet Commonly known as: IMDUR Take 1 tablet (60 mg total) by mouth daily. KEEP OV.   levocetirizine 5 MG tablet Commonly known as: XYZAL Take 1 tablet (5 mg total) by mouth 2 (two) times daily as needed (Can take an extra dose during flare ups.).   multivitamin with minerals Tabs tablet Take 1 tablet by mouth daily.  naproxen sodium 220 MG tablet Commonly known as: ALEVE Take 220 mg by mouth daily as needed.   nitroGLYCERIN 0.4 MG SL tablet Commonly known as: NITROSTAT Place 1 tablet (0.4 mg total) under the tongue every 5 (five) minutes as needed for chest pain.   nystatin 100000 UNIT/ML suspension Commonly known as: MYCOSTATIN Take 5 mLs (500,000 Units total) by mouth 4 (four) times daily. Swish, gargle, and swallow after Breztri use.   olmesartan 40 MG tablet Commonly known as:  BENICAR Take 1 tablet by mouth once daily   ORION 4 inclisiran or placebo 300 mg/1.5 mL SQ injection Inject 1.5 mLs (300 mg total) into the skin every 6 (six) months.   OVER THE COUNTER MEDICATION ZZZQuil   pantoprazole 40 MG tablet Commonly known as: PROTONIX Take 1 tablet (40 mg total) by mouth 2 (two) times daily.   Quercetin 500 MG Caps Take 1 mg by mouth 2 (two) times daily.      Past Medical History:  Diagnosis Date   Arthritis    "hips, lower back, knees, shoulders, hands" (05/14/2016)   Asthma dx'd in 2014   Bacterial septicemia 01/2011   "both knee involved"   CAD (coronary artery disease)    a. NSTEMI 02/2016 s/p DES to LAD. b. Relook cath due to angina/prior high risk anatomy 05/14/16 -> continued medical therapy given concern that a stent strut could inferere with Cx itself.   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    NSTEMI (non-ST elevated myocardial infarction) 02/2016   [name] 03/02/2016   Recurrent upper respiratory infection (URI)    Sinus bradycardia    Unspecified tinnitus    right    Past Surgical History:  Procedure Laterality Date   BACK SURGERY     CARDIAC CATHETERIZATION N/A 03/01/2016   Procedure: Left Heart Cath and Coronary Angiography;  Surgeon: Tonny Bollman, MD;  oLAD 95%, CFX/RI/RCA/RPDA 25-30%   CARDIAC CATHETERIZATION N/A 03/01/2016   Procedure: Coronary Stent Intervention;  Surgeon: Tonny Bollman, MD;  Promus Premier 4.0 x  12 mm DES to oLAD   CARDIAC CATHETERIZATION N/A 05/14/2016   Procedure: Left Heart Cath and Coronary Angiography;  Surgeon: Lennette Bihari, MD;  Location: [facility] INVASIVE CV LAB;  Service: Cardiovascular;  Laterality: N/A;   CARDIAC CATHETERIZATION N/A 06/17/2016   Procedure: Intravascular Pressure Wire/FFR Study;  Surgeon: Tonny Bollman, MD;  Location: [facility] INVASIVE CV LAB;  Service: Cardiovascular;  Laterality: N/A;    CARDIOVASCULAR STRESS TEST  05/26/2012   EKG negative for ischemia, no ECG changes, no significant ischemia noted   I & D KNEE WITH POLY EXCHANGE Bilateral 01/2011   & complete synovectomy of 3 compartments of the right total knee/notes 02/04/2011 (replacement,partial knee replacement because of bacterial infection)   JOINT REPLACEMENT     KNEE ARTHROSCOPY Right ~ 2002   LUMBAR DISC SURGERY  ~ 1980; 2000   NASAL SINUS SURGERY  08/2011   SHOULDER ARTHROSCOPY W/ ROTATOR CUFF REPAIR Right 01/2015   TOTAL KNEE ARTHROPLASTY Bilateral 03/26/2004-09/15/2004    right-left   TRANSTHORACIC ECHOCARDIOGRAM  05/26/2012   EF >55%, mild concentric LVH    Review of systems negative except as noted in HPI / PMHx or noted below:  Review of Systems  Constitutional: Negative.   HENT: Negative.    Eyes: Negative.   Respiratory: Negative.    Cardiovascular: Negative.   Gastrointestinal: Negative.  Genitourinary: Negative.   Musculoskeletal: Negative.   Skin: Negative.   Neurological: Negative.   Endo/Heme/Allergies: Negative.   Psychiatric/Behavioral: Negative.       Objective:   Vitals:   03/23/23 0910  BP: 128/78  Pulse: 63  Resp: 16  Temp: (!) 97.1 F (36.2 C)  SpO2: 94%   Height: 6\' 3"  (190.5 cm)  Weight: 219 lb 1.6 oz (99.4 kg)   Physical Exam Constitutional:      Appearance: He is not diaphoretic.  HENT:     Head: Normocephalic.     Right Ear: Tympanic membrane, ear canal and external ear normal.     Left Ear: Tympanic membrane, ear canal and external ear normal.     Nose: Nose normal. No mucosal edema or rhinorrhea.     Mouth/Throat:     Pharynx: Uvula midline. No oropharyngeal exudate.  Eyes:     Conjunctiva/sclera: Conjunctivae normal.  Neck:     Thyroid: No thyromegaly.     Trachea: Trachea normal. No tracheal tenderness or tracheal deviation.  Cardiovascular:     Rate and Rhythm: Normal rate and regular rhythm.     Heart sounds: Normal heart sounds, S1 normal and S2  normal. No murmur heard. Pulmonary:     Effort: No respiratory distress.     Breath sounds: Normal breath sounds. No stridor. No wheezing or rales.  Lymphadenopathy:     Head:     Right side of head: No tonsillar adenopathy.     Left side of head: No tonsillar adenopathy.     Cervical: No cervical adenopathy.  Skin:    Findings: No erythema or rash.     Nails: There is no clubbing.  Neurological:     Mental Status: He is alert.     Diagnostics:    Spirometry was performed and demonstrated an FEV1 of 2.49 at 68 % of predicted.  Assessment and Plan:   1. Asthma, severe persistent, well-controlled   2. Other allergic rhinitis   3. LPRD (laryngopharyngeal reflux disease)   4. Pain in other joint    1. Continue Breztri - 2 inhalations 1-2 times per day w/ spacer (empty lungs).    2. Continue Tezepelumab injections every 4 weeks  3. START clortrimazole troche after Breztri use (replace nystatin).  4. Continue pantoprazole 40 mg - 2 times per day + famotidine 40 mg - in evening  5. Continue the following if needed:  A. Albuterol HFA - 2 inhalations every 4-6 hours B. Ipratropium 0.06% - 1-2 sprays each nostril every 6 hours to dry nose C. OTC antihistamine - claritin / allegra / zyrtec  6. Blood for arthralgia: RF, CCP, ANA w/R, SED, CRP  7. Return to clinic in 6 months or earlier if problem  Louis Perez appears to be doing quite well regarding his respiratory tract issue on his current plan of anti-TSLP antibody and he can continue on this biologic agent as well as a low-dose of Breztri and use clotrimazole troches to prevent the development of thrush and continue to also treat his reflux as noted above.  He has arthralgia and he will be visiting with his orthopedic surgeon later this month and I think it be worthwhile to exclude the possibility of an inflammatory arthritis contributing to his symptoms and we will check those blood tests today and he can bring these to his orthopedic  surgeon.  Laurette SchimkeEric Rogelio Winbush, MD Allergy / Immunology [facility]

## 2023-03-24 ENCOUNTER — Encounter: Payer: Self-pay | Admitting: Allergy and Immunology

## 2023-03-25 LAB — ENA+DNA/DS+SJORGEN'S
ENA RNP Ab: 1.1 AI — ABNORMAL HIGH (ref 0.0–0.9)
ENA SM Ab Ser-aCnc: <0.2 AI (ref 0.0–0.9)
ENA SSA (RO) Ab: <0.2 AI (ref 0.0–0.9)
ENA SSB (LA) Ab: <0.2 AI (ref 0.0–0.9)
dsDNA Ab: 1 IU/mL (ref 0–9)

## 2023-03-25 LAB — SEDIMENTATION RATE: Sed Rate: 13 mm/hr (ref 0–30)

## 2023-03-25 LAB — CYCLIC CITRUL PEPTIDE ANTIBODY, IGG/IGA: Cyclic Citrullin Peptide Ab: 5 units (ref 0–19)

## 2023-03-25 LAB — ANA W/REFLEX: Anti Nuclear Antibody (ANA): POSITIVE — AB

## 2023-03-25 LAB — C-REACTIVE PROTEIN: CRP: 2 mg/L (ref 0–10)

## 2023-03-25 LAB — RHEUMATOID FACTOR: Rheumatoid fact SerPl-aCnc: <10 IU/mL (ref <14.0)

## 2023-04-06 ENCOUNTER — Ambulatory Visit: Payer: Medicare Other | Attending: Internal Medicine | Admitting: Internal Medicine

## 2023-04-06 ENCOUNTER — Encounter: Payer: Self-pay | Admitting: Internal Medicine

## 2023-04-06 VITALS — BP 93/60 | HR 65 | Ht 75.0 in | Wt 214.2 lb

## 2023-04-06 DIAGNOSIS — J4551 Severe persistent asthma with (acute) exacerbation: Secondary | ICD-10-CM | POA: Insufficient documentation

## 2023-04-06 DIAGNOSIS — I2510 Atherosclerotic heart disease of native coronary artery without angina pectoris: Secondary | ICD-10-CM | POA: Insufficient documentation

## 2023-04-06 DIAGNOSIS — Z9189 Other specified personal risk factors, not elsewhere classified: Secondary | ICD-10-CM | POA: Insufficient documentation

## 2023-04-06 DIAGNOSIS — R0989 Other specified symptoms and signs involving the circulatory and respiratory systems: Secondary | ICD-10-CM | POA: Insufficient documentation

## 2023-04-06 NOTE — Progress Notes (Signed)
Cardiology Office Note   Date:  04/06/2023   ID:  Louis Perez, DOB 01-27-1951, MRN [id number]  PCP:  [facility]  Cardiologist:   Chrystie Nose, MD   CC: Follow-up  History of Present Illness: Louis Perez is a 72 y.o. male who presents for evaluation of chest and back pain. Patient has a history of coronary disease with recent NSTEMI in March with DES stenting of the LAD.  He had non obstructive disease elsewhere.  EF was normal.    He was added to my schedule today for evaluation of chest and arm discomfort. This began on Sunday. It is similar to his previous unstable angina but not as severe. Still he describes the peak of his discomfort at 7 out of 10 in intensity. He gets across his chest. His left arm hurt. At times he has noticed some diaphoresis. It might last for only a minute. He has taken nitroglycerin and over the course of the last few days and it improves the symptoms. He describes it as somewhat heavy. He's not had any palpitations, presyncope or syncope. He's not describing any new shortness of breath, PND or orthopnea. He has brought the discomfort on a few times when he's been doing activities although this is been intermittent. He was actually able to do a little golfing yesterday without symptoms. However, prior to this he had discomfort with golfing building his children sandbox. This morning the pain was at rest. He still describes a little mid chest tightness.  Of note he has not had the symptoms since his stent.  I did review his catheterization. I looked at the images and he had a very high grade proximal LAD in March with nonobstructive disease in the right coronary.  05/28/2016  Louis Perez is a 72 year old patient who I last saw in 2014. He did not see me in follow-up since that time and is seen a number of other providers in our practice, ultimately leading to presentation with an STEMI in March with a placement  of a drug-eluting stent in the proximal LAD. Subsequent to that he had chest pain as described above and was referred back to the hospital for coronary evaluation. Catheterization indicated an 80% ostial ramus lesion at the area of partially jailed vessel due to the proximal LAD stent. It was felt that intervention could compromise the LAD therefore medical therapy was recommended. His isosorbide was doubled to 60 mg daily. Since then he reports he has had some improvement in chest discomfort but has significant headache.  06/15/2016  Louis Perez was seen back today in the office for chest pain. When I last saw him we had increased his isosorbide up to 60 mg daily and he's been on Ranexa thousand milligrams twice a day. He reports he continues to have episodes of chest discomfort which can occur at rest or with exertion. I've advised that he stop cardiac rehabilitation until we can determine whether or not his pain is related to coronary obstruction.  07/23/2016  Louis Perez returns for follow-up today. He underwent repeat cardiac catheterization by Dr. Excell Seltzer. This showed the following:  Conclusion   Prox RCA to Mid RCA lesion, 25% stenosed. Ost LM lesion, 25% stenosed. Ost Ramus lesion, 75% stenosed. Mid LAD to Dist LAD lesion, 50% stenosed. Ost 2nd Diag to 2nd Diag lesion, 30% stenosed.  1. Continued patency of the proximal LAD stent 2. Mild diffuse nonobstructive RCA stenosis with diffuse  coronary ectasia 3. Patent left circumflex 4. Moderate-severe stenosis of the ramus intermedius with negative pressure wire analysis (FFR = 0.92 baseline and 0.85 at peak hyperemia)   Recommend: resume cardiac rehab, medical therapy   No intervention was performed. Since discharge she notes that he's had no further chest pain. He feels like the change in his isosorbide is been helpful. He is about to finish card agreeable dictation and will continue to exercise at the [facility]. He's lost about 5-7 pounds and is  well within the normal weight range at this time.  01/04/2017  Louis Perez returns today for follow-up. He reports that initially had some improvement with increasing his medical therapy for angina but still continues to have sharp, intermittent chest pains on a daily basis. This is typically in the right sternal area. He would not clearly say that his symptoms are progressively getting worse. Blood pressure is well-controlled today. His LDL cholesterol is 93 on 80 mg Lipitor. I advised him that his goal cholesterol should be much lower and that we may have difficulty reaching that since he is on a high potency statin. Options include adding ezetimibe or perhaps enrolling him in the [place]-10 trial. He did seem to be interested in the study medication. His last lipid profile was earlier this year and he will be due for repeat blood work. He is also had problems with asthma and has seen Dr. Sherene Sires for evaluation of this and to include cyclical shortness of breath.  03/22/2017  Louis Perez returns today for follow-up. He is doing fairly well denies any recurrent chest pain. Unfortunately has an upper respiratory infection and is had a flare of his asthma recently. Cholesterol is much better control with LDL now down to 64 on recent lab work with an LDL particle number less than 1000. ApoB and LPA level both at goal as well. It's now been over one year since his drug-eluting stent. He underwent a relook catheterization which showed a patent stent a couple months after his initial procedure for ongoing chest pain. He is also on Ranexa and isosorbide. He is interested in coming off some of his medicines.  08/02/2017  Louis Perez returns today for follow-up. He reports he occasionally gets some chest discomfort but its quick, sharp and atypical for coronary disease. He's found nothing to be limiting of his activities. He weaned himself off Ranexa by his request. He remains on isosorbide 60 mg twice a day. He has not  required short acting nitroglycerin. Overall he feels fairly well. His energy level is good. He is on atorvastatin 80 mg with an LDL C a less than 70. Blood pressure is at goal.  02/15/2018  Louis Perez returns today for follow-up.  He continues to get occasional sharp chest discomfort.  This is a similar complaint he had in August of last year.  He took himself off Ranexa and noticed no difference in his symptoms.  He is on isosorbide.  He said none of the episodes last long enough to take short acting nitro.  His wife is concerned only that he seems to mention it more frequently.  The episodes are not associated with exertion or relieved by rest.  EKG personally reviewed today shows normal sinus rhythm without ischemic changes at 64.  09/26/2018  Louis Perez is seen today in follow-up.  Over the past year he is done very well.  He is now walking up to about 20 miles a week.  He denies any sharp chest  pains or worsening shortness of breath.  He is off of his antianginal medications.  He had lipid testing this summer and LDL was 81.  He reports a pretty healthy diet however does have some intake of saturated fats that could be improved.  11/14/2019  Louis Perez is seen today in follow-up.  This is a routine visit however he has been having some symptoms.  Recently has had some worsening dyspnea on exertion.  He did see his pulmonologist and had medications adjusted for his asthma and was treated for possible upper respiratory infection.  He is also been struggling with sores in his mouth and was placed on a steroid rinse for that.  He is noted labile blood pressures recently.  His symptoms are worse with regards to dyspnea walking up hills but he denies any chest pain.  His last coronary evaluation was in 2017 at which time he had FFR of the ramus intermedius which was 0.85 noting a 75% ostial lesion.  This was not intervened upon.  He also had 50% mid to distal LAD stenosis and some mild disease of the  other coronaries.  He has been on medical therapy since then.  I am concerned that he is possibly had some progressive coronary disease.  11/28/2019  Louis Perez returns today for follow-up.  He underwent stress testing which was negative for ischemia and demonstrated normal LV function.  After adjusting his medications his blood pressure is now better controlled.  He says the shortness of breath has improved as well.  06/25/2020  Louis Perez is seen today in follow-up.  Recently he was seen by Azalee Course, PA-C in April.  At that time he was having some intermediate chest discomfort.  His Imdur was increased.  Since then he said no more symptoms.  Overall he says he feels really well.  He gets some occasional shortness of breath but he attributes this to his asthma which is quite significant requiring a number of inhalers and immunotherapy.  Blood pressures well controlled today.  He is overdue for lipid profile which was last assessed in November 2019.  LDL at the time was 73.  EKG personally reviewed today shows sinus rhythm.  04/21/2021  Louis Perez is seen today in follow-up.  He recently enrolled in the [place] for trial.  This would randomize him to inclisiran or placebo on top of his atorvastatin.  He denies any chest pain or worsening shortness of breath.  He is now 5 years out from PCI.  EKG shows a sinus rhythm at 64.  04/14/2022  Louis Perez returns today for follow-up.  He recently saw Robin Searing, NP for acute blood pressure management.  He was noticing spikes in his blood pressure mostly in the evening.  He was placed on a 24-hour ambulatory blood pressure monitor which I personally reviewed.  This showed actually good control of his blood pressure except for as he mention a spike in blood pressure little before midnight.  He is also been having worsening shortness of breath over the past several weeks, particularly since Easter.  He recently saw his allergist and was noted to have severe  reduction in lung function and felt to have acute asthma exacerbation.  He was prescribed steroids and given a Depo steroid.  He has not started the oral steroids and was not able to fill his inhaler.  He still reports he is shortness of breath.  Blood pressure appears well controlled today.  He was also complaining of  generalized body pains.  I thought this might be related to his statin and I advise discontinuing it for statin holiday.  He has been off of it for less than a week but notices no difference.  I did advise him that it could take 2 to 4 weeks for this to improve.  He is also enrolled in the [place] 4, and I will update the principal investigator of this change.  04/06/2023  Louis Perez returns for follow-up.  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  Last year I placed an amatory blood pressure monitor and noted that he was having some elevated blood pressures in the evening.  I had readjusted his medications and split some medicines to get even coverage throughout the day.  The seem to be doing well up until he said around Christmas when he contracted COVID-19.  He had been vaccinated and took Paxlovid.  Despite this, his blood pressures have been labile since then.  He says he gets a headache when his blood pressure is elevated.  He has seen some blood pressures in the 200s but mostly in the 150-160 systolic.  Today actually the blood pressure was normal prior to coming in and in the office his blood pressure is actually low at 93/60.  His blood pressure cuff is reasonably new it was purchased last year.  After speaking with him and his wife it turns out that he does snore and has had some difficulty staying asleep at night and does feel some fatigue during the day and there may be a component of sleep apnea.  Past Medical History:  Diagnosis Date   Arthritis    "hips, lower back, knees, shoulders, hands" (05/14/2016)   Asthma dx'd in 2014   Bacterial septicemia 01/2011    "both knee involved"   CAD (coronary artery disease)    a. NSTEMI 02/2016 s/p DES to LAD. b. Relook cath due to angina/prior high risk anatomy 05/14/16 -> continued medical therapy given concern that a stent strut could inferere with Cx itself.   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    NSTEMI (non-ST elevated myocardial infarction) 02/2016   [name] 03/02/2016   Recurrent upper respiratory infection (URI)    Sinus bradycardia    Unspecified tinnitus    right    Past Surgical History:  Procedure Laterality Date   BACK SURGERY     CARDIAC CATHETERIZATION N/A 03/01/2016   Procedure: Left Heart Cath and Coronary Angiography;  Surgeon: Tonny Bollman, MD;  oLAD 95%, CFX/RI/RCA/RPDA 25-30%   CARDIAC CATHETERIZATION N/A 03/01/2016   Procedure: Coronary Stent Intervention;  Surgeon: Tonny Bollman, MD;  Promus Premier 4.0 x  12 mm DES to oLAD   CARDIAC CATHETERIZATION N/A 05/14/2016   Procedure: Left Heart Cath and Coronary Angiography;  Surgeon: Lennette Bihari, MD;  Location: [facility] INVASIVE CV LAB;  Service: Cardiovascular;  Laterality: N/A;   CARDIAC CATHETERIZATION N/A 06/17/2016   Procedure: Intravascular Pressure Wire/FFR Study;  Surgeon: Tonny Bollman, MD;  Location: [facility] INVASIVE CV LAB;  Service: Cardiovascular;  Laterality: N/A;   CARDIOVASCULAR STRESS TEST  05/26/2012   EKG negative for ischemia, no ECG changes, no significant ischemia noted   I & D KNEE WITH POLY EXCHANGE Bilateral 01/2011   & complete synovectomy of 3 compartments of the right total knee/notes 02/04/2011 (replacement,partial knee replacement because of bacterial infection)  JOINT REPLACEMENT     KNEE ARTHROSCOPY Right ~ 2002   LUMBAR DISC SURGERY  ~ 1980; 2000   NASAL SINUS SURGERY  08/2011   SHOULDER ARTHROSCOPY W/ ROTATOR CUFF REPAIR Right 01/2015   TOTAL KNEE ARTHROPLASTY Bilateral 03/26/2004-09/15/2004    right-left    TRANSTHORACIC ECHOCARDIOGRAM  05/26/2012   EF >55%, mild concentric LVH     Current Outpatient Medications  Medication Sig Dispense Refill   albuterol (PROVENTIL) (2.5 MG/3ML) 0.083% nebulizer solution Take 3 mLs (2.5 mg total) by nebulization every 4 (four) hours as needed for wheezing or shortness of breath. 75 mL 1   albuterol (VENTOLIN HFA) 108 (90 Base) MCG/ACT inhaler Inhale 2 puffs into the lungs every 4 (four) hours as needed for wheezing or shortness of breath. 18 g 1   aspirin 81 MG tablet Take 81 mg by mouth at bedtime.      Budeson-Glycopyrrol-Formoterol (BREZTRI AEROSPHERE) 160-9-4.8 MCG/ACT AERO Inhale 2 puffs into the lungs in the morning and at bedtime. 10.7 g 5   carvedilol (COREG) 6.25 MG tablet Take 1 tablet by mouth twice daily 180 tablet 0   cetirizine (ZYRTEC) 10 MG tablet Take 1 tablet (10 mg total) by mouth daily as needed for allergies (Can take an extra dose during flare ups.). 180 tablet 1   clotrimazole (MYCELEX) 10 MG troche Take 1 tablet (10 mg total) by mouth 2 (two) times daily. Use after taking Breztri 180 tablet 1   famotidine (PEPCID) 20 MG tablet Take 2 tablets (40 mg total) by mouth at bedtime. 90 tablet 1   hydrALAZINE (APRESOLINE) 10 MG tablet Take 1 tablet (10 mg total) by mouth as needed (for systolic blood pressure more than 160). (Patient taking differently: Take 25 mg by mouth as needed (for systolic blood pressure more than 160). Pt is taking 25 mg) 60 tablet 2   ipratropium (ATROVENT) 0.06 % nasal spray Place 2 sprays into the nose 4 (four) times daily as needed for rhinitis. 45 mL 1   isosorbide mononitrate (IMDUR) 60 MG 24 hr tablet Take 1 tablet (60 mg total) by mouth daily. KEEP OV. 90 tablet 0   levocetirizine (XYZAL) 5 MG tablet Take 1 tablet (5 mg total) by mouth 2 (two) times daily as needed (Can take an extra dose during flare ups.). 60 tablet 5   Multiple Vitamin (MULTIVITAMIN WITH MINERALS) TABS tablet Take 1 tablet by mouth daily.      naproxen sodium (ALEVE) 220 MG tablet Take 220 mg by mouth daily as needed.     nitroGLYCERIN (NITROSTAT) 0.4 MG SL tablet Place 1 tablet (0.4 mg total) under the tongue every 5 (five) minutes as needed for chest pain. 25 tablet 3   nystatin (MYCOSTATIN) 100000 UNIT/ML suspension Take 5 mLs (500,000 Units total) by mouth 4 (four) times daily. Swish, gargle, and swallow after Breztri use. 473 mL 5   olmesartan (BENICAR) 40 MG tablet Take 1 tablet by mouth once daily 90 tablet 0   OVER THE COUNTER MEDICATION ZZZQuil     pantoprazole (PROTONIX) 40 MG tablet Take 1 tablet (40 mg total) by mouth 2 (two) times daily. 180 tablet 1   Quercetin 500 MG CAPS Take 1 mg by mouth 2 (two) times daily.     Spacer/Aero-Holding Chambers DEVI 1 Device by Does not apply route as directed. 1 each 1   Current Facility-Administered Medications  Medication Dose Route Frequency Provider Last Rate Last Admin   Benralizumab SOSY 30 mg  30 mg  Subcutaneous Q8 weeks Alfonse Spruce, MD   30 mg at 03/31/22 1610   Study - [facility] 4 - inclisiran 300 mg/1.46mL or placebo SQ injection (PI-Stuckey)  300 mg Subcutaneous Q6 months Herby Abraham, MD       tezepelumab-ekko (TEZSPIRE) 210 MG/1. syringe 210 mg  210 mg Subcutaneous Q28 days Jessica Priest, MD   210 mg at 03/11/23 9604    Allergies:   Oxycodone, Oxycodone-acetaminophen, Oxycontin [oxycodone hcl], Percocet [oxycodone-acetaminophen], Ambien [zolpidem tartrate], and Zolpidem    Social History:  The patient  reports that he quit smoking about 54 years ago. His smoking use included cigarettes. He has a 5.00 pack-year smoking history. He has never used smokeless tobacco. He reports current alcohol use of about 1.0 standard drink of alcohol per week. He reports that he does not use drugs.   Family History:  The patient's family history includes Alzheimer's disease in his father; Cancer in his paternal aunt, paternal uncle, paternal uncle, and paternal uncle; Dementia  in his father; Heart disease in his mother; Heart disease (age of onset: 55) in his brother.    ROS:   Pertinent items noted in HPI and remainder of comprehensive ROS otherwise negative.  PHYSICAL EXAM: VS:  BP 93/60   Pulse 65   Ht  (1.905 m)   Wt 214 lb 3.2 oz (97.2 kg)   SpO2 96%   BMI 26.77 kg/m  , BMI Body mass index is 26.77 kg/m. General appearance: alert, no distress and Tanned skin Neck: no carotid bruit, no JVD and thyroid not enlarged, symmetric, no tenderness/mass/nodules Lungs: clear to auscultation bilaterally Heart: regular rate and rhythm, S1, S2 normal, no murmur, click, rub or gallop Abdomen: soft, non-tender; bowel sounds normal; no masses,  no organomegaly Extremities: extremities normal, atraumatic, no cyanosis or edema Pulses: 2+ and symmetric Skin: Skin color, texture, turgor normal. No rashes or lesions Neurologic: Grossly normal Psych: Pleasant  EKG:   Normal sinus rhythm at 65-personally reviewed  Recent Labs: No results found for requested labs within last 365 days.   Lipid Panel    Component Value Date/Time   CHOL 173 06/26/2020 0927   CHOL 136 01/08/2017 0833   TRIG 103 06/26/2020 0927   TRIG 109 01/08/2017 0833   HDL 54 06/26/2020 0927   HDL 53 01/08/2017 0833   CHOLHDL 3.2 06/26/2020 0927   CHOLHDL 2.6 01/08/2017 0833   CHOLHDL 3.0 03/01/2016 0448   VLDL 17 03/01/2016 0448   LDLCALC 100 (H) 06/26/2020 0927   LDLCALC 64 01/08/2017 0833     Wt Readings from Last 3 Encounters:  04/06/23 214 lb 3.2 oz (97.2 kg)  03/23/23 219 lb 1.6 oz (99.4 kg)  09/22/22 215 lb 12.8 oz (97.9 kg)    ASSESSMENT AND PLAN:  Patient Active Problem List   Diagnosis Date Noted   Constipation 06/29/2022   Other specified abnormal findings of blood chemistry 06/29/2022   Severe persistent asthma with acute exacerbation 11/25/2020   Shortness of breath 11/25/2020   Viral upper respiratory infection 11/25/2020   Asthma, severe persistent,  well-controlled 11/09/2018   Other allergic rhinitis 11/09/2018   LPRD (laryngopharyngeal reflux disease) 11/09/2018   Stomatitis and mucositis 11/09/2018   Tinnitus of both ears 09/05/2018   Visual disturbance 09/01/2018   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   Sensorineural hearing loss (SNHL) of right ear 07/11/2018   Pansinusitis 01/06/2017   Hyperlipidemia LDL goal <70 07/23/2016   Anginal chest pain at rest 06/17/2016  CAD in native artery 06/15/2016   Stented coronary artery 06/15/2016   Pain in the chest 05/28/2016   Sinus bradycardia 05/15/2016   Unstable angina 05/14/2016   CAD (coronary artery disease), native coronary artery 05/14/2016   Adjustment insomnia 02/28/2016   Gilbert's syndrome 02/28/2016   Hematospermia 02/28/2016   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  His blood pressure was finally better controlled last year.  He was seen in urgent care we increased his hydralazine to 25 mg 3 times daily from 10 mg 3 times daily.  He is dividing his olmesartan into twice daily doses.  He has been having some higher blood pressures which she says gives him headaches.  Today he is hypotensive at 93/60.  I think he needs more expert blood pressure management with regards to his lability.  Are not sure what else to do at this time.  He may have undiagnosed sleep apnea which could contribute to this.  His stop-bang score is 4.  Will place an ITMAR sleep monitor.  I will contact him with those results.  I like to refer him to Dr. Duke Salvia in her hypertension specialty clinic for her advice.  Follow-up with me in 6 months or sooner as necessary.  Chrystie Nose, MD, [facility], FACP  [place]  [facility]   Medical Director of the [facility]  Cardiovascular Risk Reduction Clinic Diplomate of the American Board of Clinical Lipidology Attending Cardiologist  Direct Dial: [phone]  Fax: [phone]  Website:  [email or web address]  Chrystie Nose, MD  04/06/2023 8:42 AM

## 2023-04-06 NOTE — Patient Instructions (Signed)
Medication Instructions:  Your physician recommends that you continue on your current medications as directed. Please refer to the Current Medication list given to you today.  *If you need a refill on your cardiac medications before your next appointment, please call your pharmacy*  Testing/Procedures: WatchPAT?  Is a FDA cleared portable home sleep study test that uses a watch and 3 points of contact to monitor 7 different channels, including your heart rate, oxygen saturations, body position, snoring, and chest motion.  The study is easy to use from the comfort of your own home and accurately detect sleep apnea.  Before bed, you attach the chest sensor, attached the sleep apnea bracelet to your nondominant hand, and attach the finger probe.  After the study, the raw data is downloaded from the watch and scored for apnea events.   For more information: [email or web address]  Patient Testing Instructions:  Do not put battery into the device until bedtime when you are ready to begin the test. Please call the support number if you need assistance after following the instructions below: 24 hour support line- [phone] or [facility] support at [phone] (option 2)  Download the [employer] AmericanWatchPAT One" app through the google play store or App Store  Be sure to turn on or enable access to bluetooth in settlings on your smartphone/ device  Make sure no other bluetooth devices are on and within the vicinity of your smartphone/ device and WatchPAT watch during testing.  Make sure to leave your smart phone/ device plugged in and charging all night.  When ready for bed:  Follow the instructions step by step in the WatchPAT One App to activate the testing device. For additional instructions, including video instruction, visit the WatchPAT One video on Youtube. You can search for [place] One within Youtube (video is 4 minutes and 18 seconds) or enter:  https://youtube/watch?[email or web address] Please note: You will be prompted to enter a Pin to connect via bluetooth when starting the test. The PIN will be assigned to you when you receive the test.  The device is disposable, but it recommended that you retain the device until you receive a call letting you know the study has been received and the results have been interpreted.  We will let you know if the study did not transmit to [place] properly after the test is completed. You do not need to call us to confirm the receipt of the test.  Please complete the test within 48 hours of receiving PIN.   Frequently Asked Questions:  What is Watch [place] one?  A single use fully disposable home sleep apnea testing device and will not need to be returned after completion.  What are the requirements to use WatchPAT one?  The be able to have a successful watchpat one sleep study, you should have your Watch pat one device, your smart phone, watch pat one app, your PIN number and Internet access What type of phone do I need?  You should have a smart phone that uses Android 5.1 and above or any Iphone with IOS 10 and above How can I download the WatchPAT one app?  Based on your device type search for WatchPAT one app either in google play for android devices or APP store for Iphone's Where will I get my PIN for the study?  Your PIN will be provided by your physician's office. It is used for authentication and if you lose/forget your PIN, please reach out to your providers office.  I do not have Internet  at home. Can I do WatchPAT one study?  WatchPAT One needs Internet connection throughout the night to be able to transmit the sleep data. You can use your home/local internet or your cellular's data package. However, it is always recommended to use home/local Internet. It is estimated that between 20MB-30MB will be used with each study.However, the application will be looking for space in the phone to start the study.   What happens if I lose internet or bluetooth connection?  During the internet disconnection, your phone will not be able to transmit the sleep data. All the data, will be stored in your phone. As soon as the internet connection is back on, the phone will being sending the sleep data. During the bluetooth disconnection, WatchPAT one will not be able to to send the sleep data to your phone. Data will be kept in the [facility] one until two devices have bluetooth connection back on. As soon as the connection is back on, WatchPAT one will send the sleep data to the phone.  How long do I need to wear the WatchPAT one?  After you start the study, you should wear the device at least 6 hours.  How far should I keep my phone from the device?  During the night, your phone should be within 15 feet.  What happens if I leave the room for restroom or other reasons?  Leaving the room for any reason will not cause any problem. As soon as your get back to the room, both devices will reconnect and will continue to send the sleep data. Can I use my phone during the sleep study?  Yes, you can use your phone as usual during the study. But it is recommended to put your watchpat one on when you are ready to go to bed.  How will I get my study results?  A soon as you completed your study, your sleep data will be sent to the provider. They will then share the results with you when they are ready.   Follow-Up: At [facility], you and your health needs are our priority.  As part of our continuing mission to provide you with exceptional heart care, we have created designated Provider Care Teams.  These Care Teams include your primary Cardiologist (physician) and Advanced Practice Providers (APPs -  Physician Assistants and Nurse Practitioners) who all work together to provide you with the care you need, when you need it.  We recommend signing up for the patient portal called "MyChart".  Sign up information is  provided on this After Visit Summary.  MyChart is used to connect with patients for Virtual Visits (Telemedicine).  Patients are able to view lab/test results, encounter notes, upcoming appointments, etc.  Non-urgent messages can be sent to your provider as well.   To learn more about what you can do with MyChart, go to [email or web address].    Your next appointment:   6 month(s)  Provider:   Chrystie Nose, MD     Other Instructions You have been referred to: Advanced Hypertension clinic with Dr. Chilton Si

## 2023-04-07 ENCOUNTER — Telehealth: Payer: Self-pay | Admitting: *Deleted

## 2023-04-07 NOTE — Telephone Encounter (Signed)
Louis Perez notified ok to have the patient to activate itamar device. His insurance does not require a PA.

## 2023-04-08 ENCOUNTER — Ambulatory Visit (INDEPENDENT_AMBULATORY_CARE_PROVIDER_SITE_OTHER): Payer: Medicare Other

## 2023-04-08 DIAGNOSIS — J4550 Severe persistent asthma, uncomplicated: Secondary | ICD-10-CM

## 2023-04-14 ENCOUNTER — Telehealth: Payer: Self-pay

## 2023-04-14 NOTE — Telephone Encounter (Signed)
LVM for patient to return my call concerning the Itamar device.  

## 2023-04-15 ENCOUNTER — Telehealth: Payer: Self-pay | Admitting: Internal Medicine

## 2023-04-15 NOTE — Telephone Encounter (Signed)
Patient states pressure in chest when walking or incline, "it is tight".  Usually does 3-4 miles a day and plays golf. Now walking up incline gets out of breath,and tired. States even going up the ramp to his door, he is out of breath.   He states this is the same feeling when he had a blockage in the past (approx 7 yrs ago) This has been for approx a week. BP is good. Not needed hydralazine or nitro.  Does get dizzy at times with standing up, but he stands and waits before moving and it resolves.  He is just concerned and states "this is not like me,  I am just so out of breath"  Sleep study not scheduled yet  BP readings Saturday 113/88 Sunday  92/64   92/64 Monday 145/90  104/77 Tuesday 104/65  106/72  Patient at funeral until 2:00

## 2023-04-15 NOTE — Telephone Encounter (Signed)
Patient is returning call. Patient will be at a funeral from 11AM - 2pm.  Pt c/o Shortness Of Breath: STAT if SOB developed within the last 24 hours or pt is noticeably SOB on the phone  1. Are you currently SOB (can you hear that pt is SOB on the phone)? No  2. How long have you been experiencing SOB? Within the last week  3. Are you SOB when sitting or when up moving around? Moving around  4. Are you currently experiencing any other symptoms? Patient stated it is when they are walking up an incline and will start to feel tightness in their chest. Patient stated that he feels like he doesn't have much energy. Please advise.

## 2023-04-15 NOTE — Telephone Encounter (Signed)
Spoke with patient .  He is set to see NP next Friday.  If SOB is accompanied by Chest pain, arm or jaw pain or SOB with distress, he is instructed to go to ED.He states understanding

## 2023-04-21 NOTE — H&P (View-Only) (Signed)
Cardiology Clinic Note   Date: 04/23/2023 ID: Louis Perez, DOB [birth year], MRN [id number]  Primary Cardiologist:  Chrystie Nose, MD  Patient Profile    Louis Perez is a 72 y.o. male who presents to the clinic today for DOE and vague chest pain, possible LHC.  Past medical history significant for: CAD. LHC 03/01/2016 (NSTEMI): Mid RCA 25%.  Distal RCA 30%.  Ostial RPDA 30%.  Ostial ramus to ramus 25%.  Distal LAD 50%.  Ostial to proximal CX 25%.  Ostial LAD 95%.  PCI with DES to ostial LAD. LHC 05/14/2016 (angina): Ramus 80%.  Distal RCA 20%.  Widely patent ostial LAD stent.  Progressive 80% ostial ramus intermedius stenosis which appears to be jailed by the very proximal stent strut extending into the left main from the LAD ostium.  Concern that possible PTCA of the diagonal through the stent strut risks the dilated strut may interfere with the circumflex takeoff. LHC FFR study 06/17/2016 (rest chest pain): Proximal to mid RCA 25%.  Ostial LM 25%.  Ostial ramus 75%.  Mid to distal LAD 50%.  Ostial D2-D2 30%.  Continued patency of proximal LAD stent.  Moderate to severe stenosis of the ramus intermedius with negative pressure wire analysis.  Recommend medical therapy. Nuclear stress test 11/20/2019: Normal, low risk study. Ascending aortic aneurysm. Echo 04/11/2020: EF 60 to 65%.  Grade I DD.  Aneurysm of the ascending aorta 41 mm.  Mild dilatation at the level of the sinuses of Valsalva 40 mm. Sinus bradycardia. Hypertension. Hyperlipidemia. Lipid panel 05/27/2022: LDL 84, HDL 60, TG 135, total 165.   History of Present Illness    Louis Perez is a longtime patient of cardiology followed by Dr. Rennis Golden for the above outlined history.  Patient was last seen in the office by Dr. Rennis Golden on 04/06/2023 with complaints of labile blood pressure with some readings in the 200s but mostly SBP 150-160.  There was some concern for sleep apnea secondary to wife's description of snoring and  patient report of difficulty staying asleep and daytime somnolence.  ITMAR sleep monitor ordered and patient was referred to Dr. Duke Salvia in hypertension clinic.  Most recently, patient contacted triage on 04/15/2023 regarding chest tightness, DOE and fatigue: "Patient states pressure in chest when walking or incline, "it is tight".  Usually does 3-4 miles a day and plays golf. Now walking up incline gets out of breath,and tired. States even going up the ramp to his door, he is out of breath.   He states this is the same feeling when he had a blockage in the past (approx 7 yrs ago) This has been for approx a week. BP is good. Not needed hydralazine or nitro.  Does get dizzy at times with standing up, but he stands and waits before moving and it resolves. He is just concerned and states "this is not like me,  I am just so out of breath"  Sleep study not scheduled yet."  BP readings Saturday 113/88 Sunday  92/64   92/64 Monday 145/90  104/77 Tuesday 104/65  106/72  Per Dr. Rennis Golden: "Blood pressure again appears labile - there are  number of low blood pressures, which may be an issue - he could try to cut back the hydralazine to twice daily.  Sounds like is concerned about a blockage - his last stress test was in 2020. He had cath in 2017.  He has prior stents. Will be difficult to assess non-invasively.  Would likely  recommend a repeat cath.  Would need to be seen sooner to discuss risk/benefits of the procedure and to get consent by either me or APP."  Today, patient is accompanied by his wife.  He reports a 2-week history of dyspnea with exertion particularly with walking inclines.  Over the last month he has also noticed some vague substernal chest pain that does not last long enough to take NTG.  He also has some right-sided shoulder arthritis that causes pain starting at his neck and going to his right jaw.  None of the symptoms are anginal equivalent.  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  This week he did take a couple walks and did some work on building for one of his children.  He did not get particularly short of breath but has noticed an immense amount of fatigue.  He denies lower extremity edema, orthopnea, PND.  No palpitations.    ROS: All other systems reviewed and are otherwise negative except as noted in History of Present Illness.  Studies Reviewed    ECG personally reviewed by me today: Sinus bradycardia, 58 bpm.  No significant changes from 04/06/2023.  Risk Assessment/Calculations        STOP-Bang Score:  4       Physical Exam    VS:  BP 138/80   Pulse (!) 58   Ht 6\' 3"  (1.905 m)   Wt 216 lb 9.6 oz (98.2 kg)   SpO2 95%   BMI 27.07 kg/m  , BMI Body mass index is 27.07 kg/m.  GEN: Well nourished, well developed, in no acute distress. Neck: No JVD or carotid bruits. Cardiac:  RRR. No murmurs. No rubs or gallops.   Respiratory:  Respirations regular and unlabored. Clear to auscultation without rales, wheezing or rhonchi. GI: Soft, nontender, nondistended. Extremities: Radials/DP/PT 2+ and equal bilaterally. No clubbing or cyanosis. No edema.  Skin: Warm and dry, no rash. Neuro: Strength intact.  Assessment & Plan    CAD/DOE/fatigue.  S/p PCI with DES to ostial LAD March 2017.  LHC June 2017 showed progressive 80% ostial ramus intermedius stenosis which appeared jailed by the very proximal stent strut extending into the left main from the LAD ostium.  FFR June 2017 showed patent stent and moderate to severe stenosis of the ramus intermedius with negative pressure wire analysis.  Normal, low risk nuclear stress test December 2020.  Patient reports a 2-week history of increasing DOE particularly with walking inclines and significant fatigue.  Patient is very active walking 4 miles 5 days a week, performing handiwork/construction for family members, and golfing.  He has been  trying to complete his normal activities but has noted the shortness of breath and significant decrease in energy.  The symptoms are not his anginal equivalent but are highly concerning to him.  He also endorses vague substernal chest pain that does not last long enough to take nitro.  Will schedule left heart catheterization.  Continue aspirin, carvedilol, isosorbide, as needed SL NTG.  Provided ED precautions. Hypertension. BP today 138/80.  He reports stopping hydralazine.  He feels his BP is under much better control in the last week.  Patient denies headaches, dizziness or vision changes. Continue carvedilol, hydralazine, isosorbide, olmesartan.  Keep scheduled visit with hypertension clinic.  Disposition: LHC.  CBC and BMP today.  Keep scheduled visit with hypertension clinic.  Follow-up after catheterization.     Shared Decision  Making/Informed Consent The risks [stroke (1 in 1000), death (1 in 1000), kidney failure [usually temporary] (1 in 500), bleeding (1 in 200), allergic reaction [possibly serious] (1 in 200)], benefits (diagnostic support and management of coronary artery disease) and alternatives of a cardiac catheterization were discussed in detail with Louis Perez and he is willing to proceed.   Signed, Etta Grandchild. Michaele Amundson, DNP, NP-C

## 2023-04-21 NOTE — Progress Notes (Signed)
Cardiology Clinic Note   Date: 04/23/2023 ID: Louis Perez, DOB [birth year], MRN [id number]  Primary Cardiologist:  Chrystie Nose, MD  Patient Profile    Louis Perez is a 72 y.o. male who presents to the clinic today for DOE and vague chest pain, possible LHC.  Past medical history significant for: CAD. LHC 03/01/2016 (NSTEMI): Mid RCA 25%.  Distal RCA 30%.  Ostial RPDA 30%.  Ostial ramus to ramus 25%.  Distal LAD 50%.  Ostial to proximal CX 25%.  Ostial LAD 95%.  PCI with DES to ostial LAD. LHC 05/14/2016 (angina): Ramus 80%.  Distal RCA 20%.  Widely patent ostial LAD stent.  Progressive 80% ostial ramus intermedius stenosis which appears to be jailed by the very proximal stent strut extending into the left main from the LAD ostium.  Concern that possible PTCA of the diagonal through the stent strut risks the dilated strut may interfere with the circumflex takeoff. LHC FFR study 06/17/2016 (rest chest pain): Proximal to mid RCA 25%.  Ostial LM 25%.  Ostial ramus 75%.  Mid to distal LAD 50%.  Ostial D2-D2 30%.  Continued patency of proximal LAD stent.  Moderate to severe stenosis of the ramus intermedius with negative pressure wire analysis.  Recommend medical therapy. Nuclear stress test 11/20/2019: Normal, low risk study. Ascending aortic aneurysm. Echo 04/11/2020: EF 60 to 65%.  Grade I DD.  Aneurysm of the ascending aorta 41 mm.  Mild dilatation at the level of the sinuses of Valsalva 40 mm. Sinus bradycardia. Hypertension. Hyperlipidemia. Lipid panel 05/27/2022: LDL 84, HDL 60, TG 135, total 165.   History of Present Illness    Louis Perez is a longtime patient of cardiology followed by Dr. Rennis Golden for the above outlined history.  Patient was last seen in the office by Dr. Rennis Golden on 04/06/2023 with complaints of labile blood pressure with some readings in the 200s but mostly SBP 150-160.  There was some concern for sleep apnea secondary to wife's description of snoring and  patient report of difficulty staying asleep and daytime somnolence.  Louis Perez sleep monitor ordered and patient was referred to Dr. Duke Salvia in hypertension clinic.  Most recently, patient contacted triage on 04/15/2023 regarding chest tightness, DOE and fatigue: "Patient states pressure in chest when walking or incline, "it is tight".  Usually does 3-4 miles a day and plays golf. Now walking up incline gets out of breath,and tired. States even going up the ramp to his door, he is out of breath.   He states this is the same feeling when he had a blockage in the past (approx 7 yrs ago) This has been for approx a week. BP is good. Not needed hydralazine or nitro.  Does get dizzy at times with standing up, but he stands and waits before moving and it resolves. He is just concerned and states "this is not like me,  I am just so out of breath"  Sleep study not scheduled yet."  BP readings Saturday 113/88 Sunday  92/64   92/64 Monday 145/90  104/77 Tuesday 104/65  106/72  Per Dr. Rennis Golden: "Blood pressure again appears labile - there are  number of low blood pressures, which may be an issue - he could try to cut back the hydralazine to twice daily.  Sounds like is concerned about a blockage - his last stress test was in 2020. He had cath in 2017.  He has prior stents. Will be difficult to assess non-invasively.  Would likely  recommend a repeat cath.  Would need to be seen sooner to discuss risk/benefits of the procedure and to get consent by either me or APP."  Today, patient is accompanied by his wife.  He reports a 2-week history of dyspnea with exertion particularly with walking inclines.  Over the last month he has also noticed some vague substernal chest pain that does not last long enough to take NTG.  He also has some right-sided shoulder arthritis that causes pain starting at his neck and going to his right jaw.  None of the symptoms are anginal equivalent.  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  This week he did take a couple walks and did some work on building for one of his children.  He did not get particularly short of breath but has noticed an immense amount of fatigue.  He denies lower extremity edema, orthopnea, PND.  No palpitations.    ROS: All other systems reviewed and are otherwise negative except as noted in History of Present Illness.  Studies Reviewed    ECG personally reviewed by me today: Sinus bradycardia, 58 bpm.  No significant changes from 04/06/2023.  Risk Assessment/Calculations        STOP-Bang Score:  4       Physical Exam    VS:  BP 138/80   Pulse (!) 58   Ht 6\' 3"  (1.905 m)   Wt 216 lb 9.6 oz (98.2 kg)   SpO2 95%   BMI 27.07 kg/m  , BMI Body mass index is 27.07 kg/m.  GEN: Well nourished, well developed, in no acute distress. Neck: No JVD or carotid bruits. Cardiac:  RRR. No murmurs. No rubs or gallops.   Respiratory:  Respirations regular and unlabored. Clear to auscultation without rales, wheezing or rhonchi. GI: Soft, nontender, nondistended. Extremities: Radials/DP/PT 2+ and equal bilaterally. No clubbing or cyanosis. No edema.  Skin: Warm and dry, no rash. Neuro: Strength intact.  Assessment & Plan    CAD/DOE/fatigue.  S/p PCI with DES to ostial LAD March 2017.  LHC June 2017 showed progressive 80% ostial ramus intermedius stenosis which appeared jailed by the very proximal stent strut extending into the left main from the LAD ostium.  FFR June 2017 showed patent stent and moderate to severe stenosis of the ramus intermedius with negative pressure wire analysis.  Normal, low risk nuclear stress test December 2020.  Patient reports a 2-week history of increasing DOE particularly with walking inclines and significant fatigue.  Patient is very active walking 4 miles 5 days a week, performing handiwork/construction for family members, and golfing.  He has been  trying to complete his normal activities but has noted the shortness of breath and significant decrease in energy.  The symptoms are not his anginal equivalent but are highly concerning to him.  He also endorses vague substernal chest pain that does not last long enough to take nitro.  Will schedule left heart catheterization.  Continue aspirin, carvedilol, isosorbide, as needed SL NTG.  Provided ED precautions. Hypertension. BP today 138/80.  He reports stopping hydralazine.  He feels his BP is under much better control in the last week.  Patient denies headaches, dizziness or vision changes. Continue carvedilol, hydralazine, isosorbide, olmesartan.  Keep scheduled visit with hypertension clinic.  Disposition: LHC.  CBC and BMP today.  Keep scheduled visit with hypertension clinic.  Follow-up after catheterization.     Shared Decision  Making/Informed Consent The risks [stroke (1 in 1000), death (1 in 1000), kidney failure [usually temporary] (1 in 500), bleeding (1 in 200), allergic reaction [possibly serious] (1 in 200)], benefits (diagnostic support and management of coronary artery disease) and alternatives of a cardiac catheterization were discussed in detail with Mr. Riede and he is willing to proceed.   Signed, Etta Grandchild. Rosmary Dionisio, DNP, NP-C

## 2023-04-23 ENCOUNTER — Encounter: Payer: Self-pay | Admitting: Student

## 2023-04-23 ENCOUNTER — Ambulatory Visit: Payer: Medicare Other | Attending: Student | Admitting: Student

## 2023-04-23 VITALS — BP 138/80 | HR 58 | Ht 75.0 in | Wt 216.6 lb

## 2023-04-23 DIAGNOSIS — R0609 Other forms of dyspnea: Secondary | ICD-10-CM | POA: Diagnosis not present

## 2023-04-23 DIAGNOSIS — R5383 Other fatigue: Secondary | ICD-10-CM

## 2023-04-23 DIAGNOSIS — I10 Essential (primary) hypertension: Secondary | ICD-10-CM | POA: Diagnosis not present

## 2023-04-23 DIAGNOSIS — I25118 Atherosclerotic heart disease of native coronary artery with other forms of angina pectoris: Secondary | ICD-10-CM | POA: Insufficient documentation

## 2023-04-23 MED ORDER — SODIUM CHLORIDE 0.9% FLUSH: 3.0000 mL | Freq: Two times a day (BID) | INTRAVENOUS | Status: DC

## 2023-04-23 NOTE — Patient Instructions (Signed)
Medication Instructions:  Your physician recommends that you continue on your current medications as directed. Please refer to the Current Medication list given to you today.  *If you need a refill on your cardiac medications before your next appointment, please call your pharmacy*   Lab Work: Your physician recommends that you have a CBC and BMET drawn today in office.  If you have labs (blood work) drawn today and your tests are completely normal, you will receive your results only by: MyChart Message (if you have MyChart) OR A paper copy in the mail If you have any lab test that is abnormal or we need to change your treatment, we will call you to review the results.   Testing/Procedures: Your physician has requested that you have a cardiac catheterization. Cardiac catheterization is used to diagnose and/or treat various heart conditions. Doctors may recommend this procedure for a number of different reasons. The most common reason is to evaluate chest pain. Chest pain can be a symptom of coronary artery disease (CAD), and cardiac catheterization can show whether plaque is narrowing or blocking your heart's arteries. This procedure is also used to evaluate the valves, as well as measure the blood flow and oxygen levels in different parts of your heart. For further information please visit [email or web address] Please follow instruction sheet, as given.      Follow-Up: At [facility], you and your health needs are our priority.  As part of our continuing mission to provide you with exceptional heart care, we have created designated Provider Care Teams.  These Care Teams include your primary Cardiologist (physician) and Advanced Practice Providers (APPs -  Physician Assistants and Nurse Practitioners) who all work together to provide you with the care you need, when you need it.  We recommend signing up for the patient portal called "MyChart".  Sign up information is provided on this  After Visit Summary.  MyChart is used to connect with patients for Virtual Visits (Telemedicine).  Patients are able to view lab/test results, encounter notes, upcoming appointments, etc.  Non-urgent messages can be sent to your provider as well.   To learn more about what you can do with MyChart, go to [email or web address].    Your next appointment:   2-3 week(s)  Provider:   Carlos Levering, Louis Perez   [facility] A DEPT OF . [facility] AT [facility] [id number] [place] [address] Dept: [phone] Loc: [phone]  Louis Perez  04/23/2023  You are scheduled for a Cardiac Catheterization on Wednesday, May 15 with Dr. Bryan Lemma.  1. Please arrive at the [facility] (Main Entrance A) at [facility]: [address] [place], [place] [address] at 8:00 AM (This time is 2 hour(s) before your procedure to ensure your preparation). Free valet parking service is available. You will check in at ADMITTING. The support person will be asked to wait in the waiting room.  It is OK to have someone drop you off and come back when you are ready to be discharged.    Special note: Every effort is made to have your procedure done on time. Please understand that emergencies sometimes delay scheduled procedures.  2. Diet: Do not eat solid foods after midnight.  The patient may have clear liquids until 5am upon the day of the procedure.  3. Labs:You will have your labs drawn today in office.   4. Medication instructions in preparation for your procedure:  On the morning of your procedure, take your Aspirin 81 mg and any morning medicines NOT listed above.  You may use sips of water.  5. Plan to go home the same day, you will only stay overnight if medically necessary. 6. Bring a current list of your medications and current insurance cards. 7. You MUST have a responsible person to drive you  home. 8. Someone MUST be with you the first 24 hours after you arrive home or your discharge will be delayed. 9. Please wear clothes that are easy to get on and off and wear slip-on shoes.  Thank you for allowing [place] to care for you!   -- [facility]

## 2023-04-24 LAB — CBC
Hematocrit: 45.9 % (ref 37.5–51.0)
Hemoglobin: 15.1 g/dL (ref 13.0–17.7)
MCH: 30.6 pg (ref 26.6–33.0)
MCHC: 32.9 g/dL (ref 31.5–35.7)
MCV: 93 fL (ref 79–97)
Platelets: 226 10*3/uL (ref 150–450)
RBC: 4.94 x10E6/uL (ref 4.14–5.80)
RDW: 12.8 % (ref 11.6–15.4)
WBC: 3.8 10*3/uL (ref 3.4–10.8)

## 2023-04-24 LAB — BASIC METABOLIC PANEL
BUN/Creatinine Ratio: 19 (ref 10–24)
BUN: 15 mg/dL (ref 8–27)
CO2: 27 mmol/L (ref 20–29)
Calcium: 9.4 mg/dL (ref 8.6–10.2)
Chloride: 101 mmol/L (ref 96–106)
Creatinine, Ser: 0.79 mg/dL (ref 0.76–1.27)
Glucose: 83 mg/dL (ref 70–99)
Potassium: 4.3 mmol/L (ref 3.5–5.2)
Sodium: 142 mmol/L (ref 134–144)
eGFR: 94 mL/min/{1.73_m2} (ref >59)

## 2023-04-26 ENCOUNTER — Telehealth: Payer: Self-pay | Admitting: Internal Medicine

## 2023-04-26 NOTE — Telephone Encounter (Signed)
LVM to return call to office

## 2023-04-26 NOTE — Telephone Encounter (Signed)
Wife is calling with some questions/concerns for patient upcoming appt. Please adise

## 2023-04-27 ENCOUNTER — Telehealth: Payer: Self-pay | Admitting: *Deleted

## 2023-04-27 ENCOUNTER — Telehealth: Payer: Self-pay

## 2023-04-27 NOTE — Telephone Encounter (Signed)
Cardiac Catheterization scheduled at [facility] for: Wednesday Apr 28, 2023 10 AM Arrival time [facility] Main Entrance A at: 8 AM  Nothing to eat after midnight prior to procedure, clear liquids until 5 AM day of procedure.  Medication instructions: -Usual morning medications can be taken with sips of water including aspirin 81 mg.  Confirmed patient has responsible adult to drive home post procedure and be with patient first 24 hours after arriving home.  Plan to go home the same day, you will only stay overnight if medically necessary.   Reviewed procedure instructions with patient.

## 2023-04-27 NOTE — Telephone Encounter (Signed)
Called and made the patient aware that HE may proceed with the [facility] Study. PIN # provided to the patient. Patient made aware that HE will be contacted after the test has been read with the results and any recommendations. Patient verbalized understanding and thanked me for the call.   

## 2023-04-28 ENCOUNTER — Other Ambulatory Visit: Payer: Self-pay

## 2023-04-28 ENCOUNTER — Encounter (HOSPITAL_COMMUNITY)
Admission: RE | Disposition: A | Discharge status: Home / Self Care | Payer: Self-pay | Source: Home / Self Care | Attending: Cardiology

## 2023-04-28 ENCOUNTER — Ambulatory Visit (HOSPITAL_COMMUNITY)
Admission: RE | Admit: 2023-04-28 | Discharge: 2023-04-28 | Disposition: A | Discharge status: Home / Self Care | Payer: Medicare Other | Attending: Cardiology | Admitting: Cardiology

## 2023-04-28 DIAGNOSIS — Z955 Presence of coronary angioplasty implant and graft: Secondary | ICD-10-CM | POA: Insufficient documentation

## 2023-04-28 DIAGNOSIS — Q245 Malformation of coronary vessels: Secondary | ICD-10-CM | POA: Diagnosis not present

## 2023-04-28 DIAGNOSIS — R0609 Other forms of dyspnea: Secondary | ICD-10-CM | POA: Insufficient documentation

## 2023-04-28 DIAGNOSIS — I25118 Atherosclerotic heart disease of native coronary artery with other forms of angina pectoris: Secondary | ICD-10-CM | POA: Insufficient documentation

## 2023-04-28 DIAGNOSIS — I10 Essential (primary) hypertension: Secondary | ICD-10-CM | POA: Insufficient documentation

## 2023-04-28 HISTORY — PX: LEFT HEART CATH AND CORONARY ANGIOGRAPHY: CATH118249

## 2023-04-28 SURGERY — LEFT HEART CATH AND CORONARY ANGIOGRAPHY
Anesthesia: LOCAL

## 2023-04-28 MED ORDER — SODIUM CHLORIDE 0.9% FLUSH: 3.0000 mL | INTRAVENOUS | Status: DC | PRN

## 2023-04-28 MED ORDER — MIDAZOLAM HCL 2 MG/2ML IJ SOLN
INTRAMUSCULAR | Status: AC
  Filled 2023-04-28: qty 2

## 2023-04-28 MED ORDER — ACETAMINOPHEN 325 MG PO TABS
650.0000 mg | ORAL_TABLET | ORAL | Status: DC | PRN
  Administered 2023-04-28: 650 mg via ORAL

## 2023-04-28 MED ORDER — LIDOCAINE HCL (PF) 1 % IJ SOLN
INTRAMUSCULAR | Status: AC
  Filled 2023-04-28: qty 30

## 2023-04-28 MED ORDER — FENTANYL CITRATE (PF) 100 MCG/2ML IJ SOLN
INTRAMUSCULAR | Status: AC
  Filled 2023-04-28: qty 2

## 2023-04-28 MED ORDER — HEPARIN SODIUM (PORCINE) 1000 UNIT/ML IJ SOLN
INTRAMUSCULAR | Status: AC
  Filled 2023-04-28: qty 10

## 2023-04-28 MED ORDER — ONDANSETRON HCL 4 MG/2ML IJ SOLN: 4.0000 mg | Freq: Four times a day (QID) | INTRAMUSCULAR | Status: DC | PRN

## 2023-04-28 MED ORDER — FENTANYL CITRATE (PF) 100 MCG/2ML IJ SOLN
INTRAMUSCULAR | Status: DC | PRN
  Administered 2023-04-28: 50 ug via INTRAVENOUS

## 2023-04-28 MED ORDER — SODIUM CHLORIDE 0.9 % IV SOLN: 250.0000 mL | INTRAVENOUS | Status: DC | PRN

## 2023-04-28 MED ORDER — LIDOCAINE HCL (PF) 1 % IJ SOLN
INTRAMUSCULAR | Status: DC | PRN
  Administered 2023-04-28: 2 mL

## 2023-04-28 MED ORDER — SODIUM CHLORIDE 0.9 % WEIGHT BASED INFUSION
3.0000 mL/kg/h | INTRAVENOUS | Status: AC
  Administered 2023-04-28: 3 mL/kg/h via INTRAVENOUS

## 2023-04-28 MED ORDER — LABETALOL HCL 5 MG/ML IV SOLN: 10.0000 mg | INTRAVENOUS | Status: DC | PRN

## 2023-04-28 MED ORDER — IOHEXOL 350 MG/ML SOLN
INTRAVENOUS | Status: DC | PRN
  Administered 2023-04-28: 70 mL

## 2023-04-28 MED ORDER — HYDRALAZINE HCL 20 MG/ML IJ SOLN: 10.0000 mg | INTRAMUSCULAR | Status: DC | PRN

## 2023-04-28 MED ORDER — MIDAZOLAM HCL 2 MG/2ML IJ SOLN
INTRAMUSCULAR | Status: DC | PRN
  Administered 2023-04-28: 1 mg via INTRAVENOUS

## 2023-04-28 MED ORDER — HEPARIN SODIUM (PORCINE) 1000 UNIT/ML IJ SOLN
INTRAMUSCULAR | Status: DC | PRN
  Administered 2023-04-28: 5000 [IU] via INTRAVENOUS

## 2023-04-28 MED ORDER — VERAPAMIL HCL 2.5 MG/ML IV SOLN
INTRAVENOUS | Status: AC
  Filled 2023-04-28: qty 2

## 2023-04-28 MED ORDER — VERAPAMIL HCL 2.5 MG/ML IV SOLN
INTRAVENOUS | Status: DC | PRN
  Administered 2023-04-28: 10 mL via INTRA_ARTERIAL

## 2023-04-28 MED ORDER — ASPIRIN 81 MG PO CHEW: 81.0000 mg | CHEWABLE_TABLET | ORAL | Status: DC

## 2023-04-28 MED ORDER — SODIUM CHLORIDE 0.9 % IV SOLN: INTRAVENOUS | Status: DC

## 2023-04-28 MED ORDER — ACETAMINOPHEN 325 MG PO TABS
ORAL_TABLET | ORAL | Status: AC
  Filled 2023-04-28: qty 2

## 2023-04-28 MED ORDER — SODIUM CHLORIDE 0.9 % WEIGHT BASED INFUSION: 1.0000 mL/kg/h | INTRAVENOUS | Status: DC

## 2023-04-28 MED ORDER — SODIUM CHLORIDE 0.9% FLUSH: 3.0000 mL | Freq: Two times a day (BID) | INTRAVENOUS | Status: DC

## 2023-04-28 MED ORDER — HEPARIN (PORCINE) IN NACL 1000-0.9 UT/500ML-% IV SOLN
INTRAVENOUS | Status: DC | PRN
  Administered 2023-04-28 (×2): 500 mL

## 2023-04-28 SURGICAL SUPPLY — 10 items
CATH OPTITORQUE TIG 4.0 5F (CATHETERS) IMPLANT
DEVICE RAD COMP TR BAND LRG (VASCULAR PRODUCTS) IMPLANT
GLIDESHEATH SLEND SS 6F .021 (SHEATH) IMPLANT
GUIDEWIRE INQWIRE 1.5J.035X260 (WIRE) IMPLANT
INQWIRE 1.5J .035X260CM (WIRE) ×1
KIT HEART LEFT (KITS) ×1 IMPLANT
PACK CARDIAC CATHETERIZATION (CUSTOM PROCEDURE TRAY) ×1 IMPLANT
SHEATH PROBE COVER 6X72 (BAG) IMPLANT
TRANSDUCER W/STOPCOCK (MISCELLANEOUS) ×1 IMPLANT
TUBING CIL FLEX 10 FLL-RA (TUBING) ×1 IMPLANT

## 2023-04-28 NOTE — Interval H&P Note (Signed)
History and Physical Interval Note:  04/28/2023 11:26 AM  Louis Perez  has presented today for surgery, with the diagnosis of CAD native native with other angina.  The various methods of treatment have been discussed with the patient and family. After consideration of risks, benefits and other options for treatment, the patient has consented to  Procedure(s): LEFT HEART CATH AND CORONARY ANGIOGRAPHY (N/A) PERCUTANEOUS CORONARY INTERVENTION    as a surgical intervention.  The patient's history has been reviewed, patient examined, no change in status, stable for surgery.  I have reviewed the patient's chart and labs.  Questions were answered to the patient's satisfaction.    Cath Lab Visit (complete for each Cath Lab visit)  Clinical Evaluation Leading to the Procedure:   ACS: No.  Non-ACS:    Anginal Classification: CCS III  Anti-ischemic medical therapy: Maximal Therapy (2 or more classes of medications)  Non-Invasive Test Results: No non-invasive testing performed  Prior CABG: No previous CABG     Bryan Lemma

## 2023-04-29 ENCOUNTER — Encounter (HOSPITAL_COMMUNITY): Payer: Self-pay | Admitting: Cardiology

## 2023-04-30 NOTE — Telephone Encounter (Signed)
Call to patient and LVM to please call office.

## 2023-05-04 NOTE — Telephone Encounter (Signed)
LVM that we have tried to contact patient.  If any questions please call office, or they can be answered at patient appt for June 7th.

## 2023-05-06 ENCOUNTER — Ambulatory Visit (INDEPENDENT_AMBULATORY_CARE_PROVIDER_SITE_OTHER): Payer: Medicare Other

## 2023-05-06 DIAGNOSIS — J4550 Severe persistent asthma, uncomplicated: Secondary | ICD-10-CM | POA: Diagnosis not present

## 2023-05-16 NOTE — Progress Notes (Deleted)
Cardiology Clinic Note   Date: 05/16/2023 ID: INMAN KOSIN, DOB 1951-12-07, MRN [id number]  Primary Cardiologist:  Chrystie Nose, MD  Patient Profile    Louis Perez is a 72 y.o. male who presents to the clinic today for ***  Past medical history significant for: CAD. LHC 03/01/2016 (NSTEMI): Mid RCA 25%.  Distal RCA 30%.  Ostial RPDA 30%.  Ostial ramus to ramus 25%.  Distal LAD 50%.  Ostial to proximal CX 25%.  Ostial LAD 95%.  PCI with DES to ostial LAD. LHC 05/14/2016 (angina): Ramus 80%.  Distal RCA 20%.  Widely patent ostial LAD stent.  Progressive 80% ostial ramus intermedius stenosis which appears to be jailed by the very proximal stent strut extending into the left main from the LAD ostium.  Concern that possible PTCA of the diagonal through the stent strut risks the dilated strut may interfere with the circumflex takeoff. LHC FFR study 06/17/2016 (rest chest pain): Proximal to mid RCA 25%.  Ostial LM 25%.  Ostial ramus 75%.  Mid to distal LAD 50%.  Ostial D2-D2 30%.  Continued patency of proximal LAD stent.  Moderate to severe stenosis of the ramus intermedius with negative pressure wire analysis.  Recommend medical therapy. LHC 04/28/2023 (fatigue/angina): Three-vessel disease with widely patent LAD stent, improved/stabilization of ostial RI lesion when compared to prior catheterization 2017.  Ostial RPDA 55%. Ascending aortic aneurysm. Echo 04/11/2020: EF 60 to 65%.  Grade I DD.  Aneurysm of the ascending aorta 41 mm.  Mild dilatation at the level of the sinuses of Valsalva 40 mm. Sinus bradycardia. Hypertension. Hyperlipidemia. Lipid panel 05/27/2022: LDL 84, HDL 60, TG 135, total 165.   History of Present Illness    Louis Perez is a longtime patient of cardiology followed by Dr. Rennis Golden for the above outlined history.   Patient was last seen in the office by me on 04/23/2023 with a 2-week history of dyspnea with exertion particularly with walking inclines and vague  substernal chest pain that was too brief to try as needed NTG.  He had been able to perform most of his normal activities but noted a significant amount of fatigue.  He underwent LHC which showed patent stent and stable disease.    Patient was evaluated by hypertension clinic on 05/20/2023 with Gillian Shields, NP.***  Today, patient ***  Cholesterol medication?    CAD/DOE/fatigue.  S/p PCI with DES to ostial LAD March 2017.  Normal, low risk nuclear stress test December 2020.  LHC May 2024 showed three-vessel disease with widely patent LAD stent and otherwise stable disease.  Patient*** Continue aspirin, carvedilol, isosorbide, as needed SL NTG.   Hypertension. BP today***.  Office visit with hypertension clinic 1 day ago.*** Hyperlipidemia.*** Ascending aortic aneurysm.***   ROS: All other systems reviewed and are otherwise negative except as noted in History of Present Illness.  Studies Reviewed    ECG personally reviewed by me today: ***  No significant changes from ***  Risk Assessment/Calculations    {Does this patient have ATRIAL FIBRILLATION?:[phone]} No BP recorded.  {Refresh Note OR Click here to enter BP  :1}***   STOP-Bang Score:  4  { Consider Dx Sleep Disordered Breathing or Sleep Apnea  ICD G47.33          :1}     Physical Exam    VS:  There were no vitals taken for this visit. , BMI There is no height or weight on file to calculate BMI.  GEN:  Well nourished, well developed, in no acute distress. Neck: No JVD or carotid bruits. Cardiac: *** RRR. No murmurs. No rubs or gallops.   Respiratory:  Respirations regular and unlabored. Clear to auscultation without rales, wheezing or rhonchi. GI: Soft, nontender, nondistended. Extremities: Radials/DP/PT 2+ and equal bilaterally. No clubbing or cyanosis. No edema ***  Skin: Warm and dry, no rash. Neuro: Strength intact.  Assessment & Plan   ***  Disposition: ***     {Are you ordering a CV Procedure (e.g. stress  test, cath, DCCV, TEE, etc)?   Press F2        :[id number]}   Signed, Etta Grandchild. Karlina Suares, DNP, NP-C

## 2023-05-17 ENCOUNTER — Encounter: Payer: Medicare Other | Admitting: *Deleted

## 2023-05-17 VITALS — BP 137/87 | HR 55 | Resp 16 | Wt 207.0 lb

## 2023-05-17 DIAGNOSIS — Z006 Encounter for examination for normal comparison and control in clinical research program: Secondary | ICD-10-CM

## 2023-05-17 MED ORDER — STUDY - ORION 4 - INCLISIRAN 300 MG/1.5 ML OR PLACEBO SQ INJECTION (PI-STUCKEY)
300.0000 mg | INJECTION | SUBCUTANEOUS | Status: DC
  Administered 2023-05-17: 300 mg via SUBCUTANEOUS
  Filled 2023-05-17: qty 1.5

## 2023-05-17 NOTE — Research (Signed)
Orion 4  Patient feeling ok but having some issues with blood pressures.  Was taking Hydralazine 25 mg daily but not taking that anymore. BP has been running lower.  No chest pains Shortness of breath comes and goes - relating to his asthma  Had a left heart cath outpatient and it was clear with no new blockage; the old blockage was better from 70-40% blockage. Stent is still open - per patient.  Meds reviewed with what he is taking   Injection given 0913 right upper abdomen   Next follow up 11/16/2023 @ 0830  Mercer Pod :) RN BSN  Clinical Research Nurse  Be strong and take heart, all you who hope in the [place]. ~ Psalm 31:24

## 2023-05-20 ENCOUNTER — Encounter (HOSPITAL_BASED_OUTPATIENT_CLINIC_OR_DEPARTMENT_OTHER): Payer: Self-pay | Admitting: Family

## 2023-05-20 ENCOUNTER — Ambulatory Visit (INDEPENDENT_AMBULATORY_CARE_PROVIDER_SITE_OTHER): Payer: Medicare Other | Admitting: Family

## 2023-05-20 VITALS — BP 122/70 | HR 56 | Ht 75.0 in | Wt 207.9 lb

## 2023-05-20 DIAGNOSIS — E785 Hyperlipidemia, unspecified: Secondary | ICD-10-CM

## 2023-05-20 DIAGNOSIS — I10 Essential (primary) hypertension: Secondary | ICD-10-CM | POA: Diagnosis not present

## 2023-05-20 DIAGNOSIS — I25118 Atherosclerotic heart disease of native coronary artery with other forms of angina pectoris: Secondary | ICD-10-CM | POA: Diagnosis not present

## 2023-05-20 MED ORDER — ISOSORBIDE MONONITRATE ER 60 MG PO TB24
60.0000 mg | ORAL_TABLET | Freq: Every day | ORAL | 3 refills | Status: AC
Start: 2023-05-20 — End: ?

## 2023-05-20 MED ORDER — CARVEDILOL 6.25 MG PO TABS
6.2500 mg | ORAL_TABLET | Freq: Two times a day (BID) | ORAL | 3 refills | Status: DC
Start: 2023-05-20 — End: 2024-06-06

## 2023-05-20 MED ORDER — OLMESARTAN MEDOXOMIL 20 MG PO TABS
20.0000 mg | ORAL_TABLET | Freq: Two times a day (BID) | ORAL | 3 refills | Status: DC
Start: 2023-05-20 — End: 2023-05-20

## 2023-05-20 MED ORDER — OLMESARTAN MEDOXOMIL 20 MG PO TABS
20.0000 mg | ORAL_TABLET | Freq: Two times a day (BID) | ORAL | 3 refills | Status: DC
Start: 2023-05-20 — End: 2023-07-22

## 2023-05-20 NOTE — Progress Notes (Signed)
Advanced Hypertension Clinic Initial Assessment:    Date:  05/20/2023   ID:  Louis Perez, DOB [birth year], MRN [id number]  PCP:  [facility]  Cardiologist:  Chrystie Nose, MD  Nephrologist:  Referring MD: Chrystie Nose, MD   CC: Hypertension  History of Present Illness:    Louis Perez is a 72 y.o. male with a hx of hypertension, CAD (2017 NSTEMI with PCI-DES to ostial LAD), HLD, ascending aortic aneurysm, sinus bradycardia here to establish care in the Advanced Hypertension Clinic. 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  Seen by Dr. Rennis Golden 04/06/23 noting labile blood pressure including hypotension recommended for sleep study and referral to Advanced Hypertension Clinic. Subsequently evaluated by Tommi Rumps 04/23/23 noting exertional dyspnea and chest pain.  LHC performed 5/1 concerning for three-vessel disease with widely patent LAD stent, improved stabilization of vascular RI lesion when compared to prior, ostial PDA 55% with normal LVEF 55 to 60%.  Recommended to consider nonanginal/noncardiac etiology for dyspnea.  Louis Perez was diagnosed with hypertension in his 74s. It has been difficult to control. Blood pressure checked with arm cuff at home. Readings have been often 110s/60s. Has had some hypotensive readings. he reports tobacco use previously having quit in 1970. Alcohol use only socially. For exercise he plays golf on Wednesdays and walks - walked 5 miles this morning. he eats at home and outside of the home and does follow low sodium diet.   Has not yet done sleep study.   Itamar sleep study  Taking morning medicine 7:30a-8:30a and at night 6:30p-8p  Imdur in the morning   Two diet Dr. Reino Kent per day.   Previous antihypertensives: Losartan-HCTZ Hydralazine  Secondary Causes of Hypertension  Medications/Herbal: OCP, steroids, stimulants, antidepressants, weight loss medication, immune  suppressants, NSAIDs, sympathomimetics, alcohol, caffeine, licorice, ginseng, St. John's wort, chemo  Sleep Apnea Renal artery stenosis - 03/2022 no renal artery stenosis  Hyperaldosteronism Hyper/hypothyroidism - 03/26/22 normal TSH Pheochromocytoma: palpitations, tachycardia, headache, diaphoresis (plasma metanephrines) Coarctation of the aorta  Past Medical History:  Diagnosis Date   Arthritis    "hips, lower back, knees, shoulders, hands" (05/14/2016)   Asthma dx'd in 2014   Bacterial septicemia (HCC) 01/2011   "both knee involved"   CAD (coronary artery disease)    a. NSTEMI 02/2016 s/p DES to LAD. b. Relook cath due to angina/prior high risk anatomy 05/14/16 -> continued medical therapy given concern that a stent strut could inferere with Cx itself.   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    NSTEMI (non-ST elevated myocardial infarction) (HCC) 02/2016   [name] 03/02/2016   Recurrent upper respiratory infection (URI)    Sinus bradycardia    Unspecified tinnitus    right    Past Surgical History:  Procedure Laterality Date   BACK SURGERY     CARDIAC CATHETERIZATION N/A 03/01/2016   Procedure: Left Heart Cath and Coronary Angiography;  Surgeon: Tonny Bollman, MD;  oLAD 95%, CFX/RI/RCA/RPDA 25-30%   CARDIAC CATHETERIZATION N/A 03/01/2016   Procedure: Coronary Stent Intervention;  Surgeon: Tonny Bollman, MD;  Promus Premier 4.0 x  12 mm DES to oLAD   CARDIAC CATHETERIZATION N/A 05/14/2016   Procedure: Left Heart Cath and Coronary Angiography;  Surgeon: Lennette Bihari, MD;  Location: [facility] INVASIVE CV LAB;  Service: Cardiovascular;  Laterality: N/A;   CARDIAC CATHETERIZATION  N/A 06/17/2016   Procedure: Intravascular Pressure Wire/FFR Study;  Surgeon: Tonny Bollman, MD;  Location: [facility] INVASIVE CV LAB;  Service: Cardiovascular;  Laterality: N/A;   CARDIOVASCULAR STRESS TEST  05/26/2012   EKG  negative for ischemia, no ECG changes, no significant ischemia noted   I & D KNEE WITH POLY EXCHANGE Bilateral 01/2011   & complete synovectomy of 3 compartments of the right total knee/notes 02/04/2011 (replacement,partial knee replacement because of bacterial infection)   JOINT REPLACEMENT     KNEE ARTHROSCOPY Right ~ 2002   LEFT HEART CATH AND CORONARY ANGIOGRAPHY N/A 04/28/2023   Procedure: LEFT HEART CATH AND CORONARY ANGIOGRAPHY;  Surgeon: Marykay Lex, MD;  Location: [facility] INVASIVE CV LAB;  Service: Cardiovascular;  Laterality: N/A;   LUMBAR DISC SURGERY  ~ 1980; 2000   NASAL SINUS SURGERY  08/2011   SHOULDER ARTHROSCOPY W/ ROTATOR CUFF REPAIR Right 01/2015   TOTAL KNEE ARTHROPLASTY Bilateral 03/26/2004-09/15/2004    right-left   TRANSTHORACIC ECHOCARDIOGRAM  05/26/2012   EF >55%, mild concentric LVH    Current Medications: Current Meds  Medication Sig   albuterol (PROVENTIL) (2.5 MG/3ML) 0.083% nebulizer solution Take 3 mLs (2.5 mg total) by nebulization every 4 (four) hours as needed for wheezing or shortness of breath.   albuterol (VENTOLIN HFA) 108 (90 Base) MCG/ACT inhaler Inhale 2 puffs into the lungs every 4 (four) hours as needed for wheezing or shortness of breath.   aspirin 81 MG tablet Take 81 mg by mouth at bedtime.    Aspirin-Acetaminophen-Caffeine (GOODY HEADACHE PO) Take 1 packet by mouth daily as needed (pain).   Budeson-Glycopyrrol-Formoterol (BREZTRI AEROSPHERE) 160-9-4.8 MCG/ACT AERO Inhale 2 puffs into the lungs in the morning and at bedtime.   Budeson-Glycopyrrol-Formoterol (BREZTRI AEROSPHERE) 160-9-4.8 MCG/ACT AERO Inhale into the lungs.   clotrimazole (MYCELEX) 10 MG troche Take 1 tablet (10 mg total) by mouth 2 (two) times daily. Use after taking Breztri   famotidine (PEPCID) 20 MG tablet Take 2 tablets (40 mg total) by mouth at bedtime. (Patient taking differently: Take 20 mg by mouth at bedtime.)   ipratropium (ATROVENT) 0.06 % nasal spray Place 2 sprays into the  nose 4 (four) times daily as needed for rhinitis.   levocetirizine (XYZAL) 5 MG tablet Take 1 tablet (5 mg total) by mouth 2 (two) times daily as needed (Can take an extra dose during flare ups.). (Patient taking differently: Take 5 mg by mouth in the morning and at bedtime.)   Multiple Vitamin (MULTIVITAMIN WITH MINERALS) TABS tablet Take 1 tablet by mouth daily.   naproxen sodium (ALEVE) 220 MG tablet Take 220 mg by mouth daily as needed (pain).   nitroGLYCERIN (NITROSTAT) 0.4 MG SL tablet Place 1 tablet (0.4 mg total) under the tongue every 5 (five) minutes as needed for chest pain.   pantoprazole (PROTONIX) 40 MG tablet Take 1 tablet (40 mg total) by mouth 2 (two) times daily.   Spacer/Aero-Holding Chambers DEVI 1 Device by Does not apply route as directed.   Study - ORION 4 - inclisiran 300 mg/1.48mL or placebo SQ injection (PI-Stuckey) Inject 300 mg into the skin every 6 (six) months. For Investigational Use Only. Please alert research coordinator of change/addition of any lipid lowering agent or lipid panels drawn per research protocol. ORION 4 STUDY MEDICATION - inclisiran 300 mg/1.5 mL or placebo SQ injection (PI-Stuckey)   [DISCONTINUED] carvedilol (COREG) 6.25 MG tablet Take 1 tablet by mouth twice daily   [DISCONTINUED] cetirizine (ZYRTEC) 10 MG tablet Take 1  tablet (10 mg total) by mouth daily as needed for allergies (Can take an extra dose during flare ups.). (Patient taking differently: Take 10 mg by mouth at bedtime.)   [DISCONTINUED] isosorbide mononitrate (IMDUR) 60 MG 24 hr tablet Take 1 tablet (60 mg total) by mouth daily. KEEP OV.   [DISCONTINUED] olmesartan (BENICAR) 40 MG tablet Take 1 tablet by mouth once daily (Patient taking differently: Take 20 mg by mouth in the morning and at bedtime.)   Current Facility-Administered Medications for the 05/20/23 encounter (Office Visit) with Alver Sorrow, NP  Medication   Study - ORION 4 - inclisiran 300 mg/1.3mL or placebo SQ injection  (PI-Stuckey)   tezepelumab-ekko (TEZSPIRE) 210 MG/1. syringe 210 mg     Allergies:   Oxycodone and Ambien [zolpidem tartrate]   Social History   Socioeconomic History   Marital status: Married    Spouse name: Not on file   Number of children: 2   Years of education: 14   Highest education level: Not on file  Occupational History   Occupation: retired Architect, part-time security work  Tobacco Use   Smoking status: Former    Packs/day: 1.00    Years: 5.00    Additional pack years: 0.00    Total pack years: 5.00    Types: Cigarettes    Quit date: 12/14/1968    Years since quitting: 54.4   Smokeless tobacco: Never  Vaping Use   Vaping Use: Never used  Substance and Sexual Activity   Alcohol use: Yes    Alcohol/week: 1.0 standard drink of alcohol    Types: 1 Cans of beer per week    Comment: Occasional   Drug use: No   Sexual activity: Not Currently    Partners: Female    Comment: Married  Other Topics Concern   Not on file  Social History Narrative   Patient lives at home with his wife . He is retired. Patient has 14 years of education. Patient has two children.   Caffeine - two cups daily.   Right-handed   Social Determinants of Health   Financial Resource Strain: Low Risk  (05/20/2023)   Overall Financial Resource Strain (CARDIA)    Difficulty of Paying Living Expenses: Not hard at all  Food Insecurity: No Food Insecurity (05/20/2023)   Hunger Vital Sign    Worried About Running Out of Food in the Last Year: Never true    Ran Out of Food in the Last Year: Never true  Transportation Needs: No Transportation Needs (05/20/2023)   PRAPARE - Administrator, Civil Service (Medical): No    Lack of Transportation (Non-Medical): No  Physical Activity: Sufficiently Active (05/20/2023)   Exercise Vital Sign    Days of Exercise per Week: 7 days    Minutes of Exercise per Session: 30 min  Stress: No Stress Concern Present (05/20/2023)   [employer] of  Occupational Health - Occupational Stress Questionnaire    Feeling of Stress : Not at all  Social Connections: Socially Integrated (05/20/2023)   Social Connection and Isolation Panel [NHANES]    Frequency of Communication with Friends and Family: More than three times a week    Frequency of Social Gatherings with Friends and Family: More than three times a week    Attends Religious Services: More than 4 times per year    Active Member of [employer] or Organizations: Yes    Attends Banker Meetings: More than 4 times per year  Marital Status: Married     Family History: The patient's family history includes Alzheimer's disease in his father; Cancer in his paternal aunt, paternal uncle, paternal uncle, and paternal uncle; Dementia in his father; Heart disease in his mother; Heart disease (age of onset: 18) in his brother.  ROS:   Please see the history of present illness.    All other systems reviewed and are negative.  EKGs/Labs/Other Studies Reviewed:    EKG:  EKG is not ordered today.  Recent Labs: 04/23/2023: BUN 15; Creatinine, Ser 0.79; Hemoglobin 15.1; Platelets 226; Potassium 4.3; Sodium 142   Recent Lipid Panel    Component Value Date/Time   CHOL 173 06/26/2020 0927   CHOL 136 01/08/2017 0833   TRIG 103 06/26/2020 0927   TRIG 109 01/08/2017 0833   HDL 54 06/26/2020 0927   HDL 53 01/08/2017 0833   CHOLHDL 3.2 06/26/2020 0927   CHOLHDL 2.6 01/08/2017 0833   CHOLHDL 3.0 03/01/2016 0448   VLDL 17 03/01/2016 0448   LDLCALC 100 (H) 06/26/2020 0927   LDLCALC 64 01/08/2017 0833    Physical Exam:   VS:  BP 122/70 (BP Location: Right Arm)   Pulse (!) 56   Ht 6\' 3"  (1.905 m)   Wt 207 lb 13.9 oz (94.3 kg)   BMI 25.98 kg/m  , BMI Body mass index is 25.98 kg/m. GENERAL:  Well appearing HEENT: Pupils equal round and reactive, fundi not visualized, oral mucosa unremarkable NECK:  No jugular venous distention, waveform within normal limits, carotid upstroke brisk  and symmetric, no bruits, no thyromegaly LYMPHATICS:  No cervical adenopathy LUNGS:  Clear to auscultation bilaterally HEART:  RRR.  PMI not displaced or sustained,S1 and S2 within normal limits, no S3, no S4, no clicks, no rubs, no murmurs ABD:  Flat, positive bowel sounds normal in frequency in pitch, no bruits, no rebound, no guarding, no midline pulsatile mass, no hepatomegaly, no splenomegaly EXT:  2 plus pulses throughout, no edema, no cyanosis no clubbing SKIN:  No rashes no nodules. Right radial cath site healed appropriately with no hematoma, ecchymosis, nor evidence of infection. NEURO:  Cranial nerves II through XII grossly intact, motor grossly intact throughout PSYCH:  Cognitively intact, oriented to person place and time   ASSESSMENT/PLAN:    HTN -longstanding history of hypertension having been diagnosed in his 15s.  BP has been labile with symptomatic hypotension.  He will send blood pressure readings via MyChart.  BP well-controlled in clinic today and will continue present regimen. Olmesartan 20mg  BID, Carvedilol 6.25mg  BID, Imdur 60mg  QD.  Refills provided.  Secondary workup, as below.  Pending his home blood pressures and secondary workup may consider adjustment of regimen is taking medications 12 hours apart can be somewhat difficult.  Future considerations include Bystolic in place of carvedilol. BP symmetrical, no indication for carotid duplex nor evidence of aortic coarctation.  Renal duplex 03/2022 no stenosis Plan for metanephrines, catecholamines, TSH. Consider renin-aldosterone at follow-up.  Hesitant to hold olmesartan at this time Plan for sleep study.   Snores - Encouraged to complete home sleep study. Already has Itamar home sleep study.   Coronary artery disease/hyperlipidemia, LDL goal less than 70- LHC 04/28/23 patent stent and otherwise nonobstructive disease.  Right radial catheterization site healed appropriately with no hematoma, signs of infection, bruising.   Dyspnea non cardiac. Exercised this morning by walking 5 miles without issue. GDMT aspirin, carvedilol, imdur. Prior statin intolerance, participating in [place] lipid trial and as such lipid results are  blinded. Heart healthy diet and regular cardiovascular exercise encouraged.    Screening for Secondary Hypertension:     05/20/2023   12:43 PM  Causes  Drugs/Herbals Screened     - Comments 05/20/23 recommend decreased caffeine intake  Renovascular HTN Screened     - Comments renal duplex 03/2023 no stenosis  Sleep Apnea Screened  Thyroid Disease Screened  Pheochromocytoma Screened     - Comments 05/2023 metanephrines, catecholamines  Cushing's Syndrome N/A     - Comments non cushingoid appearance  Coarctation of the Aorta N/A     - Comments symmetrical BP  Compliance Screened     - Comments Takes medications regularly    Relevant Labs/Studies:    Latest Ref Rng & Units 04/23/2023    3:34 PM 03/26/2022    3:07 PM 06/08/2019    3:03 PM  Basic Labs  Sodium 134 - 144 mmol/L 142  143  143   Potassium 3.5 - 5.2 mmol/L 4.3  4.5  4.3   Creatinine 0.76 - 1.27 mg/dL 0.86  5.78  4.69        Latest Ref Rng & Units 03/26/2022    3:07 PM  Thyroid   TSH 0.450 - 4.500 uIU/mL 1.080                 04/03/2022    8:41 AM  Renovascular   Renal Artery [place] Completed Yes       Disposition:    FU with MD/APP/PharmD in 2 months    Medication Adjustments/Labs and Tests Ordered: Current medicines are reviewed at length with the patient today.  Concerns regarding medicines are outlined above.  Orders Placed This Encounter  Procedures   Catecholamines, fractionated, plasma   TSH   Metanephrines, plasma   Meds ordered this encounter  Medications   carvedilol (COREG) 6.25 MG tablet    Sig: Take 1 tablet (6.25 mg total) by mouth 2 (two) times daily.    Dispense:  180 tablet    Refill:  3   isosorbide mononitrate (IMDUR) 60 MG 24 hr tablet    Sig: Take 1 tablet (60 mg total) by mouth daily.     Dispense:  90 tablet    Refill:  3   DISCONTD: olmesartan (BENICAR) 20 MG tablet    Sig: Take 1 tablet (20 mg total) by mouth 2 (two) times daily. Take 1 tablet by mouth once daily    Dispense:  180 tablet    Refill:  3   olmesartan (BENICAR) 20 MG tablet    Sig: Take 1 tablet (20 mg total) by mouth 2 (two) times daily.    Dispense:  180 tablet    Refill:  3     Signed, Alver Sorrow, NP  05/20/2023 12:47 PM    [facility]

## 2023-05-20 NOTE — Patient Instructions (Signed)
Medication Instructions:  Your physician recommends that you continue on your current medications as directed. Please refer to the Current Medication list given to you today.    Labwork: Your physician recommends that you return for lab work today- Metanephrines, catecholamines, and TSH    Testing/Procedures: WatchPAT?  Is a FDA cleared portable home sleep study test that uses a watch and 3 points of contact to monitor 7 different channels, including your heart rate, oxygen saturations, body position, snoring, and chest motion.  The study is easy to use from the comfort of your own home and accurately detect sleep apnea.  Before bed, you attach the chest sensor, attached the sleep apnea bracelet to your nondominant hand, and attach the finger probe.  After the study, the raw data is downloaded from the watch and scored for apnea events.   For more information: [email or web address]  Patient Testing Instructions:  Do not put battery into the device until bedtime when you are ready to begin the test. Please call the support number if you need assistance after following the instructions below: 24 hour support line- [phone] or ITAMAR support at [phone] (option 2)  Download the IntelWatchPAT One" app through the google play store or App Store  Be sure to turn on or enable access to bluetooth in settlings on your smartphone/ device  Make sure no other bluetooth devices are on and within the vicinity of your smartphone/ device and WatchPAT watch during testing.  Make sure to leave your smart phone/ device plugged in and charging all night.  When ready for bed:  Follow the instructions step by step in the WatchPAT One App to activate the testing device. For additional instructions, including video instruction, visit the WatchPAT One video on Youtube. You can search for WatchPat One within Youtube (video is 4 minutes and 18 seconds) or enter:  https://youtube/watch?[email or web address]_vbiwxE Please note: You will be prompted to enter a Pin to connect via bluetooth when starting the test. The PIN will be assigned to you when you receive the test.  The device is disposable, but it recommended that you retain the device until you receive a call letting you know the study has been received and the results have been interpreted.  We will let you know if the study did not transmit to [place] properly after the test is completed. You do not need to call us to confirm the receipt of the test.  Please complete the test within 48 hours of receiving PIN.   Frequently Asked Questions:  What is Watch Dennie Bible one?  A single use fully disposable home sleep apnea testing device and will not need to be returned after completion.  What are the requirements to use WatchPAT one?  The be able to have a successful watchpat one sleep study, you should have your Watch pat one device, your smart phone, watch pat one app, your PIN number and Internet access What type of phone do I need?  You should have a smart phone that uses Android 5.1 and above or any Iphone with IOS 10 and above How can I download the WatchPAT one app?  Based on your device type search for WatchPAT one app either in google play for android devices or APP store for Iphone's Where will I get my PIN for the study?  Your PIN will be provided by your physician's office. It is used for authentication and if you lose/forget your PIN, please reach out to your providers office.  I do not  have Internet at home. Can I do WatchPAT one study?  WatchPAT One needs Internet connection throughout the night to be able to transmit the sleep data. You can use your home/local internet or your cellular's data package. However, it is always recommended to use home/local Internet. It is estimated that between 20MB-30MB will be used with each study.However, the application will be looking for space in the phone to start the study.   What happens if I lose internet or bluetooth connection?  During the internet disconnection, your phone will not be able to transmit the sleep data. All the data, will be stored in your phone. As soon as the internet connection is back on, the phone will being sending the sleep data. During the bluetooth disconnection, WatchPAT one will not be able to to send the sleep data to your phone. Data will be kept in the [facility] one until two devices have bluetooth connection back on. As soon as the connection is back on, WatchPAT one will send the sleep data to the phone.  How long do I need to wear the WatchPAT one?  After you start the study, you should wear the device at least 6 hours.  How far should I keep my phone from the device?  During the night, your phone should be within 15 feet.  What happens if I leave the room for restroom or other reasons?  Leaving the room for any reason will not cause any problem. As soon as your get back to the room, both devices will reconnect and will continue to send the sleep data. Can I use my phone during the sleep study?  Yes, you can use your phone as usual during the study. But it is recommended to put your watchpat one on when you are ready to go to bed.  How will I get my study results?  A soon as you completed your study, your sleep data will be sent to the provider. They will then share the results with you when they are ready.   Follow-Up: 2 months with Louis Shields, NP in [facility]   &   Dr. Rennis Golden in Oct/ NOV

## 2023-05-21 ENCOUNTER — Ambulatory Visit: Payer: Medicare Other | Admitting: Student

## 2023-05-28 LAB — CATECHOLAMINES, FRACTIONATED, PLASMA
Dopamine: <30 pg/mL (ref 0–48)
Epinephrine: 18 pg/mL (ref 0–62)
Norepinephrine: 903 pg/mL — ABNORMAL HIGH (ref 0–874)

## 2023-05-28 LAB — TSH: TSH: 1.18 u[IU]/mL (ref 0.450–4.500)

## 2023-05-28 LAB — METANEPHRINES, PLASMA
Metanephrine, Free: <25 pg/mL (ref 0.0–88.0)
Normetanephrine, Free: 60.7 pg/mL (ref 0.0–285.2)

## 2023-06-03 ENCOUNTER — Ambulatory Visit: Payer: Medicare Other

## 2023-06-08 ENCOUNTER — Ambulatory Visit (INDEPENDENT_AMBULATORY_CARE_PROVIDER_SITE_OTHER): Payer: Medicare Other

## 2023-06-08 DIAGNOSIS — J4550 Severe persistent asthma, uncomplicated: Secondary | ICD-10-CM

## 2023-07-06 ENCOUNTER — Ambulatory Visit (INDEPENDENT_AMBULATORY_CARE_PROVIDER_SITE_OTHER): Payer: Medicare Other

## 2023-07-06 DIAGNOSIS — J4550 Severe persistent asthma, uncomplicated: Secondary | ICD-10-CM | POA: Diagnosis not present

## 2023-07-18 ENCOUNTER — Other Ambulatory Visit: Payer: Self-pay | Admitting: Allergy and Immunology

## 2023-07-20 ENCOUNTER — Telehealth: Payer: Self-pay

## 2023-07-20 NOTE — Telephone Encounter (Signed)
**Note De-Identified  Obfuscation** The pt has been advised X3 of his Pin # and advised to proceed with his Itamar-HST that was ordered on 04/06/23 but he has not.  No answer so I left a message on the pts VM asking him to call Larita Fife back at Dr Landry Dyke office concerning his Itamar-HST

## 2023-07-22 ENCOUNTER — Ambulatory Visit (INDEPENDENT_AMBULATORY_CARE_PROVIDER_SITE_OTHER): Payer: Medicare Other | Admitting: Family

## 2023-07-22 ENCOUNTER — Encounter (HOSPITAL_BASED_OUTPATIENT_CLINIC_OR_DEPARTMENT_OTHER): Payer: Self-pay | Admitting: Family

## 2023-07-22 VITALS — BP 130/76 | HR 59 | Ht 75.0 in | Wt 214.4 lb

## 2023-07-22 DIAGNOSIS — I25118 Atherosclerotic heart disease of native coronary artery with other forms of angina pectoris: Secondary | ICD-10-CM

## 2023-07-22 DIAGNOSIS — I10 Essential (primary) hypertension: Secondary | ICD-10-CM

## 2023-07-22 DIAGNOSIS — E785 Hyperlipidemia, unspecified: Secondary | ICD-10-CM

## 2023-07-22 MED ORDER — OLMESARTAN MEDOXOMIL 20 MG PO TABS
20.0000 mg | ORAL_TABLET | Freq: Every day | ORAL | 3 refills | Status: DC
Start: 2023-07-22 — End: 2024-06-07

## 2023-07-22 NOTE — Patient Instructions (Addendum)
Medication Instructions:  Your physician has recommended you make the following change in your medication:   Change: Olmesartan 20mg  in the evening    Follow-Up: 12/5 at 8am with Gillian Shields, NP    Special Instructions:  If you see that BP at home is consistently more than 130/80, let us know.

## 2023-07-22 NOTE — Progress Notes (Signed)
Advanced [facility]pertension [facility] Initial Assessment:    Date:  07/22/2023   ID:  Louis Perez, DOB Mar 17, 1951, MRN [id number]  PCP:  [facility]  Cardiologist:  Chrystie Nose, MD  Nephrologist:  Referring MD: Roe Coombs*   CC: Hypertension  History of Present Illness:    Louis Perez is a 72 y.o. male with a hx of hypertension, CAD (2017 NSTEMI with PCI-DES to ostial LAD), HLD, ascending aortic aneurysm, sinus bradycardia here to follow up in the [facility]. 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  Seen by Dr. Rennis Golden 04/06/23 noting labile blood pressure including hypotension recommended for sleep study and referral to [facility]pert[facility] Clinic. Subsequently evaluated by Tommi Rumps 04/23/23 noting exertional dyspnea and chest pain.  LHC performed 5/1 concerning for three-vessel disease with widely patent LAD stent, improved stabilization of vascular RI lesion when compared to prior, ostial PDA 55% with normal LVEF 55 to 60%.  Recommended to consider nonanginal/noncardiac etiology for dyspnea.  Established with Hypertension Clinic 05/20/23. Louis Perez was diagnosed with hypertension in his 72s. Reading with home arm cuff often 110s/60s. Prior tobacco use having quit in 1970. Alcohol use only socially. Metanephrines, catecholamines, TSH were unremarkable. Itamar home sleep study was recommended.   Presents today for follow up. Continues to exercise regularly by playing golf on Wednesdays and walking 4-5 miles in the morning. However, Has not been able to walk in the last couple of weeks due to back issue. He has had 2 prior back surgeries. Also difficulty with bursitis in his bilateral hip, thinks it is time for his next injection. Has not yet done sleep study but plans to do so. Taking morning medicine 7:30a-8:30a and at night 6:30p-8p. Was taking Olmesartan 20mg  BID and switched to taking at  bedtime only as felt morning dose was making him feel poorly, hypotensive (80/60s). Blood pressure at home since making this change has been 120s-130s. No recurrent hypotension since reducing dose. At the beach last week went up to 185/117 but this was only hypertensive reading.   Previous antihypertensives: Losartan-HCTZ Hydralazine  Past Medical History:  Diagnosis Date   Arthritis    "hips, lower back, knees, shoulders, hands" (05/14/2016)   Asthma dx'd in 2014   Bacterial septicemia (HCC) 01/2011   "both knee involved"   CAD (coronary artery disease)    a. NSTEMI 02/2016 s/p DES to LAD. b. Relook cath due to angina/prior high risk anatomy 05/14/16 -> continued medical therapy given concern that a stent strut could inferere with Cx itself.   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    NSTEMI (non-ST elevated myocardial infarction) (HCC) 02/2016   [name] 03/02/2016   Recurrent upper respiratory infection (URI)    Sinus bradycardia    Unspecified tinnitus    right    Past Surgical History:  Procedure Laterality Date   BACK SURGERY     CARDIAC CATHETERIZATION N/A 03/01/2016   Procedure: Left Heart Cath and Coronary Angiography;  Surgeon: Tonny Bollman, MD;  oLAD 95%, CFX/RI/RCA/RPDA 25-30%   CARDIAC CATHETERIZATION N/A 03/01/2016   Procedure: Coronary Stent Intervention;  Surgeon: Tonny Bollman, MD;  Promus Premier 4.0 x  12 mm DES to oLAD   CARDIAC CATHETERIZATION N/A 05/14/2016   Procedure: Left Heart Cath and Coronary Angiography;  Surgeon: Lennette Bihari, MD;  Location: [facility] INVASIVE CV  LAB;  Service: Cardiovascular;  Laterality: N/A;   CARDIAC CATHETERIZATION N/A 06/17/2016   Procedure: Intravascular Pressure Wire/FFR Study;  Surgeon: Tonny Bollman, MD;  Location: [facility] INVASIVE CV LAB;  Service: Cardiovascular;  Laterality: N/A;   CARDIOVASCULAR STRESS TEST  05/26/2012   EKG negative for  ischemia, no ECG changes, no significant ischemia noted   I & D KNEE WITH POLY EXCHANGE Bilateral 01/2011   & complete synovectomy of 3 compartments of the right total knee/notes 02/04/2011 (replacement,partial knee replacement because of bacterial infection)   JOINT REPLACEMENT     KNEE ARTHROSCOPY Right ~ 2002   LEFT HEART CATH AND CORONARY ANGIOGRAPHY N/A 04/28/2023   Procedure: LEFT HEART CATH AND CORONARY ANGIOGRAPHY;  Surgeon: Marykay Lex, MD;  Location: [facility] INVASIVE CV LAB;  Service: Cardiovascular;  Laterality: N/A;   LUMBAR DISC SURGERY  ~ 1980; 2000   NASAL SINUS SURGERY  08/2011   SHOULDER ARTHROSCOPY W/ ROTATOR CUFF REPAIR Right 01/2015   TOTAL KNEE ARTHROPLASTY Bilateral 03/26/2004-09/15/2004    right-left   TRANSTHORACIC ECHOCARDIOGRAM  05/26/2012   EF >55%, mild concentric LVH    Current Medications: Current Meds  Medication Sig   albuterol (PROVENTIL) (2.5 MG/3ML) 0.083% nebulizer solution Take 3 mLs (2.5 mg total) by nebulization every 4 (four) hours as needed for wheezing or shortness of breath.   albuterol (VENTOLIN HFA) 108 (90 Base) MCG/ACT inhaler Inhale 2 puffs into the lungs every 4 (four) hours as needed for wheezing or shortness of breath.   aspirin 81 MG tablet Take 81 mg by mouth at bedtime.    Aspirin-Acetaminophen-Caffeine (GOODY HEADACHE PO) Take 1 packet by mouth daily as needed (pain).   Budeson-Glycopyrrol-Formoterol (BREZTRI AEROSPHERE) 160-9-4.8 MCG/ACT AERO Inhale 2 puffs into the lungs in the morning and at bedtime.   carvedilol (COREG) 6.25 MG tablet Take 1 tablet (6.25 mg total) by mouth 2 (two) times daily.   clotrimazole (MYCELEX) 10 MG troche Take 1 tablet (10 mg total) by mouth 2 (two) times daily. Use after taking Breztri   famotidine (PEPCID) 20 MG tablet Take 2 tablets (40 mg total) by mouth at bedtime. (Patient taking differently: Take 20 mg by mouth at bedtime.)   ipratropium (ATROVENT) 0.06 % nasal spray Place 2 sprays into the nose 4 (four)  times daily as needed for rhinitis.   isosorbide mononitrate (IMDUR) 60 MG 24 hr tablet Take 1 tablet (60 mg total) by mouth daily.   levocetirizine (XYZAL) 5 MG tablet TAKE 1 TABLET BY MOUTH TWICE DAILY AS NEEDED (CAN TAKE AN EXTRA DOSE DURING FLARE UPS)   Multiple Vitamin (MULTIVITAMIN WITH MINERALS) TABS tablet Take 1 tablet by mouth daily.   naproxen sodium (ALEVE) 220 MG tablet Take 220 mg by mouth daily as needed (pain).   nitroGLYCERIN (NITROSTAT) 0.4 MG SL tablet Place 1 tablet (0.4 mg total) under the tongue every 5 (five) minutes as needed for chest pain.   pantoprazole (PROTONIX) 40 MG tablet Take 1 tablet (40 mg total) by mouth 2 (two) times daily.   predniSONE (STERAPRED UNI-PAK 21 TAB) 5 MG (21) TBPK tablet Take 5 mg by mouth as needed.   Spacer/Aero-Holding Chambers DEVI 1 Device by Does not apply route as directed.   Study - ORION 4 - inclisiran 300 mg/1.63mL or placebo SQ injection (PI-Stuckey) Inject 300 mg into the skin every 6 (six) months. For Investigational Use Only. Please alert research coordinator of change/addition of any lipid lowering agent or lipid panels drawn per research protocol. ORION 4  STUDY MEDICATION - inclisiran 300 mg/1.5 mL or placebo SQ injection (PI-Stuckey)   [DISCONTINUED] olmesartan (BENICAR) 20 MG tablet Take 1 tablet (20 mg total) by mouth 2 (two) times daily.   Current Facility-Administered Medications for the 07/22/23 encounter (Office Visit) with Alver Sorrow, NP  Medication   Study - ORION 4 - inclisiran 300 mg/1.69mL or placebo SQ injection (PI-Stuckey)   tezepelumab-ekko (TEZSPIRE) 210 MG/1. syringe 210 mg     Allergies:   Oxycodone and Ambien [zolpidem tartrate]   Social History   Socioeconomic History   Marital status: Married    Spouse name: Not on file   Number of children: 2   Years of education: 14   Highest education level: Not on file  Occupational History   Occupation: retired Architect, part-time security work   Tobacco Use   Smoking status: Former    Current packs/day: 0.00    Average packs/day: 1 pack/day for 5.0 years (5.0 ttl pk-yrs)    Types: Cigarettes    Start date: 12/15/1963    Quit date: 12/14/1968    Years since quitting: 54.6   Smokeless tobacco: Never  Vaping Use   Vaping status: Never Used  Substance and Sexual Activity   Alcohol use: Yes    Alcohol/week: 1.0 standard drink of alcohol    Types: 1 Cans of beer per week    Comment: Occasional   Drug use: No   Sexual activity: Not Currently    Partners: Female    Comment: Married  Other Topics Concern   Not on file  Social History Narrative   Patient lives at home with his wife . He is retired. Patient has 14 years of education. Patient has two children.   Caffeine - two cups daily.   Right-handed   Social Determinants of Health   Financial Resource Strain: Low Risk  (05/20/2023)   Overall Financial Resource Strain (CARDIA)    Difficulty of Paying Living Expenses: Not hard at all  Food Insecurity: No Food Insecurity (05/20/2023)   Hunger Vital Sign    Worried About Running Out of Food in the Last Year: Never true    Ran Out of Food in the Last Year: Never true  Transportation Needs: No Transportation Needs (05/20/2023)   PRAPARE - Administrator, Civil Service (Medical): No    Lack of Transportation (Non-Medical): No  Physical Activity: Sufficiently Active (05/20/2023)   Exercise Vital Sign    Days of Exercise per Week: 7 days    Minutes of Exercise per Session: 30 min  Stress: No Stress Concern Present (05/20/2023)   [employer] of Occupational Health - Occupational Stress Questionnaire    Feeling of Stress : Not at all  Social Connections: Socially Integrated (05/20/2023)   Social Connection and Isolation Panel [NHANES]    Frequency of Communication with Friends and Family: More than three times a week    Frequency of Social Gatherings with Friends and Family: More than three times a week    Attends  Religious Services: More than 4 times per year    Active Member of [employer] or Organizations: Yes    Attends Engineer, structural: More than 4 times per year    Marital Status: Married     Family History: The patient's family history includes Alzheimer's disease in his father; Cancer in his paternal aunt, paternal uncle, paternal uncle, and paternal uncle; Dementia in his father; Heart disease in his mother; Heart disease (age of onset: 37)  in his brother.  ROS:   Please see the history of present illness.    All other systems reviewed and are negative.  EKGs/Labs/Other Studies Reviewed:    EKG:  EKG is not ordered today.  Recent Labs: 04/23/2023: BUN 15; Creatinine, Ser 0.79; Hemoglobin 15.1; Platelets 226; Potassium 4.3; Sodium 142 05/20/2023: TSH 1.180   Recent Lipid Panel    Component Value Date/Time   CHOL 173 06/26/2020 0927   CHOL 136 01/08/2017 0833   TRIG 103 06/26/2020 0927   TRIG 109 01/08/2017 0833   HDL 54 06/26/2020 0927   HDL 53 01/08/2017 0833   CHOLHDL 3.2 06/26/2020 0927   CHOLHDL 2.6 01/08/2017 0833   CHOLHDL 3.0 03/01/2016 0448   VLDL 17 03/01/2016 0448   LDLCALC 100 (H) 06/26/2020 0927   LDLCALC 64 01/08/2017 0833    Physical Exam:   VS:  BP 130/76   Pulse (!) 59   Ht 6\' 3"  (1.905 m)   Wt 214 lb 6.4 oz (97.3 kg)   SpO2 94%   BMI 26.80 kg/m  , BMI Body mass index is 26.8 kg/m. GENERAL:  Well appearing HEENT: Pupils equal round and reactive, fundi not visualized, oral mucosa unremarkable NECK:  No jugular venous distention, waveform within normal limits, carotid upstroke brisk and symmetric, no bruits, no thyromegaly LYMPHATICS:  No cervical adenopathy LUNGS:  Clear to auscultation bilaterally HEART:  RRR.  PMI not displaced or sustained,S1 and S2 within normal limits, no S3, no S4, no clicks, no rubs, no murmurs ABD:  Flat, positive bowel sounds normal in frequency in pitch, no bruits, no rebound, no guarding, no midline pulsatile mass, no  hepatomegaly, no splenomegaly EXT:  2 plus pulses throughout, no edema, no cyanosis no clubbing SKIN:  No rashes no nodules. NEURO:  Cranial nerves II through XII grossly intact, motor grossly intact throughout PSYCH:  Cognitively intact, oriented to person place and time   ASSESSMENT/PLAN:    HTN -longstanding history of hypertension having been diagnosed in his 54s.  BP has been labile with symptomatic hypotension.  Recent hypotension on Olmesartan BID which resolved with adjusting to at bedtime dosing. Continue present regimen Olmesartan 20mg  QHS, Carvedilol 6.25mg  BID, Imdur 60mg  QD. Secondary workup, as below. BP symmetrical, no indication for carotid duplex nor evidence of aortic coarctation.  Renal duplex 03/2022 no stenosis Metanephrines, catecholamines, TSH normal. As BP controlled, will defer renin-aldosterone  Itamar sleep study approved, plans to complete this week.   Snores - Encouraged to complete home sleep study. Already has Itamar home sleep study.   Coronary artery disease/hyperlipidemia, LDL goal less than 70- LHC 04/28/23 patent stent and otherwise nonobstructive disease. Stable with no anginal symptoms. No indication for ischemic evaluation. GDMT aspirin, carvedilol, imdur. Prior statin intolerance, participating in [place] lipid trial and as such lipid results are blinded. Heart healthy diet and regular cardiovascular exercise encouraged.    Screening for Secondary Hypertension:     05/20/2023   12:43 PM  Causes  Drugs/Herbals Screened     - Comments 05/20/23 recommend decreased caffeine intake  Renovascular HTN Screened     - Comments renal duplex 03/2023 no stenosis  Sleep Apnea Screened  Thyroid Disease Screened  Pheochromocytoma Screened     - Comments 05/2023 metanephrines, catecholamines  Cushing's Syndrome N/A     - Comments non cushingoid appearance  Coarctation of the Aorta N/A     - Comments symmetrical BP  Compliance Screened     - Comments Takes  medications regularly  Relevant Labs/Studies:    Latest Ref Rng & Units 04/23/2023    3:34 PM 03/26/2022    3:07 PM 06/08/2019    3:03 PM  Basic Labs  Sodium 134 - 144 mmol/L 142  143  143   Potassium 3.5 - 5.2 mmol/L 4.3  4.5  4.3   Creatinine 0.76 - 1.27 mg/dL 1.61  0.96  0.45        Latest Ref Rng & Units 05/20/2023    8:57 AM 03/26/2022    3:07 PM  Thyroid   TSH 0.450 - 4.500 uIU/mL 1.180  1.080           Latest Ref Rng & Units 05/20/2023    8:57 AM  Metanephrines/Catecholamines   Epinephrine 0 - 62 pg/mL 18   Norepinephrine 0 - 874 pg/mL 903   Dopamine 0 - 48 pg/mL <30   Metanephrines 0.0 - 88.0 pg/mL <25.0   Normetanephrines  0.0 - 285.2 pg/mL 60.7           04/03/2022    8:41 AM  Renovascular   Renal Artery [place] Completed Yes       Disposition:    FU with MD/APP/PharmD in 4 months    Medication Adjustments/Labs and Tests Ordered: Current medicines are reviewed at length with the patient today.  Concerns regarding medicines are outlined above.  No orders of the defined types were placed in this encounter.  Meds ordered this encounter  Medications   olmesartan (BENICAR) 20 MG tablet    Sig: Take 1 tablet (20 mg total) by mouth daily.    Dispense:  90 tablet    Refill:  3     Signed, Alver Sorrow, NP  07/22/2023 4:47 PM    [facility]

## 2023-08-03 ENCOUNTER — Ambulatory Visit (INDEPENDENT_AMBULATORY_CARE_PROVIDER_SITE_OTHER): Payer: Medicare Other | Admitting: *Deleted

## 2023-08-03 DIAGNOSIS — J4550 Severe persistent asthma, uncomplicated: Secondary | ICD-10-CM | POA: Diagnosis not present

## 2023-08-31 ENCOUNTER — Ambulatory Visit (INDEPENDENT_AMBULATORY_CARE_PROVIDER_SITE_OTHER): Payer: Medicare Other

## 2023-08-31 DIAGNOSIS — J4550 Severe persistent asthma, uncomplicated: Secondary | ICD-10-CM

## 2023-09-28 ENCOUNTER — Ambulatory Visit: Payer: Medicare Other | Admitting: Allergy and Immunology

## 2023-09-28 ENCOUNTER — Ambulatory Visit: Payer: Medicare Other

## 2023-09-28 ENCOUNTER — Encounter: Payer: Self-pay | Admitting: Allergy and Immunology

## 2023-09-28 ENCOUNTER — Other Ambulatory Visit: Payer: Self-pay

## 2023-09-28 VITALS — BP 130/86 | HR 68 | Temp 98.4°F | Resp 16 | Ht 75.0 in | Wt 208.9 lb

## 2023-09-28 DIAGNOSIS — J3089 Other allergic rhinitis: Secondary | ICD-10-CM | POA: Diagnosis not present

## 2023-09-28 DIAGNOSIS — K219 Gastro-esophageal reflux disease without esophagitis: Secondary | ICD-10-CM | POA: Diagnosis not present

## 2023-09-28 DIAGNOSIS — J4550 Severe persistent asthma, uncomplicated: Secondary | ICD-10-CM

## 2023-09-28 MED ORDER — LEVOCETIRIZINE DIHYDROCHLORIDE 5 MG PO TABS: 5.0000 mg | ORAL_TABLET | Freq: Every evening | ORAL | 1 refills | Status: DC

## 2023-09-28 MED ORDER — FAMOTIDINE 40 MG PO TABS: 40.0000 mg | ORAL_TABLET | Freq: Every day | ORAL | 1 refills | Status: DC

## 2023-09-28 MED ORDER — IPRATROPIUM BROMIDE 0.06 % NA SOLN: NASAL | 1 refills | Status: DC

## 2023-09-28 NOTE — Patient Instructions (Addendum)
  1. Continue Breztri - 1-2 inhalations 1-2 times per day w/ spacer (empty lungs).    2. Continue Tezepelumab injections every 4 weeks  3. Continue famotidine 40 mg - 1 time per day  4. Continue the following if needed:  A. Albuterol HFA - 2 inhalations every 4-6 hours B. Ipratropium 0.06% - 1-2 sprays each nostril every 6 hours to dry nose C. OTC antihistamine - claritin / allegra / zyrtec D. Clortrimazole troche after [employer] use    5. Obtain fall flu vaccine  6. Return to clinic in 6 months or earlier if problem

## 2023-09-28 NOTE — Progress Notes (Unsigned)
[place] - [place] - [place] - [place] - [place]   Follow-up Note  Referring Provider: Roe Coombs* Primary Provider: [facility] Date of Office Visit: 09/28/2023  Subjective:   Louis Perez (DOB: 1951/07/10) is a 72 y.o. male who [facility] in re-evaluation of the following:  HPI: Ed returns to this clinic in evaluation of asthma, allergic rhinitis, LPR, and arthralgia.  I last saw him in this clinic 23 March 2023.  He has really done well with his asthma and he has been able to taper down his Breztri to just 1 inhalation 1 time per day while using tezepelumab injections every 4 weeks.  And he uses a clotrimazole atrocious after his Markus Daft which has resulted in resolution of his thrush.  He has done pretty well with his nose and he rarely uses any nasal ipratropium.  He has not been having any problems with reflux as he is consolidate his treatment and he is only using famotidine 1 time per day.  For his arthralgia he is receiving injections into his hips and his shoulders on occasion.  He probably gets a few injections per year.            Allergies as of 09/28/2023       Reactions   Oxycodone Anaphylaxis, Swelling   Ambien [zolpidem Tartrate] Other (See Comments)   Mood changes         Medication List    albuterol (2.5 MG/3ML) 0.083% nebulizer solution Commonly known as: PROVENTIL Take 3 mLs (2.5 mg total) by nebulization every 4 (four) hours as needed for wheezing or shortness of breath.   albuterol 108 (90 Base) MCG/ACT inhaler Commonly known as: Ventolin HFA Inhale 2 puffs into the lungs every 4 (four) hours as needed for wheezing or shortness of breath.   aspirin 81 MG tablet Take 81 mg by mouth at bedtime.   Breztri Aerosphere 160-9-4.8 MCG/ACT Aero Generic drug: Budeson-Glycopyrrol-Formoterol Inhale 2 puffs into the lungs in the morning and at bedtime.    carvedilol 6.25 MG tablet Commonly known as: COREG Take 1 tablet (6.25 mg total) by mouth 2 (two) times daily.   clotrimazole 10 MG troche Commonly known as: MYCELEX Take 1 tablet (10 mg total) by mouth 2 (two) times daily. Use after taking Breztri   CoQ10 400 MG Caps   famotidine 20 MG tablet Commonly known as: PEPCID Take 2 tablets (40 mg total) by mouth at bedtime.   GOODY HEADACHE PO Take 1 packet by mouth daily as needed (pain).   ipratropium 0.06 % nasal spray Commonly known as: ATROVENT Place 2 sprays into the nose 4 (four) times daily as needed for rhinitis.   isosorbide mononitrate 60 MG 24 hr tablet Commonly known as: IMDUR Take 1 tablet (60 mg total) by mouth daily.   levocetirizine 5 MG tablet Commonly known as: XYZAL TAKE 1 TABLET BY MOUTH TWICE DAILY AS NEEDED (CAN TAKE AN EXTRA DOSE DURING FLARE UPS)   multivitamin with minerals Tabs tablet Take 1 tablet by mouth daily.   naproxen sodium 220 MG tablet Commonly known as: ALEVE Take 220 mg by mouth daily as needed (pain).   nitroGLYCERIN 0.4 MG SL tablet Commonly known as: NITROSTAT Place 1 tablet (0.4 mg total) under the tongue every 5 (five) minutes as needed for chest pain.   olmesartan 20 MG tablet Commonly known as: BENICAR Take 1 tablet (20 mg total) by mouth daily.  ORION 4 inclisiran or placebo 300 mg/1.5 mL SQ injection Inject 300 mg into the skin every 6 (six) months. For Investigational Use Only. Please alert research coordinator of change/addition of any lipid lowering agent or lipid panels drawn per research protocol. ORION 4 STUDY MEDICATION - inclisiran 300 mg/1.5 mL or placebo SQ injection (PI-Stuckey)   pantoprazole 40 MG tablet Commonly known as: PROTONIX Take 1 tablet (40 mg total) by mouth 2 (two) times daily.   predniSONE 5 MG (21) Tbpk tablet Commonly known as: STERAPRED UNI-PAK 21 TAB Take 5 mg by mouth as needed.   Spacer/Aero-Holding [employer] 1 Device by Does not  apply route as directed.    Past Medical History:  Diagnosis Date   Arthritis    "hips, lower back, knees, shoulders, hands" (05/14/2016)   Asthma dx'd in 2014   Bacterial septicemia (HCC) 01/2011   "both knee involved"   CAD (coronary artery disease)    a. NSTEMI 02/2016 s/p DES to LAD. b. Relook cath due to angina/prior high risk anatomy 05/14/16 -> continued medical therapy given concern that a stent strut could inferere with Cx itself.   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    NSTEMI (non-ST elevated myocardial infarction) (HCC) 02/2016   [name] 03/02/2016   Recurrent upper respiratory infection (URI)    Sinus bradycardia    Unspecified tinnitus    right    Past Surgical History:  Procedure Laterality Date   BACK SURGERY     CARDIAC CATHETERIZATION N/A 03/01/2016   Procedure: Left Heart Cath and Coronary Angiography;  Surgeon: Tonny Bollman, MD;  oLAD 95%, CFX/RI/RCA/RPDA 25-30%   CARDIAC CATHETERIZATION N/A 03/01/2016   Procedure: Coronary Stent Intervention;  Surgeon: Tonny Bollman, MD;  Promus Premier 4.0 x  12 mm DES to oLAD   CARDIAC CATHETERIZATION N/A 05/14/2016   Procedure: Left Heart Cath and Coronary Angiography;  Surgeon: Lennette Bihari, MD;  [facility] INVAS[facility] CV LAB;  Service: Cardiovascular;  Laterality: N/A;   CARDIAC CATHETERIZATION N/A 06/17/2016   Procedure: Intravascular Pressure Wire/FFR Study;  Surgeon: Tonny Bollman, MD;  Location: [facility] INVASIVE CV LAB;  Service: Cardiovascular;  Laterality: N/A;   CARDIOVASCULAR STRESS TEST  05/26/2012   EKG negative for ischemia, no ECG changes, no significant ischemia noted   I & D KNEE WITH POLY EXCHANGE Bilateral 01/2011   & complete synovectomy of 3 compartments of the right total knee/notes 02/04/2011 (replacement,partial knee replacement because of bacterial infection)   JOINT REPLACEMENT     KNEE ARTHROSCOPY Right ~ 2002    LEFT HEART CATH AND CORONARY ANGIOGRAPHY N/A 04/28/2023   Procedure: LEFT HEART CATH AND CORONARY ANGIOGRAPHY;  Surgeon: Marykay Lex, MD;  Location: [facility] INVASIVE CV LAB;  Service: Cardiovascular;  Laterality: N/A;   LUMBAR DISC SURGERY  ~ 1980; 2000   NASAL SINUS SURGERY  08/2011   SHOULDER ARTHROSCOPY W/ ROTATOR CUFF REPAIR Right 01/2015   TOTAL KNEE ARTHROPLASTY Bilateral 03/26/2004-09/15/2004    right-left   TRANSTHORACIC ECHOCARDIOGRAM  05/26/2012   EF >55%, mild concentric LVH    Review of systems negative except as noted in HPI / PMHx or noted below:  Review of Systems  Constitutional: Negative.   HENT: Negative.    Eyes: Negative.   Respiratory: Negative.    Cardiovascular: Negative.   Gastrointestinal: Negative.   Genitourinary: Negative.   Musculoskeletal: Negative.   Skin:  Negative.   Neurological: Negative.   Endo/Heme/Allergies: Negative.   Psychiatric/Behavioral: Negative.       Objective:   Vitals:   09/28/23 0908  BP: 130/86  Pulse: 68  Resp: 16  Temp: 98.4 F (36.9 C)  SpO2: 96%   Height: 6\' 3"  (190.5 cm)  Weight: 208 lb 14.4 oz (94.8 kg)   Physical Exam Constitutional:      Appearance: He is not diaphoretic.  HENT:     Head: Normocephalic.     Right Ear: Tympanic membrane, ear canal and external ear normal.     Left Ear: Tympanic membrane, ear canal and external ear normal.     Nose: Nose normal. No mucosal edema or rhinorrhea.     Mouth/Throat:     Pharynx: Uvula midline. No oropharyngeal exudate.  Eyes:     Conjunctiva/sclera: Conjunctivae normal.  Neck:     Thyroid: No thyromegaly.     Trachea: Trachea normal. No tracheal tenderness or tracheal deviation.  Cardiovascular:     Rate and Rhythm: Normal rate and regular rhythm.     Heart sounds: Normal heart sounds, S1 normal and S2 normal. No murmur heard. Pulmonary:     Effort: No respiratory distress.     Breath sounds: Normal breath sounds. No stridor. No wheezing or rales.   Lymphadenopathy:     Head:     Right side of head: No tonsillar adenopathy.     Left side of head: No tonsillar adenopathy.     Cervical: No cervical adenopathy.  Skin:    Findings: No erythema or rash.     Nails: There is no clubbing.  Neurological:     Mental Status: He is alert.     Diagnostics:    Spirometry was performed and demonstrated an FEV1 of 2.42 at 66 % of predicted.  The patient had an Asthma Control Test with the following results: ACT Total Score: 22.    Results of blood tests obtained 23 March 2023 identifies RNP antibody 1.1U/mL, CRP 2 Mg/L, sed rate 13 CCP 5, rheumatoid factor less than 10U/mL   Assessment and Plan:   1. Asthma, severe persistent, well-controlled   2. Other allergic rhinitis   3. LPRD (laryngopharyngeal reflux disease)    1. Continue Breztri - 1-2 inhalations 1-2 times per day w/ spacer (empty lungs).    2. Continue Tezepelumab injections every 4 weeks  3. Continue famotidine 40 mg - 1 time per day  4. Continue the following if needed:  A. Albuterol HFA - 2 inhalations every 4-6 hours B. Ipratropium 0.06% - 1-2 sprays each nostril every 6 hours to dry nose C. OTC antihistamine - claritin / allegra / zyrtec D. Clortrimazole troche after [employer] use    5. Obtain fall flu vaccine  6. Return to clinic in 6 months or earlier if problem  Ed appears to be doing quite well while using tezepelumab as his major controller agent for his respiratory tract disease.  He is on very low doses of Breztri and I had a discussion with him today about the possibility of stopping his Breztri.  He has a very good understanding of his disease state and how his medications work and appropriate dosing of his medications depending on disease activity.  He will remain on therapy for reflux with the use of famotidine and he has a selection of agents to be utilized should they be required as noted above.  If he does well I will see him back in this  clinic in 6  months or earlier if there is a problem.  Laurette Schimke, MD Allergy / Immunology [facility]

## 2023-09-29 ENCOUNTER — Encounter: Payer: Self-pay | Admitting: Allergy and Immunology

## 2023-10-12 ENCOUNTER — Ambulatory Visit: Payer: Medicare Other | Attending: Internal Medicine | Admitting: Internal Medicine

## 2023-10-12 ENCOUNTER — Encounter: Payer: Self-pay | Admitting: Internal Medicine

## 2023-10-12 VITALS — BP 118/86 | HR 57 | Ht 75.0 in | Wt 210.4 lb

## 2023-10-12 DIAGNOSIS — I10 Essential (primary) hypertension: Secondary | ICD-10-CM | POA: Diagnosis not present

## 2023-10-12 DIAGNOSIS — I25118 Atherosclerotic heart disease of native coronary artery with other forms of angina pectoris: Secondary | ICD-10-CM | POA: Diagnosis not present

## 2023-10-12 DIAGNOSIS — R0989 Other specified symptoms and signs involving the circulatory and respiratory systems: Secondary | ICD-10-CM | POA: Diagnosis present

## 2023-10-12 DIAGNOSIS — E785 Hyperlipidemia, unspecified: Secondary | ICD-10-CM | POA: Diagnosis not present

## 2023-10-12 DIAGNOSIS — Z9189 Other specified personal risk factors, not elsewhere classified: Secondary | ICD-10-CM | POA: Diagnosis not present

## 2023-10-12 NOTE — Patient Instructions (Signed)
Medication Instructions:  No changes  *If you need a refill on your cardiac medications before your next appointment, please call your pharmacy*   Lab Work: Not needed If you have labs (blood work) drawn today and your tests are completely normal, you will receive your results only by: MyChart Message (if you have MyChart) OR A paper copy in the mail If you have any lab test that is abnormal or we need to change your treatment, we will call you to review the results.   Testing/Procedures: Not needed   Follow-Up: At [facility], you and your health needs are our priority.  As part of our continuing mission to provide you with exceptional heart care, we have created designated Provider Care Teams.  These Care Teams include your primary Cardiologist (physician) and Advanced Practice Providers (APPs -  Physician Assistants and Nurse Practitioners) who all work together to provide you with the care you need, when you need it.     Your next appointment:   12 month(s)  The format for your next appointment:   In Person  Provider:   Chrystie Nose, MD

## 2023-10-12 NOTE — Progress Notes (Signed)
Cardiology Office Note   Date:  10/12/2023   ID:  Louis Perez, DOB 11-Oct-1951, MRN [id number]  PCP:  [facility]  Cardiologist:   Chrystie Nose, MD   CC: Follow-up  History of Present Illness: Louis Perez is a 72 y.o. male who presents for evaluation of chest and back pain. Patient has a history of coronary disease with recent NSTEMI in March with DES stenting of the LAD.  He had non obstructive disease elsewhere.  EF was normal.    He was added to my schedule today for evaluation of chest and arm discomfort. This began on Sunday. It is similar to his previous unstable angina but not as severe. Still he describes the peak of his discomfort at 7 out of 10 in intensity. He gets across his chest. His left arm hurt. At times he has noticed some diaphoresis. It might last for only a minute. He has taken nitroglycerin and over the course of the last few days and it improves the symptoms. He describes it as somewhat heavy. He's not had any palpitations, presyncope or syncope. He's not describing any new shortness of breath, PND or orthopnea. He has brought the discomfort on a few times when he's been doing activities although this is been intermittent. He was actually able to do a little golfing yesterday without symptoms. However, prior to this he had discomfort with golfing building his children sandbox. This morning the pain was at rest. He still describes a little mid chest tightness.  Of note he has not had the symptoms since his stent.  I did review his catheterization. I looked at the images and he had a very high grade proximal LAD in March with nonobstructive disease in the right coronary.  05/28/2016  Mr. Louis Perez is a 72 year old patient who I last saw in 2014. He did not see me in follow-up since that time and is seen a number of other providers in our practice, ultimately leading to presentation with an STEMI in March with a placement  of a drug-eluting stent in the proximal LAD. Subsequent to that he had chest pain as described above and was referred back to the hospital for coronary evaluation. Catheterization indicated an 80% ostial ramus lesion at the area of partially jailed vessel due to the proximal LAD stent. It was felt that intervention could compromise the LAD therefore medical therapy was recommended. His isosorbide was doubled to 60 mg daily. Since then he reports he has had some improvement in chest discomfort but has significant headache.  06/15/2016  Louis Perez was seen back today in the office for chest pain. When I last saw him we had increased his isosorbide up to 60 mg daily and he's been on Ranexa thousand milligrams twice a day. He reports he continues to have episodes of chest discomfort which can occur at rest or with exertion. I've advised that he stop cardiac rehabilitation until we can determine whether or not his pain is related to coronary obstruction.  07/23/2016  Louis Perez returns for follow-up today. He underwent repeat cardiac catheterization by Dr. Excell Seltzer. This showed the following:  Conclusion   Prox RCA to Mid RCA lesion, 25% stenosed. Ost LM lesion, 25% stenosed. Ost Ramus lesion, 75% stenosed. Mid LAD to Dist LAD lesion, 50% stenosed. Ost 2nd Diag to 2nd Diag lesion, 30% stenosed.  1. Continued patency of the proximal LAD stent 2. Mild diffuse nonobstructive RCA stenosis with diffuse  coronary ectasia 3. Patent left circumflex 4. Moderate-severe stenosis of the ramus intermedius with negative pressure wire analysis (FFR = 0.92 baseline and 0.85 at peak hyperemia)   Recommend: resume cardiac rehab, medical therapy   No intervention was performed. Since discharge she notes that he's had no further chest pain. He feels like the change in his isosorbide is been helpful. He is about to finish card agreeable dictation and will continue to exercise at the [facility]. He's lost about 5-7 pounds and is  well within the normal weight range at this time.  01/04/2017  Louis Perez returns today for follow-up. He reports that initially had some improvement with increasing his medical therapy for angina but still continues to have sharp, intermittent chest pains on a daily basis. This is typically in the right sternal area. He would not clearly say that his symptoms are progressively getting worse. Blood pressure is well-controlled today. His LDL cholesterol is 93 on 80 mg Lipitor. I advised him that his goal cholesterol should be much lower and that we may have difficulty reaching that since he is on a high potency statin. Options include adding ezetimibe or perhaps enrolling him in the Orion-10 trial. He did seem to be interested in the study medication. His last lipid profile was earlier this year and he will be due for repeat blood work. He is also had problems with asthma and has seen Dr. Sherene Sires for evaluation of this and to include cyclical shortness of breath.  03/22/2017  Louis Perez returns today for follow-up. He is doing fairly well denies any recurrent chest pain. Unfortunately has an upper respiratory infection and is had a flare of his asthma recently. Cholesterol is much better control with LDL now down to 64 on recent lab work with an LDL particle number less than 1000. ApoB and LPA level both at goal as well. It's now been over one year since his drug-eluting stent. He underwent a relook catheterization which showed a patent stent a couple months after his initial procedure for ongoing chest pain. He is also on Ranexa and isosorbide. He is interested in coming off some of his medicines.  08/02/2017  Louis Perez returns today for follow-up. He reports he occasionally gets some chest discomfort but its quick, sharp and atypical for coronary disease. He's found nothing to be limiting of his activities. He weaned himself off Ranexa by his request. He remains on isosorbide 60 mg twice a day. He has not  required short acting nitroglycerin. Overall he feels fairly well. His energy level is good. He is on atorvastatin 80 mg with an LDL C a less than 70. Blood pressure is at goal.  02/15/2018  Mr. Feuerhelm returns today for follow-up.  He continues to get occasional sharp chest discomfort.  This is a similar complaint he had in August of last year.  He took himself off Ranexa and noticed no difference in his symptoms.  He is on isosorbide.  He said none of the episodes last long enough to take short acting nitro.  His wife is concerned only that he seems to mention it more frequently.  The episodes are not associated with exertion or relieved by rest.  EKG personally reviewed today shows normal sinus rhythm without ischemic changes at 64.  09/26/2018  Mr. Brancaccio is seen today in follow-up.  Over the past year he is done very well.  He is now walking up to about 20 miles a week.  He denies any sharp chest  pains or worsening shortness of breath.  He is off of his antianginal medications.  He had lipid testing this summer and LDL was 81.  He reports a pretty healthy diet however does have some intake of saturated fats that could be improved.  11/14/2019  Mr. Ansley is seen today in follow-up.  This is a routine visit however he has been having some symptoms.  Recently has had some worsening dyspnea on exertion.  He did see his pulmonologist and had medications adjusted for his asthma and was treated for possible upper respiratory infection.  He is also been struggling with sores in his mouth and was placed on a steroid rinse for that.  He is noted labile blood pressures recently.  His symptoms are worse with regards to dyspnea walking up hills but he denies any chest pain.  His last coronary evaluation was in 2017 at which time he had FFR of the ramus intermedius which was 0.85 noting a 75% ostial lesion.  This was not intervened upon.  He also had 50% mid to distal LAD stenosis and some mild disease of the  other coronaries.  He has been on medical therapy since then.  I am concerned that he is possibly had some progressive coronary disease.  11/28/2019  Mr. Borrell returns today for follow-up.  He underwent stress testing which was negative for ischemia and demonstrated normal LV function.  After adjusting his medications his blood pressure is now better controlled.  He says the shortness of breath has improved as well.  06/25/2020  Mr. Biter is seen today in follow-up.  Recently he was seen by Azalee Course, PA-C in April.  At that time he was having some intermediate chest discomfort.  His Imdur was increased.  Since then he said no more symptoms.  Overall he says he feels really well.  He gets some occasional shortness of breath but he attributes this to his asthma which is quite significant requiring a number of inhalers and immunotherapy.  Blood pressures well controlled today.  He is overdue for lipid profile which was last assessed in November 2019.  LDL at the time was 73.  EKG personally reviewed today shows sinus rhythm.  04/21/2021  Mr. Christopher is seen today in follow-up.  He recently enrolled in the [place] for trial.  This would randomize him to inclisiran or placebo on top of his atorvastatin.  He denies any chest pain or worsening shortness of breath.  He is now 5 years out from PCI.  EKG shows a sinus rhythm at 64.  04/14/2022  Mr. Kazmierski returns today for follow-up.  He recently saw Robin Searing, NP for acute blood pressure management.  He was noticing spikes in his blood pressure mostly in the evening.  He was placed on a 24-hour ambulatory blood pressure monitor which I personally reviewed.  This showed actually good control of his blood pressure except for as he mention a spike in blood pressure little before midnight.  He is also been having worsening shortness of breath over the past several weeks, particularly since Easter.  He recently saw his allergist and was noted to have severe  reduction in lung function and felt to have acute asthma exacerbation.  He was prescribed steroids and given a Depo steroid.  He has not started the oral steroids and was not able to fill his inhaler.  He still reports he is shortness of breath.  Blood pressure appears well controlled today.  He was also complaining of  generalized body pains.  I thought this might be related to his statin and I advise discontinuing it for statin holiday.  He has been off of it for less than a week but notices no difference.  I did advise him that it could take 2 to 4 weeks for this to improve.  He is also enrolled in the [place] 4, and I will update the principal investigator of this change.  04/06/2023  Mr. Chima returns for follow-up.  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  Last year I placed an amatory blood pressure monitor and noted that he was having some elevated blood pressures in the evening.  I had readjusted his medications and split some medicines to get even coverage throughout the day.  The seem to be doing well up until he said around Christmas when he contracted COVID-19.  He had been vaccinated and took Paxlovid.  Despite this, his blood pressures have been labile since then.  He says he gets a headache when his blood pressure is elevated.  He has seen some blood pressures in the 200s but mostly in the 150-160 systolic.  Today actually the blood pressure was normal prior to coming in and in the office his blood pressure is actually low at 93/60.  His blood pressure cuff is reasonably new it was purchased last year.  After speaking with him and his wife it turns out that he does snore and has had some difficulty staying asleep at night and does feel some fatigue during the day and there may be a component of sleep apnea.  10/12/2023  Mr. Yeck is seen today in follow-up.  He was referred to the hypertension clinic because of some labile hypertension.  There were medication adjustments  made.  He had been sent his it MAR sleep study however has not performed it yet.  He has had it for several months.  I highly recommended that he perform the sleep study this week.  He said he had to scan it and some information was a little confused about how to do that but he said he would get some assistance with it and try to accomplish it this week.  Past Medical History:  Diagnosis Date   Arthritis    "hips, lower back, knees, shoulders, hands" (05/14/2016)   Asthma dx'd in 2014   Bacterial septicemia (HCC) 01/2011   "both knee involved"   CAD (coronary artery disease)    a. NSTEMI 02/2016 s/p DES to LAD. b. Relook cath due to angina/prior high risk anatomy 05/14/16 -> continued medical therapy given concern that a stent strut could inferere with Cx itself.   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    NSTEMI (non-ST elevated myocardial infarction) (HCC) 02/2016   [name] 03/02/2016   Recurrent upper respiratory infection (URI)    Sinus bradycardia    Unspecified tinnitus    right    Past Surgical History:  Procedure Laterality Date   BACK SURGERY     CARDIAC CATHETERIZATION N/A 03/01/2016   Procedure: Left Heart Cath and Coronary Angiography;  Surgeon: Tonny Bollman, MD;  oLAD 95%, CFX/RI/RCA/RPDA 25-30%   CARDIAC CATHETERIZATION N/A 03/01/2016   Procedure: Coronary Stent Intervention;  Surgeon: Tonny Bollman, MD;  Promus Premier 4.0 x  12 mm DES to [facility]   CARDIAC CATHETERIZATION N/A 05/14/2016   Procedure: Left Heart Cath  and Coronary Angiography;  Surgeon: Lennette Bihari, MD;  Location: [facility] INVASIVE CV LAB;  Service: Cardiovascular;  Laterality: N/A;   CARDIAC CATHETERIZATION N/A 06/17/2016   Procedure: Intravascular Pressure Wire/FFR Study;  Surgeon: Tonny Bollman, MD;  Location: [facility] INVASIVE CV LAB;  Service: Cardiovascular;  Laterality: N/A;   CARDIOVASCULAR STRESS TEST  05/26/2012   EKG  negative for ischemia, no ECG changes, no significant ischemia noted   I & D KNEE WITH POLY EXCHANGE Bilateral 01/2011   & complete synovectomy of 3 compartments of the right total knee/notes 02/04/2011 (replacement,partial knee replacement because of bacterial infection)   JOINT REPLACEMENT     KNEE ARTHROSCOPY Right ~ 2002   LEFT HEART CATH AND CORONARY ANGIOGRAPHY N/A 04/28/2023   Procedure: LEFT HEART CATH AND CORONARY ANGIOGRAPHY;  Surgeon: Marykay Lex, MD;  Location: [facility] INVASIVE CV LAB;  Service: Cardiovascular;  Laterality: N/A;   LUMBAR DISC SURGERY  ~ 1980; 2000   NASAL SINUS SURGERY  08/2011   SHOULDER ARTHROSCOPY W/ ROTATOR CUFF REPAIR Right 01/2015   TOTAL KNEE ARTHROPLASTY Bilateral 03/26/2004-09/15/2004    right-left   TRANSTHORACIC ECHOCARDIOGRAM  05/26/2012   EF >55%, mild concentric LVH     Current Outpatient Medications  Medication Sig Dispense Refill   albuterol (PROVENTIL) (2.5 MG/3ML) 0.083% nebulizer solution Take 3 mLs (2.5 mg total) by nebulization every 4 (four) hours as needed for wheezing or shortness of breath. 75 mL 1   albuterol (VENTOLIN HFA) 108 (90 Base) MCG/ACT inhaler Inhale 2 puffs into the lungs every 4 (four) hours as needed for wheezing or shortness of breath. 18 g 1   aspirin 81 MG tablet Take 81 mg by mouth at bedtime.      Aspirin-Acetaminophen-Caffeine (GOODY HEADACHE PO) Take 1 packet by mouth daily as needed (pain).     Budeson-Glycopyrrol-Formoterol (BREZTRI AEROSPHERE) 160-9-4.8 MCG/ACT AERO Inhale 2 puffs into the lungs in the morning and at bedtime. 10.7 g 5   carvedilol (COREG) 6.25 MG tablet Take 1 tablet (6.25 mg total) by mouth 2 (two) times daily. 180 tablet 3   clotrimazole (MYCELEX) 10 MG troche Take 1 tablet (10 mg total) by mouth 2 (two) times daily. Use after taking Breztri 180 tablet 1   Coenzyme Q10 (COQ10) 400 MG CAPS Take 400 mg by mouth daily.     famotidine (PEPCID) 40 MG tablet Take 1 tablet (40 mg total) by mouth daily. 90  tablet 1   ipratropium (ATROVENT) 0.06 % nasal spray 1-2 sprays each nostril every 6 hours to dry nose 45 mL 1   isosorbide mononitrate (IMDUR) 60 MG 24 hr tablet Take 1 tablet (60 mg total) by mouth daily. 90 tablet 3   levocetirizine (XYZAL) 5 MG tablet Take 1 tablet (5 mg total) by mouth every evening. 90 tablet 1   Multiple Vitamin (MULTIVITAMIN WITH MINERALS) TABS tablet Take 1 tablet by mouth daily.     naproxen sodium (ALEVE) 220 MG tablet Take 220 mg by mouth daily as needed (pain).     nitroGLYCERIN (NITROSTAT) 0.4 MG SL tablet Place 1 tablet (0.4 mg total) under the tongue every 5 (five) minutes as needed for chest pain. 25 tablet 3   olmesartan (BENICAR) 20 MG tablet Take 1 tablet (20 mg total) by mouth daily. 90 tablet 3   predniSONE (STERAPRED UNI-PAK 21 TAB) 5 MG (21) TBPK tablet Take 5 mg by mouth as needed.     Spacer/Aero-Holding Chambers DEVI 1 Device by Does not apply route as  directed. 1 each 1   Study - ORION 4 - inclisiran 300 mg/1.27mL or placebo SQ injection (PI-Stuckey) Inject 300 mg into the skin every 6 (six) months. For Investigational Use Only. Please alert research coordinator of change/addition of any lipid lowering agent or lipid panels drawn per research protocol. ORION 4 STUDY MEDICATION - inclisiran 300 mg/1.5 mL or placebo SQ injection (PI-Stuckey)     Current Facility-Administered Medications  Medication Dose Route Frequency Provider Last Rate Last Admin   Study - ORION 4 - inclisiran 300 mg/1.28mL or placebo SQ injection (PI-Stuckey)  300 mg Subcutaneous Q6 months Selda Jalbert, Lisette Abu, MD   300 mg at 05/17/23 0913   tezepelumab-ekko (TEZSPIRE) 210 MG/1. syringe 210 mg  210 mg Subcutaneous Q28 days Jessica Priest, MD   210 mg at 09/28/23 7253    Allergies:   Oxycodone and Ambien [zolpidem tartrate]    Social History:  The patient  reports that he quit smoking about 54 years ago. His smoking use included cigarettes. He started smoking about 59 years ago. He has  a 5 pack-year smoking history. He has never used smokeless tobacco. He reports current alcohol use of about 1.0 standard drink of alcohol per week. He reports that he does not use drugs.   Family History:  The patient's family history includes Alzheimer's disease in his father; Cancer in his paternal aunt, paternal uncle, paternal uncle, and paternal uncle; Dementia in his father; Heart disease in his mother; Heart disease (age of onset: 41) in his brother.    ROS:   Pertinent items noted in HPI and remainder of comprehensive ROS otherwise negative.  PHYSICAL EXAM: VS:  BP 118/86 (BP Location: Left Arm, Patient Position: Sitting, Cuff Size: Normal)   Pulse (!) 57   Ht 6\' 3"  (1.905 m)   Wt 210 lb 6.4 oz (95.4 kg)   SpO2 95%   BMI 26.30 kg/m  , BMI Body mass index is 26.3 kg/m. General appearance: alert, no distress and Tanned skin Neck: no carotid bruit, no JVD and thyroid not enlarged, symmetric, no tenderness/mass/nodules Lungs: clear to auscultation bilaterally Heart: regular rate and rhythm, S1, S2 normal, no murmur, click, rub or gallop Abdomen: soft, non-tender; bowel sounds normal; no masses,  no organomegaly Extremities: extremities normal, atraumatic, no cyanosis or edema Pulses: 2+ and symmetric Skin: Skin color, texture, turgor normal. No rashes or lesions Neurologic: Grossly normal Psych: Pleasant  EKG:   EKG Interpretation Date/Time:  Tuesday October 12 2023 14:20:34 EDT Ventricular Rate:  57 PR Interval:  164 QRS Duration:  94 QT Interval:  428 QTC Calculation: 416 R Axis:   24  Text Interpretation: Sinus bradycardia When compared with ECG of 22-Feb-2017 01:10, No significant change was found Confirmed by Zoila Shutter [phone]) on 10/12/2023 2:24:16 PM    Recent Labs: 04/23/2023: BUN 15; Creatinine, Ser 0.79; Hemoglobin 15.1; Platelets 226; Potassium 4.3; Sodium 142 05/20/2023: TSH 1.180   Lipid Panel    Component Value Date/Time   CHOL 173 06/26/2020 0927    CHOL 136 01/08/2017 0833   TRIG 103 06/26/2020 0927   TRIG 109 01/08/2017 0833   HDL 54 06/26/2020 0927   HDL 53 01/08/2017 0833   CHOLHDL 3.2 06/26/2020 0927   CHOLHDL 2.6 01/08/2017 0833   CHOLHDL 3.0 03/01/2016 0448   VLDL 17 03/01/2016 0448   LDLCALC 100 (H) 06/26/2020 0927   LDLCALC 64 01/08/2017 0833     Wt Readings from Last 3 Encounters:  10/12/23 210 lb 6.4 oz (  95.4 kg)  09/28/23 208 lb 14.4 oz (94.8 kg)  07/22/23 214 lb 6.4 oz (97.3 kg)    ASSESSMENT AND PLAN:  Patient Active Problem List   Diagnosis Date Noted   Constipation 06/29/2022   Other specified abnormal findings of blood chemistry 06/29/2022   Severe persistent asthma with acute exacerbation 11/25/2020   Shortness of breath 11/25/2020   Viral upper respiratory infection 11/25/2020   Asthma, severe persistent, well-controlled 11/09/2018   Other allergic rhinitis 11/09/2018   LPRD (laryngopharyngeal reflux disease) 11/09/2018   Stomatitis and mucositis 11/09/2018   Tinnitus of both ears 09/05/2018   Visual disturbance 09/01/2018   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   Sensorineural hearing loss (SNHL) of right ear 07/11/2018   Pansinusitis 01/06/2017   Hyperlipidemia LDL goal <70 07/23/2016   Anginal chest pain at rest [facility]) 06/17/2016   CAD in native artery 06/15/2016   Stented coronary artery 06/15/2016   Pain in the chest 05/28/2016   Sinus bradycardia 05/15/2016   Unstable angina (HCC) 05/14/2016   CAD (coronary artery disease), native coronary artery 05/14/2016   Adjustment insomnia 02/28/2016   Gilbert's syndrome 02/28/2016   Hematospermia 02/28/2016   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 Be is doing well and denies any chest pain or worsening shortness of  breath.  His blood pressure is better controlled and was excellent today 118/86.  He has follow-up with hypertension clinic in December.  He has not yet done his sleep study although he has had the equipment for several months.  I have encouraged him to perform this home sleep study and return the equipment so that we can better gauge his risk.  Follow-up with me annually or sooner as necessary.  Chrystie Nose, MD, [facility], FACP  [place]  [facility]  Medical Director of the Advanced Lipid Disorders &  Cardiovascular Risk Reduction Clinic Diplomate of the American Board of Clinical Lipidology Attending Cardiologist  Direct Dial: [phone]  Fax: [phone]  Website:  [email or web address]  Chrystie Nose, MD  10/12/2023 2:24 PM

## 2023-10-25 ENCOUNTER — Other Ambulatory Visit: Payer: Self-pay | Admitting: Allergy and Immunology

## 2023-10-26 ENCOUNTER — Ambulatory Visit: Payer: Medicare Other

## 2023-10-26 DIAGNOSIS — J4550 Severe persistent asthma, uncomplicated: Secondary | ICD-10-CM | POA: Diagnosis not present

## 2023-11-02 ENCOUNTER — Other Ambulatory Visit: Payer: Self-pay | Admitting: Allergy and Immunology

## 2023-11-15 ENCOUNTER — Other Ambulatory Visit: Payer: Self-pay

## 2023-11-15 DIAGNOSIS — Z006 Encounter for examination for normal comparison and control in clinical research program: Secondary | ICD-10-CM

## 2023-11-15 MED ORDER — STUDY - ORION 4 - INCLISIRAN 300 MG/1.5 ML OR PLACEBO SQ INJECTION (PI-STUCKEY)
300.0000 mg | INJECTION | SUBCUTANEOUS | Status: DC
  Administered 2023-11-15: 300 mg via SUBCUTANEOUS
  Filled 2023-11-15: qty 1.5

## 2023-11-16 NOTE — Research (Signed)
ORION 4  Patient doing well  No complaints  IP given    Current Outpatient Medications:    albuterol (PROVENTIL) (2.5 MG/3ML) 0.083% nebulizer solution, Take 3 mLs (2.5 mg total) by nebulization every 4 (four) hours as needed for wheezing or shortness of breath., Disp: 75 mL, Rfl: 1   albuterol (VENTOLIN HFA) 108 (90 Base) MCG/ACT inhaler, Inhale 2 puffs into the lungs every 4 (four) hours as needed for wheezing or shortness of breath., Disp: 18 g, Rfl: 1   aspirin 81 MG tablet, Take 81 mg by mouth at bedtime. , Disp: , Rfl:    Aspirin-Acetaminophen-Caffeine (GOODY HEADACHE PO), Take 1 packet by mouth daily as needed (pain)., Disp: , Rfl:    Budeson-Glycopyrrol-Formoterol (BREZTRI AEROSPHERE) 160-9-4.8 MCG/ACT AERO, Inhale 2 puffs into the lungs in the morning and at bedtime., Disp: 10.7 g, Rfl: 5   carvedilol (COREG) 6.25 MG tablet, Take 1 tablet (6.25 mg total) by mouth 2 (two) times daily., Disp: 180 tablet, Rfl: 3   clotrimazole (MYCELEX) 10 MG troche, Take 1 tablet (10 mg total) by mouth 2 (two) times daily. Use after taking Breztri, Disp: 180 tablet, Rfl: 1   Coenzyme Q10 (COQ10) 400 MG CAPS, Take 400 mg by mouth daily., Disp: , Rfl:    famotidine (PEPCID) 40 MG tablet, Take 1 tablet (40 mg total) by mouth daily., Disp: 90 tablet, Rfl: 1   ipratropium (ATROVENT) 0.06 % nasal spray, 1-2 sprays each nostril every 6 hours to dry nose, Disp: 45 mL, Rfl: 1   isosorbide mononitrate (IMDUR) 60 MG 24 hr tablet, Take 1 tablet (60 mg total) by mouth daily., Disp: 90 tablet, Rfl: 3   levocetirizine (XYZAL) 5 MG tablet, TAKE 1 TABLET BY MOUTH TWICE DAILY AS NEEDED (CAN TAKE AN EXTRA DOSE DURING FLARE UPS), Disp: 60 tablet, Rfl: 5   Multiple Vitamin (MULTIVITAMIN WITH MINERALS) TABS tablet, Take 1 tablet by mouth daily., Disp: , Rfl:    naproxen sodium (ALEVE) 220 MG tablet, Take 220 mg by mouth daily as needed (pain)., Disp: , Rfl:    nitroGLYCERIN (NITROSTAT) 0.4 MG SL tablet, Place 1 tablet (0.4 mg  total) under the tongue every 5 (five) minutes as needed for chest pain., Disp: 25 tablet, Rfl: 3   olmesartan (BENICAR) 20 MG tablet, Take 1 tablet (20 mg total) by mouth daily., Disp: 90 tablet, Rfl: 3   Spacer/Aero-Holding Chambers DEVI, 1 Device by Does not apply route as directed., Disp: 1 each, Rfl: 1   Study - ORION 4 - inclisiran 300 mg/1.62mL or placebo SQ injection (PI-Stuckey), Inject 300 mg into the skin every 6 (six) months. For Investigational Use Only. Please alert research coordinator of change/addition of any lipid lowering agent or lipid panels drawn per research protocol. OR[facility] 4 STUDY MEDICATION - inclisiran 300 mg/1.5 mL or placebo SQ injection (PI-Stuckey), Disp: , Rfl:    predniSONE (STERAPRED UNI-PAK 21 TAB) 5 MG (21) TBPK tablet, Take 5 mg by mouth as needed. (Patient not taking: Reported on 11/15/2023), Disp: , Rfl:   Current Facility-Administered Medications:    Study - ORION 4 - inclisiran 300 mg/1.56mL or placebo SQ injection (PI-Stuckey), 300 mg, Subcutaneous, Q6 months, , 300 mg at 11/15/23 0951   tezepelumab-ekko (TEZSPIRE) 210 MG/1. syringe 210 mg, 210 mg, Subcutaneous, Q28 days, Kozlow, Alvira Philips, MD, 210 mg at 10/26/23 0901

## 2023-11-16 NOTE — Progress Notes (Unsigned)
Advanced Hypertension Clinic Assessment:    Date:  11/18/2023   ID:  Louis Perez, DOB 1951/02/02, MRN [id number]  PCP:  [facility]  Cardiologist:  Chrystie Nose, MD  Nephrologist:  Referring MD: Roe Coombs*   CC: Hypertension  History of Present Illness:    Louis Perez is a 72 y.o. male with a hx of hypertension, CAD (2017 NSTEMI with PCI-DES to ostial LAD), HLD, ascending aortic aneurysm, sinus bradycardia here to follow up in the Advanced Hypertension Clinic. 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  Seen by Dr. Rennis Golden 04/06/23 noting labile blood pressure including hypotension recommended for sleep study and referral to Advanced Hypertension Clinic. Subsequently evaluated by Tommi Rumps 04/23/23 noting exertional dyspnea and chest pain.  LHC performed 5/1 concerning for three-vessel disease with widely patent LAD stent, improved stabilization of vascular RI lesion when compared to prior, ostial PDA 55% with normal LVEF 55 to 60%.  Recommended to consider nonanginal/noncardiac etiology for dyspnea.  Established with Hypertension Clinic 05/20/23. [name] was diagnosed with hypertension in his 37s. Reading with home arm cuff often 110s/60s. Prior tobacco use having quit in 1970. Alcohol use only socially. Metanephrines, catecholamines, TSH were unremarkable. Itamar home sleep study was ordered but not yet completed .  At last ADV HTN visit 07/22/23 hypotension resolved with adjusting olmesartan to bedtime dosing.  Seen by Dr. Rennis Golden 10/12/23 and again encouraged to complete home sleep study.   Presents today for follow up. Last week had to resume PRN Hydrazine for systolic BP up to 180s and additionally took a couple PRN doses of Olmesartan. No new salt, stressors. Had recently had injections in his shoulder which he wonders if was contributory. No PRN dosing required this week.. Symptomatic with headache at  time of elevated pressures. Takes Aleve on a regular basis for "aches and pains" but did not take Tylenol or other medicine for headache. Still lightheaded on occasion with position changes but no near syncope, syncope.  Previous antihypertensives: Losartan-HCTZ Hydralazine  Past Medical History:  Diagnosis Date   Arthritis    "hips, lower back, knees, shoulders, hands" (05/14/2016)   Asthma dx'd in 2014   Bacterial septicemia (HCC) 01/2011   "both knee involved"   CAD (coronary artery disease)    a. NSTEMI 02/2016 s/p DES to LAD. b. Relook cath due to angina/prior high risk anatomy 05/14/16 -> continued medical therapy given concern that a stent strut could inferere with Cx itself.   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    NSTEMI (non-ST elevated myocardial infarction) (HCC) 02/2016   [name] 03/02/2016   Recurrent upper respiratory infection (URI)    Sinus bradycardia    Unspecified tinnitus    right    Past Surgical History:  Procedure Laterality Date   BACK SURGERY     CARDIAC CATHETERIZATION N/A 03/01/2016   Procedure: Left Heart Cath and Coronary Angiography;  Surgeon: Tonny Bollman, MD;  oLAD 95%, CFX/RI/RCA/RPDA 25-30%   CARDIAC CATHETERIZATION N/A 03/01/2016   Procedure: Coronary Stent Intervention;  Surgeon: Tonny Bollman, MD;  Promus Premier 4.0 x  12 mm DES to oLAD   CARDIAC CATHETERIZATION N/A 05/14/2016   Procedure: Left Heart Cath and Coronary Angiography;  Surgeon: Lennette Bihari, MD;  Location: [facility] INVASIVE CV LAB;  Service: Cardiovascular;  Laterality: N/A;   CARDIAC CATHETERIZATION N/A 06/17/2016  Procedure: Intravascular Pressure Wire/FFR Study;  Surgeon: Tonny Bollman, MD;  Location: [facility] INVASIVE CV LAB;  Service: Cardiovascular;  Laterality: N/A;   CARDIOVASCULAR STRESS TEST  05/26/2012   EKG negative for ischemia, no ECG changes, no significant ischemia noted   I & D  KNEE WITH POLY EXCHANGE Bilateral 01/2011   & complete synovectomy of 3 compartments of the right total knee/notes 02/04/2011 (replacement,partial knee replacement because of bacterial infection)   JOINT REPLACEMENT     KNEE ARTHROSCOPY Right ~ 2002   LEFT HEART CATH AND CORONARY ANGIOGRAPHY N/A 04/28/2023   Procedure: LEFT HEART CATH AND CORONARY ANGIOGRAPHY;  Surgeon: Marykay Lex, MD;  Location: [facility] INVASIVE CV LAB;  Service: Cardiovascular;  Laterality: N/A;   LUMBAR DISC SURGERY  ~ 1980; 2000   NASAL SINUS SURGERY  08/2011   SHOULDER ARTHROSCOPY W/ ROTATOR CUFF REPAIR Right 01/2015   TOTAL KNEE ARTHROPLASTY Bilateral 03/26/2004-09/15/2004    right-left   TRANSTHORACIC ECHOCARDIOGRAM  05/26/2012   EF >55%, mild concentric LVH    Current Medications: Current Meds  Medication Sig   albuterol (PROVENTIL) (2.5 MG/3ML) 0.083% nebulizer solution Take 3 mLs (2.5 mg total) by nebulization every 4 (four) hours as needed for wheezing or shortness of breath.   albuterol (VENTOLIN HFA) 108 (90 Base) MCG/ACT inhaler Inhale 2 puffs into the lungs every 4 (four) hours as needed for wheezing or shortness of breath.   aspirin 81 MG tablet Take 81 mg by mouth at bedtime.    Aspirin-Acetaminophen-Caffeine (GOODY HEADACHE PO) Take 1 packet by mouth daily as needed (pain).   Budeson-Glycopyrrol-Formoterol (BREZTRI AEROSPHERE) 160-9-4.8 MCG/ACT AERO Inhale 2 puffs into the lungs in the morning and at bedtime.   carvedilol (COREG) 6.25 MG tablet Take 1 tablet (6.25 mg total) by mouth 2 (two) times daily.   clotrimazole (MYCELEX) 10 MG troche Take 1 tablet (10 mg total) by mouth 2 (two) times daily. Use after taking Breztri   Coenzyme Q10 (COQ10) 400 MG CAPS Take 400 mg by mouth daily.   famotidine (PEPCID) 40 MG tablet Take 1 tablet (40 mg total) by mouth daily.   ipratropium (ATROVENT) 0.06 % nasal spray 1-2 sprays each nostril every 6 hours to dry nose   isosorbide mononitrate (IMDUR) 60 MG 24 hr tablet Take 1  tablet (60 mg total) by mouth daily.   levocetirizine (XYZAL) 5 MG tablet TAKE 1 TABLET BY MOUTH TWICE DAILY AS NEEDED (CAN TAKE AN EXTRA DOSE DURING FLARE UPS)   Multiple Vitamin (MULTIVITAMIN WITH MINERALS) TABS tablet Take 1 tablet by mouth daily.   naproxen sodium (ALEVE) 220 MG tablet Take 220 mg by mouth daily as needed (pain).   nitroGLYCERIN (NITROSTAT) 0.4 MG SL tablet Place 1 tablet (0.4 mg total) under the tongue every 5 (five) minutes as needed for chest pain.   olmesartan (BENICAR) 20 MG tablet Take 1 tablet (20 mg total) by mouth daily.   Spacer/Aero-Holding Chambers DEVI 1 Device by Does not apply route as directed.   Study - ORION 4 - inclisiran 300 mg/1.41mL or placebo SQ injection (PI-Stuckey) Inject 300 mg into the skin every 6 (six) months. For Investigational Use Only. Please alert research coordinator of change/addition of any lipid lowering agent or lipid panels drawn per research protocol. ORION 4 STUDY MEDICATION - inclisiran 300 mg/1.5 mL or placebo SQ injection (PI-Stuckey)   [DISCONTINUED] hydrALAZINE (APRESOLINE) 25 MG tablet Take 25 mg by mouth as needed (Elevated BP).   [DISCONTINUED] predniSONE (STERAPRED UNI-PAK 21 TAB) 5  MG (21) TBPK tablet Take 5 mg by mouth as needed.   Current Facility-Administered Medications for the 11/18/23 encounter (Office Visit) with Alver Sorrow, NP  Medication   Study - ORION 4 - inclisiran 300 mg/1.70mL or placebo SQ injection (PI-Stuckey)   tezepelumab-ekko (TEZSPIRE) 210 MG/1. syringe 210 mg     Allergies:   Oxycodone and Ambien [zolpidem tartrate]   Social History   Socioeconomic History   Marital status: Married    Spouse name: Not on file   Number of children: 2   Years of education: 14   Highest education level: Not on file  Occupational History   Occupation: retired Architect, part-time security work  Tobacco Use   Smoking status: Former    Current packs/day: 0.00    Average packs/day: 1 pack/day for 5.0  years (5.0 ttl pk-yrs)    Types: Cigarettes    Start date: 12/15/1963    Quit date: 12/14/1968    Years since quitting: 54.9   Smokeless tobacco: Never  Vaping Use   Vaping status: Never Used  Substance and Sexual Activity   Alcohol use: Yes    Alcohol/week: 1.0 standard drink of alcohol    Types: 1 Cans of beer per week    Comment: Occasional   Drug use: No   Sexual activity: Not Currently    Partners: Female    Comment: Married  Other Topics Concern   Not on file  Social History Narrative   Patient lives at home with his wife . He is retired. Patient has 14 years of education. Patient has two children.   Caffeine - two cups daily.   Right-handed   Social Determinants of Health   Financial Resource Strain: Low Risk  (05/20/2023)   Overall Financial Resource Strain (CARDIA)    Difficulty of Paying Living Expenses: Not hard at all  Food Insecurity: No Food Insecurity (05/20/2023)   Hunger Vital Sign    Worried About Running Out of Food in the Last Year: Never true    Ran Out of Food in the Last Year: Never true  Transportation Needs: No Transportation Needs (05/20/2023)   PRAPARE - Administrator, Civil Service (Medical): No    Lack of Transportation (Non-Medical): No  Physical Activity: Sufficiently Active (05/20/2023)   Exercise Vital Sign    Days of Exercise per Week: 7 days    Minutes of Exercise per Session: 30 min  Stress: No Stress Concern Present (05/20/2023)   [employer] of Occupational Health - Occupational Stress Questionnaire    Feeling of Stress : Not at all  Social Connections: Socially Integrated (05/20/2023)   Social Connection and Isolation Panel [NHANES]    Frequency of Communication with Friends and Family: More than three times a week    Frequency of Social Gatherings with Friends and Family: More than three times a week    Attends Religious Services: More than 4 times per year    Active Member of [employer] or Organizations: Yes    Attends Museum/gallery exhibitions officer: More than 4 times per year    Marital Status: Married     Family History: The patient's family history includes Alzheimer's disease in his father; Cancer in his paternal aunt, paternal uncle, paternal uncle, and paternal uncle; Dementia in his father; Heart disease in his mother; Heart disease (age of onset: 64) in his brother.  ROS:   Please see the history of present illness.    All other systems reviewed  and are negative.  EKGs/Labs/Other Studies Reviewed:         Recent Labs: 04/23/2023: BUN 15; Creatinine, Ser 0.79; Hemoglobin 15.1; Platelets 226; Potassium 4.3; Sodium 142 05/20/2023: TSH 1.180   Recent Lipid Panel    Component Value Date/Time   CHOL 173 06/26/2020 0927   CHOL 136 01/08/2017 0833   TRIG 103 06/26/2020 0927   TRIG 109 01/08/2017 0833   HDL 54 06/26/2020 0927   HDL 53 01/08/2017 0833   CHOLHDL 3.2 06/26/2020 0927   CHOLHDL 2.6 01/08/2017 0833   CHOLHDL 3.0 03/01/2016 0448   VLDL 17 03/01/2016 0448   LDLCALC 100 (H) 06/26/2020 0927   LDLCALC 64 01/08/2017 0833    Physical Exam:   VS:  BP 105/72   Pulse 70   Ht 6\' 3"  (1.905 m)   Wt 211 lb (95.7 kg)   SpO2 95%   BMI 26.37 kg/m  , BMI Body mass index is 26.37 kg/m. GENERAL:  Well appearing HEENT: Pupils equal round and reactive, fundi not visualized, oral mucosa unremarkable NECK:  No jugular venous distention, waveform within normal limits, carotid upstroke brisk and symmetric, no bruits, no thyromegaly LYMPHATICS:  No cervical adenopathy LUNGS:  Clear to auscultation bilaterally HEART:  RRR.  PMI not displaced or sustained,S1 and S2 within normal limits, no S3, no S4, no clicks, no rubs, no murmurs ABD:  Flat, positive bowel sounds normal in frequency in pitch, no bruits, no rebound, no guarding, no midline pulsatile mass, no hepatomegaly, no splenomegaly EXT:  2 plus pulses throughout, no edema, no cyanosis no clubbing SKIN:  No rashes no nodules. NEURO:  Cranial nerves II  through XII grossly intact, motor grossly intact throughout PSYCH:  Cognitively intact, oriented to person place and time   ASSESSMENT/PLAN:    HTN -longstanding history of hypertension having been diagnosed in his 85s.  BP has been labile with episode of hypertension requiring PRN Hydralazine last week but also prior symptomatic hypotension.  BP controlled in clinic today and over last few days. Continue present regimen Olmesartan 20mg  QHS, Carvedilol 6.25mg  BID, Imdur 60mg  QD. If BP persistently elevated in future, plan to increase Olmesartan. If persistently low, could further reduce. Secondary workup, as below.  BP symmetrical, no indication for carotid duplex nor evidence of aortic coarctation.  Renal duplex 03/2022 no stenosis Metanephrines, catecholamines, TSH normal. As BP controlled, will defer renin-aldosterone  Itamar sleep study approved, plans to complete this week.   Snores - Encouraged to complete home sleep study. Already has Itamar home sleep study. We reviewed how to use today and assisted him to download the app on his phone.   Coronary artery disease/hyperlipidemia, LDL goal less than 70- LHC 04/28/23 patent stent and otherwise nonobstructive disease. Stable with no anginal symptoms. No indication for ischemic evaluation. GDMT aspirin, carvedilol, imdur. Prior statin intolerance, participating in [place] lipid trial and as such lipid results are blinded. Heart healthy diet and regular cardiovascular exercise encouraged.    Screening for Secondary Hypertension:     05/20/2023   12:43 PM  Causes  Drugs/Herbals Screened     - Comments 05/20/23 recommend decreased caffeine intake  Renovascular HTN Screened     - Comments renal duplex 03/2023 no stenosis  Sleep Apnea Screened  Thyroid Disease Screened  Pheochromocytoma Screened     - Comments 05/2023 metanephrines, catecholamines  Cushing's Syndrome N/A     - Comments non cushingoid appearance  Coarctation of the Aorta N/A     -  Comments  symmetrical BP  Compliance Screened     - Comments Takes medications regularly    Relevant Labs/Studies:    Latest Ref Rng & Units 04/23/2023    3:34 PM 03/26/2022    3:07 PM 06/08/2019    3:03 PM  Basic Labs  Sodium 134 - 144 mmol/L 142  143  143   Potassium 3.5 - 5.2 mmol/L 4.3  4.5  4.3   Creatinine 0.76 - 1.27 mg/dL 4.78  2.95  6.21        Latest Ref Rng & Units 05/20/2023    8:57 AM 03/26/2022    3:07 PM  Thyroid   TSH 0.450 - 4.500 uIU/mL 1.180  1.080           Latest Ref Rng & Units 05/20/2023    8:57 AM  Metanephrines/Catecholamines   Epinephrine 0 - 62 pg/mL 18   Norepinephrine 0 - 874 pg/mL 903   Dopamine 0 - 48 pg/mL <30   Metanephrines 0.0 - 88.0 pg/mL <25.0   Normetanephrines  0.0 - 285.2 pg/mL 60.7           04/03/2022    8:41 AM  Renovascular   Renal Artery [place] Completed Yes       Disposition:    FU with MD/APP/PharmD in 6 months    Medication Adjustments/Labs and Tests Ordered: Current medicines are reviewed at length with the patient today.  Concerns regarding medicines are outlined above.  No orders of the defined types were placed in this encounter.  Meds ordered this encounter  Medications   hydrALAZINE (APRESOLINE) 25 MG tablet    Sig: Take 1 tablet (25 mg total) by mouth as needed (as needed for systolic blood pressure more than 160).    Dispense:  30 tablet    Refill:  2     Signed, Alver Sorrow, NP  11/18/2023 8:36 AM    [facility]

## 2023-11-18 ENCOUNTER — Ambulatory Visit (HOSPITAL_BASED_OUTPATIENT_CLINIC_OR_DEPARTMENT_OTHER): Payer: Medicare Other | Admitting: Family

## 2023-11-18 ENCOUNTER — Encounter (HOSPITAL_BASED_OUTPATIENT_CLINIC_OR_DEPARTMENT_OTHER): Payer: Self-pay | Admitting: Family

## 2023-11-18 VITALS — BP 105/72 | HR 70 | Ht 75.0 in | Wt 211.0 lb

## 2023-11-18 DIAGNOSIS — R0683 Snoring: Secondary | ICD-10-CM

## 2023-11-18 DIAGNOSIS — E785 Hyperlipidemia, unspecified: Secondary | ICD-10-CM | POA: Diagnosis not present

## 2023-11-18 DIAGNOSIS — I25118 Atherosclerotic heart disease of native coronary artery with other forms of angina pectoris: Secondary | ICD-10-CM | POA: Diagnosis not present

## 2023-11-18 DIAGNOSIS — I10 Essential (primary) hypertension: Secondary | ICD-10-CM | POA: Diagnosis not present

## 2023-11-18 MED ORDER — HYDRALAZINE HCL 25 MG PO TABS
25.0000 mg | ORAL_TABLET | ORAL | 2 refills | Status: AC | PRN
Start: 2023-11-18 — End: ?

## 2023-11-18 NOTE — Patient Instructions (Signed)
Medication Instructions:  Continue your current medications.    Testing/Procedures: Go ahead and complete your sleep study - the pin is 1234.   Follow-Up: In 6 months with Advanced Hypertension Clinic

## 2023-11-23 ENCOUNTER — Ambulatory Visit: Payer: Medicare Other

## 2023-11-23 DIAGNOSIS — J4550 Severe persistent asthma, uncomplicated: Secondary | ICD-10-CM

## 2023-12-21 ENCOUNTER — Ambulatory Visit: Payer: Medicare Other

## 2023-12-21 DIAGNOSIS — J4550 Severe persistent asthma, uncomplicated: Secondary | ICD-10-CM

## 2024-01-18 ENCOUNTER — Ambulatory Visit: Payer: Medicare Other

## 2024-01-18 DIAGNOSIS — J4550 Severe persistent asthma, uncomplicated: Secondary | ICD-10-CM

## 2024-02-15 ENCOUNTER — Ambulatory Visit (INDEPENDENT_AMBULATORY_CARE_PROVIDER_SITE_OTHER): Payer: Medicare Other

## 2024-02-15 DIAGNOSIS — J4550 Severe persistent asthma, uncomplicated: Secondary | ICD-10-CM | POA: Diagnosis not present

## 2024-03-01 ENCOUNTER — Other Ambulatory Visit: Payer: Self-pay | Admitting: Allergy and Immunology

## 2024-03-14 ENCOUNTER — Ambulatory Visit

## 2024-03-14 DIAGNOSIS — J4550 Severe persistent asthma, uncomplicated: Secondary | ICD-10-CM | POA: Diagnosis not present

## 2024-04-04 ENCOUNTER — Ambulatory Visit: Payer: Medicare Other | Admitting: Allergy and Immunology

## 2024-04-11 ENCOUNTER — Ambulatory Visit (INDEPENDENT_AMBULATORY_CARE_PROVIDER_SITE_OTHER)

## 2024-04-11 DIAGNOSIS — J4550 Severe persistent asthma, uncomplicated: Secondary | ICD-10-CM

## 2024-04-23 ENCOUNTER — Other Ambulatory Visit: Payer: Self-pay | Admitting: Allergy and Immunology

## 2024-04-26 ENCOUNTER — Other Ambulatory Visit: Payer: Self-pay | Admitting: Allergy and Immunology

## 2024-04-30 ENCOUNTER — Other Ambulatory Visit: Payer: Self-pay | Admitting: Allergy and Immunology

## 2024-05-07 ENCOUNTER — Other Ambulatory Visit: Payer: Self-pay | Admitting: Allergy and Immunology

## 2024-05-09 ENCOUNTER — Ambulatory Visit

## 2024-05-12 ENCOUNTER — Other Ambulatory Visit: Payer: Self-pay | Admitting: Allergy and Immunology

## 2024-05-15 ENCOUNTER — Encounter: Payer: Medicare Other | Admitting: *Deleted

## 2024-05-15 DIAGNOSIS — Z006 Encounter for examination for normal comparison and control in clinical research program: Secondary | ICD-10-CM

## 2024-05-15 MED ORDER — STUDY - ORION 4 - INCLISIRAN 300 MG/1.5 ML OR PLACEBO SQ INJECTION (PI-STUCKEY)
300.0000 mg | INJECTION | SUBCUTANEOUS | Status: AC
  Administered 2024-05-15: 300 mg via SUBCUTANEOUS
  Filled 2024-05-15: qty 1.5

## 2024-05-16 ENCOUNTER — Ambulatory Visit

## 2024-05-16 DIAGNOSIS — J4550 Severe persistent asthma, uncomplicated: Secondary | ICD-10-CM | POA: Diagnosis not present

## 2024-05-16 NOTE — Research (Signed)
 Orion 4  Patient doing well Hurt his back a few months ago doing yard work  Healing  Hadn't been walking much, going to start back doing one mile then increasing with time.  Labs drawn  Will see patient back in Dec 2025    Current Outpatient Medications:    aspirin  81 MG tablet, Take 81 mg by mouth at bedtime. , Disp: , Rfl:    carvedilol  (COREG ) 6.25 MG tablet, Take 1 tablet (6.25 mg total) by mouth 2 (two) times daily., Disp: 180 tablet, Rfl: 3   cetirizine  (ZYRTEC ) 10 MG tablet, TAKE 1 TABLET BY MOUTH ONCE DAILY AS NEEDED FOR ALLERGIES (CAN TAKE AN EXTRA DOSE DURING FLARE UPS), Disp: 180 tablet, Rfl: 0   famotidine  (PEPCID ) 40 MG tablet, Take 1 tablet (40 mg total) by mouth daily., Disp: 90 tablet, Rfl: 1   hydrALAZINE  (APRESOLINE ) 25 MG tablet, Take 1 tablet (25 mg total) by mouth as needed (as needed for systolic blood pressure more than 160)., Disp: 30 tablet, Rfl: 2   isosorbide  mononitrate (IMDUR ) 60 MG 24 hr tablet, Take 1 tablet (60 mg total) by mouth daily., Disp: 90 tablet, Rfl: 3   levocetirizine (XYZAL ) 5 MG tablet, TAKE 1 TABLET BY MOUTH TWICE DAILY AS NEEDED. CAN TAKE EXTRA DOSE DURING FLARE UPS, Disp: 60 tablet, Rfl: 0   nitroGLYCERIN  (NITROSTAT ) 0.4 MG SL tablet, Place 1 tablet (0.4 mg total) under the tongue every 5 (five) minutes as needed for chest pain., Disp: 25 tablet, Rfl: 3   olmesartan  (BENICAR ) 20 MG tablet, Take 1 tablet (20 mg total) by mouth daily., Disp: 90 tablet, Rfl: 3   albuterol  (PROVENTIL ) (2.5 MG/3ML) 0.083% nebulizer solution, Take 3 mLs (2.5 mg total) by nebulization every 4 (four) hours as needed for wheezing or shortness of breath., Disp: 75 mL, Rfl: 1   albuterol  (VENTOLIN  HFA) 108 (90 Base) MCG/ACT inhaler, Inhale 2 puffs into the lungs every 4 (four) hours as needed for wheezing or shortness of breath., Disp: 18 g, Rfl: 1   Aspirin -Acetaminophen -Caffeine (GOODY HEADACHE PO), Take 1 packet by mouth daily as needed (pain)., Disp: , Rfl:     Budeson-Glycopyrrol-Formoterol  (BREZTRI  AEROSPHERE) 160-9-4.8 MCG/ACT AERO, Inhale 2 puffs into the lungs in the morning and at bedtime., Disp: 10.7 g, Rfl: 5   clotrimazole  (MYCELEX ) 10 MG troche, Take 1 tablet (10 mg total) by mouth 2 (two) times daily. Use after taking Breztri , Disp: 180 tablet, Rfl: 1   Coenzyme Q10 (COQ10) 400 MG CAPS, Take 400 mg by mouth daily., Disp: , Rfl:    ipratropium (ATROVENT ) 0.06 % nasal spray, 1-2 sprays each nostril every 6 hours to dry nose, Disp: 45 mL, Rfl: 1   Spacer/Aero-Holding Chambers DEVI, 1 Device by Does not apply route as directed., Disp: 1 each, Rfl: 1   Study - ORION 4 - inclisiran 300 mg/1.54mL or placebo SQ injection (PI-Stuckey), Inject 300 mg into the skin every 6 (six) months. For Investigational Use Only. Please alert research coordinator of change/addition of any lipid lowering agent or lipid panels drawn per research protocol. ORION 4 STUDY MEDICATION - inclisiran 300 mg/1.5 mL or placebo SQ injection (PI-Stuckey), Disp: , Rfl:   Current Facility-Administered Medications:    Study - ORION 4 - inclisiran 300 mg/1.5mL or placebo SQ injection (PI-Stuckey), 300 mg, Subcutaneous, Q6 months, , 300 mg at 05/15/24 0845   tezepelumab -ekko (TEZSPIRE ) 210 MG/1. syringe 210 mg, 210 mg, Subcutaneous, Q28 days, Kozlow, Rema Care, MD, 210 mg at 05/16/24 [phone]

## 2024-05-28 ENCOUNTER — Other Ambulatory Visit: Payer: Self-pay | Admitting: Allergy and Immunology

## 2024-06-04 ENCOUNTER — Other Ambulatory Visit (HOSPITAL_BASED_OUTPATIENT_CLINIC_OR_DEPARTMENT_OTHER): Payer: Self-pay | Admitting: Family

## 2024-06-04 DIAGNOSIS — E785 Hyperlipidemia, unspecified: Secondary | ICD-10-CM

## 2024-06-04 DIAGNOSIS — I25118 Atherosclerotic heart disease of native coronary artery with other forms of angina pectoris: Secondary | ICD-10-CM

## 2024-06-04 DIAGNOSIS — I10 Essential (primary) hypertension: Secondary | ICD-10-CM

## 2024-06-06 ENCOUNTER — Ambulatory Visit (HOSPITAL_BASED_OUTPATIENT_CLINIC_OR_DEPARTMENT_OTHER): Admitting: Cardiovascular Disease

## 2024-06-06 ENCOUNTER — Telehealth: Payer: Self-pay

## 2024-06-06 ENCOUNTER — Encounter (HOSPITAL_BASED_OUTPATIENT_CLINIC_OR_DEPARTMENT_OTHER): Payer: Self-pay | Admitting: Cardiovascular Disease

## 2024-06-06 ENCOUNTER — Ambulatory Visit (INDEPENDENT_AMBULATORY_CARE_PROVIDER_SITE_OTHER): Admitting: Allergy and Immunology

## 2024-06-06 VITALS — BP 118/82 | HR 78 | Temp 97.6°F | Resp 14 | Ht 75.0 in | Wt 207.7 lb

## 2024-06-06 VITALS — BP 112/73 | HR 68 | Resp 16 | Ht 75.0 in | Wt 205.6 lb

## 2024-06-06 DIAGNOSIS — I2510 Atherosclerotic heart disease of native coronary artery without angina pectoris: Secondary | ICD-10-CM

## 2024-06-06 DIAGNOSIS — J3089 Other allergic rhinitis: Secondary | ICD-10-CM | POA: Diagnosis not present

## 2024-06-06 DIAGNOSIS — I10 Essential (primary) hypertension: Secondary | ICD-10-CM

## 2024-06-06 DIAGNOSIS — E785 Hyperlipidemia, unspecified: Secondary | ICD-10-CM

## 2024-06-06 DIAGNOSIS — R490 Dysphonia: Secondary | ICD-10-CM | POA: Diagnosis not present

## 2024-06-06 DIAGNOSIS — R001 Bradycardia, unspecified: Secondary | ICD-10-CM

## 2024-06-06 DIAGNOSIS — I25118 Atherosclerotic heart disease of native coronary artery with other forms of angina pectoris: Secondary | ICD-10-CM | POA: Diagnosis not present

## 2024-06-06 DIAGNOSIS — K219 Gastro-esophageal reflux disease without esophagitis: Secondary | ICD-10-CM

## 2024-06-06 DIAGNOSIS — J4550 Severe persistent asthma, uncomplicated: Secondary | ICD-10-CM | POA: Diagnosis not present

## 2024-06-06 DIAGNOSIS — I200 Unstable angina: Secondary | ICD-10-CM

## 2024-06-06 DIAGNOSIS — I77811 Abdominal aortic ectasia: Secondary | ICD-10-CM

## 2024-06-06 MED ORDER — CLOTRIMAZOLE 10 MG MT TROC: 10.0000 mg | Freq: Two times a day (BID) | OROMUCOSAL | 1 refills | Status: AC

## 2024-06-06 MED ORDER — CARVEDILOL 3.125 MG PO TABS: 3.1250 mg | ORAL_TABLET | Freq: Two times a day (BID) | ORAL | 1 refills | Status: DC

## 2024-06-06 MED ORDER — ALBUTEROL SULFATE HFA 108 (90 BASE) MCG/ACT IN AERS: 2.0000 | INHALATION_SPRAY | RESPIRATORY_TRACT | 1 refills | Status: DC | PRN

## 2024-06-06 MED ORDER — IPRATROPIUM BROMIDE 0.06 % NA SOLN
2.0000 | Freq: Four times a day (QID) | NASAL | 1 refills | Status: AC
Start: 2024-06-06 — End: ?

## 2024-06-06 MED ORDER — SUCRALFATE 1 G PO TABS: 1.0000 g | ORAL_TABLET | Freq: Four times a day (QID) | ORAL | 1 refills | Status: DC

## 2024-06-06 MED ORDER — SPACER/AERO-HOLDING CHAMBERS DEVI: 1.0000 | 1 refills | Status: AC

## 2024-06-06 MED ORDER — FAMOTIDINE 40 MG PO TABS: 40.0000 mg | ORAL_TABLET | Freq: Every day | ORAL | 1 refills | Status: AC | PRN

## 2024-06-06 MED ORDER — ALBUTEROL SULFATE (2.5 MG/3ML) 0.083% IN NEBU: 2.5000 mg | INHALATION_SOLUTION | RESPIRATORY_TRACT | 1 refills | Status: AC | PRN

## 2024-06-06 MED ORDER — NEBULIZER MISC: 1.0000 | 1 refills | Status: AC

## 2024-06-06 MED ORDER — BREZTRI AEROSPHERE 160-9-4.8 MCG/ACT IN AERO: 2.0000 | INHALATION_SPRAY | Freq: Two times a day (BID) | RESPIRATORY_TRACT | 1 refills | Status: AC

## 2024-06-06 NOTE — Patient Instructions (Addendum)
  1. Continue Tezepelumab  injections every 4 weeks  2. Continue famotidine  40 mg - 1 time per day  3. Continue the following if needed:  A. Albuterol  HFA - 2 inhalations every 4-6 hours B. Ipratropium 0.06% - 1-2 sprays each nostril every 6 hours   C. OTC antihistamine - claritin  / allegra / zyrtec  D. Breztri  - 1-2 inhalations 1-2 times per day w/ spacer  E. Clortrimazole troche after Breztri  use    4. For this reflux flare:  A. Increase famotidine   - 2 times per day B. Start Carafate  1 gm - 4 times per day with meals  5. Evaluation of throat with ENT for hoarseness   6. Return to clinic in 6 months or earlier if problem

## 2024-06-06 NOTE — Telephone Encounter (Signed)
 Can we please refer patient to ENT for hoarseness per Dr. Maurilio. Thank you!

## 2024-06-06 NOTE — Progress Notes (Signed)
 Hypertension Clinic Follow Up:    Date:  06/06/2024   ID:  Louis Perez, DOB [birth year], MRN [id number]  PCP:  [facility]  Cardiologist:  Vinie JAYSON Maxcy, MD   Referring MD: Karenann Helms*   CC: Hypertension  History of Present Illness:    Louis Perez is a 73 y.o. male with a hx of CAD status post PCI, hypertension, hyperlipidemia, ascending aortic aneurysm, and sinus bradycardia here for follow-up.  He saw Dr. Maxcy 03/2023 and noted labile blood pressures including hypotension.  He was referred for sleep study and to the advanced hypertension clinic.  He was seen in clinic 04/2023 and had exertional dyspnea and chest pain.  He underwent left heart cath that revealed nonobstructive disease in the ramus, RCA, PDA, and distal LAD.  His LAD stent was patent.  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  LVEF was 55 to 60%.  LVEDP was normal.  His symptoms were not thought to be anginal.  He was seen in the advanced hypertension clinic 05/2023.  He reported being initially diagnosed with hypertension in his 72s.  Home blood pressure readings were in the 110s over 60s when he was seen.  Catecholamines, metanephrines, and TSH were unremarkable.  His hypotension was resolved with adjusting telmisartan.  Renal Dopplers were - 03/2022.  He last saw Reche Finder, NP 11/2023.  At that time he had to take some as needed hydralazine  for occasional SBP's in the 180s.  Discussed the use of AI scribe software for clinical note transcription with the patient, who gave verbal consent to proceed.  History of Present Illness Louis Perez experiences fluctuating blood pressure readings, ranging from 90/65 to 190/122, often accompanied by headaches. Hypertension has been present since age 26, and he has been on medication for it. Recently, he has not been monitoring his blood pressure as frequently.  He describes chest tightness and dizziness,  particularly when bending over, standing back up, or walking uphill. This tightness is not painful but is accompanied by a feeling of tiredness. Previously, he could walk up to five miles a day, but has reduced activity due to back pain and leg issues.  He has a history of coronary artery disease and is currently on aspirin , long-acting nitroglycerin  (Imdur ), carvedilol , and a study drug for cholesterol. He takes hydralazine  as needed when his blood pressure reaches around 160, though he has not needed it recently.  He reports arthritis, particularly in his shoulder and wrist, causing pain and swelling. He receives injections in his shoulder every three months and takes meloxicam twice daily, which has reduced the need for injections.  He has an Itamar that he has not yet used yet.  He needs help getting it set up. He also reports weight loss from 215 to 205 pounds, despite no change in appetite and continued consumption of sweets.  No swelling in his legs or feet, and he has not noticed any blood or dark stools.   Past Medical History:  Diagnosis Date   Arthritis    hips, lower back, knees, shoulders, hands (05/14/2016)   Asthma dx'd in 2014   Bacterial septicemia (HCC) 01/2011   both knee involved   CAD (coronary artery disease)    a. NSTEMI 02/2016 s/p DES to LAD. b. Relook cath due to angina/prior high risk anatomy 05/14/16 -> continued medical therapy given concern that a stent strut could inferere with Cx itself.   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    NSTEMI (non-ST elevated myocardial infarction) (HCC) 02/2016   thelbert 03/02/2016   Recurrent upper respiratory infection (URI)    Sinus bradycardia    Unspecified tinnitus    right    Past Surgical History:  Procedure Laterality Date   BACK SURGERY     CARDIAC CATHETERIZATION N/A 03/01/2016   Procedure: Left Heart Cath and Coronary  Angiography;  Surgeon: Ozell Fell, MD;  oLAD 95%, CFX/RI/RCA/RPDA 25-30%   CARDIAC CATHETERIZATION N/A 03/01/2016   Procedure: Coronary Stent Intervention;  Surgeon: Ozell Fell, MD;  Promus Premier 4.0 x  12 mm DES to oLAD   CARDIAC CATHETERIZATION N/A 05/14/2016   Procedure: Left Heart Cath and Coronary Angiography;  Surgeon: Debby DELENA Sor, MD;  Location: [facility] INVASIVE CV LAB;  Service: Cardiovascular;  Laterality: N/A;   CARDIAC CATHETERIZATION N/A 06/17/2016   Procedure: Intravascular Pressure Wire/FFR Study;  Surgeon: Ozell Fell, MD;  Location: [facility] INVASIVE CV LAB;  Service: Cardiovascular;  Laterality: N/A;   CARDIOVASCULAR STRESS TEST  05/26/2012   EKG negative for ischemia, no ECG changes, no significant ischemia noted   I & D KNEE WITH POLY EXCHANGE Bilateral 01/2011   & complete synovectomy of 3 compartments of the right total knee/notes 02/04/2011 (replacement,partial knee replacement because of bacterial infection)   JOINT REPLACEMENT     KNEE ARTHROSCOPY Right ~ 2002   LEFT HEART CATH AND CORONARY ANGIOGRAPHY N/A 04/28/2023   Procedure: LEFT HEART CATH AND CORONARY ANGIOGRAPHY;  Surgeon: Anner Alm ORN, MD;  Location: [facility] INVASIVE CV LAB;  Service: Cardiovascular;  Laterality: N/A;   LUMBAR DISC SURGERY  ~ 1980; 2000   NASAL SINUS SURGERY  08/2011   SHOULDER ARTHROSCOPY W/ ROTATOR CUFF REPAIR Right 01/2015   TOTAL KNEE ARTHROPLASTY Bilateral 03/26/2004-09/15/2004    right-left   TRANSTHORACIC ECHOCARDIOGRAM  05/26/2012   EF >55%, mild concentric LVH    Current Medications: Current Meds  Medication Sig   albuterol  (PROVENTIL ) (2.5 MG/3ML) 0.083% nebulizer solution Take 3 mLs (2.5 mg total) by nebulization every 4 (four) hours as needed for wheezing or shortness of breath.   albuterol  (VENTOLIN  HFA) 108 (90 Base) MCG/ACT inhaler Inhale 2 puffs into the lungs every 4 (four) hours as needed for wheezing or shortness of breath.   aspirin  81 MG tablet Take 81 mg by mouth at bedtime.     Aspirin -Acetaminophen -Caffeine (GOODY HEADACHE PO) Take 1 packet by mouth daily as needed (pain).   Budeson-Glycopyrrol-Formoterol  (BREZTRI  AEROSPHERE) 160-9-4.8 MCG/ACT AERO Inhale 2 puffs into the lungs in the morning and at bedtime.   cetirizine  (ZYRTEC ) 10 MG tablet TAKE 1 TABLET BY MOUTH ONCE DAILY AS NEEDED FOR ALLERGIES (CAN TAKE AN EXTRA DOSE DURING FLARE UPS)   clotrimazole  (MYCELEX ) 10 MG troche Take 1 tablet (10 mg total) by mouth 2 (two) times daily. Use after taking Breztri    Coenzyme Q10 (COQ10) 400 MG CAPS Take 400 mg by mouth daily.   famotidine  (PEPCID ) 40 MG tablet Take 1 tablet (40 mg total) by mouth daily.   hydrALAZINE  (APRESOLINE ) 25 MG tablet Take 1 tablet (25 mg total) by mouth as needed (as needed for systolic blood pressure more than 160).   ipratropium (ATROVENT ) 0.06 % nasal spray 1-2 sprays each nostril every 6 hours to dry nose   isosorbide  mononitrate (IMDUR ) 60 MG 24 hr tablet Take 1 tablet (60 mg total) by mouth daily.  levocetirizine (XYZAL ) 5 MG tablet TAKE 1 TABLET BY MOUTH TWICE DAILY AS NEEDED CAN  TAKE  AN  EXTRA  DOSE  DURING  FLARE  UPS   meloxicam (MOBIC) 7.5 MG tablet Take 7.5 mg by mouth 2 (two) times daily.   nitroGLYCERIN  (NITROSTAT ) 0.4 MG SL tablet Place 1 tablet (0.4 mg total) under the tongue every 5 (five) minutes as needed for chest pain.   olmesartan  (BENICAR ) 20 MG tablet Take 1 tablet (20 mg total) by mouth daily.   Spacer/Aero-Holding Chambers DEVI 1 Device by Does not apply route as directed.   Study - ORION 4 - inclisiran 300 mg/1.5mL or placebo SQ injection (PI-Stuckey) Inject 300 mg into the skin every 6 (six) months. For Investigational Use Only. Please alert research coordinator of change/addition of any lipid lowering agent or lipid panels drawn per research protocol. ORION 4 STUDY MEDICATION - inclisiran 300 mg/1.5 mL or placebo SQ injection (PI-Stuckey)   [DISCONTINUED] carvedilol  (COREG ) 6.25 MG tablet Take 1 tablet (6.25 mg total)  by mouth 2 (two) times daily.   Current Facility-Administered Medications for the 06/06/24 encounter (Office Visit) with Raford Riggs, MD  Medication   Study - ORION 4 - inclisiran 300 mg/1.45mL or placebo SQ injection (PI-Stuckey)   tezepelumab -ekko (TEZSPIRE ) 210 MG/1. syringe 210 mg     Allergies:   Oxycodone and Ambien [zolpidem tartrate]   Social History   Socioeconomic History   Marital status: Married    Spouse name: Not on file   Number of children: 2   Years of education: 14   Highest education level: Not on file  Occupational History   Occupation: retired Architect, part-time security work  Tobacco Use   Smoking status: Former    Current packs/day: 0.00    Average packs/day: 1 pack/day for 5.0 years (5.0 ttl pk-yrs)    Types: Cigarettes    Start date: 12/15/1963    Quit date: 12/14/1968    Years since quitting: 55.5   Smokeless tobacco: Never  Vaping Use   Vaping status: Never Used  Substance and Sexual Activity   Alcohol use: Yes    Alcohol/week: 1.0 standard drink of alcohol    Types: 1 Cans of beer per week    Comment: Occasional   Drug use: No   Sexual activity: Not Currently    Partners: Female    Comment: Married  Other Topics Concern   Not on file  Social History Narrative   Patient lives at home with his wife . He is retired. Patient has 14 years of education. Patient has two children.   Caffeine - two cups daily.   Right-handed   Social Drivers of Corporate investment banker Strain: Low Risk  (05/20/2023)   Overall Financial Resource Strain (CARDIA)    Difficulty of Paying Living Expenses: Not hard at all  Food Insecurity: Low Risk  (01/19/2024)   Received from [facility]   Hunger Vital Sign    Within the past 12 months, you worried that your food would run out before you got money to buy more: Never true    Within the past 12 months, the food you bought just didn't last and you didn't have money to get more. : Never true   Transportation Needs: No Transportation Needs (01/19/2024)   Received from [facility]    In the past 12 months, has lack of reliable transportation kept you from medical appointments, meetings, work or from getting things needed  for daily living? : No  Physical Activity: Sufficiently Active (05/20/2023)   Exercise Vital Sign    Days of Exercise per Week: 7 days    Minutes of Exercise per Session: 30 min  Stress: No Stress Concern Present (05/20/2023)   [employer] of Occupational Health - Occupational Stress Questionnaire    Feeling of Stress : Not at all  Social Connections: Socially Integrated (05/20/2023)   Social Connection and Isolation Panel    Frequency of Communication with Friends and Family: More than three times a week    Frequency of Social Gatherings with Friends and Family: More than three times a week    Attends Religious Services: More than 4 times per year    Active Member of [employer] or Organizations: Yes    Attends Engineer, structural: More than 4 times per year    Marital Status: Married     Family History: The patient's family history includes Alzheimer's disease in his father; Cancer in his paternal aunt, paternal uncle, paternal uncle, and paternal uncle; Dementia in his father; Heart disease in his mother; Heart disease (age of onset: 55) in his brother.  ROS:   Please see the history of present illness.    All other systems reviewed and are negative.  EKGs/Labs/Other Studies Reviewed:    EKG:  EKG is not ordered today.     EKG Interpretation Date/Time:    Ventricular Rate:    PR Interval:    QRS Duration:    QT Interval:    QTC Calculation:   R Axis:      Text Interpretation:         LHC 04/28/23:   Previously placed Ost LAD to Prox LAD drug-eluting stent is  widely patent.   Ramus lesion is 55% stenosed.   Dist LAD lesion is 40% stenosed.  Mild myocardial bridging   Prox RCA to Mid RCA lesion is 25% stenosed.    RPDA lesion is 55% stenosed.   --------------------------------------   The left ventricular systolic function is normal.  The left ventricular ejection fraction is 55-65% by visual estimate.   LV end diastolic pressure is normal.   3-vessel disease with widely patent LAD stent, improved/stabilization of ostial RI lesion when compared to prior catheterization, and ostial PDA 55%. Normal LVEF 55 to 60% with normal EDP.     RECOMMENDATIONS: Would consider nonanginal/noncardiac etiology for dyspnea.    Recent Labs: No results found for requested labs within last 365 days.   Recent Lipid Panel    Component Value Date/Time   CHOL 173 06/26/2020 0927   CHOL 136 01/08/2017 0833   TRIG 103 06/26/2020 0927   TRIG 109 01/08/2017 0833   HDL 54 06/26/2020 0927   HDL 53 01/08/2017 0833   CHOLHDL 3.2 06/26/2020 0927   CHOLHDL 2.6 01/08/2017 0833   CHOLHDL 3.0 03/01/2016 0448   VLDL 17 03/01/2016 0448   LDLCALC 100 (H) 06/26/2020 0927   LDLCALC 64 01/08/2017 0833    Physical Exam:    VS:  BP 112/73 (BP Location: Right Arm, Patient Position: Sitting, Cuff Size: Normal)   Pulse 68   Resp 16   Ht 6' 3 (1.905 m)   Wt 205 lb 9.6 oz (93.3 kg)   SpO2 97%   BMI 25.70 kg/m  , BMI Body mass index is 25.7 kg/m. GENERAL:  Well appearing HEENT: Pupils equal round and reactive, fundi not visualized, oral mucosa unremarkable NECK:  No jugular venous distention, waveform within normal  limits, carotid upstroke brisk and symmetric, no bruits, no thyromegaly LUNGS:  Clear to auscultation bilaterally HEART:  RRR.  PMI not displaced or sustained,S1 and S2 within normal limits, no S3, no S4, no clicks, no rubs, no murmurs ABD:  Flat, positive bowel sounds normal in frequency in pitch, no bruits, no rebound, no guarding, no midline pulsatile mass, no hepatomegaly, no splenomegaly EXT:  2 plus pulses throughout, no edema, no cyanosis no clubbing SKIN:  No rashes no nodules NEURO:  Cranial nerves II  through XII grossly intact, motor grossly intact throughout PSYCH:  Cognitively intact, oriented to person place and time   ASSESSMENT/PLAN:    Assessment & Plan # CAD:  # Chest tightness and dizziness on exertion # Hyperlipidemia: Intermittent symptoms likely due to blood pressure fluctuations and inadequate cardiac output. Patient reports occasional symptoms of lightheadedness when walking uphill and dizziness, which started six months ago, after last year's heart catheterization.    - Reduce carvedilol  dosage to potentially improve cardiac output during exertion.  If symptoms worsen, consider cardiac PET or repeat cath.  - Continue aspirin , Imdur  and study lipid medication  # Hypertension Fluctuating blood pressure, possibly influenced by recent weight loss. Current medications include carvedilol , olmesartan , and hydralazine  as needed. Blood pressure more stable but not optimal.  Low here today and often low at home.  - Reduce carvedilol  dosage to 3.125 mg twice daily. - Monitor blood pressure twice daily for two weeks. - Follow up in two months to reassess blood pressure control.  # Osteoarthritis  Chronic condition managed with meloxicam, reducing need for corticosteroid injections. No impact of meloxicam on blood pressure.   Disposition:   f/u in 2 months    Medication Adjustments/Labs and Tests Ordered: Current medicines are reviewed at length with the patient today.  Concerns regarding medicines are outlined above.  No orders of the defined types were placed in this encounter.  Meds ordered this encounter  Medications   carvedilol  (COREG ) 3.125 MG tablet    Sig: Take 1 tablet (3.125 mg total) by mouth 2 (two) times daily.    Dispense:  90 tablet    Refill:  1     Signed, Annabella Scarce, MD  06/06/2024 10:12 AM    [facility]

## 2024-06-06 NOTE — Progress Notes (Unsigned)
 [place] - [place] - [place] - [place] - [place]   Follow-up Note  Referring Provider: Karenann Helms* Primary Provider: [facility] Date of Office Visit: 06/06/2024  Subjective:   Louis Perez (DOB: [birth year]) is a 73 y.o. male who [facility]24 in re-evaluation of the following:  HPI: Ed returns to this clinic in evaluation of asthma, allergic rhinitis, LPR.  I last saw him in this clinic 28 September 2023.  He continues on tezepelumab  injections which is resulted in excellent control of his respiratory tract disease.  He has eliminated the use of Breztri  and he has no need to use any albuterol  and he can exert himself without any problem.  His nose is doing quite well while intermittently using some nasal ipratropium to dry up his nose.  He has not required a systemic steroid or antibiotic for any type of airway issue.  He had a problem with his reflux.  He Mr. His famotidine  for about 2 weeks and developed extreme burning in his chest and he restarted his famotidine  over the course of the past 2 weeks but said he still having problems when he eats.  He has been developing progressive hoarseness and intermittent loss of voice over the course of the past several months.  Allergies as of 06/06/2024       Reactions   Oxycodone Anaphylaxis, Swelling   Ambien [zolpidem Tartrate] Other (See Comments)   Mood changes         Medication List    albuterol  (2.5 MG/3ML) 0.083% nebulizer solution Commonly known as: PROVENTIL  Take 3 mLs (2.5 mg total) by nebulization every 4 (four) hours as needed for wheezing or shortness of breath.   albuterol  108 (90 Base) MCG/ACT inhaler Commonly known as: Ventolin  HFA Inhale 2 puffs into the lungs every 4 (four) hours as needed for wheezing or shortness of breath.   aspirin  81 MG tablet Take 81 mg by mouth at bedtime.   Breztri  Aerosphere  160-9-4.8 MCG/ACT Aero inhaler Generic drug: budesonide -glycopyrrolate-formoterol  Inhale 2 puffs into the lungs in the morning and at bedtime.   carvedilol  3.125 MG tablet Commonly known as: COREG  Take 1 tablet (3.125 mg total) by mouth 2 (two) times daily.   cetirizine  10 MG tablet Commonly known as: ZYRTEC  TAKE 1 TABLET BY MOUTH ONCE DAILY AS NEEDED FOR ALLERGIES (CAN TAKE AN EXTRA DOSE DURING FLARE UPS)   famotidine  40 MG tablet Commonly known as: PEPCID  Take 1 tablet (40 mg total) by mouth daily.   GOODY HEADACHE PO Take 1 packet by mouth daily as needed (pain).   hydrALAZINE  25 MG tablet Commonly known as: APRESOLINE  Take 1 tablet (25 mg total) by mouth as needed (as needed for systolic blood pressure more than 160).   ipratropium 0.06 % nasal spray Commonly known as: ATROVENT  1-2 sprays each nostril every 6 hours to dry nose   isosorbide  mononitrate 60 MG 24 hr tablet Commonly known as: IMDUR  Take 1 tablet (60 mg total) by mouth daily.   levocetirizine 5 MG tablet Commonly known as: XYZAL  TAKE 1 TABLET BY MOUTH TWICE DAILY AS NEEDED CAN  TAKE  AN  EXTRA  DOSE  DURING  FLARE  UPS   meloxicam 7.5 MG tablet Commonly known as: MOBIC Take 7.5 mg by mouth 2 (two) times daily.   nitroGLYCERIN  0.4 MG SL tablet Commonly known as: NITROSTAT  Place 1 tablet (0.4 mg total) under the tongue every 5 (five)  minutes as needed for chest pain.   olmesartan  20 MG tablet Commonly known as: BENICAR  Take 1 tablet (20 mg total) by mouth daily.   ORION 4 inclisiran or placebo 300 mg/1.5 mL SQ injection Inject 300 mg into the skin every 6 (six) months. For Investigational Use Only. Please alert research coordinator of change/addition of any lipid lowering agent or lipid panels drawn per research protocol. ORION 4 STUDY MEDICATION - inclisiran 300 mg/1.5 mL or placebo SQ injection (PI-Stuckey)   Spacer/Aero-Holding Raguel French 1 Device by Does not apply route as directed.    Past  Medical History:  Diagnosis Date   Arthritis    hips, lower back, knees, shoulders, hands (05/14/2016)   Asthma dx'd in 2014   Bacterial septicemia (HCC) 01/2011   both knee involved   CAD (coronary artery disease)    a. NSTEMI 02/2016 s/p DES to LAD. b. Relook cath due to angina/prior high risk anatomy 05/14/16 -> continued medical therapy given concern that a stent strut could inferere with Cx itself.   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    NSTEMI (non-ST elevated myocardial infarction) (HCC) 02/2016   thelbert 03/02/2016   Recurrent upper respiratory infection (URI)    Sinus bradycardia    Unspecified tinnitus    right    Past Surgical History:  Procedure Laterality Date   BACK SURGERY     CARDIAC CATHETERIZATION N/A 03/01/2016   Procedure: Left Heart Cath and Coronary Angiography;  Surgeon: Ozell Fell, MD;  oLAD 95%, CFX/RI/RCA/RPDA 25-30%   CARDIAC CATHETERIZATION N/A 03/01/2016   Procedure: Coronary Stent Intervention;  Surgeon: Ozell Fell, MD;  Promus Premier 4.0 x  12 mm DES to oLAD   CARDIAC CATHETERIZATION N/A 05/14/2016   Procedure: Left Heart Cath and Coronary Angiography;  Surgeon: Debby DELENA Sor, MD;  [facility] INVASIVE CV LAB;  Service: Cardiovascular;  Laterality: N/A;   CARDIAC CATHETERIZATION N/A 06/17/2016   Procedure: Intravascular Pressure Wire/FFR Study;  Surgeon: Ozell Fell, MD;  Location: [facility] INVASIVE CV LAB;  Service: Cardiovascular;  Laterality: N/A;   CARDIOVASCULAR STRESS TEST  05/26/2012   EKG negative for ischemia, no ECG changes, no significant ischemia noted   I & D KNEE WITH POLY EXCHANGE Bilateral 01/2011   & complete synovectomy of 3 compartments of the right total knee/notes 02/04/2011 (replacement,partial knee replacement because of bacterial infection)   JOINT REPLACEMENT     KNEE ARTHROSCOPY Right ~ 2002   LEFT HEART CATH AND CORONARY  ANGIOGRAPHY N/A 04/28/2023   Procedure: LEFT HEART CATH AND CORONARY ANGIOGRAPHY;  Surgeon: Anner Alm ORN, MD;  Location: [facility] INVASIVE CV LAB;  Service: Cardiovascular;  Laterality: N/A;   LUMBAR DISC SURGERY  ~ 1980; 2000   NASAL SINUS SURGERY  08/2011   SHOULDER ARTHROSCOPY W/ ROTATOR CUFF REPAIR Right 01/2015   TOTAL KNEE ARTHROPLASTY Bilateral 03/26/2004-09/15/2004    right-left   TRANSTHORACIC ECHOCARDIOGRAM  05/26/2012   EF >55%, mild concentric LVH    Review of systems negative except as noted in HPI / PMHx or noted below:  Review of Systems  Constitutional: Negative.   HENT: Negative.    Eyes: Negative.   Respiratory: Negative.    Cardiovascular: Negative.   Gastrointestinal: Negative.   Genitourinary: Negative.   Musculoskeletal: Negative.   Skin: Negative.   Neurological: Negative.   Endo/Heme/Allergies: Negative.   Psychiatric/Behavioral: Negative.  Objective:   Vitals:   06/06/24 1037  BP: 118/82  Pulse: 78  Resp: 14  Temp: 97.6 F (36.4 C)  SpO2: 95%   Height: 6' 3 (190.5 cm)      Physical Exam Constitutional:      Appearance: He is not diaphoretic.  HENT:     Head: Normocephalic.     Right Ear: Tympanic membrane, ear canal and external ear normal.     Left Ear: Tympanic membrane, ear canal and external ear normal.     Nose: Nose normal. No mucosal edema or rhinorrhea.     Mouth/Throat:     Pharynx: Uvula midline. No oropharyngeal exudate.   Eyes:     Conjunctiva/sclera: Conjunctivae normal.   Neck:     Thyroid: No thyromegaly.     Trachea: Trachea normal. No tracheal tenderness or tracheal deviation.   Cardiovascular:     Rate and Rhythm: Normal rate and regular rhythm.     Heart sounds: S1 normal and S2 normal. Murmur (systolic) heard.  Pulmonary:     Effort: No respiratory distress.     Breath sounds: Normal breath sounds. No stridor. No wheezing or rales.  Lymphadenopathy:     Head:     Right side of head: No tonsillar  adenopathy.     Left side of head: No tonsillar adenopathy.     Cervical: No cervical adenopathy.   Skin:    Findings: No erythema or rash.     Nails: There is no clubbing.   Neurological:     Mental Status: He is alert.     Diagnostics: Spirometry was performed and demonstrated an FEV1 of 2.46 at 68 % of predicted.  Assessment and Plan:   1. Asthma, severe persistent, well-controlled   2. Other allergic rhinitis   3. LPRD (laryngopharyngeal reflux disease)   4. Hoarseness    1. Continue Tezepelumab  injections every 4 weeks  2. Continue famotidine  40 mg - 1 time per day  3. Continue the following if needed:  A. Albuterol  HFA - 2 inhalations every 4-6 hours B. Ipratropium 0.06% - 1-2 sprays each nostril every 6 hours   C. OTC antihistamine - claritin  / allegra / zyrtec  D. Breztri  - 1-2 inhalations 1-2 times per day w/ spacer  E. Clortrimazole troche after Breztri  use    4. For this reflux flare:  A. Increase famotidine   - 2 times per day B. Start Carafate  1 gm - 4 times per day with meals  5. Evaluation of throat with ENT for hoarseness   6. Return to clinic in 6 months or earlier if problem  Ed appears to be doing pretty well while using anti-TSLP antibody to control his respiratory tract disease and he will remain on this biologic agent and he has a selection of other agents to utilize should they be required as noted above.  I am going to treat what appears to be some esophageal dysfunction from missing his famotidine  for 2 weeks with the plan noted above and hopefully that will take care of that issue and he will go back to controlling his reflux.  He is had problems with hoarseness that has developed and loss of voice that is developed over the course of the past few months and we will have ENT take a look at his throat.  Camellia Denis, MD Allergy  / Immunology [facility]

## 2024-06-06 NOTE — Patient Instructions (Signed)
 Medication Instructions:  Your physician has recommended you make the following change in your medication:  DECREASE Carvedilol  to 3.125 mg   Follow-Up: Follow up in 2 months in [facility] CLINIC with Dr. Raford, Reche Finder, NP or PharmD.   Special Instructions:  Track your blood pressures for 2 weeks and send us  your readings.

## 2024-06-07 ENCOUNTER — Encounter (INDEPENDENT_AMBULATORY_CARE_PROVIDER_SITE_OTHER): Payer: Self-pay

## 2024-06-07 ENCOUNTER — Encounter: Payer: Self-pay | Admitting: Allergy and Immunology

## 2024-06-13 ENCOUNTER — Ambulatory Visit

## 2024-06-13 DIAGNOSIS — J4550 Severe persistent asthma, uncomplicated: Secondary | ICD-10-CM

## 2024-06-27 ENCOUNTER — Other Ambulatory Visit: Payer: Self-pay | Admitting: Orthopaedic Surgery

## 2024-06-27 ENCOUNTER — Ambulatory Visit
Admission: RE | Admit: 2024-06-27 | Discharge: 2024-06-27 | Disposition: A | Discharge status: Home / Self Care | Source: Ambulatory Visit | Attending: Orthopaedic Surgery

## 2024-06-27 DIAGNOSIS — G8929 Other chronic pain: Secondary | ICD-10-CM

## 2024-06-30 ENCOUNTER — Encounter (INDEPENDENT_AMBULATORY_CARE_PROVIDER_SITE_OTHER): Payer: Self-pay

## 2024-06-30 NOTE — Telephone Encounter (Signed)
 [facility] called Ed 3 times and left voicemails and sent letters.  Ed never responded.  They closed out his referral.

## 2024-07-03 ENCOUNTER — Other Ambulatory Visit: Payer: Self-pay | Admitting: Allergy and Immunology

## 2024-07-11 ENCOUNTER — Ambulatory Visit

## 2024-07-18 ENCOUNTER — Ambulatory Visit (INDEPENDENT_AMBULATORY_CARE_PROVIDER_SITE_OTHER)

## 2024-07-18 DIAGNOSIS — J4550 Severe persistent asthma, uncomplicated: Secondary | ICD-10-CM | POA: Diagnosis not present

## 2024-07-19 ENCOUNTER — Telehealth: Payer: Self-pay

## 2024-07-19 NOTE — Telephone Encounter (Signed)
 Dr. Raford  You saw this patient on 06/06/2024. Per protocol we request that you comment on his cardiac risk to proceed with LEFT REVERSE TOTAL SHOULDER ARTHROPLASTY scheduled for TBD, since it has been less than 2 months since evaluated in the office. Please send your comment to P CV Pre-Op Pool.  Thank you, Lamarr Satterfield DNP, ANP, AACC.

## 2024-07-19 NOTE — Telephone Encounter (Signed)
   Pre-operative Risk Assessment    Patient Name: Louis Perez  DOB: May 10, 1951 MRN: [id number]   Date of last office visit: 06/06/24 ANNABELLA SCARCE, MD Date of next office visit: 08/17/24 CAITLIN WALKER, NP   Request for Surgical Clearance    Procedure:  LEFT REVERSE TOTAL SHOULDER ARTHROPLASTY  Date of Surgery:  Clearance TBD                                Surgeon:  BONNER HAIR, MD Surgeon's Group or Practice Name:  [facility] Phone number:  [phone]  EXT 3132 Fax number:  [phone]  ATTN: MAEOLA DIVERS   Type of Clearance Requested:   - Medical  - Pharmacy:  Hold Aspirin      Type of Anesthesia:  General WITH INTERSCALENE BLOCK   Additional requests/questions:    SignedLucie DELENA Ku   07/19/2024, 11:41 AM

## 2024-07-20 NOTE — Telephone Encounter (Signed)
   Name: Louis Perez  DOB: [birth year]  MRN: [id number]  Primary Cardiologist: Vinie JAYSON Maxcy, MD  Chart reviewed as part of pre-operative protocol coverage. Because of Louis Perez's past medical history and time since last visit, he will require a follow-up telephone visit in order to better assess preoperative cardiovascular risk.  Pre-op covering staff: - Please schedule appointment and call patient to inform them. If patient already had an upcoming appointment within acceptable timeframe, please add pre-op clearance to the appointment notes so provider is aware. - Please contact requesting surgeon's office via preferred method (i.e, phone, fax) to inform them of need for appointment prior to surgery.  If patient is asymptomatic at the time of phone call he can hold his aspirin  x 5 to 7 days prior to procedure and resume when medically safe to do so.  Louis LOISE Fabry, PA-C  07/20/2024, 4:18 PM

## 2024-07-21 NOTE — Telephone Encounter (Signed)
 Patient is scheduled for a tele visit on 07/26/24 with Katlyn West, NP.

## 2024-07-21 NOTE — Telephone Encounter (Signed)
  Patient Consent for Virtual Visit        Louis Perez has provided verbal consent on 07/21/2024 for a virtual visit (video or telephone).   CONSENT FOR VIRTUAL VISIT FOR:  Louis Perez  By participating in this virtual visit I agree to the following:  I hereby voluntarily request, consent and authorize [facility] and its employed or contracted physicians, physician assistants, nurse practitioners or other licensed health care professionals (the Practitioner), to provide me with telemedicine health care services (the "Services) as deemed necessary by the treating Practitioner. I acknowledge and consent to receive the Services by the Practitioner via telemedicine. I understand that the telemedicine visit will involve communicating with the Practitioner through live audiovisual communication technology and the disclosure of certain medical information by electronic transmission. I acknowledge that I have been given the opportunity to request an in-person assessment or other available alternative prior to the telemedicine visit and am voluntarily participating in the telemedicine visit.  I understand that I have the right to withhold or withdraw my consent to the use of telemedicine in the course of my care at any time, without affecting my right to future care or treatment, and that the Practitioner or I may terminate the telemedicine visit at any time. I understand that I have the right to inspect all information obtained and/or recorded in the course of the telemedicine visit and may receive copies of available information for a reasonable fee.  I understand that some of the potential risks of receiving the Services via telemedicine include:  Delay or interruption in medical evaluation due to technological equipment failure or disruption; Information transmitted may not be sufficient (e.g. poor resolution of images) to allow for appropriate medical decision making by the  Practitioner; and/or  In rare instances, security protocols could fail, causing a breach of personal health information.  Furthermore, I acknowledge that it is my responsibility to provide information about my medical history, conditions and care that is complete and accurate to the best of my ability. I acknowledge that Practitioner's advice, recommendations, and/or decision may be based on factors not within their control, such as incomplete or inaccurate data provided by me or distortions of diagnostic images or specimens that may result from electronic transmissions. I understand that the practice of medicine is not an exact science and that Practitioner makes no warranties or guarantees regarding treatment outcomes. I acknowledge that a copy of this consent can be made available to me via my patient portal [facility] MyChart), or I can request a printed copy by calling the office of [facility].    I understand that my insurance will be billed for this visit.   I have read or had this consent read to me. I understand the contents of this consent, which adequately explains the benefits and risks of the Services being provided via telemedicine.  I have been provided ample opportunity to ask questions regarding this consent and the Services and have had my questions answered to my satisfaction. I give my informed consent for the services to be provided through the use of telemedicine in my medical care

## 2024-07-26 ENCOUNTER — Ambulatory Visit: Attending: Cardiovascular Disease

## 2024-07-26 ENCOUNTER — Other Ambulatory Visit: Payer: Self-pay

## 2024-07-26 ENCOUNTER — Encounter (HOSPITAL_COMMUNITY): Payer: Self-pay

## 2024-07-26 ENCOUNTER — Telehealth: Payer: Self-pay

## 2024-07-26 ENCOUNTER — Encounter (HOSPITAL_COMMUNITY): Payer: Self-pay | Admitting: Cardiology

## 2024-07-26 DIAGNOSIS — Z01818 Encounter for other preprocedural examination: Secondary | ICD-10-CM

## 2024-07-26 DIAGNOSIS — Z01810 Encounter for preprocedural cardiovascular examination: Secondary | ICD-10-CM | POA: Diagnosis not present

## 2024-07-26 DIAGNOSIS — R079 Chest pain, unspecified: Secondary | ICD-10-CM

## 2024-07-26 DIAGNOSIS — I2510 Atherosclerotic heart disease of native coronary artery without angina pectoris: Secondary | ICD-10-CM

## 2024-07-26 DIAGNOSIS — R0609 Other forms of dyspnea: Secondary | ICD-10-CM

## 2024-07-26 NOTE — Progress Notes (Signed)
   Patient Name: Louis Perez  DOB: Jan 29, 1951 MRN: [id number]  Primary Cardiologist: Vinie JAYSON Maxcy, MD  Chart reviewed as part of pre-operative protocol coverage.  Patient was contacted today regarding left reverse total shoulder arthroplasty.  Patient was last seen in clinic by Dr. Raford on 06/06/2024, at that time he reported exertional symptoms that Dr. Raford felt were likely due to bradycardia and his carvedilol  was reduced.  Patient reports that while he has had improvement in symptoms he still does occasionally experience exertional chest tightness, notes this is not often and resolves with rest. Dr. Raford had recommended that if his symptoms persisted stress test would need to be pursued prior to procedure.  Discussed this recommendation with patient, he is in agreement to proceed with treadmill nuclear stress test and to follow up with Reche Finder, NP as scheduled. He also reports rare and fleeting palpitations, if more persistent can consider cardiac monitor on follow up. Patient's cardiac clearance to be addressed following stress testing.  Informed Consent   Shared Decision Making/Informed Consent The risks [chest pain, shortness of breath, cardiac arrhythmias, dizziness, blood pressure fluctuations, myocardial infarction, stroke/transient ischemic attack, nausea, vomiting, allergic reaction, radiation exposure, metallic taste sensation and life-threatening complications (estimated to be 1 in 10,000)], benefits (risk stratification, diagnosing coronary artery disease, treatment guidance) and alternatives of a nuclear stress test were discussed in detail with [name] and he agrees to proceed.      Hally Colella D Epsie Walthall, NP 07/26/2024, 10:32 AM

## 2024-07-26 NOTE — Telephone Encounter (Signed)
 [facility] called and stated fax was incorrectly sent to them instead of [facility]. Please Advise.

## 2024-07-26 NOTE — Telephone Encounter (Signed)
 Please arrange exercise nuclear test for pt per MARLA Mose NP. Thank you

## 2024-07-27 ENCOUNTER — Encounter (HOSPITAL_BASED_OUTPATIENT_CLINIC_OR_DEPARTMENT_OTHER): Admitting: Family

## 2024-08-01 ENCOUNTER — Other Ambulatory Visit: Payer: Self-pay | Admitting: Cardiology

## 2024-08-01 DIAGNOSIS — R079 Chest pain, unspecified: Secondary | ICD-10-CM

## 2024-08-01 DIAGNOSIS — I2510 Atherosclerotic heart disease of native coronary artery without angina pectoris: Secondary | ICD-10-CM

## 2024-08-01 DIAGNOSIS — Z01810 Encounter for preprocedural cardiovascular examination: Secondary | ICD-10-CM

## 2024-08-01 DIAGNOSIS — R0609 Other forms of dyspnea: Secondary | ICD-10-CM

## 2024-08-03 ENCOUNTER — Ambulatory Visit (HOSPITAL_COMMUNITY)
Admission: RE | Admit: 2024-08-03 | Discharge: 2024-08-03 | Disposition: A | Discharge status: Home / Self Care | Source: Ambulatory Visit | Attending: Cardiology | Admitting: Cardiology

## 2024-08-03 DIAGNOSIS — R0609 Other forms of dyspnea: Secondary | ICD-10-CM | POA: Insufficient documentation

## 2024-08-03 DIAGNOSIS — I2510 Atherosclerotic heart disease of native coronary artery without angina pectoris: Secondary | ICD-10-CM | POA: Diagnosis present

## 2024-08-03 DIAGNOSIS — Z01810 Encounter for preprocedural cardiovascular examination: Secondary | ICD-10-CM | POA: Insufficient documentation

## 2024-08-03 DIAGNOSIS — R079 Chest pain, unspecified: Secondary | ICD-10-CM | POA: Diagnosis present

## 2024-08-03 LAB — MYOCARDIAL PERFUSION IMAGING
LV dias vol: 96 mL (ref 62–150)
LV sys vol: 36 mL (ref 4.2–5.8)
Nuc Stress EF: 63 %
Peak HR: 88 {beats}/min
Rest HR: 63 {beats}/min
Rest Nuclear Isotope Dose: 10.3 mCi
SDS: 0
SRS: 5
SSS: 2
ST Depression (mm): 0 mm
Stress Nuclear Isotope Dose: 31.8 mCi
TID: 0.91

## 2024-08-03 MED ORDER — TECHNETIUM TC 99M TETROFOSMIN IV KIT
10.3000 | PACK | Freq: Once | INTRAVENOUS | Status: AC | PRN
  Administered 2024-08-03: 10.3 via INTRAVENOUS

## 2024-08-03 MED ORDER — REGADENOSON 0.4 MG/5ML IV SOLN
INTRAVENOUS | Status: AC
  Filled 2024-08-03: qty 5

## 2024-08-03 MED ORDER — REGADENOSON 0.4 MG/5ML IV SOLN
0.4000 mg | Freq: Once | INTRAVENOUS | Status: AC
  Administered 2024-08-03: 0.4 mg via INTRAVENOUS

## 2024-08-03 MED ORDER — TECHNETIUM TC 99M TETROFOSMIN IV KIT
31.8000 | PACK | Freq: Once | INTRAVENOUS | Status: AC | PRN
  Administered 2024-08-03: 31.8 via INTRAVENOUS

## 2024-08-08 ENCOUNTER — Ambulatory Visit: Payer: Self-pay | Admitting: Cardiology

## 2024-08-08 ENCOUNTER — Telehealth: Payer: Self-pay | Admitting: Cardiology

## 2024-08-08 NOTE — Telephone Encounter (Signed)
   Patient Name: Louis Perez  DOB: January 25, 1951 MRN: [id number]  Primary Cardiologist: Vinie JAYSON Maxcy, MD  Chart reviewed as part of pre-operative protocol coverage.   Patient underwent nuclear stress test on 08/03/2024 that was normal and low risk.  Contacted patient today to review results, he reports that he has been doing very well and denies any cardiac concerns or complaints at this time.  Given past medical history and time since last visit, based on ACC/AHA guidelines, Louis Perez is at acceptable risk for the planned procedure without further cardiovascular testing.   Regarding ASA therapy, we recommend continuation of ASA throughout the perioperative period.  However, if the surgeon feels that cessation of ASA is required in the perioperative period, it may be stopped 5-7 days prior to surgery with a plan to resume it as soon as felt to be feasible from a surgical standpoint in the post-operative period.   The patient was advised that if he develops new symptoms prior to surgery to contact our office to arrange for a follow-up visit, and he verbalized understanding.  I will route this recommendation to the requesting party via Epic fax function and remove from pre-op pool.  Please call with questions.  Parminder Cupples D Jadyn Brasher, NP 08/08/2024, 3:46 PM

## 2024-08-08 NOTE — Progress Notes (Signed)
   Patient Name: Louis Perez  DOB: July 05, 1951 MRN: [id number]  Primary Cardiologist: Vinie JAYSON Maxcy, MD  Chart reviewed as part of pre-operative protocol coverage.   Patient underwent nuclear stress test on 08/03/2024, study noted be normal and low risk.  Called patient today to discuss results, he reports that he is doing very well overall.  He denies any cardiac concerns or complaints at this time.  Given past medical history and time since last visit, based on ACC/AHA guidelines, Louis Perez is at acceptable risk for the planned procedure without further cardiovascular testing.   Regarding ASA therapy, we recommend continuation of ASA throughout the perioperative period.  However, if the surgeon feels that cessation of ASA is required in the perioperative period, it may be stopped 5-7 days prior to surgery with a plan to resume it as soon as felt to be feasible from a surgical standpoint in the post-operative period.   The patient was advised that if he develops new symptoms prior to surgery to contact our office to arrange for a follow-up visit, and he verbalized understanding.  I will route this recommendation to the requesting party via Epic fax function and remove from pre-op pool.  Please call with questions.  Halley Shepheard D Estephani Popper, NP 08/08/2024, 3:41 PM

## 2024-08-15 ENCOUNTER — Ambulatory Visit (INDEPENDENT_AMBULATORY_CARE_PROVIDER_SITE_OTHER)

## 2024-08-15 DIAGNOSIS — J4550 Severe persistent asthma, uncomplicated: Secondary | ICD-10-CM | POA: Diagnosis not present

## 2024-08-17 ENCOUNTER — Encounter (HOSPITAL_BASED_OUTPATIENT_CLINIC_OR_DEPARTMENT_OTHER): Payer: Self-pay | Admitting: Family

## 2024-08-17 ENCOUNTER — Ambulatory Visit (HOSPITAL_BASED_OUTPATIENT_CLINIC_OR_DEPARTMENT_OTHER): Admitting: Family

## 2024-08-17 ENCOUNTER — Encounter (HOSPITAL_BASED_OUTPATIENT_CLINIC_OR_DEPARTMENT_OTHER): Payer: Self-pay

## 2024-08-17 DIAGNOSIS — I10 Essential (primary) hypertension: Secondary | ICD-10-CM

## 2024-08-17 DIAGNOSIS — I25118 Atherosclerotic heart disease of native coronary artery with other forms of angina pectoris: Secondary | ICD-10-CM

## 2024-08-17 DIAGNOSIS — E785 Hyperlipidemia, unspecified: Secondary | ICD-10-CM

## 2024-08-17 MED ORDER — CARVEDILOL 3.125 MG PO TABS
3.1250 mg | ORAL_TABLET | Freq: Two times a day (BID) | ORAL | 1 refills | Status: AC
Start: 2024-08-17 — End: ?

## 2024-08-17 NOTE — Progress Notes (Signed)
 Advanced Hypertension Clinic Assessment:    Date:  08/17/2024   ID:  Louis Perez, DOB 1951/08/28, MRN [id number]  PCP:  Macarthur Elouise SQUIBB, DO  Cardiologist:  Vinie JAYSON Maxcy, MD  Nephrologist:  Referring MD: Karenann Helms*   CC: Hypertension  History of Present Illness:    Louis Perez is a 73 y.o. male with a hx of hypertension, CAD (2017 NSTEMI with PCI-DES to ostial LAD), HLD, ascending aortic aneurysm, sinus bradycardia here to follow up in the Advanced Hypertension Clinic. 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  Seen by Dr. Maxcy 04/06/23 noting labile blood pressure including hypotension recommended for sleep study and referral to Advanced Hypertension Clinic. Subsequently evaluated by Barnie Grew 04/23/23 noting exertional dyspnea and chest pain.  LHC performed 5/1 concerning for three-vessel disease with widely patent LAD stent, improved stabilization of vascular RI lesion when compared to prior, ostial PDA 55% with normal LVEF 55 to 60%.  Recommended to consider nonanginal/noncardiac etiology for dyspnea.  Established with Hypertension Clinic 05/20/23. Louis Perez was diagnosed with hypertension in his 30s. Reading with home arm cuff often 110s/60s. Prior tobacco use having quit in 1970. Alcohol use only socially. Metanephrines, catecholamines, TSH were unremarkable. Itamar home sleep study was ordered but not yet completed .  At last ADV HTN visit 07/22/23 hypotension resolved with adjusting olmesartan  to bedtime dosing.  Seen by Dr. Maxcy 10/12/23 and again encouraged to complete home sleep study.   At visit 07/22/23 hypotension resolved with adjusting Olmesartan  to evening dosing. At visit 11/2023 he was given PRN Hydralazine  for transient elevated BP. At visit 06/06/24 due to activity intolerance Carvedilol  reduced to 3.125mg  BID. Lexiscan  myoview  08/03/24 for preop clearance low risk study with no ischemia.   Presents today for follow up.  BP yesterday 97/69 associated with feeling tired. This morning his blood pressure was 148/102 in setting of forgetting evening medications last night. Reports rare instances of hypotension. Did not bring BP log to clinic today. Resumed walking regimen last week. Previously walking 4-5 miles. Just started back at 2 miles and felt well. Awaiting left reverse total shoulder arthroplasty. Does note some leg cramps at night after stopping multivitamin, plans to resume.   Previous antihypertensives: Losartan -HCTZ Hydralazine   Past Medical History:  Diagnosis Date   Arthritis    hips, lower back, knees, shoulders, hands (05/14/2016)   Asthma dx'd in 2014   Bacterial septicemia (HCC) 01/2011   both knee involved   CAD (coronary artery disease)    a. NSTEMI 02/2016 s/p DES to LAD. b. Relook cath due to angina/prior high risk anatomy 05/14/16 -> continued medical therapy given concern that a stent strut could inferere with Cx itself.   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    NSTEMI (non-ST elevated myocardial infarction) (HCC) 02/2016   thelbert 03/02/2016   Recurrent upper respiratory infection (URI)    Sinus bradycardia    Unspecified tinnitus    right    Past Surgical History:  Procedure Laterality Date   BACK SURGERY     CARDIAC CATHETERIZATION N/A 03/01/2016   Procedure: Left Heart Cath and Coronary Angiography;  Surgeon: Ozell Fell, MD;  oLAD 95%, CFX/RI/RCA/RPDA 25-30%   CARDIAC CATHETERIZATION N/A 03/01/2016   Procedure: Coronary Stent Intervention;  Surgeon: Ozell Fell, MD;  Promus Premier 4.0 x  12 mm DES to [facility]   CARDIAC  CATHETERIZATION N/A 05/14/2016   Procedure: Left Heart Cath and Coronary Angiography;  Surgeon: Debby DELENA Sor, MD;  Location: [facility] INVASIVE CV LAB;  Service: Cardiovascular;  Laterality: N/A;   CARDIAC CATHETERIZATION N/A 06/17/2016   Procedure: Intravascular Pressure  Wire/FFR Study;  Surgeon: Ozell Fell, MD;  Location: [facility] INVASIVE CV LAB;  Service: Cardiovascular;  Laterality: N/A;   CARDIOVASCULAR STRESS TEST  05/26/2012   EKG negative for ischemia, no ECG changes, no significant ischemia noted   I & D KNEE WITH POLY EXCHANGE Bilateral 01/2011   & complete synovectomy of 3 compartments of the right total knee/notes 02/04/2011 (replacement,partial knee replacement because of bacterial infection)   JOINT REPLACEMENT     KNEE ARTHROSCOPY Right ~ 2002   LEFT HEART CATH AND CORONARY ANGIOGRAPHY N/A 04/28/2023   Procedure: LEFT HEART CATH AND CORONARY ANGIOGRAPHY;  Surgeon: Anner Alm ORN, MD;  Location: [facility] INVASIVE CV LAB;  Service: Cardiovascular;  Laterality: N/A;   LUMBAR DISC SURGERY  ~ 1980; 2000   NASAL SINUS SURGERY  08/2011   SHOULDER ARTHROSCOPY W/ ROTATOR CUFF REPAIR Right 01/2015   TOTAL KNEE ARTHROPLASTY Bilateral 03/26/2004-09/15/2004    right-left   TRANSTHORACIC ECHOCARDIOGRAM  05/26/2012   EF >55%, mild concentric LVH    Current Medications: Current Meds  Medication Sig   albuterol  (PROVENTIL ) (2.5 MG/3ML) 0.083% nebulizer solution Take 3 mLs (2.5 mg total) by nebulization every 4 (four) hours as needed for wheezing or shortness of breath.   albuterol  (VENTOLIN  HFA) 108 (90 Base) MCG/ACT inhaler Inhale 2 puffs into the lungs every 4 (four) hours as needed for wheezing or shortness of breath.   aspirin  81 MG tablet Take 81 mg by mouth at bedtime.    Aspirin -Acetaminophen -Caffeine (GOODY HEADACHE PO) Take 1 packet by mouth daily as needed (pain).   budesonide -glycopyrrolate-formoterol  (BREZTRI  AEROSPHERE) 160-9-4.8 MCG/ACT AERO inhaler Inhale 2 puffs into the lungs in the morning and at bedtime.   cetirizine  (ZYRTEC ) 10 MG tablet TAKE 1 TABLET BY MOUTH ONCE DAILY AS NEEDED FOR ALLERGIES (CAN TAKE AN EXTRA DOSE DURING FLARE UPS)   clotrimazole  (MYCELEX ) 10 MG troche Take 1 tablet (10 mg total) by mouth in the morning and at bedtime. Take after  each use of Breztri    famotidine  (PEPCID ) 40 MG tablet Take 1 tablet (40 mg total) by mouth daily as needed for heartburn or indigestion (Can take an etxra dose during flare ups.).   hydrALAZINE  (APRESOLINE ) 25 MG tablet Take 1 tablet (25 mg total) by mouth as needed (as needed for systolic blood pressure more than 160).   ipratropium (ATROVENT ) 0.06 % nasal spray Place 2 sprays into both nostrils 4 (four) times daily. 1-2 sprays each nostril every 6 hours to dry nose   isosorbide  mononitrate (IMDUR ) 60 MG 24 hr tablet Take 1 tablet (60 mg total) by mouth daily.   levocetirizine (XYZAL ) 5 MG tablet TAKE 1 TABLET BY MOUTH TWICE DAILY AS NEEDED CAN TAKE AN EXTRA DOSE DURING FLARE UPS. APPOINTMENT REQUIRED FOR FUTURE REFILLS   meloxicam (MOBIC) 7.5 MG tablet Take 7.5 mg by mouth 2 (two) times daily.   Nebulizer MISC 1 Device by Does not apply route as directed.   nitroGLYCERIN  (NITROSTAT ) 0.4 MG SL tablet Place 1 tablet (0.4 mg total) under the tongue every 5 (five) minutes as needed for chest pain.   olmesartan  (BENICAR ) 20 MG tablet Take 1 tablet by mouth once daily   Spacer/Aero-Holding Chambers DEVI 1 Device by Does not apply route as directed.  Study - ORION 4 - inclisiran 300 mg/1.5mL or placebo SQ injection (PI-Stuckey) Inject 300 mg into the skin every 6 (six) months. For Investigational Use Only. Please alert research coordinator of change/addition of any lipid lowering agent or lipid panels drawn per research protocol. ORION 4 STUDY MEDICATION - inclisiran 300 mg/1.5 mL or placebo SQ injection (PI-Stuckey)   sucralfate  (CARAFATE ) 1 g tablet Take 1 tablet (1 g total) by mouth in the morning, at noon, in the evening, and at bedtime.   [DISCONTINUED] carvedilol  (COREG ) 3.125 MG tablet Take 1 tablet (3.125 mg total) by mouth 2 (two) times daily.   Current Facility-Administered Medications for the 08/17/24 encounter (Office Visit) with Apryll Hinkle S, NP  Medication   Study - ORION 4 -  inclisiran 300 mg/1.5mL or placebo SQ injection (PI-Stuckey)   tezepelumab -ekko (TEZSPIRE ) 210 MG/1. syringe 210 mg     Allergies:   Oxycodone and Ambien [zolpidem tartrate]   Social History   Socioeconomic History   Marital status: Married    Spouse name: Not on file   Number of children: 2   Years of education: 14   Highest education level: Not on file  Occupational History   Occupation: retired Architect, part-time security work  Tobacco Use   Smoking status: Former    Current packs/day: 0.00    Average packs/day: 1 pack/day for 5.0 years (5.0 ttl pk-yrs)    Types: Cigarettes    Start date: 12/15/1963    Quit date: 12/14/1968    Years since quitting: 55.7   Smokeless tobacco: Never  Vaping Use   Vaping status: Never Used  Substance and Sexual Activity   Alcohol use: Yes    Alcohol/week: 1.0 standard drink of alcohol    Types: 1 Cans of beer per week    Comment: Occasional   Drug use: No   Sexual activity: Not Currently    Partners: Female    Comment: Married  Other Topics Concern   Not on file  Social History Narrative   Patient lives at home with his wife . He is retired. Patient has 14 years of education. Patient has two children.   Caffeine - two cups daily.   Right-handed   Social Drivers of Corporate investment banker Strain: Low Risk  (05/20/2023)   Overall Financial Resource Strain (CARDIA)    Difficulty of Paying Living Expenses: Not hard at all  Food Insecurity: Low Risk  (01/19/2024)   Received from [facility]   Hunger Vital Sign    Within the past 12 months, you worried that your food would run out before you got money to buy more: Never true    Within the past 12 months, the food you bought just didn't last and you didn't have money to get more. : Never true  Transportation Needs: No Transportation Needs (01/19/2024)   Received from [facility]    In the past 12 months, has lack of reliable transportation kept you from medical  appointments, meetings, work or from getting things needed for daily living? : No  Physical Activity: Sufficiently Active (05/20/2023)   Exercise Vital Sign    Days of Exercise per Week: 7 days    Minutes of Exercise per Session: 30 min  Stress: No Stress Concern Present (05/20/2023)   [employer] of Occupational Health - Occupational Stress Questionnaire    Feeling of Stress : Not at all  Social Connections: Socially Integrated (05/20/2023)   Social Connection and Isolation  Panel    Frequency of Communication with Friends and Family: More than three times a week    Frequency of Social Gatherings with Friends and Family: More than three times a week    Attends Religious Services: More than 4 times per year    Active Member of [employer] or Organizations: Yes    Attends Engineer, structural: More than 4 times per year    Marital Status: Married     Family History: The patient's family history includes Alzheimer's disease in his father; Cancer in his paternal aunt, paternal uncle, paternal uncle, and paternal uncle; Dementia in his father; Heart disease in his mother; Heart disease (age of onset: 53) in his brother.  ROS:   Please see the history of present illness.    All other systems reviewed and are negative.  EKGs/Labs/Other Studies Reviewed:         Recent Labs: No results found for requested labs within last 365 days.   Recent Lipid Panel    Component Value Date/Time   CHOL 173 06/26/2020 0927   CHOL 136 01/08/2017 0833   TRIG 103 06/26/2020 0927   TRIG 109 01/08/2017 0833   HDL 54 06/26/2020 0927   HDL 53 01/08/2017 0833   CHOLHDL 3.2 06/26/2020 0927   CHOLHDL 2.6 01/08/2017 0833   CHOLHDL 3.0 03/01/2016 0448   VLDL 17 03/01/2016 0448   LDLCALC 100 (H) 06/26/2020 0927   LDLCALC 64 01/08/2017 0833    Physical Exam:   VS:  BP 138/84   Pulse 66   Ht 6' 2.5 (1.892 m)   Wt 204 lb 9.6 oz (92.8 kg)   SpO2 96%   BMI 25.92 kg/m  , BMI Body mass index is 25.92  kg/m. GENERAL:  Well appearing HEENT: Pupils equal round and reactive, fundi not visualized, oral mucosa unremarkable NECK:  No jugular venous distention, waveform within normal limits, carotid upstroke brisk and symmetric, no bruits, no thyromegaly LYMPHATICS:  No cervical adenopathy LUNGS:  Clear to auscultation bilaterally HEART:  RRR.  PMI not displaced or sustained,S1 and S2 within normal limits, no S3, no S4, no clicks, no rubs, no murmurs ABD:  Flat, positive bowel sounds normal in frequency in pitch, no bruits, no rebound, no guarding, no midline pulsatile mass, no hepatomegaly, no splenomegaly EXT:  2 plus pulses throughout, no edema, no cyanosis no clubbing SKIN:  No rashes no nodules. NEURO:  Cranial nerves II through XII grossly intact, motor grossly intact throughout PSYCH:  Cognitively intact, oriented to person place and time   ASSESSMENT/PLAN:    HTN -longstanding history of hypertension having been diagnosed in his 60s.  BP has been labile with episode of hypertension requiring PRN Hydralazine  last week but also prior symptomatic hypotension.  BPmildly elevated today in setting of forgetting his evening medications last night. Continue present regimen Olmesartan  20mg  QHS, Carvedilol  3.125mg  BID, Imdur  60mg  QD.   MyChart message in 2 weeks to check in on BP and ensure home readings at goal <130/80 Secondary workup: BP symmetrical, no indication for carotid duplex nor evidence of aortic coarctation.  Renal duplex 03/2022 no stenosis Metanephrines, catecholamines, TSH normal. As BP controlled, will defer renin-aldosterone  Itamar sleep study approved, encouraged to complete  Snores - Encouraged to complete home sleep study. Already has Itamar home sleep study. Discussed that need 4 hours of sleep, he was concerned his interrupted sleep related to shoulder pain would affect the study. Reassured him it would not.   Coronary artery  disease/hyperlipidemia, LDL goal less than 70-  LHC 04/28/23 patent stent and otherwise nonobstructive disease. 07/2024 myoview  low risk study. Stable with no anginal symptoms. No indication for ischemic evaluation. GDMT aspirin , carvedilol , imdur . Prior statin intolerance, participating in [place] lipid trial and as such lipid results are blinded. Heart healthy diet and regular cardiovascular exercise encouraged.    Screening for Secondary Hypertension:     05/20/2023   12:43 PM  Causes  Drugs/Herbals Screened     - Comments 05/20/23 recommend decreased caffeine intake  Renovascular HTN Screened     - Comments renal duplex 03/2023 no stenosis  Sleep Apnea Screened  Thyroid Disease Screened  Pheochromocytoma Screened     - Comments 05/2023 metanephrines, catecholamines  Cushing's Syndrome N/A     - Comments non cushingoid appearance  Coarctation of the Aorta N/A     - Comments symmetrical BP  Compliance Screened     - Comments Takes medications regularly    Relevant Labs/Studies:    Latest Ref Rng & Units 04/23/2023    3:34 PM 03/26/2022    3:07 PM 06/08/2019    3:03 PM  Basic Labs  Sodium 134 - 144 mmol/L 142  143  143   Potassium 3.5 - 5.2 mmol/L 4.3  4.5  4.3   Creatinine 0.76 - 1.27 mg/dL 9.20  9.09  9.16        Latest Ref Rng & Units 05/20/2023    8:57 AM 03/26/2022    3:07 PM  Thyroid   TSH 0.450 - 4.500 uIU/mL 1.180  1.080           Latest Ref Rng & Units 05/20/2023    8:57 AM  Metanephrines/Catecholamines   Epinephrine 0 - 62 pg/mL 18   Norepinephrine 0 - 874 pg/mL 903   Dopamine 0 - 48 pg/mL <30   Metanephrines 0.0 - 88.0 pg/mL <25.0   Normetanephrines  0.0 - 285.2 pg/mL 60.7           04/03/2022    8:41 AM  Renovascular   Renal Artery US  Completed Yes       Disposition:    FU with MD/APP/PharmD in 3-4 months    Medication Adjustments/Labs and Tests Ordered: Current medicines are reviewed at length with the patient today.  Concerns regarding medicines are outlined above.  No orders of the defined types  were placed in this encounter.  Meds ordered this encounter  Medications   carvedilol  (COREG ) 3.125 MG tablet    Sig: Take 1 tablet (3.125 mg total) by mouth 2 (two) times daily.    Dispense:  180 tablet    Refill:  1    Supervising Provider:   LONNI SLAIN [[id number]]     Signed, Reche GORMAN Finder, NP  08/17/2024 8:17 PM    [facility]

## 2024-08-17 NOTE — Patient Instructions (Addendum)
 Medication Instructions:  Recommend trial of Tylenol  1,000mg  at bedtime  If that does not help with shoulder pain, consider reaching out to Dr. Lazoff about increasing evening dose of Meloxicam  Follow-Up: Please follow up in 3-4 months in ADV HTN CLINIC with Dr. Raford, Reche Finder, NP or Allean Mink PharmD    Special Instructions:    Heart Healthy Diet Recommendations: A low-salt diet is recommended. Meats should be grilled, baked, or boiled. Avoid fried foods. Focus on lean protein sources like fish or chicken with vegetables and fruits. The American Heart Association is a Chief Technology Officer!  American Heart Association Diet and Lifeystyle Recommendations   Exercise recommendations: The American Heart Association recommends 150 minutes of moderate intensity exercise weekly. Try 30 minutes of moderate intensity exercise 4-5 times per week. This could include walking, jogging, or swimming.

## 2024-08-31 MED ORDER — OLMESARTAN MEDOXOMIL 20 MG PO TABS: 40.0000 mg | ORAL_TABLET | Freq: Every day | ORAL | Status: DC

## 2024-09-03 ENCOUNTER — Other Ambulatory Visit: Payer: Self-pay | Admitting: Allergy and Immunology

## 2024-09-12 ENCOUNTER — Ambulatory Visit (INDEPENDENT_AMBULATORY_CARE_PROVIDER_SITE_OTHER)

## 2024-09-12 ENCOUNTER — Ambulatory Visit (HOSPITAL_BASED_OUTPATIENT_CLINIC_OR_DEPARTMENT_OTHER): Payer: Self-pay | Admitting: Family

## 2024-09-12 DIAGNOSIS — J4550 Severe persistent asthma, uncomplicated: Secondary | ICD-10-CM

## 2024-09-12 LAB — BASIC METABOLIC PANEL WITH GFR
BUN/Creatinine Ratio: 23 (ref 10–24)
BUN: 16 mg/dL (ref 8–27)
CO2: 27 mmol/L (ref 20–29)
Calcium: 9.5 mg/dL (ref 8.6–10.2)
Chloride: 100 mmol/L (ref 96–106)
Creatinine, Ser: 0.71 mg/dL — ABNORMAL LOW (ref 0.76–1.27)
Glucose: 80 mg/dL (ref 70–99)
Potassium: 4.1 mmol/L (ref 3.5–5.2)
Sodium: 143 mmol/L (ref 134–144)
eGFR: 97 mL/min/1.73 (ref >59)

## 2024-10-01 ENCOUNTER — Other Ambulatory Visit: Payer: Self-pay | Admitting: Allergy and Immunology

## 2024-10-10 ENCOUNTER — Ambulatory Visit (INDEPENDENT_AMBULATORY_CARE_PROVIDER_SITE_OTHER)

## 2024-10-10 DIAGNOSIS — J4550 Severe persistent asthma, uncomplicated: Secondary | ICD-10-CM | POA: Diagnosis not present

## 2024-11-01 ENCOUNTER — Encounter (HOSPITAL_BASED_OUTPATIENT_CLINIC_OR_DEPARTMENT_OTHER): Payer: Self-pay | Admitting: Cardiovascular Disease

## 2024-11-01 ENCOUNTER — Ambulatory Visit (INDEPENDENT_AMBULATORY_CARE_PROVIDER_SITE_OTHER): Admitting: Cardiovascular Disease

## 2024-11-01 VITALS — BP 164/100 | HR 74 | Ht 75.0 in | Wt 206.7 lb

## 2024-11-01 DIAGNOSIS — Z820 Family history of epilepsy and other diseases of the nervous system: Secondary | ICD-10-CM

## 2024-11-01 DIAGNOSIS — I2510 Atherosclerotic heart disease of native coronary artery without angina pectoris: Secondary | ICD-10-CM | POA: Diagnosis not present

## 2024-11-01 DIAGNOSIS — I10 Essential (primary) hypertension: Secondary | ICD-10-CM | POA: Diagnosis not present

## 2024-11-01 DIAGNOSIS — I77811 Abdominal aortic ectasia: Secondary | ICD-10-CM | POA: Diagnosis not present

## 2024-11-01 DIAGNOSIS — E785 Hyperlipidemia, unspecified: Secondary | ICD-10-CM | POA: Diagnosis not present

## 2024-11-01 MED ORDER — AMLODIPINE BESYLATE 2.5 MG PO TABS: 2.5000 mg | ORAL_TABLET | Freq: Every day | ORAL | 3 refills | Status: DC

## 2024-11-01 NOTE — Patient Instructions (Addendum)
 Medication Instructions:  START AMLODIPINE  2.5 MG DAILY   Labwork: RENIN/ALDOSTERONE TODAY   Testing/Procedures: NONE  Follow-Up: CAITLIN W NP IN JANUARY   Any Other Special Instructions Will Be Listed Below (If Applicable). MONITOR BLOOD PRESSURE TWICE A DAY, SEND READINGS IN COUPLE OF WEEKS   You have been referred to NEUROLOGY   Where: [facility] Address: [address] [place], [address] [place] [address] Phone: [phone] IF YOU DO NOT HEAR FROM THEM IN COUPLE WEEKS YOU CAN CALL THEM DIRECTLY   If you need a refill on your cardiac medications before your next appointment, please call your pharmacy.

## 2024-11-01 NOTE — Progress Notes (Signed)
 Hypertension Clinic Follow Up:    Date:  11/01/2024   ID:  Louis Perez, DOB 1951-03-03, MRN [id number]  PCP:  Lazoff, Shawn P, DO  Cardiologist:  Vinie JAYSON Maxcy, MD   Referring MD: Macarthur Elouise SQUIBB, DO   CC: Hypertension  History of Present Illness:    Louis Perez is a 73 y.o. male with a hx of CAD status post PCI, myocardial bridge, hypertension, hyperlipidemia, ascending aortic aneurysm, and sinus bradycardia here for follow-up.  He saw Dr. Maxcy 03/2023 and noted labile blood pressures including hypotension.  He was referred for sleep study and to the advanced hypertension clinic.  He was seen in clinic 04/2023 and had exertional dyspnea and chest pain.  He underwent left heart cath that revealed nonobstructive disease in the ramus, RCA, PDA, and distal LAD.  His LAD stent was patent.  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  LVEF was 55 to 60%.  LVEDP was normal.  His symptoms were not thought to be anginal.  He reported being initially diagnosed with hypertension in his 3s.  Home blood pressure readings were in the 110s over 60s when he was seen.  Catecholamines, metanephrines, and TSH were unremarkable.  His hypotension was resolved with adjusting telmisartan.  Renal Dopplers were - 03/2022.  He last saw Reche Finder, NP 11/2023.  At that time he had to take some as needed hydralazine  for occasional SBP's in the 180s.  At his visit 05/2024 he continued to experience very labile blood pressures ranging from 90s to 190s systolic.  He noted headaches when his blood pressure is high.  He noted exertional lightheadedness and carvedilol  was reduced.  He had a nuclear stress test 07/2024 for preoperative clearance which was low risk.  At his follow-up visit with Reche Finder, NP 08/2024 and he had started back walking for exercise.  Blood pressures continue to be labile but last so than in the past.  He was encouraged to complete his sleep study.  Discussed the use of AI  scribe software for clinical note transcription with the patient, who gave verbal consent to proceed.  History of Present Illness Louis Perez has consistently high blood pressure readings at home, with most measurements being elevated except for a few normal ones. Averaging 150s systolic.  He experiences mild headaches when his blood pressure is elevated. His current medications include olmesartan  40 mg, carvedilol  3.125 mg, and hydralazine  25 mg as needed. He takes hydralazine  when his blood pressure approaches 160 mmHg, sometimes taking it twice a day if needed.  He has reduced his physical activity from walking four to five miles to two miles, five days a week, due to arthritis and joint problems. He does not feel like doing more due to these issues.  He has a family history of Alzheimer's disease, with his father and grandmother having had the condition. His wife is concerned about his risk of developing Alzheimer's and is interested in genetic testing to assess his risk.  He recently changed his primary care doctor after an unsatisfactory experience when he was unable to be seen without an appointment despite being a long-term patient. He was later diagnosed with a urinary tract infection at an urgent care facility.   Past Medical History:  Diagnosis Date   Arthritis    hips, lower back, knees, shoulders, hands (05/14/2016)   Asthma dx'd in 2014   Bacterial septicemia (HCC) 01/2011   both knee involved   CAD (coronary artery  disease)    a. NSTEMI 02/2016 s/p DES to LAD. b. Relook cath due to angina/prior high risk anatomy 05/14/16 -> continued medical therapy given concern that a stent strut could inferere with Cx itself.   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    NSTEMI (non-ST elevated myocardial infarction) (HCC) 02/2016   thelbert 03/02/2016   Recurrent upper respiratory infection (URI)     Sinus bradycardia    Unspecified tinnitus    right    Past Surgical History:  Procedure Laterality Date   BACK SURGERY     CARDIAC CATHETERIZATION N/A 03/01/2016   Procedure: Left Heart Cath and Coronary Angiography;  Surgeon: Ozell Fell, MD;  oLAD 95%, CFX/RI/RCA/RPDA 25-30%   CARDIAC CATHETERIZATION N/A 03/01/2016   Procedure: Coronary Stent Intervention;  Surgeon: Ozell Fell, MD;  Promus Premier 4.0 x  12 mm DES to oLAD   CARDIAC CATHETERIZATION N/A 05/14/2016   Procedure: Left Heart Cath and Coronary Angiography;  Surgeon: Debby DELENA Sor, MD;  Location: [facility] INVASIVE CV LAB;  Service: Cardiovascular;  Laterality: N/A;   CARDIAC CATHETERIZATION N/A 06/17/2016   Procedure: Intravascular Pressure Wire/FFR Study;  Surgeon: Ozell Fell, MD;  Location: [facility] INVASIVE CV LAB;  Service: Cardiovascular;  Laterality: N/A;   CARDIOVASCULAR STRESS TEST  05/26/2012   EKG negative for ischemia, no ECG changes, no significant ischemia noted   I & D KNEE WITH POLY EXCHANGE Bilateral 01/2011   & complete synovectomy of 3 compartments of the right total knee/notes 02/04/2011 (replacement,partial knee replacement because of bacterial infection)   JOINT REPLACEMENT     KNEE ARTHROSCOPY Right ~ 2002   LEFT HEART CATH AND CORONARY ANGIOGRAPHY N/A 04/28/2023   Procedure: LEFT HEART CATH AND CORONARY ANGIOGRAPHY;  Surgeon: Anner Alm ORN, MD;  Location: [facility] INVASIVE CV LAB;  Service: Cardiovascular;  Laterality: N/A;   LUMBAR DISC SURGERY  ~ 1980; 2000   NASAL SINUS SURGERY  08/2011   SHOULDER ARTHROSCOPY W/ ROTATOR CUFF REPAIR Right 01/2015   TOTAL KNEE ARTHROPLASTY Bilateral 03/26/2004-09/15/2004    right-left   TRANSTHORACIC ECHOCARDIOGRAM  05/26/2012   EF >55%, mild concentric LVH    Current Medications: Current Meds  Medication Sig   albuterol  (PROVENTIL ) (2.5 MG/3ML) 0.083% nebulizer solution Take 3 mLs (2.5 mg total) by nebulization every 4 (four) hours as needed for wheezing or shortness of breath.    albuterol  (VENTOLIN  HFA) 108 (90 Base) MCG/ACT inhaler Inhale 2 puffs into the lungs every 4 (four) hours as needed for wheezing or shortness of breath.   amLODipine (NORVASC) 2.5 MG tablet Take 1 tablet (2.5 mg total) by mouth daily.   aspirin  81 MG tablet Take 81 mg by mouth at bedtime.    Aspirin -Acetaminophen -Caffeine (GOODY HEADACHE PO) Take 1 packet by mouth daily as needed (pain).   budesonide -glycopyrrolate-formoterol  (BREZTRI  AEROSPHERE) 160-9-4.8 MCG/ACT AERO inhaler Inhale 2 puffs into the lungs in the morning and at bedtime.   carvedilol  (COREG ) 3.125 MG tablet Take 1 tablet (3.125 mg total) by mouth 2 (two) times daily.   cetirizine  (EQ ALLERGY  RELIEF, CETIRIZINE ,) 10 MG tablet TAKE 1 TABLET BY MOUTH ONCE DAILY AS NEEDED FOR ALLERGIES (CAN TAKE AN EXTRA DOSE DURING FLARE UPS)   clotrimazole  (MYCELEX ) 10 MG troche Take 1 tablet (10 mg total) by mouth in the morning and at bedtime. Take after each use of Breztri    famotidine  (PEPCID ) 40 MG tablet  Take 1 tablet (40 mg total) by mouth daily as needed for heartburn or indigestion (Can take an etxra dose during flare ups.).   hydrALAZINE  (APRESOLINE ) 25 MG tablet Take 1 tablet (25 mg total) by mouth as needed (as needed for systolic blood pressure more than 160).   ipratropium (ATROVENT ) 0.06 % nasal spray Place 2 sprays into both nostrils 4 (four) times daily. 1-2 sprays each nostril every 6 hours to dry nose   isosorbide  mononitrate (IMDUR ) 60 MG 24 hr tablet Take 1 tablet (60 mg total) by mouth daily.   levocetirizine (XYZAL ) 5 MG tablet TAKE 1 TABLET BY MOUTH TWICE DAILY AS NEEDED CAN TAKE AN EXTRA DOSE DURING FLARE UPS. APPOINTMENT REQUIRED FOR FUTURE REFILLS   meloxicam (MOBIC) 7.5 MG tablet Take 7.5 mg by mouth 2 (two) times daily.   Nebulizer MISC 1 Device by Does not apply route as directed.   nitroGLYCERIN  (NITROSTAT ) 0.4 MG SL tablet Place 1 tablet (0.4 mg total) under the tongue every 5 (five) minutes as needed for chest pain.    olmesartan  (BENICAR ) 20 MG tablet Take 2 tablets (40 mg total) by mouth daily.   Spacer/Aero-Holding Chambers DEVI 1 Device by Does not apply route as directed.   Study - ORION 4 - inclisiran 300 mg/1.5mL or placebo SQ injection (PI-Stuckey) Inject 300 mg into the skin every 6 (six) months. For Investigational Use Only. Please alert research coordinator of change/addition of any lipid lowering agent or lipid panels drawn per research protocol. ORION 4 STUDY MEDICATION - inclisiran 300 mg/1.5 mL or placebo SQ injection (PI-Stuckey)   sucralfate  (CARAFATE ) 1 g tablet TAKE 1 TABLET BY MOUTH IN THE MORNING, AT NOON, IN THE EVENING AND AT BEDTIME   Current Facility-Administered Medications for the 11/01/24 encounter (Office Visit) with Raford Riggs, MD  Medication   Study - ORION 4 - inclisiran 300 mg/1.48mL or placebo SQ injection (PI-Stuckey)   tezepelumab -ekko (TEZSPIRE ) 210 MG/1. syringe 210 mg     Allergies:   Oxycodone and Ambien [zolpidem tartrate]   Social History   Socioeconomic History   Marital status: Married    Spouse name: Not on file   Number of children: 2   Years of education: 14   Highest education level: Not on file  Occupational History   Occupation: retired Architect, part-time security work  Tobacco Use   Smoking status: Former    Current packs/day: 0.00    Average packs/day: 1 pack/day for 5.0 years (5.0 ttl pk-yrs)    Types: Cigarettes    Start date: 12/15/1963    Quit date: 12/14/1968    Years since quitting: 55.9   Smokeless tobacco: Never  Vaping Use   Vaping status: Never Used  Substance and Sexual Activity   Alcohol use: Yes    Alcohol/week: 1.0 standard drink of alcohol    Types: 1 Cans of beer per week    Comment: Occasional   Drug use: No   Sexual activity: Not Currently    Partners: Female    Comment: Married  Other Topics Concern   Not on file  Social History Narrative   Patient lives at home with his wife . He is retired. Patient has  14 years of education. Patient has two children.   Caffeine - two cups daily.   Right-handed   Social Drivers of Corporate Investment Banker Strain: Low Risk  (05/20/2023)   Overall Financial Resource Strain (CARDIA)    Difficulty of Paying Living Expenses: Not hard  at all  Food Insecurity: Low Risk  (01/19/2024)   Received from [facility]   Hunger Vital Sign    Within the past 12 months, you worried that your food would run out before you got money to buy more: Never true    Within the past 12 months, the food you bought just didn't last and you didn't have money to get more. : Never true  Transportation Needs: No Transportation Needs (01/19/2024)   Received from [facility]    In the past 12 months, has lack of reliable transportation kept you from medical appointments, meetings, work or from getting things needed for daily living? : No  Physical Activity: Sufficiently Active (05/20/2023)   Exercise Vital Sign    Days of Exercise per Week: 7 days    Minutes of Exercise per Session: 30 min  Stress: No Stress Concern Present (05/20/2023)   [employer] of Occupational Health - Occupational Stress Questionnaire    Feeling of Stress : Not at all  Social Connections: Socially Integrated (05/20/2023)   Social Connection and Isolation Panel    Frequency of Communication with Friends and Family: More than three times a week    Frequency of Social Gatherings with Friends and Family: More than three times a week    Attends Religious Services: More than 4 times per year    Active Member of [employer] or Organizations: Yes    Attends Engineer, Structural: More than 4 times per year    Marital Status: Married     Family History: The patient's family history includes Alzheimer's disease in his father; Cancer in his paternal aunt, paternal uncle, paternal uncle, and paternal uncle; Dementia in his father; Heart disease in his mother; Heart disease (age of onset: 46) in his  brother.  ROS:   Please see the history of present illness.    All other systems reviewed and are negative.  EKGs/Labs/Other Studies Reviewed:    EKG:  EKG is not ordered today.     EKG Interpretation Date/Time:  Wednesday November 01 2024 10:00:43 EST Ventricular Rate:  74 PR Interval:  162 QRS Duration:  88 QT Interval:  384 QTC Calculation: 426 R Axis:   -55  Text Interpretation: Normal sinus rhythm Left anterior fascicular block No significant change since last tracing Confirmed by Raford Riggs ([id number]) on 11/01/2024 10:10:47 AM       LHC 04/28/23:   Previously placed Ost LAD to Prox LAD drug-eluting stent is  widely patent.   Ramus lesion is 55% stenosed.   Dist LAD lesion is 40% stenosed.  Mild myocardial bridging   Prox RCA to Mid RCA lesion is 25% stenosed.   RPDA lesion is 55% stenosed.   --------------------------------------   The left ventricular systolic function is normal.  The left ventricular ejection fraction is 55-65% by visual estimate.   LV end diastolic pressure is normal.   3-vessel disease with widely patent LAD stent, improved/stabilization of ostial RI lesion when compared to prior catheterization, and ostial PDA 55%. Normal LVEF 55 to 60% with normal EDP.     RECOMMENDATIONS: Would consider nonanginal/noncardiac etiology for dyspnea.  Lexiscan  Myoview  08/03/24:   The study is normal. The study is low risk.   No ST deviation was noted.   LV perfusion is abnormal. Defect 1: There is a medium defect with mild reduction in uptake present in the mid inferolateral location(s) that is fixed. There is normal wall motion in the defect area.  Consistent with artifact.   Left ventricular function is normal. Nuclear stress EF: 63%. The left ventricular ejection fraction is normal (55-65%). End diastolic cavity size is normal. End systolic cavity size is normal.   CT images were obtained for attenuation correction and were examined for the presence of  coronary calcium  when appropriate.   Coronary calcium  was present on the attenuation correction CT images. Mild coronary calcifications were present. Coronary calcifications were present in the left anterior descending artery and left circumflex artery distribution(s).   Prior study not available for comparison.    Recent Labs: 09/11/2024: BUN 16; Creatinine, Ser 0.71; Potassium 4.1; Sodium 143   Recent Lipid Panel    Component Value Date/Time   CHOL 173 06/26/2020 0927   CHOL 136 01/08/2017 0833   TRIG 103 06/26/2020 0927   TRIG 109 01/08/2017 0833   HDL 54 06/26/2020 0927   HDL 53 01/08/2017 0833   CHOLHDL 3.2 06/26/2020 0927   CHOLHDL 2.6 01/08/2017 0833   CHOLHDL 3.0 03/01/2016 0448   VLDL 17 03/01/2016 0448   LDLCALC 100 (H) 06/26/2020 0927   LDLCALC 64 01/08/2017 0833    Physical Exam:    VS:  BP (!) 164/100   Pulse 74   Ht 6' 3 (1.905 m)   Wt 206 lb 11.2 oz (93.8 kg)   SpO2 94%   BMI 25.84 kg/m  , BMI Body mass index is 25.84 kg/m. GENERAL:  Well appearing HEENT: Pupils equal round and reactive, fundi not visualized, oral mucosa unremarkable NECK:  No jugular venous distention, waveform within normal limits, carotid upstroke brisk and symmetric, no bruits, no thyromegaly LUNGS:  Clear to auscultation bilaterally HEART:  RRR.  PMI not displaced or sustained,S1 and S2 within normal limits, no S3, no S4, no clicks, no rubs, no murmurs ABD:  Flat, positive bowel sounds normal in frequency in pitch, no bruits, no rebound, no guarding, no midline pulsatile mass, no hepatomegaly, no splenomegaly EXT:  2 plus pulses throughout, no edema, no cyanosis no clubbing SKIN:  No rashes no nodules NEURO:  Cranial nerves II through XII grossly intact, motor grossly intact throughout PSYCH:  Cognitively intact, oriented to person place and time   ASSESSMENT/PLAN:    Assessment & Plan # Primary hypertension Blood pressure averages in the 150s. Current regimen includes  olmesartan , carvedilol , and hydralazine . Hydralazine  used almost daily. Amlodipine considered to achieve target 140/80 mmHg. Discussed risks of hypertension and stroke symptoms.  Challenging as he also has episodes of symptomatic hypotension.  - Prescribed amlodipine 2.5 mg daily. - Continue olmesartan  40 mg, carvedilol  3.125 mg, and hydralazine  25 mg as needed. - Ordered renin and aldosterone levels. - Instructed to send MyChart message in two weeks with blood pressure readings. - Scheduled follow-up in a couple of months.  # Atherosclerotic heart disease of native coronary artery: # Myocardial bridge: # Hyperlipidemia:  No angina symptoms. Previous stress test reviewed. Focus on blood pressure control to reduce cardiovascular risk. - Continue current management and monitor for new symptoms.   Disposition:   f/u in 2 months    Medication Adjustments/Labs and Tests Ordered: Current medicines are reviewed at length with the patient today.  Concerns regarding medicines are outlined above.  Orders Placed This Encounter  Procedures   Aldosterone + renin activity w/ ratio   Ambulatory referral to Neurology   EKG 12-Lead   Meds ordered this encounter  Medications   amLODipine (NORVASC) 2.5 MG tablet    Sig: Take 1 tablet (2.5  mg total) by mouth daily.    Dispense:  90 tablet    Refill:  3     Signed, Annabella Scarce, MD  11/01/2024 10:59 AM    [facility]

## 2024-11-02 ENCOUNTER — Telehealth (HOSPITAL_BASED_OUTPATIENT_CLINIC_OR_DEPARTMENT_OTHER): Payer: Self-pay | Admitting: *Deleted

## 2024-11-02 NOTE — Telephone Encounter (Signed)
 Patient was seen by Dr Raford yesterday who requested neurology consult for family history of alzheimer's. Referral placed to [facility] neurology, below message received    Hello   We don't treat family hx of here and we don't do genetic testing here. We only see pt's that are having actual symptoms.   Thanks  Danna   Referral placed for [facility]

## 2024-11-02 NOTE — Addendum Note (Signed)
 Addended by: FREDIRICK BEAU B on: 11/02/2024 08:50 AM   Modules accepted: Orders

## 2024-11-03 NOTE — Progress Notes (Signed)
 [facility] urgent CARE eNCOUnter  Educational Psychologist PA-C -------------------------------------------------------------------------------------------------------------------  FINAL IMPRESSIONS    Provider Remarks:  Assessment/Plan: Diagnoses and all orders for this visit:  Throat pain -     POCT Rapid Strep A Screen - RN Obtain  Pharyngitis, unspecified etiology -     amoxicillin -clavulanate (AUGMENTIN ) 875-125 mg per tablet; Take 1 tablet by mouth two (2) times a day for 10 days.  Other orders -     amlodipine  (NORVASC ) 2.5 MG tablet; Take 1 tablet (2.5 mg total) by mouth daily. TAKE 1 TABLET (2.5 MG TOTAL) BY MOUTH DAILY. -     carvedilol  (COREG ) 3.125 MG tablet; Take 1 tablet (3.125 mg total) by mouth two (2) times a day. -     famotidine  (PEPCID ) 10 MG tablet; Take 1 tablet (10 mg total) by mouth two (2) times a day. 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  On physical exam right tympanic membrane obscured by cerumen, left tympanic membrane slightly erythematous, pharynx erythematous no tonsillar swelling, CTAB, RRR, positive bowel sounds.  PCR strep test was negative.  Patient was prescribed Augmentin , may take Tylenol  for pain, to eat soft foods nonacidic foods, follow-up with his doctor in 3 to 5 days if symptoms or not improving.  Patient verbalized understanding has no questions at discharge.   Urgent Care Labs Ordered:  Results for orders placed or performed in visit on 11/03/24  POCT Rapid Strep A  Result Value Ref Range   Rapid Strep A Screen Negative Negative    _____________________________________________________________________  CHIEF COMPLAINT   Chief Complaint  Patient presents with  . Sore Throat    Having shoulder surgery on 11/15/2024. Has a list of medications he cannot have a week prior to his surgery.     Patient information was obtained from: patient. History/Exam limitations: none. Patient arrived by private vehicle and walked into facility.  HISTORY OF PRESENT ILLNESS:  Louis Perez is a 73 y.o. male who presents today for evaluation of: Sore throat started on Tuesday, increasingly worse.  Not sure if he has had a fever if so he states it has been low-grade.  Painful to swallow.   REVIEW OF SYSTEMS Review of  systems negative unless noted in HPI.  Records Review:  1.  Old medical records, current nursing notes, and previous provider notes have been read, reviewed and appreciated if available and when applicable to today's visit. 2.  Chart review included allergies, current medications, past family history, past medical history, past social history, past surgical history and problem list.  PAST MEDICAL HISTORY   Past Medical History[1]  SURGICAL HISTORY   Past Surgical History[2]  IMMUNIZATION HISTORY Immunization History  Administered Date(s) Administered  . COVID-19 VACC,MRNA,(PFIZER)(PF) 02/14/2020, 03/06/2020, 11/10/2020  . Hepatitis B Vaccine, Unspecified Formulation 11/16/1991, 12/18/1991, 05/20/1992  . Hepatitis B vaccine, pediatric/adolescent dosage, 11/16/1991, 12/18/1991, 05/20/1992  . INFLUENZA IIV3 HIGH DOSE 27YRS+(FLUZONE) 11/11/2016, 10/20/2017, 10/04/2018, 09/12/2019, 10/12/2023  . INFLUENZA QUAD HIGH DOSE 27YRS+(FLUZONE) 11/11/2016, 10/20/2017, 10/04/2018, 09/12/2019  . INFLUENZA TIV (TRI) 67MO+ W/ PRESERV (IM) 09/25/2020  . Influenza Virus Vaccine, unspecified formulation 09/18/2015, 10/20/2017, 10/04/2018, 09/12/2019, 09/25/2020  . Influenza Whole 09/13/2012  . Novel Influenza-H1N1-09  11/22/2008  . Novel Influenza-h1n1-09, All Formulations 11/22/2008  . PNEUMOCOCCAL POLYSACCHARIDE 23-VALENT 10/05/2011, 10/04/2018  . Pneumococcal Conjugate 13-Valent 11/11/2016  . RSV VACCINE,ADJUVANTED(PF)(60YRS+)(AREXVY) 12/29/2022  . SHINGRIX-ZOSTER VACCINE (HZV),RECOMBINANT,ADJUVANTED(IM) 01/19/2020, 06/27/2020  . TdaP 09/26/2009, 10/12/2023  . ZOSTAVAX - ZOSTER VACCINE, LIVE, SQ 10/05/2011     CURRENT MEDICATIONS   Current Medications[3]  ALLERGIES   Allergies[4]  FAMILY HISTORY   Family History[5]     Tobacco Use: Low Risk (11/03/2024)   Patient History   . Smoking Tobacco Use: Never   . Smokeless Tobacco Use: Never   . Passive Exposure: Never  Recent Concern: Tobacco Use - Medium Risk (11/01/2024)   Received from [facility]   Patient History   . Smoking Tobacco Use: Former   . Smokeless Tobacco Use: Never   . Passive Exposure: Not on file      PHYSICAL EXAM   Vital Signs:  BP 162/103 (BP Site: R Arm, BP Position: Sitting)   Pulse 80   Temp 37 C (98.6 F) (Temporal)   Resp 18   Ht 190.5 cm (6' 3)   Wt 90.7 kg (200 lb)   SpO2 99%   BMI 25.00 kg/m   Body mass index is 25 kg/m. No LMP for male patient.  Physical Examination:  General appearance: alert and oriented, no emotional distress, calm and cooperative, and appears stated age.  Hoarse voice. Head: normocephalic and without obvious abnormality. Eyes: lids and lashes normal, conjunctivae and sclerae normal, corneas clear and pupils equal, round, reactive to light and accomodation. Ears: Right tympanic membrane tympanic membrane obscured by cerumen, left tympanic membrane slightly mildly erythematous Nose: nares normal and no discharge. Throat: throat is non erythematous, no tonsillar edema or exudate noted. Neck: supple, symmetrical, and trachea midline. Lungs: generally clear to auscultation bilaterally with unlabored breathing, no use of accessory muscles for respiration, no tachypnea or  dyspnea. Heart: regular rate and rhythm; S1, S2 normal. Abdomen: soft, non-tender, non-distended, and bowel sounds normoactive. Extremities: extremities normal, atraumatic, and no cyanosis observed. Neurologic: mental status appropriate for current medical condition; speech normal and unpressured. Mental Status: alert and oriented with appropriate appearance, behavior, and attitude. Skin: normothermic, no diaphoresis, obvious rash, pallor, cyanosis, or flushing.    URGENT CARE MEDICAL DECISION MAKING & CLINIC COURSE  Urgent Care Imaging: No image results found.    Urgent Care Labs Ordered:  Results for orders placed or performed in visit on 11/03/24  POCT Rapid Strep A  Result Value Ref Range   Rapid Strep A Screen Negative Negative    Urgent Care Clinic Treatments: No treatments in office clinically appropriate and/or necessary at this time.  Requested Consultations: None   Urgent Care Clinic Plan: As outlined above in the clinical record.  DISPOSITION  Follow-up with PCP   Disposition Upon Discharge:  1.  Routine symptom specific, illness specific and/or disease specific instructions were discussed with the patient and/or caregiver at length.  2.  The differential diagnosis for the patient's specific symptoms were reviewed and discussed with the patient/caregiver at length; the patient/caregiver expressed understanding of all discussed and their relevant questions were all satisfactorily answered.  3.  Return to care should the presenting symptoms recur, persist, or worsen in any way; or in the alternative, if new symptoms or complaints develop.  4.  The patient and any family present were given verbal and/or written discharge instructions as clinically indicated and appropriate.  5.  Continue OTC medications as needed and tolerated for control of the currently  reported symptoms, if no conflict with the currently prescribed  medications  -------------------------------------------------------------------------------------------------------------------------------------------- Note - This record has been created using [employer]. Chart creation errors have been sought but may not always have been located. Such creation errors do not reflect on the standard of medical care.       [1] Past Medical History: Diagnosis Date  . Asthma (HHS-HCC)   . Hyperlipidemia   . Hypertension   . Myocardial infarction (CMS-HCC)   [2] Past Surgical History: Procedure Laterality Date  . CARDIAC VALVE REPLACEMENT    . JOINT REPLACEMENT    [3]  Current Outpatient Medications:  .  amlodipine  (NORVASC ) 2.5 MG tablet, Take 1 tablet (2.5 mg total) by mouth daily. TAKE 1 TABLET (2.5 MG TOTAL) BY MOUTH DAILY., Disp: , Rfl:  .  carvedilol  (COREG ) 3.125 MG tablet, Take 1 tablet (3.125 mg total) by mouth two (2) times a day., Disp: , Rfl:  .  acetaminophen  500 mg capsule, 2 capsules as needed Orally every 6 hrs, Disp: , Rfl:  .  albuterol  (PROVENTIL  HFA;VENTOLIN  HFA) 90 mcg/actuation inhaler, 1 puff as needed Inhalation every 4 hrs, Disp: , Rfl:  .  amoxicillin -clavulanate (AUGMENTIN ) 875-125 mg per tablet, Take 1 tablet by mouth two (2) times a day for 10 days., Disp: 20 tablet, Rfl: 0 .  aspirin  (ECOTRIN) 81 MG tablet, Take 1 tablet (81 mg total) by mouth daily., Disp: , Rfl:  .  budesonide -formoterol  (SYMBICORT ) 160-4.5 mcg/actuation inhaler, INHALE 2 PUFFS BY MOUTH TWICE DAILY. RINSE, GARGLE AND SPIT OUT AFTER USE Inhalation; Duration: 30, Disp: , Rfl:  .  budesonide -glycopyr-formoterol  (BREZTRI  AEROSPHERE) 160-9-4.8 mcg/actuation inhaler, Inhale 2 puffs., Disp: , Rfl:  .  carvedilol  (COREG ) 6.25 MG tablet, Take 1 tablet (6.25 mg total) by mouth., Disp: , Rfl:  .  cetirizine  (ZYRTEC ) 10 MG tablet, Take 1 tablet (10 mg total) by mouth daily., Disp: , Rfl:  .  clotrimazole  (MYCELEX ) 10 mg troche, Take 1 tablet (10 mg total) by  mouth daily as needed., Disp: , Rfl:  .  diphenhydrAMINE-acetaminophen  (TYLENOL  PM) 25-500 mg Tab, Take 1 tablet by mouth nightly as needed., Disp: , Rfl:  .  famotidine  (PEPCID ) 10 MG tablet, Take 1 tablet (10 mg total) by mouth two (2) times a day., Disp: , Rfl:  .  famotidine  (PEPCID ) 40 MG tablet, Take 1 tablet (40 mg total) by mouth daily., Disp: , Rfl:  .  fluticasone  propionate (FLONASE ) 50 mcg/actuation nasal spray, 1 spray., Disp: , Rfl:  .  hydrALAZINE  (APRESOLINE ) 25 MG tablet, , Disp: , Rfl:  .  isosorbide  mononitrate (IMDUR ) 60 MG 24 hr tablet, Take 1 tablet (60 mg total) by mouth daily., Disp: , Rfl:  .  levocetirizine (XYZAL ) 5 MG tablet, TAKE 1 TABLET BY MOUTH TWICE DAILY AS NEEDED (CAN TAKE AN EXTRA DOSE DURING FLARE UPS), Disp: , Rfl:  .  meloxicam (MOBIC) 7.5 MG tablet, Take 1 tablet (7.5 mg total) by mouth., Disp: , Rfl:  .  multivitamin capsule, Take 1 capsule by mouth daily., Disp: , Rfl:  .  naproxen (NAPROSYN) 250 MG tablet, Take 1 tablet (250 mg total) by mouth., Disp: , Rfl:  .  nitroglycerin  (NITROSTAT ) 0.4 MG SL tablet, Place 1 tablet (0.4 mg total) under the tongue every five (5) minutes as needed., Disp: , Rfl:  .  olmesartan  (BENICAR ) 20 MG tablet, Take 2 tablets (40 mg total) by mouth daily. TAKE 2 TABLETS (40 MG TOTAL) BY MOUTH DAILY., Disp: , Rfl:  .  sucralfate  (CARAFATE ) 1 gram tablet, Take 1 tablet (1 g total) by mouth., Disp: , Rfl:  [4] Allergies Allergen Reactions  . Oxycodone Anaphylaxis and Swelling  . Oxycodone-Acetaminophen  Anaphylaxis    Tolerates Vicodin  acetaminophen  / oxycodone  . Atorvastatin  Muscle Pain  . Celecoxib Other (See Comments)  . Zolpidem Other (See Comments)    Mood changes  Other Reaction(s): other  zolpidem  [5] History reviewed. No pertinent family history.

## 2024-11-07 ENCOUNTER — Ambulatory Visit

## 2024-11-07 ENCOUNTER — Encounter (HOSPITAL_BASED_OUTPATIENT_CLINIC_OR_DEPARTMENT_OTHER): Payer: Self-pay | Admitting: *Deleted

## 2024-11-07 ENCOUNTER — Telehealth (HOSPITAL_BASED_OUTPATIENT_CLINIC_OR_DEPARTMENT_OTHER): Payer: Self-pay | Admitting: *Deleted

## 2024-11-07 DIAGNOSIS — J4550 Severe persistent asthma, uncomplicated: Secondary | ICD-10-CM

## 2024-11-07 LAB — ALDOSTERONE + RENIN ACTIVITY W/ RATIO
Aldos/Renin Ratio: 7.5 (ref 0.0–30.0)
Aldosterone: 2.9 ng/dL (ref 0.0–30.0)
Renin Activity, Plasma: 0.388 ng/mL/h (ref 0.167–5.380)

## 2024-11-07 NOTE — Telephone Encounter (Signed)
 Burch, Lorane LOISE Birk, Newell NOVAK, LPN Hey Newell,  This is Levon with [facility]. We're also the same as [facility] in that we only see patients that are having symptoms. With his family history, Mr. Deal may want to consider participating in the Healthy Brain Study through [facility]. More information can be found online and the phone number is [phone] if he'd like to reach out.  I hope this helps!   From: Vergia Lanning ORN Sent: 11/01/2024  11:11 AM EST To: Newell NOVAK Birk, LPN   Hello   We don't treat family hx of here and we don't do genetic testing here. We only see pt's that are having actual symptoms.   Thanks Danna  Above received from neurology offices  Mychart message sent to patient and will forward to Dr Raford as FYI

## 2024-11-11 ENCOUNTER — Ambulatory Visit: Payer: Self-pay | Admitting: Cardiovascular Disease

## 2024-11-13 ENCOUNTER — Encounter: Admitting: *Deleted

## 2024-11-13 VITALS — BP 146/97 | HR 70 | Temp 97.9°F | Resp 16 | Wt 203.7 lb

## 2024-11-13 DIAGNOSIS — Z006 Encounter for examination for normal comparison and control in clinical research program: Secondary | ICD-10-CM

## 2024-11-13 MED ORDER — STUDY - ORION 4 - INCLISIRAN 300 MG/1.5 ML OR PLACEBO SQ INJECTION (PI-STUCKEY)
300.0000 mg | INJECTION | SUBCUTANEOUS | Status: AC
  Administered 2024-11-13: 300 mg via SUBCUTANEOUS
  Filled 2024-11-13: qty 1.5

## 2024-11-13 NOTE — Research (Signed)
 Orion 4   Patient doing well, no ae or sae to report.  Patient is having shoulder surgery 11/15/2024 No chest pains or shortness of breath.  He has started a new med for BP amlodipine   No other med changes  IP given  Will see patient back next June for probably end of study visit.   Suzen Hardy :) RN BSN  Clinical Research Nurse  Be strong and take heart, all you who hope in the [place]. ~ Psalm 31:24

## 2024-11-17 ENCOUNTER — Ambulatory Visit: Admitting: Physical Therapy

## 2024-11-17 ENCOUNTER — Other Ambulatory Visit: Payer: Self-pay

## 2024-11-17 DIAGNOSIS — M25612 Stiffness of left shoulder, not elsewhere classified: Secondary | ICD-10-CM | POA: Diagnosis present

## 2024-11-17 DIAGNOSIS — M5459 Other low back pain: Secondary | ICD-10-CM | POA: Diagnosis present

## 2024-11-17 DIAGNOSIS — M25512 Pain in left shoulder: Secondary | ICD-10-CM | POA: Diagnosis present

## 2024-11-17 DIAGNOSIS — G8929 Other chronic pain: Secondary | ICD-10-CM | POA: Diagnosis present

## 2024-11-17 NOTE — Therapy (Signed)
 OUTPATIENT PHYSICAL THERAPY UPPER EXTREMITY EVALUATION   Patient Name: Louis Perez MRN: [id number] DOB:1951-02-17, 73 y.o., male Today's Date: 11/17/2024  END OF SESSION:  PT End of Session - 11/17/24 1016     Visit Number 1    Number of Visits 12    Date for Recertification  12/29/24    PT Start Time 0854    PT Stop Time 0938    PT Time Calculation (min) 44 min    Activity Tolerance Patient tolerated treatment well    Behavior During Therapy [facility] for tasks assessed/performed          Past Medical History:  Diagnosis Date   Arthritis    hips, lower back, knees, shoulders, hands (05/14/2016)   Asthma dx'd in 2014   Bacterial septicemia (HCC) 01/2011   both knee involved   CAD (coronary artery disease)    a. NSTEMI 02/2016 s/p DES to LAD. b. Relook cath due to angina/prior high risk anatomy 05/14/16 -> continued medical therapy given concern that a stent strut could inferere with Cx itself.   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    NSTEMI (non-ST elevated myocardial infarction) (HCC) 02/2016   thelbert 03/02/2016   Recurrent upper respiratory infection (URI)    Sinus bradycardia    Unspecified tinnitus    right   Past Surgical History:  Procedure Laterality Date   BACK SURGERY     CARDIAC CATHETERIZATION N/A 03/01/2016   Procedure: Left Heart Cath and Coronary Angiography;  Surgeon: Ozell Fell, MD;  oLAD 95%, CFX/RI/RCA/RPDA 25-30%   CARDIAC CATHETERIZATION N/A 03/01/2016   Procedure: Coronary Stent Intervention;  Surgeon: Ozell Fell, MD;  Promus Premier 4.0 x  12 mm DES to oLAD   CARDIAC CATHETERIZATION N/A 05/14/2016   Procedure: Left Heart Cath and Coronary Angiography;  Surgeon: Debby DELENA Sor, MD;  Location: [facility] INVASIVE CV LAB;  Service: Cardiovascular;  Laterality: N/A;   CARDIAC CATHETERIZATION N/A 06/17/2016   Procedure: Intravascular Pressure Wire/FFR Study;   Surgeon: Ozell Fell, MD;  Location: [facility] INVASIVE CV LAB;  Service: Cardiovascular;  Laterality: N/A;   CARDIOVASCULAR STRESS TEST  05/26/2012   EKG negative for ischemia, no ECG changes, no significant ischemia noted   I & D KNEE WITH POLY EXCHANGE Bilateral 01/2011   & complete synovectomy of 3 compartments of the right total knee/notes 02/04/2011 (replacement,partial knee replacement because of bacterial infection)   JOINT REPLACEMENT     KNEE ARTHROSCOPY Right ~ 2002   LEFT HEART CATH AND CORONARY ANGIOGRAPHY N/A 04/28/2023   Procedure: LEFT HEART CATH AND CORONARY ANGIOGRAPHY;  Surgeon: Anner Alm ORN, MD;  Location: [facility] INVASIVE CV LAB;  Service: Cardiovascular;  Laterality: N/A;   LUMBAR DISC SURGERY  ~ 1980; 2000   NASAL SINUS SURGERY  08/2011   SHOULDER ARTHROSCOPY W/ ROTATOR CUFF REPAIR Right 01/2015   TOTAL KNEE ARTHROPLASTY Bilateral 03/26/2004-09/15/2004    right-left   TRANSTHORACIC ECHOCARDIOGRAM  05/26/2012   EF >55%, mild concentric LVH   Patient Active Problem List   Diagnosis Date Noted   Constipation 06/29/2022   Other specified abnormal findings of blood chemistry 06/29/2022   Severe persistent asthma with acute exacerbation (HCC) 11/25/2020   Shortness of breath 11/25/2020   Viral upper respiratory infection 11/25/2020   Asthma, severe persistent, well-controlled (HCC) 11/09/2018   Other allergic rhinitis 11/09/2018   LPRD (laryngopharyngeal reflux disease) 11/09/2018  Stomatitis and mucositis 11/09/2018   Tinnitus of both ears 09/05/2018   Visual disturbance 09/01/2018   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   Sensorineural hearing loss (SNHL) of right ear 07/11/2018   Pansinusitis 01/06/2017   Hyperlipidemia LDL goal <70 07/23/2016   Anginal chest pain at rest 06/17/2016   Stented coronary artery 06/15/2016   Pain in the chest 05/28/2016   Sinus bradycardia 05/15/2016   Unstable angina (HCC) 05/14/2016   CAD (coronary artery disease), native coronary artery  05/14/2016   Adjustment insomnia 02/28/2016   Gilbert's syndrome 02/28/2016   Hematospermia 02/28/2016   Relative erythrocytosis 02/28/2016   Dyslipidemia 09/28/2013   GERD (gastroesophageal reflux disease) 09/28/2013   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    THERAPY DIAG:  Chronic left shoulder pain - Plan: PT plan of care cert/re-cert  Stiffness of left shoulder, not elsewhere classified - Plan: PT plan of care cert/re-cert  Rationale for Evaluation and Treatment: Rehabilitation  ONSET DATE: Surgery date (11/15/24).    SUBJECTIVE:   SUBJECTIVE STATEMENT: The patient presents to the clinic s/p left reverse total shoulder replacement on 11/15/24.  His Aquacel is intact.  He reports no significant pain just soreness. He does have pain with movement out of sling.   He is wearing his sling with abduction pillow.    PERTINENT HISTORY: See above.   PAIN:  Are you having pain? No  PRECAUTIONS: Other: Progress per protocol.  No ultrasound.    RED FLAGS: None   WEIGHT BEARING RESTRICTIONS: No left UE weight bearing.    FALLS:  Has patient fallen in last 6 months? Couple times.  LIVING ENVIRONMENT: Lives with: lives with their spouse Lives in: House/apartment Has following equipment at home: None  OCCUPATION: Retired.    PLOF: Independent  PATIENT GOALS: Use left UE without pain.    OBJECTIVE:   PATIENT SURVEYS:  QUICKDASH:  46 points (79.55%).     PALPATION: Ecchymosis noted.  Aquacel intact.    UPPER EXTREMITY ROM:  In supine:  Gentle PROM into left shoulder flexion to 90 degrees with increased pain and ER to 15 degrees.                                                                                                                               TREATMENT DATE:  11/17/24:  In supine:  Gentle P/AROM x 8 minutes to patient's left shoulder f/b vasopneumatic on low with pillow between thorax and elbow x 15 minutes.      PATIENT EDUCATION:  Education details: See below.   Person educated: Patient Education method: Explanation, Demonstration, Tactile cues, Verbal cues, and Handouts Education comprehension: verbalized understanding, returned demonstration, verbal cues required, and tactile cues required  HOME EXERCISE PROGRAM: CANE CANE  [[id number]]  Seated Wand UE External Rotation -  Repeat 10 Repetitions, Hold 10 Seconds, Complete 2 Sets, Perform 3 Times a Day  Closed chain flexion stretch -  Repeat 10 Repetitions, Hold 10 Seconds, Complete 2 Sets, Perform 4 Times a Day  WAND EXTERNAL ROTATION - SUPINE ER -  Repeat 10 Repetitions, Hold 10 Seconds, Complete 2 Sets, Perform 4 Times a Day  ASSESSMENT:  CLINICAL IMPRESSION: The patient presents to OPPT s/p left reverse total shoulder replacement performed on 11/15/24.  He is doing very well thus far and his range of motion was easily performed within protocol guidelines.  His Aquacel is intact and his is wearing his sling with abduction pillow.  His QUICKDASH score is 46 points (79.55%).  Patient will benefit from skilled physical therapy intervention to address pain and deficits.  OBJECTIVE IMPAIRMENTS: decreased activity tolerance, decreased ROM, and pain.   ACTIVITY LIMITATIONS: carrying, lifting, dressing, and reach over head  PARTICIPATION LIMITATIONS: meal prep, cleaning, laundry, and yard work  PERSONAL FACTORS: Time since onset of injury/illness/exacerbation are also affecting patient's functional outcome.   REHAB POTENTIAL: Excellent  CLINICAL DECISION MAKING: Stable/uncomplicated  EVALUATION COMPLEXITY: Low   GOALS:   LONG TERM GOALS: Target date: 02/09/25  Ind with a HEP.  Goal status: INITIAL  2.  Active left shoulder flexion to 145 degrees so the patient can easily reach  overhead.  Goal status: INITIAL  3.  Active ER to 60 degrees+ to allow for easily donning/doffing of apparel.  Goal status: INITIAL  4.  Perform ADL's with pain not > 3/10.  Goal status: INITIAL  5.  Improve QUICKDASH score by at least 15 points.  Goal status: INITIAL  PLAN:  PT FREQUENCY: 2x/week  PT DURATION: 6 weeks  PLANNED INTERVENTIONS: 97110-Therapeutic exercises, 97530- Therapeutic activity, [id number]- Neuromuscular re-education, 97535- Self Care, [id number]- Manual therapy, [id number]- Electrical stimulation (unattended), 97016- Vasopneumatic device, Patient/Family education, and Cryotherapy  PLAN FOR NEXT SESSION: Progress per protocol.  Vasopneumatic with pillow between thorax and elbow.     Mariaha Ellington, PT 11/17/2024, 1:52 PM

## 2024-11-20 ENCOUNTER — Ambulatory Visit: Admitting: *Deleted

## 2024-11-20 DIAGNOSIS — M25512 Pain in left shoulder: Secondary | ICD-10-CM | POA: Diagnosis not present

## 2024-11-20 DIAGNOSIS — M25612 Stiffness of left shoulder, not elsewhere classified: Secondary | ICD-10-CM

## 2024-11-20 DIAGNOSIS — G8929 Other chronic pain: Secondary | ICD-10-CM

## 2024-11-20 DIAGNOSIS — M5459 Other low back pain: Secondary | ICD-10-CM

## 2024-11-20 NOTE — Therapy (Signed)
 OUTPATIENT PHYSICAL THERAPY UPPER EXTREMITY TREATMENT   Patient Name: Louis Perez MRN: [id number] DOB:1951-03-07, 73 y.o., male Today's Date: 11/20/2024  END OF SESSION:  PT End of Session - 11/20/24 1048     Visit Number 2    Number of Visits 12    Date for Recertification  12/29/24    Authorization Type FOTO AT LEAST EVERY 5TH VISIT.  PROGRESS NOTE AT 10TH VISIT.  KX MODIFIER AFTER 15 VISITS.    PT Start Time 1015           Past Medical History:  Diagnosis Date   Arthritis    hips, lower back, knees, shoulders, hands (05/14/2016)   Asthma dx'd in 2014   Bacterial septicemia (HCC) 01/2011   both knee involved   CAD (coronary artery disease)    a. NSTEMI 02/2016 s/p DES to LAD. b. Relook cath due to angina/prior high risk anatomy 05/14/16 -> continued medical therapy given concern that a stent strut could inferere with Cx itself.   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    NSTEMI (non-ST elevated myocardial infarction) (HCC) 02/2016   thelbert 03/02/2016   Recurrent upper respiratory infection (URI)    Sinus bradycardia    Unspecified tinnitus    right   Past Surgical History:  Procedure Laterality Date   BACK SURGERY     CARDIAC CATHETERIZATION N/A 03/01/2016   Procedure: Left Heart Cath and Coronary Angiography;  Surgeon: Ozell Fell, MD;  oLAD 95%, CFX/RI/RCA/RPDA 25-30%   CARDIAC CATHETERIZATION N/A 03/01/2016   Procedure: Coronary Stent Intervention;  Surgeon: Ozell Fell, MD;  Promus Premier 4.0 x  12 mm DES to oLAD   CARDIAC CATHETERIZATION N/A 05/14/2016   Procedure: Left Heart Cath and Coronary Angiography;  Surgeon: Debby DELENA Sor, MD;  Location: [facility] INVASIVE CV LAB;  Service: Cardiovascular;  Laterality: N/A;   CARDIAC CATHETERIZATION N/A 06/17/2016   Procedure: Intravascular Pressure Wire/FFR Study;  Surgeon: Ozell Fell, MD;  Location: [facility] INVASIVE CV LAB;   Service: Cardiovascular;  Laterality: N/A;   CARDIOVASCULAR STRESS TEST  05/26/2012   EKG negative for ischemia, no ECG changes, no significant ischemia noted   I & D KNEE WITH POLY EXCHANGE Bilateral 01/2011   & complete synovectomy of 3 compartments of the right total knee/notes 02/04/2011 (replacement,partial knee replacement because of bacterial infection)   JOINT REPLACEMENT     KNEE ARTHROSCOPY Right ~ 2002   LEFT HEART CATH AND CORONARY ANGIOGRAPHY N/A 04/28/2023   Procedure: LEFT HEART CATH AND CORONARY ANGIOGRAPHY;  Surgeon: Anner Alm ORN, MD;  Location: [facility] INVASIVE CV LAB;  Service: Cardiovascular;  Laterality: N/A;   LUMBAR DISC SURGERY  ~ 1980; 2000   NASAL SINUS SURGERY  08/2011   SHOULDER ARTHROSCOPY W/ ROTATOR CUFF REPAIR Right 01/2015   TOTAL KNEE ARTHROPLASTY Bilateral 03/26/2004-09/15/2004    right-left   TRANSTHORACIC ECHOCARDIOGRAM  05/26/2012   EF >55%, mild concentric LVH   Patient Active Problem List   Diagnosis Date Noted   Constipation 06/29/2022   Other specified abnormal findings of blood chemistry 06/29/2022   Severe persistent asthma with acute exacerbation (HCC) 11/25/2020   Shortness of breath 11/25/2020   Viral upper respiratory infection 11/25/2020   Asthma, severe persistent, well-controlled (HCC) 11/09/2018   Other allergic rhinitis 11/09/2018   LPRD (laryngopharyngeal reflux disease) 11/09/2018   Stomatitis and mucositis 11/09/2018   Tinnitus of both  ears 09/05/2018   Visual disturbance 09/01/2018   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   Sensorineural hearing loss (SNHL) of right ear 07/11/2018   Pansinusitis 01/06/2017   Hyperlipidemia LDL goal <70 07/23/2016   Anginal chest pain at rest 06/17/2016   Stented coronary artery 06/15/2016   Pain in the chest 05/28/2016   Sinus bradycardia 05/15/2016   Unstable angina (HCC) 05/14/2016   CAD (coronary artery disease), native coronary artery 05/14/2016   Adjustment insomnia 02/28/2016   Gilbert's  syndrome 02/28/2016   Hematospermia 02/28/2016   Relative erythrocytosis 02/28/2016   Dyslipidemia 09/28/2013   GERD (gastroesophageal reflux disease) 09/28/2013   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    THERAPY DIAG:  Chronic left shoulder pain  Stiffness of left shoulder, not elsewhere classified  Other low back pain  Rationale for Evaluation and Treatment: Rehabilitation  ONSET DATE: Surgery date (11/15/24).    SUBJECTIVE:   SUBJECTIVE STATEMENT: The patient presents to the clinic s/p left reverse total shoulder replacement on 11/15/24. Did okay after Eval.   PERTINENT HISTORY: See above.   PAIN:  Are you having pain? No  PRECAUTIONS: Other: Progress per protocol.  No ultrasound.    RED FLAGS: None   WEIGHT BEARING RESTRICTIONS: No left UE weight bearing.    FALLS:  Has patient fallen in last 6 months? Couple times.  LIVING ENVIRONMENT: Lives with: lives with their spouse Lives in: House/apartment Has following equipment at home: None  OCCUPATION: Retired.    PLOF: Independent  PATIENT GOALS: Use left UE without pain.    OBJECTIVE:   PATIENT SURVEYS:  QUICKDASH:  46 points (79.55%).     PALPATION: Ecchymosis noted.  Aquacel intact.    UPPER EXTREMITY ROM:  In supine:  Gentle PROM into left shoulder flexion to 90 degrees with increased pain and ER to 15 degrees.                                                                                                                               TREATMENT DATE: 11/20/24:    In supine: Supine cane ER x 3 mins  Gentle P/AROM x 25 minutes to patient's left shoulder   vasopneumatic on low with pillow between thorax and elbow x 15 minutes.      PATIENT EDUCATION:  Education details: See below.   Person educated:  Patient Education method: Explanation, Demonstration, Tactile cues, Verbal cues, and Handouts Education comprehension: verbalized understanding, returned demonstration, verbal cues required, and tactile cues required  HOME EXERCISE PROGRAM: CANE CANE  [[id number]]  Seated Wand UE External Rotation -  Repeat 10 Repetitions, Hold 10 Seconds, Complete 2 Sets, Perform 3 Times a Day  Closed chain flexion stretch -  Repeat 10 Repetitions, Hold 10 Seconds, Complete 2 Sets, Perform 4 Times a Day  WAND EXTERNAL ROTATION - SUPINE ER -  Repeat 10 Repetitions, Hold 10 Seconds, Complete 2 Sets, Perform 4 Times a Day  ASSESSMENT:  CLINICAL IMPRESSION: The patient presents to OPPT s/p left reverse total shoulder replacement performed on 11/15/24.Rx focused on PROM LT shldr and continues to do well. Minimal pain today.To MD tomorrow   OBJECTIVE IMPAIRMENTS: decreased activity tolerance, decreased ROM, and pain.   ACTIVITY LIMITATIONS: carrying, lifting, dressing, and reach over head  PARTICIPATION LIMITATIONS: meal prep, cleaning, laundry, and yard work  PERSONAL FACTORS: Time since onset of injury/illness/exacerbation are also affecting patient's functional outcome.   REHAB POTENTIAL: Excellent  CLINICAL DECISION MAKING: Stable/uncomplicated  EVALUATION COMPLEXITY: Low   GOALS:   LONG TERM GOALS: Target date: 02/09/25  Ind with a HEP.  Goal status: INITIAL  2.  Active left shoulder flexion to 145 degrees so the patient can easily reach overhead.  Goal status: INITIAL  3.  Active ER to 60 degrees+ to allow for easily donning/doffing of apparel.  Goal status: INITIAL  4.  Perform ADL's with pain not > 3/10.  Goal status: INITIAL  5.  Improve QUICKDASH score by at least 15 points.  Goal status: INITIAL  PLAN:  PT FREQUENCY: 2x/week  PT DURATION: 6 weeks  PLANNED INTERVENTIONS: 97110-Therapeutic exercises, 97530- Therapeutic activity, [id number]- Neuromuscular re-education, 97535- Self  Care, 02859- Manual therapy, G0283- Electrical stimulation (unattended), 97016- Vasopneumatic device, Patient/Family education, and Cryotherapy  PLAN FOR NEXT SESSION: Progress per protocol.  Vasopneumatic with pillow between thorax and elbow.     Nalin Mazzocco,CHRIS, PTA 11/20/2024, 10:57 AM

## 2024-11-23 ENCOUNTER — Ambulatory Visit: Admitting: *Deleted

## 2024-11-23 ENCOUNTER — Encounter: Payer: Self-pay | Admitting: *Deleted

## 2024-11-23 DIAGNOSIS — M25612 Stiffness of left shoulder, not elsewhere classified: Secondary | ICD-10-CM

## 2024-11-23 DIAGNOSIS — M25512 Pain in left shoulder: Secondary | ICD-10-CM | POA: Diagnosis not present

## 2024-11-23 DIAGNOSIS — G8929 Other chronic pain: Secondary | ICD-10-CM

## 2024-11-23 NOTE — Therapy (Signed)
 OUTPATIENT PHYSICAL THERAPY UPPER EXTREMITY TREATMENT   Patient Name: Louis Perez MRN: [id number] DOB:June 11, 1951, 73 y.o., male Today's Date: 11/23/2024  END OF SESSION:  PT End of Session - 11/23/24 1520     Number of Visits 12    Date for Recertification  12/29/24           Past Medical History:  Diagnosis Date   Arthritis    hips, lower back, knees, shoulders, hands (05/14/2016)   Asthma dx'd in 2014   Bacterial septicemia (HCC) 01/2011   both knee involved   CAD (coronary artery disease)    a. NSTEMI 02/2016 s/p DES to LAD. b. Relook cath due to angina/prior high risk anatomy 05/14/16 -> continued medical therapy given concern that a stent strut could inferere with Cx itself.   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    NSTEMI (non-ST elevated myocardial infarction) (HCC) 02/2016   thelbert 03/02/2016   Recurrent upper respiratory infection (URI)    Sinus bradycardia    Unspecified tinnitus    right   Past Surgical History:  Procedure Laterality Date   BACK SURGERY     CARDIAC CATHETERIZATION N/A 03/01/2016   Procedure: Left Heart Cath and Coronary Angiography;  Surgeon: Ozell Fell, MD;  oLAD 95%, CFX/RI/RCA/RPDA 25-30%   CARDIAC CATHETERIZATION N/A 03/01/2016   Procedure: Coronary Stent Intervention;  Surgeon: Ozell Fell, MD;  Promus Premier 4.0 x  12 mm DES to oLAD   CARDIAC CATHETERIZATION N/A 05/14/2016   Procedure: Left Heart Cath and Coronary Angiography;  Surgeon: Debby DELENA Sor, MD;  Location: [facility] INVASIVE CV LAB;  Service: Cardiovascular;  Laterality: N/A;   CARDIAC CATHETERIZATION N/A 06/17/2016   Procedure: Intravascular Pressure Wire/FFR Study;  Surgeon: Ozell Fell, MD;  Location: [facility] INVASIVE CV LAB;  Service: Cardiovascular;  Laterality: N/A;   CARDIOVASCULAR STRESS TEST  05/26/2012   EKG negative for ischemia, no ECG changes, no significant ischemia  noted   I & D KNEE WITH POLY EXCHANGE Bilateral 01/2011   & complete synovectomy of 3 compartments of the right total knee/notes 02/04/2011 (replacement,partial knee replacement because of bacterial infection)   JOINT REPLACEMENT     KNEE ARTHROSCOPY Right ~ 2002   LEFT HEART CATH AND CORONARY ANGIOGRAPHY N/A 04/28/2023   Procedure: LEFT HEART CATH AND CORONARY ANGIOGRAPHY;  Surgeon: Anner Alm ORN, MD;  Location: [facility] INVASIVE CV LAB;  Service: Cardiovascular;  Laterality: N/A;   LUMBAR DISC SURGERY  ~ 1980; 2000   NASAL SINUS SURGERY  08/2011   SHOULDER ARTHROSCOPY W/ ROTATOR CUFF REPAIR Right 01/2015   TOTAL KNEE ARTHROPLASTY Bilateral 03/26/2004-09/15/2004    right-left   TRANSTHORACIC ECHOCARDIOGRAM  05/26/2012   EF >55%, mild concentric LVH   Patient Active Problem List   Diagnosis Date Noted   Constipation 06/29/2022   Other specified abnormal findings of blood chemistry 06/29/2022   Severe persistent asthma with acute exacerbation (HCC) 11/25/2020   Shortness of breath 11/25/2020   Viral upper respiratory infection 11/25/2020   Asthma, severe persistent, well-controlled (HCC) 11/09/2018   Other allergic rhinitis 11/09/2018   LPRD (laryngopharyngeal reflux disease) 11/09/2018   Stomatitis and mucositis 11/09/2018   Tinnitus of both ears 09/05/2018   Visual disturbance 09/01/2018   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   Sensorineural hearing loss (SNHL) of right ear 07/11/2018   Pansinusitis 01/06/2017   Hyperlipidemia LDL goal <70 07/23/2016  Anginal chest pain at rest 06/17/2016   Stented coronary artery 06/15/2016   Pain in the chest 05/28/2016   Sinus bradycardia 05/15/2016   Unstable angina (HCC) 05/14/2016   CAD (coronary artery disease), native coronary artery 05/14/2016   Adjustment insomnia 02/28/2016   Gilbert's syndrome 02/28/2016   Hematospermia 02/28/2016   Relative erythrocytosis 02/28/2016   Dyslipidemia 09/28/2013   GERD (gastroesophageal reflux disease)  09/28/2013   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    THERAPY DIAG:  Chronic left shoulder pain  Stiffness of left shoulder, not elsewhere classified  Rationale for Evaluation and Treatment: Rehabilitation  ONSET DATE: Surgery date (11/15/24).    SUBJECTIVE:   SUBJECTIVE STATEMENT: The patient presents to the clinic s/p left reverse total shoulder replacement on 11/15/24. Did okay after Eval.   PERTINENT HISTORY: See above.   PAIN:  Are you having pain? No  PRECAUTIONS: Other: Progress per protocol.  No ultrasound.    RED FLAGS: None   WEIGHT BEARING RESTRICTIONS: No left UE weight bearing.    FALLS:  Has patient fallen in last 6 months? Couple times.  LIVING ENVIRONMENT: Lives with: lives with their spouse Lives in: House/apartment Has following equipment at home: None  OCCUPATION: Retired.    PLOF: Independent  PATIENT GOALS: Use left UE without pain.    OBJECTIVE:   PATIENT SURVEYS:  QUICKDASH:  46 points (79.55%).     PALPATION: Ecchymosis noted.  Aquacel intact.    UPPER EXTREMITY ROM:  In supine:  Gentle PROM into left shoulder flexion to 90 degrees with increased pain and ER to 15 degrees.                                                                                                                               TREATMENT DATE: 11/23/24:    In supine: Seating UE ranger   x 5 mins forw/back  Gentle P/AROM x 25 minutes to patient's left shoulder   vasopneumatic on low with pillow between thorax and elbow x 15 minutes.      PATIENT EDUCATION:  Education details: See below.   Person educated: Patient Education method: Explanation, Demonstration, Tactile cues, Verbal cues, and Handouts Education comprehension: verbalized understanding, returned  demonstration, verbal cues required, and tactile cues required  HOME EXERCISE PROGRAM: CANE CANE  [[id number]]  Seated Wand UE External Rotation -  Repeat 10 Repetitions, Hold 10 Seconds, Complete 2 Sets, Perform 3 Times a Day  Closed chain flexion stretch -  Repeat 10 Repetitions, Hold 10 Seconds, Complete 2 Sets, Perform 4 Times a Day  WAND EXTERNAL ROTATION - SUPINE ER -  Repeat 10 Repetitions, Hold 10 Seconds, Complete 2 Sets, Perform 4 Times a Day  ASSESSMENT:  CLINICAL IMPRESSION: The patient presents to OPPT doing fairly well with LT shldr.  Rx focused on PROM/ AAROM LT shldr and continues to do well. Minimal pain today end of session   OBJECTIVE IMPAIRMENTS: decreased activity tolerance, decreased ROM, and pain.   ACTIVITY LIMITATIONS: carrying, lifting, dressing, and reach over head  PARTICIPATION LIMITATIONS: meal prep, cleaning, laundry, and yard work  PERSONAL FACTORS: Time since onset of injury/illness/exacerbation are also affecting patient's functional outcome.   REHAB POTENTIAL: Excellent  CLINICAL DECISION MAKING: Stable/uncomplicated  EVALUATION COMPLEXITY: Low   GOALS:   LONG TERM GOALS: Target date: 02/09/25  Ind with a HEP.  Goal status: INITIAL  2.  Active left shoulder flexion to 145 degrees so the patient can easily reach overhead.  Goal status: INITIAL  3.  Active ER to 60 degrees+ to allow for easily donning/doffing of apparel.  Goal status: INITIAL  4.  Perform ADL's with pain not > 3/10.  Goal status: INITIAL  5.  Improve QUICKDASH score by at least 15 points.  Goal status: INITIAL  PLAN:  PT FREQUENCY: 2x/week  PT DURATION: 6 weeks  PLANNED INTERVENTIONS: 97110-Therapeutic exercises, 97530- Therapeutic activity, [id number]- Neuromuscular re-education, 97535- Self Care, 02859- Manual therapy, G0283- Electrical stimulation (unattended), 97016- Vasopneumatic device, Patient/Family education, and Cryotherapy  PLAN FOR NEXT SESSION: Progress  per protocol.  Vasopneumatic with pillow between thorax and elbow.     Zeferino Mounts,CHRIS, PTA 11/23/2024, 3:21 PM

## 2024-11-27 ENCOUNTER — Ambulatory Visit

## 2024-11-27 DIAGNOSIS — G8929 Other chronic pain: Secondary | ICD-10-CM

## 2024-11-27 DIAGNOSIS — M25512 Pain in left shoulder: Secondary | ICD-10-CM | POA: Diagnosis not present

## 2024-11-27 DIAGNOSIS — M25612 Stiffness of left shoulder, not elsewhere classified: Secondary | ICD-10-CM

## 2024-11-27 NOTE — Therapy (Signed)
 OUTPATIENT PHYSICAL THERAPY UPPER EXTREMITY TREATMENT   Patient Name: Louis Perez MRN: [id number] DOB:[birth year], 73 y.o., male Today's Date: 11/27/2024  END OF SESSION:  PT End of Session - 11/27/24 1434     Visit Number 4    Number of Visits 12    Date for Recertification  12/29/24    PT Start Time 1430    PT Stop Time 1517    PT Time Calculation (min) 47 min    Activity Tolerance Patient tolerated treatment well    Behavior During Therapy [facility] for tasks assessed/performed           Past Medical History:  Diagnosis Date   Arthritis    hips, lower back, knees, shoulders, hands (05/14/2016)   Asthma dx'd in 2014   Bacterial septicemia (HCC) 01/2011   both knee involved   CAD (coronary artery disease)    a. NSTEMI 02/2016 s/p DES to LAD. b. Relook cath due to angina/prior high risk anatomy 05/14/16 -> continued medical therapy given concern that a stent strut could inferere with Cx itself.   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    NSTEMI (non-ST elevated myocardial infarction) (HCC) 02/2016   thelbert 03/02/2016   Recurrent upper respiratory infection (URI)    Sinus bradycardia    Unspecified tinnitus    right   Past Surgical History:  Procedure Laterality Date   BACK SURGERY     CARDIAC CATHETERIZATION N/A 03/01/2016   Procedure: Left Heart Cath and Coronary Angiography;  Surgeon: Ozell Fell, MD;  oLAD 95%, CFX/RI/RCA/RPDA 25-30%   CARDIAC CATHETERIZATION N/A 03/01/2016   Procedure: Coronary Stent Intervention;  Surgeon: Ozell Fell, MD;  Promus Premier 4.0 x  12 mm DES to oLAD   CARDIAC CATHETERIZATION N/A 05/14/2016   Procedure: Left Heart Cath and Coronary Angiography;  Surgeon: Debby DELENA Sor, MD;  Location: [facility] INVASIVE CV LAB;  Service: Cardiovascular;  Laterality: N/A;   CARDIAC CATHETERIZATION N/A 06/17/2016   Procedure: Intravascular Pressure Wire/FFR  Study;  Surgeon: Ozell Fell, MD;  Location: [facility] INVASIVE CV LAB;  Service: Cardiovascular;  Laterality: N/A;   CARDIOVASCULAR STRESS TEST  05/26/2012   EKG negative for ischemia, no ECG changes, no significant ischemia noted   I & D KNEE WITH POLY EXCHANGE Bilateral 01/2011   & complete synovectomy of 3 compartments of the right total knee/notes 02/04/2011 (replacement,partial knee replacement because of bacterial infection)   JOINT REPLACEMENT     KNEE ARTHROSCOPY Right ~ 2002   LEFT HEART CATH AND CORONARY ANGIOGRAPHY N/A 04/28/2023   Procedure: LEFT HEART CATH AND CORONARY ANGIOGRAPHY;  Surgeon: Anner Alm ORN, MD;  Location: [facility] INVASIVE CV LAB;  Service: Cardiovascular;  Laterality: N/A;   LUMBAR DISC SURGERY  ~ 1980; 2000   NASAL SINUS SURGERY  08/2011   SHOULDER ARTHROSCOPY W/ ROTATOR CUFF REPAIR Right 01/2015   TOTAL KNEE ARTHROPLASTY Bilateral 03/26/2004-09/15/2004    right-left   TRANSTHORACIC ECHOCARDIOGRAM  05/26/2012   EF >55%, mild concentric LVH   Patient Active Problem List   Diagnosis Date Noted   Constipation 06/29/2022   Other specified abnormal findings of blood chemistry 06/29/2022   Severe persistent asthma with acute exacerbation (HCC) 11/25/2020   Shortness of breath 11/25/2020   Viral upper respiratory infection 11/25/2020   Asthma, severe persistent, well-controlled (HCC) 11/09/2018   Other allergic rhinitis 11/09/2018   LPRD (laryngopharyngeal reflux disease)  11/09/2018   Stomatitis and mucositis 11/09/2018   Tinnitus of both ears 09/05/2018   Visual disturbance 09/01/2018   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   Sensorineural hearing loss (SNHL) of right ear 07/11/2018   Pansinusitis 01/06/2017   Hyperlipidemia LDL goal <70 07/23/2016   Anginal chest pain at rest 06/17/2016   Stented coronary artery 06/15/2016   Pain in the chest 05/28/2016   Sinus bradycardia 05/15/2016   Unstable angina (HCC) 05/14/2016   CAD (coronary artery disease), native coronary  artery 05/14/2016   Adjustment insomnia 02/28/2016   Gilbert's syndrome 02/28/2016   Hematospermia 02/28/2016   Relative erythrocytosis 02/28/2016   Dyslipidemia 09/28/2013   GERD (gastroesophageal reflux disease) 09/28/2013   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    THERAPY DIAG:  Chronic left shoulder pain  Stiffness of left shoulder, not elsewhere classified  Rationale for Evaluation and Treatment: Rehabilitation  ONSET DATE: Surgery date (11/15/24).    SUBJECTIVE:   SUBJECTIVE STATEMENT: Pt denies any pain today.   PERTINENT HISTORY: See above.   PAIN:  Are you having pain? No  PRECAUTIONS: Other: Progress per protocol.  No ultrasound.    RED FLAGS: None   WEIGHT BEARING RESTRICTIONS: No left UE weight bearing.    FALLS:  Has patient fallen in last 6 months? Couple times.  LIVING ENVIRONMENT: Lives with: lives with their spouse Lives in: House/apartment Has following equipment at home: None  OCCUPATION: Retired.    PLOF: Independent  PATIENT GOALS: Use left UE without pain.    OBJECTIVE:   PATIENT SURVEYS:  QUICKDASH:  46 points (79.55%).     PALPATION: Ecchymosis noted.  Aquacel intact.    UPPER EXTREMITY ROM:  In supine:  Gentle PROM into left shoulder flexion to 90 degrees with increased pain and ER to 15 degrees.                                                                                                                               TREATMENT DATE:    11/27/24: In supine: Seating UE ranger x 7 mins forw/back Gentle P/AROM x 25 minutes to patient's left shoulder   vasopneumatic on low with pillow between thorax and elbow x 15 minutes.      PATIENT EDUCATION:  Education details: See below.   Person educated: Patient Education method:  Explanation, Demonstration, Tactile cues, Verbal cues, and Handouts Education comprehension: verbalized understanding, returned demonstration, verbal cues required, and tactile cues required  HOME EXERCISE PROGRAM: CANE CANE  [[id number]]  Seated Wand UE External Rotation -  Repeat 10 Repetitions, Hold 10 Seconds, Complete 2 Sets, Perform 3 Times a Day  Closed chain flexion stretch -  Repeat 10 Repetitions, Hold 10 Seconds, Complete 2 Sets, Perform 4 Times a Day  WAND EXTERNAL ROTATION - SUPINE ER -  Repeat 10 Repetitions, Hold 10 Seconds, Complete 2 Sets, Perform 4 Times a Day  ASSESSMENT:  CLINICAL IMPRESSION: Pt arrives for today's treatment session denying any pain.  Pt states that he has been performing exercises at home as instructed.  Pt reports very minimal pain since having surgery.  Today's treatment focused on P/AAROM into flexion and ER.  Normal responses to vaso noted upon removal. Pt denied any pain at completion of today's treatment session.   OBJECTIVE IMPAIRMENTS: decreased activity tolerance, decreased ROM, and pain.   ACTIVITY LIMITATIONS: carrying, lifting, dressing, and reach over head  PARTICIPATION LIMITATIONS: meal prep, cleaning, laundry, and yard work  PERSONAL FACTORS: Time since onset of injury/illness/exacerbation are also affecting patient's functional outcome.   REHAB POTENTIAL: Excellent  CLINICAL DECISION MAKING: Stable/uncomplicated  EVALUATION COMPLEXITY: Low   GOALS:   LONG TERM GOALS: Target date: 02/09/25  Ind with a HEP.  Goal status: INITIAL  2.  Active left shoulder flexion to 145 degrees so the patient can easily reach overhead.  Goal status: INITIAL  3.  Active ER to 60 degrees+ to allow for easily donning/doffing of apparel.  Goal status: INITIAL  4.  Perform ADL's with pain not > 3/10.  Goal status: INITIAL  5.  Improve QUICKDASH score by at least 15 points.  Goal status: INITIAL  PLAN:  PT FREQUENCY: 2x/week  PT  DURATION: 6 weeks  PLANNED INTERVENTIONS: 97110-Therapeutic exercises, 97530- Therapeutic activity, [id number]- Neuromuscular re-education, 97535- Self Care, 02859- Manual therapy, G0283- Electrical stimulation (unattended), 97016- Vasopneumatic device, Patient/Family education, and Cryotherapy  PLAN FOR NEXT SESSION: Progress per protocol.  Vasopneumatic with pillow between thorax and elbow.     Delon DELENA Gosling, PTA 11/27/2024, 3:54 PM

## 2024-11-30 ENCOUNTER — Encounter: Payer: Self-pay | Admitting: Physical Therapy

## 2024-11-30 ENCOUNTER — Ambulatory Visit: Admitting: Physical Therapy

## 2024-11-30 DIAGNOSIS — M25512 Pain in left shoulder: Secondary | ICD-10-CM | POA: Diagnosis not present

## 2024-11-30 DIAGNOSIS — M25612 Stiffness of left shoulder, not elsewhere classified: Secondary | ICD-10-CM

## 2024-11-30 DIAGNOSIS — G8929 Other chronic pain: Secondary | ICD-10-CM

## 2024-11-30 DIAGNOSIS — M5459 Other low back pain: Secondary | ICD-10-CM

## 2024-11-30 NOTE — Therapy (Signed)
 OUTPATIENT PHYSICAL THERAPY UPPER EXTREMITY TREATMENT   Patient Name: Louis Perez MRN: [id number] DOB:03-Sep-1951, 73 y.o., male Today's Date: 11/30/2024  END OF SESSION:  PT End of Session - 11/30/24 1021     Visit Number 5    Number of Visits 12    Date for Recertification  12/29/24    PT Start Time 1017    PT Stop Time 1055    PT Time Calculation (min) 38 min    Activity Tolerance Patient tolerated treatment well    Behavior During Therapy [facility] for tasks assessed/performed            Past Medical History:  Diagnosis Date   Arthritis    hips, lower back, knees, shoulders, hands (05/14/2016)   Asthma dx'd in 2014   Bacterial septicemia (HCC) 01/2011   both knee involved   CAD (coronary artery disease)    a. NSTEMI 02/2016 s/p DES to LAD. b. Relook cath due to angina/prior high risk anatomy 05/14/16 -> continued medical therapy given concern that a stent strut could inferere with Cx itself.   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    NSTEMI (non-ST elevated myocardial infarction) (HCC) 02/2016   thelbert 03/02/2016   Recurrent upper respiratory infection (URI)    Sinus bradycardia    Unspecified tinnitus    right   Past Surgical History:  Procedure Laterality Date   BACK SURGERY     CARDIAC CATHETERIZATION N/A 03/01/2016   Procedure: Left Heart Cath and Coronary Angiography;  Surgeon: Ozell Fell, MD;  oLAD 95%, CFX/RI/RCA/RPDA 25-30%   CARDIAC CATHETERIZATION N/A 03/01/2016   Procedure: Coronary Stent Intervention;  Surgeon: Ozell Fell, MD;  Promus Premier 4.0 x  12 mm DES to oLAD   CARDIAC CATHETERIZATION N/A 05/14/2016   Procedure: Left Heart Cath and Coronary Angiography;  Surgeon: Debby DELENA Sor, MD;  Location: [facility] INVASIVE CV LAB;  Service: Cardiovascular;  Laterality: N/A;   CARDIAC CATHETERIZATION N/A 06/17/2016   Procedure: Intravascular Pressure Wire/FFR  Study;  Surgeon: Ozell Fell, MD;  Location: [facility] INVASIVE CV LAB;  Service: Cardiovascular;  Laterality: N/A;   CARDIOVASCULAR STRESS TEST  05/26/2012   EKG negative for ischemia, no ECG changes, no significant ischemia noted   I & D KNEE WITH POLY EXCHANGE Bilateral 01/2011   & complete synovectomy of 3 compartments of the right total knee/notes 02/04/2011 (replacement,partial knee replacement because of bacterial infection)   JOINT REPLACEMENT     KNEE ARTHROSCOPY Right ~ 2002   LEFT HEART CATH AND CORONARY ANGIOGRAPHY N/A 04/28/2023   Procedure: LEFT HEART CATH AND CORONARY ANGIOGRAPHY;  Surgeon: Anner Alm ORN, MD;  Location: [facility] INVASIVE CV LAB;  Service: Cardiovascular;  Laterality: N/A;   LUMBAR DISC SURGERY  ~ 1980; 2000   NASAL SINUS SURGERY  08/2011   SHOULDER ARTHROSCOPY W/ ROTATOR CUFF REPAIR Right 01/2015   TOTAL KNEE ARTHROPLASTY Bilateral 03/26/2004-09/15/2004    right-left   TRANSTHORACIC ECHOCARDIOGRAM  05/26/2012   EF >55%, mild concentric LVH   Patient Active Problem List   Diagnosis Date Noted   Constipation 06/29/2022   Other specified abnormal findings of blood chemistry 06/29/2022   Severe persistent asthma with acute exacerbation (HCC) 11/25/2020   Shortness of breath 11/25/2020   Viral upper respiratory infection 11/25/2020   Asthma, severe persistent, well-controlled (HCC) 11/09/2018   Other allergic rhinitis 11/09/2018   LPRD (laryngopharyngeal reflux  disease) 11/09/2018   Stomatitis and mucositis 11/09/2018   Tinnitus of both ears 09/05/2018   Visual disturbance 09/01/2018   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   Sensorineural hearing loss (SNHL) of right ear 07/11/2018   Pansinusitis 01/06/2017   Hyperlipidemia LDL goal <70 07/23/2016   Anginal chest pain at rest 06/17/2016   Stented coronary artery 06/15/2016   Pain in the chest 05/28/2016   Sinus bradycardia 05/15/2016   Unstable angina (HCC) 05/14/2016   CAD (coronary artery disease), native coronary  artery 05/14/2016   Adjustment insomnia 02/28/2016   Gilbert's syndrome 02/28/2016   Hematospermia 02/28/2016   Relative erythrocytosis 02/28/2016   Dyslipidemia 09/28/2013   GERD (gastroesophageal reflux disease) 09/28/2013   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    THERAPY DIAG:  Chronic left shoulder pain  Stiffness of left shoulder, not elsewhere classified  Other low back pain  Rationale for Evaluation and Treatment: Rehabilitation  ONSET DATE: Surgery date (11/15/24).    SUBJECTIVE:   SUBJECTIVE STATEMENT: Pt is 2 weeks post op. Has been taking some time out of the brace.   PERTINENT HISTORY: See above.   PAIN:  Are you having pain? No  PRECAUTIONS: Other: Progress per protocol.  No ultrasound.    RED FLAGS: None   WEIGHT BEARING RESTRICTIONS: No left UE weight bearing.    FALLS:  Has patient fallen in last 6 months? Couple times.  LIVING ENVIRONMENT: Lives with: lives with their spouse Lives in: House/apartment Has following equipment at home: None  OCCUPATION: Retired.    PLOF: Independent  PATIENT GOALS: Use left UE without pain.    OBJECTIVE:   PATIENT SURVEYS:  QUICKDASH:  46 points (79.55%).     PALPATION: Ecchymosis noted.  Aquacel intact.    UPPER EXTREMITY ROM: In supine:  Gentle PROM into left shoulder flexion to 90 degrees with increased pain and ER to 15 degrees.                                                                                                                               TREATMENT DATE:  11/30/24: In supine: Elbow flex/ext x20 Wrist flex/ext x20 Wrist pron/sup x20 Wrist radial/ulnar deviation x20 Hand flexion/ext x20 Gentle PROM flexion, scaption and ER within protocol In sidelying: scap retraction 2x10 scapular  clock 2x10 all directions Seated UE ranger 2x10 fwd/bwd focusing on scapular protraction/retraction   11/27/24: In supine: Seating UE ranger x 7 mins forw/back Gentle P/AROM x 25 minutes to patient's left shoulder   vasopneumatic on low with pillow between thorax and elbow x 15 minutes.      PATIENT EDUCATION:  Education details: See below.   Person educated: Patient Education method: Explanation, Demonstration, Tactile cues, Verbal cues, and Handouts Education comprehension: verbalized understanding, returned demonstration, verbal cues required, and  tactile cues required  HOME EXERCISE PROGRAM: CANE CANE  [[id number]]  Seated Wand UE External Rotation -  Repeat 10 Repetitions, Hold 10 Seconds, Complete 2 Sets, Perform 3 Times a Day  Closed chain flexion stretch -  Repeat 10 Repetitions, Hold 10 Seconds, Complete 2 Sets, Perform 4 Times a Day  WAND EXTERNAL ROTATION - SUPINE ER -  Repeat 10 Repetitions, Hold 10 Seconds, Complete 2 Sets, Perform 4 Times a Day  ASSESSMENT:  CLINICAL IMPRESSION: Pain remains well managed. PROM feels very smooth into available ranges of his reverse shoulder protocol. Added elbow, wrist, hand and scapular strengthening today with good pt tolerance.   OBJECTIVE IMPAIRMENTS: decreased activity tolerance, decreased ROM, and pain.   ACTIVITY LIMITATIONS: carrying, lifting, dressing, and reach over head  PARTICIPATION LIMITATIONS: meal prep, cleaning, laundry, and yard work  PERSONAL FACTORS: Time since onset of injury/illness/exacerbation are also affecting patient's functional outcome.   REHAB POTENTIAL: Excellent  CLINICAL DECISION MAKING: Stable/uncomplicated  EVALUATION COMPLEXITY: Low   GOALS:   LONG TERM GOALS: Target date: 02/09/25  Ind with a HEP.  Goal status: INITIAL  2.  Active left shoulder flexion to 145 degrees so the patient can easily reach overhead.  Goal status: INITIAL  3.  Active ER to 60 degrees+ to allow for easily  donning/doffing of apparel.  Goal status: INITIAL  4.  Perform ADL's with pain not > 3/10.  Goal status: INITIAL  5.  Improve QUICKDASH score by at least 15 points.  Goal status: INITIAL  PLAN:  PT FREQUENCY: 2x/week  PT DURATION: 6 weeks  PLANNED INTERVENTIONS: 97110-Therapeutic exercises, 97530- Therapeutic activity, [id number]- Neuromuscular re-education, 97535- Self Care, 02859- Manual therapy, G0283- Electrical stimulation (unattended), 97016- Vasopneumatic device, Patient/Family education, and Cryotherapy  PLAN FOR NEXT SESSION: Progress per protocol.  Vasopneumatic with pillow between thorax and elbow.     Gianna Calef April Ma L Tatiyanna Lashley, PT 11/30/2024, 10:21 AM

## 2024-12-04 ENCOUNTER — Ambulatory Visit: Admitting: Physical Therapy

## 2024-12-04 DIAGNOSIS — M25612 Stiffness of left shoulder, not elsewhere classified: Secondary | ICD-10-CM

## 2024-12-04 DIAGNOSIS — M25512 Pain in left shoulder: Secondary | ICD-10-CM | POA: Diagnosis not present

## 2024-12-04 DIAGNOSIS — M5459 Other low back pain: Secondary | ICD-10-CM

## 2024-12-04 DIAGNOSIS — G8929 Other chronic pain: Secondary | ICD-10-CM

## 2024-12-04 NOTE — Therapy (Signed)
 " OUTPATIENT PHYSICAL THERAPY UPPER EXTREMITY TREATMENT   Patient Name: Louis Perez MRN: [id number] DOB:[birth year], 73 y.o., male Today's Date: 12/04/2024  END OF SESSION:  PT End of Session - 12/04/24 1421     Visit Number 6    Number of Visits 12    Date for Recertification  12/29/24    Authorization Type FOTO AT LEAST EVERY 5TH VISIT.  PROGRESS NOTE AT 10TH VISIT.  KX MODIFIER AFTER 15 VISITS.    PT Start Time 0147    PT Stop Time 0238    PT Time Calculation (min) 51 min    Activity Tolerance Patient tolerated treatment well    Behavior During Therapy [facility] for tasks assessed/performed            Past Medical History:  Diagnosis Date   Arthritis    hips, lower back, knees, shoulders, hands (05/14/2016)   Asthma dx'd in 2014   Bacterial septicemia (HCC) 01/2011   both knee involved   CAD (coronary artery disease)    a. NSTEMI 02/2016 s/p DES to LAD. b. Relook cath due to angina/prior high risk anatomy 05/14/16 -> continued medical therapy given concern that a stent strut could inferere with Cx itself.   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    NSTEMI (non-ST elevated myocardial infarction) (HCC) 02/2016   thelbert 03/02/2016   Recurrent upper respiratory infection (URI)    Sinus bradycardia    Unspecified tinnitus    right   Past Surgical History:  Procedure Laterality Date   BACK SURGERY     CARDIAC CATHETERIZATION N/A 03/01/2016   Procedure: Left Heart Cath and Coronary Angiography;  Surgeon: Ozell Fell, MD;  oLAD 95%, CFX/RI/RCA/RPDA 25-30%   CARDIAC CATHETERIZATION N/A 03/01/2016   Procedure: Coronary Stent Intervention;  Surgeon: Ozell Fell, MD;  Promus Premier 4.0 x  12 mm DES to oLAD   CARDIAC CATHETERIZATION N/A 05/14/2016   Procedure: Left Heart Cath and Coronary Angiography;  Surgeon: Debby DELENA Sor, MD;  Location: [facility] INVASIVE CV LAB;  Service:  Cardiovascular;  Laterality: N/A;   CARDIAC CATHETERIZATION N/A 06/17/2016   Procedure: Intravascular Pressure Wire/FFR Study;  Surgeon: Ozell Fell, MD;  Location: [facility] INVASIVE CV LAB;  Service: Cardiovascular;  Laterality: N/A;   CARDIOVASCULAR STRESS TEST  05/26/2012   EKG negative for ischemia, no ECG changes, no significant ischemia noted   I & D KNEE WITH POLY EXCHANGE Bilateral 01/2011   & complete synovectomy of 3 compartments of the right total knee/notes 02/04/2011 (replacement,partial knee replacement because of bacterial infection)   JOINT REPLACEMENT     KNEE ARTHROSCOPY Right ~ 2002   LEFT HEART CATH AND CORONARY ANGIOGRAPHY N/A 04/28/2023   Procedure: LEFT HEART CATH AND CORONARY ANGIOGRAPHY;  Surgeon: Anner Alm ORN, MD;  Location: [facility] INVASIVE CV LAB;  Service: Cardiovascular;  Laterality: N/A;   LUMBAR DISC SURGERY  ~ 1980; 2000   NASAL SINUS SURGERY  08/2011   SHOULDER ARTHROSCOPY W/ ROTATOR CUFF REPAIR Right 01/2015   TOTAL KNEE ARTHROPLASTY Bilateral 03/26/2004-09/15/2004    right-left   TRANSTHORACIC ECHOCARDIOGRAM  05/26/2012   EF >55%, mild concentric LVH   Patient Active Problem List   Diagnosis Date Noted   Constipation 06/29/2022   Other specified abnormal findings of blood chemistry 06/29/2022   Severe persistent asthma with acute exacerbation (HCC) 11/25/2020   Shortness of breath 11/25/2020  Viral upper respiratory infection 11/25/2020   Asthma, severe persistent, well-controlled (HCC) 11/09/2018   Other allergic rhinitis 11/09/2018   LPRD (laryngopharyngeal reflux disease) 11/09/2018   Stomatitis and mucositis 11/09/2018   Tinnitus of both ears 09/05/2018   Visual disturbance 09/01/2018   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   Sensorineural hearing loss (SNHL) of right ear 07/11/2018   Pansinusitis 01/06/2017   Hyperlipidemia LDL goal <70 07/23/2016   Anginal chest pain at rest 06/17/2016   Stented coronary artery 06/15/2016   Pain in the chest 05/28/2016    Sinus bradycardia 05/15/2016   Unstable angina (HCC) 05/14/2016   CAD (coronary artery disease), native coronary artery 05/14/2016   Adjustment insomnia 02/28/2016   Gilbert's syndrome 02/28/2016   Hematospermia 02/28/2016   Relative erythrocytosis 02/28/2016   Dyslipidemia 09/28/2013   GERD (gastroesophageal reflux disease) 09/28/2013   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    THERAPY DIAG:  Chronic left shoulder pain  Stiffness of left shoulder, not elsewhere classified  Other low back pain  Rationale for Evaluation and Treatment: Rehabilitation  ONSET DATE: Surgery date (11/15/24).    SUBJECTIVE:   SUBJECTIVE STATEMENT: Tripped going upstairs on Saturday (12/02/24) and landed on left elbow (bruised).  Patient not reporting any increased left shoulder pain.    PERTINENT HISTORY: See above.   PAIN:  Are you having pain? No  PRECAUTIONS: Other: Progress per protocol.  No ultrasound.    RED FLAGS: None   WEIGHT BEARING RESTRICTIONS: No left UE weight bearing.    FALLS:  Has patient fallen in last 6 months? Couple times.  LIVING ENVIRONMENT: Lives with: lives with their spouse Lives in: House/apartment Has following equipment at home: None  OCCUPATION: Retired.    PLOF: Independent  PATIENT GOALS: Use left UE without pain.    OBJECTIVE:   PATIENT SURVEYS:  QUICKDASH:  46 points (79.55%).     PALPATION: Ecchymosis noted.  Aquacel intact.    UPPER EXTREMITY ROM: In supine:  Gentle PROM into left shoulder flexion to 90 degrees with increased pain and ER to 15 degrees.                                                                                                                               TREATMENT DATE:   12/04/24:  In supine:  P/AROM x 23 minutes into  left shoulder flexion and ER f/b vasopneumatic x 15 minutes with pillow between left elbow and thorax.    11/30/24: In supine: Elbow flex/ext x20 Wrist flex/ext x20 Wrist pron/sup x20 Wrist radial/ulnar deviation x20 Hand flexion/ext x20 Gentle PROM flexion, scaption and ER within protocol In sidelying: scap retraction 2x10 scapular clock 2x10 all directions Seated UE ranger 2x10 fwd/bwd focusing on scapular protraction/retraction   11/27/24: In supine: Seating UE ranger x 7  mins forw/back Gentle P/AROM x 25 minutes to patient's left shoulder   vasopneumatic on low with pillow between thorax and elbow x 15 minutes.      PATIENT EDUCATION:  Education details: See below.   Person educated: Patient Education method: Explanation, Demonstration, Tactile cues, Verbal cues, and Handouts Education comprehension: verbalized understanding, returned demonstration, verbal cues required, and tactile cues required  HOME EXERCISE PROGRAM: CANE CANE  [[id number]]  Seated Wand UE External Rotation -  Repeat 10 Repetitions, Hold 10 Seconds, Complete 2 Sets, Perform 3 Times a Day  Closed chain flexion stretch -  Repeat 10 Repetitions, Hold 10 Seconds, Complete 2 Sets, Perform 4 Times a Day  WAND EXTERNAL ROTATION - SUPINE ER -  Repeat 10 Repetitions, Hold 10 Seconds, Complete 2 Sets, Perform 4 Times a Day  ASSESSMENT:  CLINICAL IMPRESSION: Patient tripped on a step Saturday (12/20) and fell on his left elbow but states his left shoulder feels fine.  He tolerated treatment very well today.   OBJECTIVE IMPAIRMENTS: decreased activity tolerance, decreased ROM, and pain.   ACTIVITY LIMITATIONS: carrying, lifting, dressing, and reach over head  PARTICIPATION LIMITATIONS: meal prep, cleaning, laundry, and yard work  PERSONAL FACTORS: Time since onset of injury/illness/exacerbation are also affecting patient's functional outcome.   REHAB POTENTIAL: Excellent  CLINICAL DECISION MAKING:  Stable/uncomplicated  EVALUATION COMPLEXITY: Low   GOALS:   LONG TERM GOALS: Target date: 02/09/25  Ind with a HEP.  Goal status: INITIAL  2.  Active left shoulder flexion to 145 degrees so the patient can easily reach overhead.  Goal status: INITIAL  3.  Active ER to 60 degrees+ to allow for easily donning/doffing of apparel.  Goal status: INITIAL  4.  Perform ADL's with pain not > 3/10.  Goal status: INITIAL  5.  Improve QUICKDASH score by at least 15 points.  Goal status: INITIAL  PLAN:  PT FREQUENCY: 2x/week  PT DURATION: 6 weeks  PLANNED INTERVENTIONS: 97110-Therapeutic exercises, 97530- Therapeutic activity, [id number]- Neuromuscular re-education, 97535- Self Care, 02859- Manual therapy, G0283- Electrical stimulation (unattended), 97016- Vasopneumatic device, Patient/Family education, and Cryotherapy  PLAN FOR NEXT SESSION: Progress per protocol.  Vasopneumatic with pillow between thorax and elbow.     Jasminemarie Sherrard, PT 12/04/2024, 3:09 PM  "

## 2024-12-05 ENCOUNTER — Ambulatory Visit (INDEPENDENT_AMBULATORY_CARE_PROVIDER_SITE_OTHER)

## 2024-12-05 DIAGNOSIS — J4550 Severe persistent asthma, uncomplicated: Secondary | ICD-10-CM | POA: Diagnosis not present

## 2024-12-06 ENCOUNTER — Ambulatory Visit: Admitting: *Deleted

## 2024-12-06 DIAGNOSIS — G8929 Other chronic pain: Secondary | ICD-10-CM

## 2024-12-06 DIAGNOSIS — M25512 Pain in left shoulder: Secondary | ICD-10-CM | POA: Diagnosis not present

## 2024-12-06 DIAGNOSIS — M25612 Stiffness of left shoulder, not elsewhere classified: Secondary | ICD-10-CM

## 2024-12-06 NOTE — Therapy (Signed)
 " OUTPATIENT PHYSICAL THERAPY UPPER EXTREMITY TREATMENT   Patient Name: Louis Perez MRN: [id number] DOB:Jul 17, 1951, 73 y.o., male Today's Date: 12/06/2024  END OF SESSION:  PT End of Session - 12/06/24 1020     Visit Number 7    Number of Visits 12    Date for Recertification  12/29/24    PT Start Time 1021    PT Stop Time 1110    PT Time Calculation (min) 49 min    Activity Tolerance Patient tolerated treatment well            Past Medical History:  Diagnosis Date   Arthritis    hips, lower back, knees, shoulders, hands (05/14/2016)   Asthma dx'd in 2014   Bacterial septicemia (HCC) 01/2011   both knee involved   CAD (coronary artery disease)    a. NSTEMI 02/2016 s/p DES to LAD. b. Relook cath due to angina/prior high risk anatomy 05/14/16 -> continued medical therapy given concern that a stent strut could inferere with Cx itself.   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    NSTEMI (non-ST elevated myocardial infarction) (HCC) 02/2016   thelbert 03/02/2016   Recurrent upper respiratory infection (URI)    Sinus bradycardia    Unspecified tinnitus    right   Past Surgical History:  Procedure Laterality Date   BACK SURGERY     CARDIAC CATHETERIZATION N/A 03/01/2016   Procedure: Left Heart Cath and Coronary Angiography;  Surgeon: Ozell Fell, MD;  oLAD 95%, CFX/RI/RCA/RPDA 25-30%   CARDIAC CATHETERIZATION N/A 03/01/2016   Procedure: Coronary Stent Intervention;  Surgeon: Ozell Fell, MD;  Promus Premier 4.0 x  12 mm DES to oLAD   CARDIAC CATHETERIZATION N/A 05/14/2016   Procedure: Left Heart Cath and Coronary Angiography;  Surgeon: Debby DELENA Sor, MD;  Location: [facility] INVASIVE CV LAB;  Service: Cardiovascular;  Laterality: N/A;   CARDIAC CATHETERIZATION N/A 06/17/2016   Procedure: Intravascular Pressure Wire/FFR Study;  Surgeon: Ozell Fell, MD;  Location: [facility] INVASIVE CV  LAB;  Service: Cardiovascular;  Laterality: N/A;   CARDIOVASCULAR STRESS TEST  05/26/2012   EKG negative for ischemia, no ECG changes, no significant ischemia noted   I & D KNEE WITH POLY EXCHANGE Bilateral 01/2011   & complete synovectomy of 3 compartments of the right total knee/notes 02/04/2011 (replacement,partial knee replacement because of bacterial infection)   JOINT REPLACEMENT     KNEE ARTHROSCOPY Right ~ 2002   LEFT HEART CATH AND CORONARY ANGIOGRAPHY N/A 04/28/2023   Procedure: LEFT HEART CATH AND CORONARY ANGIOGRAPHY;  Surgeon: Anner Alm ORN, MD;  Location: [facility] INVASIVE CV LAB;  Service: Cardiovascular;  Laterality: N/A;   LUMBAR DISC SURGERY  ~ 1980; 2000   NASAL SINUS SURGERY  08/2011   SHOULDER ARTHROSCOPY W/ ROTATOR CUFF REPAIR Right 01/2015   TOTAL KNEE ARTHROPLASTY Bilateral 03/26/2004-09/15/2004    right-left   TRANSTHORACIC ECHOCARDIOGRAM  05/26/2012   EF >55%, mild concentric LVH   Patient Active Problem List   Diagnosis Date Noted   Constipation 06/29/2022   Other specified abnormal findings of blood chemistry 06/29/2022   Severe persistent asthma with acute exacerbation (HCC) 11/25/2020   Shortness of breath 11/25/2020   Viral upper respiratory infection 11/25/2020   Asthma, severe persistent, well-controlled (HCC) 11/09/2018   Other allergic rhinitis 11/09/2018   LPRD (laryngopharyngeal reflux disease) 11/09/2018   Stomatitis and mucositis 11/09/2018  Tinnitus of both ears 09/05/2018   Visual disturbance 09/01/2018   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   Sensorineural hearing loss (SNHL) of right ear 07/11/2018   Pansinusitis 01/06/2017   Hyperlipidemia LDL goal <70 07/23/2016   Anginal chest pain at rest 06/17/2016   Stented coronary artery 06/15/2016   Pain in the chest 05/28/2016   Sinus bradycardia 05/15/2016   Unstable angina (HCC) 05/14/2016   CAD (coronary artery disease), native coronary artery 05/14/2016   Adjustment insomnia 02/28/2016   Gilbert's  syndrome 02/28/2016   Hematospermia 02/28/2016   Relative erythrocytosis 02/28/2016   Dyslipidemia 09/28/2013   GERD (gastroesophageal reflux disease) 09/28/2013   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    THERAPY DIAG:  Chronic left shoulder pain  Stiffness of left shoulder, not elsewhere classified  Rationale for Evaluation and Treatment: Rehabilitation  ONSET DATE: Surgery date (11/15/24).    SUBJECTIVE:   SUBJECTIVE STATEMENT: Tripped going upstairs on Saturday (12/02/24) and landed on left elbow (bruised).  Doing better today   PERTINENT HISTORY: See above.   PAIN:  Are you having pain? No  PRECAUTIONS: Other: Progress per protocol.  No ultrasound.    RED FLAGS: None   WEIGHT BEARING RESTRICTIONS: No left UE weight bearing.    FALLS:  Has patient fallen in last 6 months? Couple times.  LIVING ENVIRONMENT: Lives with: lives with their spouse Lives in: House/apartment Has following equipment at home: None  OCCUPATION: Retired.    PLOF: Independent  PATIENT GOALS: Use left UE without pain.    OBJECTIVE:   PATIENT SURVEYS:  QUICKDASH:  46 points (79.55%).     PALPATION: Ecchymosis noted.  Aquacel intact.    UPPER EXTREMITY ROM: In supine:  Gentle PROM into left shoulder flexion to 90 degrees with increased pain and ER to 15 degrees.                                                                                                                               TREATMENT DATE:    RT shldr   12/06/24:  Seated UE ranger x 7  mins  In supine:  P/AROM x 23 minutes into left shoulder flexion and ER and rhythmic stab for ER/IR as well as flexion vasopneumatic x 15 minutes with pillow between left elbow and thorax.    11/30/24: In supine: Elbow flex/ext x20 Wrist  flex/ext x20 Wrist pron/sup x20 Wrist radial/ulnar deviation x20 Hand flexion/ext x20 Gentle PROM flexion, scaption and ER within protocol In sidelying: scap retraction 2x10 scapular clock 2x10 all directions Seated UE ranger 2x10 fwd/bwd focusing on scapular protraction/retraction   11/27/24: In supine: Seating UE ranger x 7 mins forw/back Gentle P/AROM x 25 minutes to patient's left shoulder   vasopneumatic on low with pillow between thorax and elbow x 15  minutes.      PATIENT EDUCATION:  Education details: See below.   Person educated: Patient Education method: Explanation, Demonstration, Tactile cues, Verbal cues, and Handouts Education comprehension: verbalized understanding, returned demonstration, verbal cues required, and tactile cues required  HOME EXERCISE PROGRAM: CANE CANE  [[id number]]  Seated Wand UE External Rotation -  Repeat 10 Repetitions, Hold 10 Seconds, Complete 2 Sets, Perform 3 Times a Day  Closed chain flexion stretch -  Repeat 10 Repetitions, Hold 10 Seconds, Complete 2 Sets, Perform 4 Times a Day  WAND EXTERNAL ROTATION - SUPINE ER -  Repeat 10 Repetitions, Hold 10 Seconds, Complete 2 Sets, Perform 4 Times a Day  ASSESSMENT:  CLINICAL IMPRESSION: Patient tripped on a step Saturday (12/20) and fell on his left elbow but states his left shoulder feels fine. Rx focused on seated AAROM with ranger as well as manual P/AAROM supine and did great.   OBJECTIVE IMPAIRMENTS: decreased activity tolerance, decreased ROM, and pain.   ACTIVITY LIMITATIONS: carrying, lifting, dressing, and reach over head  PARTICIPATION LIMITATIONS: meal prep, cleaning, laundry, and yard work  PERSONAL FACTORS: Time since onset of injury/illness/exacerbation are also affecting patient's functional outcome.   REHAB POTENTIAL: Excellent  CLINICAL DECISION MAKING: Stable/uncomplicated  EVALUATION COMPLEXITY: Low   GOALS:   LONG TERM GOALS: Target date: 02/09/25  Ind with  a HEP.  Goal status: INITIAL  2.  Active left shoulder flexion to 145 degrees so the patient can easily reach overhead.  Goal status: INITIAL  3.  Active ER to 60 degrees+ to allow for easily donning/doffing of apparel.  Goal status: INITIAL  4.  Perform ADL's with pain not > 3/10.  Goal status: INITIAL  5.  Improve QUICKDASH score by at least 15 points.  Goal status: INITIAL  PLAN:  PT FREQUENCY: 2x/week  PT DURATION: 6 weeks  PLANNED INTERVENTIONS: 97110-Therapeutic exercises, 97530- Therapeutic activity, [id number]- Neuromuscular re-education, 97535- Self Care, 02859- Manual therapy, G0283- Electrical stimulation (unattended), 97016- Vasopneumatic device, Patient/Family education, and Cryotherapy  PLAN FOR NEXT SESSION: Progress per protocol.  Vasopneumatic with pillow between thorax and elbow.     Santez Woodcox,CHRIS, PTA 12/06/2024, 12:24 PM  "

## 2024-12-11 ENCOUNTER — Ambulatory Visit: Admitting: Physical Therapy

## 2024-12-11 DIAGNOSIS — M25512 Pain in left shoulder: Secondary | ICD-10-CM | POA: Diagnosis not present

## 2024-12-11 DIAGNOSIS — G8929 Other chronic pain: Secondary | ICD-10-CM

## 2024-12-11 DIAGNOSIS — M5459 Other low back pain: Secondary | ICD-10-CM

## 2024-12-11 DIAGNOSIS — M25612 Stiffness of left shoulder, not elsewhere classified: Secondary | ICD-10-CM

## 2024-12-11 NOTE — Therapy (Signed)
 " OUTPATIENT PHYSICAL THERAPY UPPER EXTREMITY TREATMENT   Patient Name: Louis Perez MRN: [id number] DOB:[birth year], 73 y.o., male Today's Date: 12/11/2024  END OF SESSION:  PT End of Session - 12/11/24 0936     Visit Number 8    Number of Visits 12    Date for Recertification  12/29/24    Authorization Type FOTO AT LEAST EVERY 5TH VISIT.  PROGRESS NOTE AT 10TH VISIT.  KX MODIFIER AFTER 15 VISITS.    PT Start Time 0845    PT Stop Time 0937    PT Time Calculation (min) 52 min    Activity Tolerance Patient tolerated treatment well    Behavior During Therapy [facility] for tasks assessed/performed            Past Medical History:  Diagnosis Date   Arthritis    hips, lower back, knees, shoulders, hands (05/14/2016)   Asthma dx'd in 2014   Bacterial septicemia (HCC) 01/2011   both knee involved   CAD (coronary artery disease)    a. NSTEMI 02/2016 s/p DES to LAD. b. Relook cath due to angina/prior high risk anatomy 05/14/16 -> continued medical therapy given concern that a stent strut could inferere with Cx itself.   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    NSTEMI (non-ST elevated myocardial infarction) (HCC) 02/2016   thelbert 03/02/2016   Recurrent upper respiratory infection (URI)    Sinus bradycardia    Unspecified tinnitus    right   Past Surgical History:  Procedure Laterality Date   BACK SURGERY     CARDIAC CATHETERIZATION N/A 03/01/2016   Procedure: Left Heart Cath and Coronary Angiography;  Surgeon: Ozell Fell, MD;  oLAD 95%, CFX/RI/RCA/RPDA 25-30%   CARDIAC CATHETERIZATION N/A 03/01/2016   Procedure: Coronary Stent Intervention;  Surgeon: Ozell Fell, MD;  Promus Premier 4.0 x  12 mm DES to oLAD   CARDIAC CATHETERIZATION N/A 05/14/2016   Procedure: Left Heart Cath and Coronary Angiography;  Surgeon: Debby DELENA Sor, MD;  Location: [facility] INVASIVE CV LAB;  Service:  Cardiovascular;  Laterality: N/A;   CARDIAC CATHETERIZATION N/A 06/17/2016   Procedure: Intravascular Pressure Wire/FFR Study;  Surgeon: Ozell Fell, MD;  Location: [facility] INVASIVE CV LAB;  Service: Cardiovascular;  Laterality: N/A;   CARDIOVASCULAR STRESS TEST  05/26/2012   EKG negative for ischemia, no ECG changes, no significant ischemia noted   I & D KNEE WITH POLY EXCHANGE Bilateral 01/2011   & complete synovectomy of 3 compartments of the right total knee/notes 02/04/2011 (replacement,partial knee replacement because of bacterial infection)   JOINT REPLACEMENT     KNEE ARTHROSCOPY Right ~ 2002   LEFT HEART CATH AND CORONARY ANGIOGRAPHY N/A 04/28/2023   Procedure: LEFT HEART CATH AND CORONARY ANGIOGRAPHY;  Surgeon: Anner Alm ORN, MD;  Location: [facility] INVASIVE CV LAB;  Service: Cardiovascular;  Laterality: N/A;   LUMBAR DISC SURGERY  ~ 1980; 2000   NASAL SINUS SURGERY  08/2011   SHOULDER ARTHROSCOPY W/ ROTATOR CUFF REPAIR Right 01/2015   TOTAL KNEE ARTHROPLASTY Bilateral 03/26/2004-09/15/2004    right-left   TRANSTHORACIC ECHOCARDIOGRAM  05/26/2012   EF >55%, mild concentric LVH   Patient Active Problem List   Diagnosis Date Noted   Constipation 06/29/2022   Other specified abnormal findings of blood chemistry 06/29/2022   Severe persistent asthma with acute exacerbation (HCC) 11/25/2020   Shortness of breath 11/25/2020  Viral upper respiratory infection 11/25/2020   Asthma, severe persistent, well-controlled (HCC) 11/09/2018   Other allergic rhinitis 11/09/2018   LPRD (laryngopharyngeal reflux disease) 11/09/2018   Stomatitis and mucositis 11/09/2018   Tinnitus of both ears 09/05/2018   Visual disturbance 09/01/2018   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   Sensorineural hearing loss (SNHL) of right ear 07/11/2018   Pansinusitis 01/06/2017   Hyperlipidemia LDL goal <70 07/23/2016   Anginal chest pain at rest 06/17/2016   Stented coronary artery 06/15/2016   Pain in the chest 05/28/2016    Sinus bradycardia 05/15/2016   Unstable angina (HCC) 05/14/2016   CAD (coronary artery disease), native coronary artery 05/14/2016   Adjustment insomnia 02/28/2016   Gilbert's syndrome 02/28/2016   Hematospermia 02/28/2016   Relative erythrocytosis 02/28/2016   Dyslipidemia 09/28/2013   GERD (gastroesophageal reflux disease) 09/28/2013   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    THERAPY DIAG:  Chronic left shoulder pain  Stiffness of left shoulder, not elsewhere classified  Other low back pain  Rationale for Evaluation and Treatment: Rehabilitation  ONSET DATE: Surgery date (11/15/24).    SUBJECTIVE:   SUBJECTIVE STATEMENT: Tripped going upstairs on Saturday (12/02/24) and landed on left elbow (bruised).  Doing better today   PERTINENT HISTORY: See above.   PAIN:  Are you having pain? No  PRECAUTIONS: Other: Progress per protocol.  No ultrasound.    RED FLAGS: None   WEIGHT BEARING RESTRICTIONS: No left UE weight bearing.    FALLS:  Has patient fallen in last 6 months? Couple times.  LIVING ENVIRONMENT: Lives with: lives with their spouse Lives in: House/apartment Has following equipment at home: None  OCCUPATION: Retired.    PLOF: Independent  PATIENT GOALS: Use left UE without pain.    OBJECTIVE:   PATIENT SURVEYS:  QUICKDASH:  46 points (79.55%).     PALPATION: Ecchymosis noted.  Aquacel intact.    UPPER EXTREMITY ROM: In supine:  Gentle PROM into left shoulder flexion to 90 degrees with increased pain and ER to 15 degrees.                                                                                                                               TREATMENT DATE:      12/11/24:  P/AROM x 23 minutes into left shoulder flexion and ER and rhythmic stab  for ER/IR as well as flexion vasopneumatic x 15 minutes with pillow between left elbow and thorax.     RT shldr   12/06/24:  Seated UE ranger x 7  mins  In supine:  P/AROM x 23 minutes into left shoulder flexion and ER and rhythmic stab for ER/IR as well as flexion vasopneumatic x 15 minutes with pillow between left elbow and thorax.     PATIENT EDUCATION:  Education details: See below.   Person educated: Patient Education method: Explanation, Demonstration, Tactile cues, Verbal cues, and Handouts Education comprehension: verbalized understanding, returned demonstration, verbal cues required, and tactile cues required  HOME EXERCISE PROGRAM: CANE CANE  [[id number]]  Seated Wand UE External Rotation -  Repeat 10 Repetitions, Hold 10 Seconds, Complete 2 Sets, Perform 3 Times a Day  Closed chain flexion stretch -  Repeat 10 Repetitions, Hold 10 Seconds, Complete 2 Sets, Perform 4 Times a Day  WAND EXTERNAL ROTATION - SUPINE ER -  Repeat 10 Repetitions, Hold 10 Seconds, Complete 2 Sets, Perform 4 Times a Day  ASSESSMENT:  CLINICAL IMPRESSION: Patient is progressing well per protocol.   OBJECTIVE IMPAIRMENTS: decreased activity tolerance, decreased ROM, and pain.   ACTIVITY LIMITATIONS: carrying, lifting, dressing, and reach over head  PARTICIPATION LIMITATIONS: meal prep, cleaning, laundry, and yard work  PERSONAL FACTORS: Time since onset of injury/illness/exacerbation are also affecting patient's functional outcome.   REHAB POTENTIAL: Excellent  CLINICAL DECISION MAKING: Stable/uncomplicated  EVALUATION COMPLEXITY: Low   GOALS:   LONG TERM GOALS: Target date: 02/09/25  Ind with a HEP.  Goal status: INITIAL  2.  Active left shoulder flexion to 145 degrees so the patient can easily reach overhead.  Goal status: INITIAL  3.  Active ER to 60 degrees+ to allow for easily donning/doffing of apparel.  Goal status: INITIAL  4.  Perform ADL's with pain not > 3/10.   Goal status: INITIAL  5.  Improve QUICKDASH score by at least 15 points.  Goal status: INITIAL  PLAN:  PT FREQUENCY: 2x/week  PT DURATION: 6 weeks  PLANNED INTERVENTIONS: 97110-Therapeutic exercises, 97530- Therapeutic activity, [id number]- Neuromuscular re-education, 97535- Self Care, 02859- Manual therapy, G0283- Electrical stimulation (unattended), 97016- Vasopneumatic device, Patient/Family education, and Cryotherapy  PLAN FOR NEXT SESSION: Progress per protocol.  Vasopneumatic with pillow between thorax and elbow.     Pablo Mathurin, PT 12/11/2024, 9:42 AM  "

## 2024-12-12 ENCOUNTER — Other Ambulatory Visit: Payer: Self-pay

## 2024-12-12 DIAGNOSIS — E785 Hyperlipidemia, unspecified: Secondary | ICD-10-CM

## 2024-12-12 DIAGNOSIS — I25118 Atherosclerotic heart disease of native coronary artery with other forms of angina pectoris: Secondary | ICD-10-CM

## 2024-12-12 DIAGNOSIS — I10 Essential (primary) hypertension: Secondary | ICD-10-CM

## 2024-12-12 MED ORDER — OLMESARTAN MEDOXOMIL 20 MG PO TABS: 40.0000 mg | ORAL_TABLET | Freq: Every day | ORAL | 3 refills | Status: DC

## 2024-12-13 ENCOUNTER — Encounter (HOSPITAL_BASED_OUTPATIENT_CLINIC_OR_DEPARTMENT_OTHER): Admitting: Family

## 2024-12-15 ENCOUNTER — Ambulatory Visit: Attending: Orthopaedic Surgery | Admitting: Physical Therapy

## 2024-12-15 DIAGNOSIS — M25612 Stiffness of left shoulder, not elsewhere classified: Secondary | ICD-10-CM | POA: Insufficient documentation

## 2024-12-15 DIAGNOSIS — M25512 Pain in left shoulder: Secondary | ICD-10-CM | POA: Diagnosis present

## 2024-12-15 DIAGNOSIS — G8929 Other chronic pain: Secondary | ICD-10-CM | POA: Insufficient documentation

## 2024-12-15 DIAGNOSIS — H903 Sensorineural hearing loss, bilateral: Secondary | ICD-10-CM | POA: Diagnosis present

## 2024-12-15 DIAGNOSIS — M5459 Other low back pain: Secondary | ICD-10-CM | POA: Diagnosis present

## 2024-12-15 NOTE — Progress Notes (Signed)
 "  [address] [place] [address] Dept: [phone]  FOLLOW UP NOTE  Patient ID: Louis Perez, male    DOB: Apr 15, 1951  Age: 74 y.o. MRN: [id number] Date of Office Visit: 12/18/2024  Assessment  Chief Complaint: Asthma (Doing well. No recent flares. )  HPI Louis Perez is a 74 year old male who presents to clinic for a follow-up visit.  He was last seen in this clinic on 06/06/2024 for evaluation of asthma on Tezspire  injections, allergic rhinitis, reflux, and hoarseness of voice.  Discussed the use of AI scribe software for clinical note transcription with the patient, who gave verbal consent to proceed.  History of Present Illness Louis Perez is a 74 year old male who presents with nasal symptoms and a history of respiratory issues.  In the interim, he reports that he experienced cold-like symptoms around Thanksgiving at which time he began to use his Breztri  inhaler as well as albuterol  by nebulizer with relief of symptoms.  At today's visit, he reports his asthma has been well-controlled with no shortness of breath, cough, or wheeze with activity or rest.  He reports that he has not used his breast tree since around Thanksgiving and has not used albuterol  since around Thanksgiving.  He continues to receive tezspire  injections once every 4 weeks with no large or local reactions.  He reports a significant decrease in his symptoms of asthma while continuing on tezspire  injections.  Allergic rhinitis is reported as moderately well-controlled with occasional symptoms including rhinorrhea, nasal congestion, and sneezing.  He continues a daily antihistamine as well as ipratropium frequently.  He reports that he has started to choke on large and small pieces of food over the last several months.  He describes the sensation as food sticking in his throat.  He reports this used to occur infrequently, however, started to occur frequently over the last 2-3 months. He  reports that he has not previously seen a GI specialist and has not previously experienced this situation prior to 2 months ago.  He reports reflux has been well-controlled with no symptoms including heartburn or vomiting.  He continues famotidine  40 mg and infrequently uses Carafate .  He reports occasional voice hoarseness.  He does report that he has previously visited Dr. Floy, ENT specialist, however, has not recently seen an ENT specialist.  Dr. Floy is retired.  He is amenable to referral to a new ENT specialist for further evaluation of voice hoarseness.    His current medications are listed in the chart.   Drug Allergies:  Allergies[1]  Physical Exam: BP 118/80   Pulse 66   Temp 98.5 F (36.9 C)   Resp 19   SpO2 95%    Physical Exam Vitals reviewed.  Constitutional:      Appearance: Normal appearance.  HENT:     Head: Normocephalic and atraumatic.     Right Ear: Tympanic membrane normal.     Left Ear: Tympanic membrane normal.     Nose:     Comments: Bilateral naris slightly erythematous with thin clear nasal drainage noted.  Slight nasal deviation noted.  Pharynx normal.  Ears normal.  Eyes normal.    Mouth/Throat:     Pharynx: Oropharynx is clear.  Eyes:     Conjunctiva/sclera: Conjunctivae normal.  Cardiovascular:     Rate and Rhythm: Normal rate and regular rhythm.  Pulmonary:     Effort: Pulmonary effort is normal.     Breath sounds: Normal breath sounds.  Comments: Lungs clear to auscultation Musculoskeletal:        General: Normal range of motion.     Cervical back: Normal range of motion and neck supple.  Skin:    General: Skin is warm and dry.  Neurological:     Mental Status: He is alert and oriented to person, place, and time.  Psychiatric:        Mood and Affect: Mood normal.        Behavior: Behavior normal.        Thought Content: Thought content normal.        Judgment: Judgment normal.     Diagnostics: FVC  3.49 which is 71% of  predicted value. FEV1 2.34 which is 65% of predicted value. Spirometry indicates possible restriction. This is consistent with previous spirometry testing.   Assessment and Plan: 1. Asthma, severe persistent, well-controlled (HCC)   2. LPRD (laryngopharyngeal reflux disease)   3. Hoarseness   4. Other allergic rhinitis   5. Dysphagia, unspecified type     No orders of the defined types were placed in this encounter.   Patient Instructions   1. Continue Tezepelumab  injections every 4 weeks  2. Continue famotidine  40 mg - 1 time per day  3. Continue the following if needed:  A. Albuterol  HFA - 2 inhalations every 4-6 hours B. Ipratropium 0.06% - 1-2 sprays each nostril every 6 hours   C. OTC antihistamine - claritin  / allegra / zyrtec  D. Breztri  - 1-2 inhalations 1-2 times per day w/ spacer  E. Clortrimazole troche after Breztri  use    4. Evaluation of dysphagia with GI  5. Evaluation of throat with ENT for hoarseness   6. Return to clinic in 6 months or earlier if problem  Return in about 6 months (around 06/17/2025), or if symptoms worsen or fail to improve.    Thank you for the opportunity to care for this patient.  Please do not hesitate to contact me with questions.  Arlean Mutter, FNP [facility]          [1]  Allergies Allergen Reactions   Oxycodone Anaphylaxis and Swelling   Celecoxib Other (See Comments)   Ambien [Zolpidem Tartrate] Other (See Comments)    Mood changes    "

## 2024-12-15 NOTE — Patient Instructions (Addendum)
" °  1. Continue Tezepelumab  injections every 4 weeks  2. Continue famotidine  40 mg - 1 time per day  3. Continue the following if needed:  A. Albuterol  HFA - 2 inhalations every 4-6 hours B. Ipratropium 0.06% - 1-2 sprays each nostril every 6 hours   C. OTC antihistamine - claritin  / allegra / zyrtec  D. Breztri  - 1-2 inhalations 1-2 times per day w/ spacer  E. Clortrimazole troche after Breztri  use    4. Evaluation of dysphagia with GI  5. Evaluation of throat with ENT for hoarseness   6. Return to clinic in 6 months or earlier if problem "

## 2024-12-15 NOTE — Therapy (Signed)
 " OUTPATIENT PHYSICAL THERAPY UPPER EXTREMITY TREATMENT   Patient Name: Louis Perez MRN: [id number] DOB:[birth year], 74 y.o., male Today's Date: 12/15/2024  END OF SESSION:  PT End of Session - 12/15/24 0854     Visit Number 9    Number of Visits 12    Date for Recertification  12/29/24    PT Start Time 0845    PT Stop Time 0945    PT Time Calculation (min) 60 min    Activity Tolerance Patient tolerated treatment well    Behavior During Therapy [facility] for tasks assessed/performed            Past Medical History:  Diagnosis Date   Arthritis    hips, lower back, knees, shoulders, hands (05/14/2016)   Asthma dx'd in 2014   Bacterial septicemia (HCC) 01/2011   both knee involved   CAD (coronary artery disease)    a. NSTEMI 02/2016 s/p DES to LAD. b. Relook cath due to angina/prior high risk anatomy 05/14/16 -> continued medical therapy given concern that a stent strut could inferere with Cx itself.   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    NSTEMI (non-ST elevated myocardial infarction) (HCC) 02/2016   thelbert 03/02/2016   Recurrent upper respiratory infection (URI)    Sinus bradycardia    Unspecified tinnitus    right   Past Surgical History:  Procedure Laterality Date   BACK SURGERY     CARDIAC CATHETERIZATION N/A 03/01/2016   Procedure: Left Heart Cath and Coronary Angiography;  Surgeon: Ozell Fell, MD;  oLAD 95%, CFX/RI/RCA/RPDA 25-30%   CARDIAC CATHETERIZATION N/A 03/01/2016   Procedure: Coronary Stent Intervention;  Surgeon: Ozell Fell, MD;  Promus Premier 4.0 x  12 mm DES to oLAD   CARDIAC CATHETERIZATION N/A 05/14/2016   Procedure: Left Heart Cath and Coronary Angiography;  Surgeon: Debby DELENA Sor, MD;  Location: [facility] INVASIVE CV LAB;  Service: Cardiovascular;  Laterality: N/A;   CARDIAC CATHETERIZATION N/A 06/17/2016   Procedure: Intravascular Pressure Wire/FFR  Study;  Surgeon: Ozell Fell, MD;  Location: [facility] INVASIVE CV LAB;  Service: Cardiovascular;  Laterality: N/A;   CARDIOVASCULAR STRESS TEST  05/26/2012   EKG negative for ischemia, no ECG changes, no significant ischemia noted   I & D KNEE WITH POLY EXCHANGE Bilateral 01/2011   & complete synovectomy of 3 compartments of the right total knee/notes 02/04/2011 (replacement,partial knee replacement because of bacterial infection)   JOINT REPLACEMENT     KNEE ARTHROSCOPY Right ~ 2002   LEFT HEART CATH AND CORONARY ANGIOGRAPHY N/A 04/28/2023   Procedure: LEFT HEART CATH AND CORONARY ANGIOGRAPHY;  Surgeon: Anner Alm ORN, MD;  Location: [facility] INVASIVE CV LAB;  Service: Cardiovascular;  Laterality: N/A;   LUMBAR DISC SURGERY  ~ 1980; 2000   NASAL SINUS SURGERY  08/2011   SHOULDER ARTHROSCOPY W/ ROTATOR CUFF REPAIR Right 01/2015   TOTAL KNEE ARTHROPLASTY Bilateral 03/26/2004-09/15/2004    right-left   TRANSTHORACIC ECHOCARDIOGRAM  05/26/2012   EF >55%, mild concentric LVH   Patient Active Problem List   Diagnosis Date Noted   Constipation 06/29/2022   Other specified abnormal findings of blood chemistry 06/29/2022   Severe persistent asthma with acute exacerbation (HCC) 11/25/2020   Shortness of breath 11/25/2020   Viral upper respiratory infection 11/25/2020   Asthma, severe persistent, well-controlled (HCC) 11/09/2018   Other allergic rhinitis 11/09/2018   LPRD (laryngopharyngeal  reflux disease) 11/09/2018   Stomatitis and mucositis 11/09/2018   Tinnitus of both ears 09/05/2018   Visual disturbance 09/01/2018   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   Sensorineural hearing loss (SNHL) of right ear 07/11/2018   Pansinusitis 01/06/2017   Hyperlipidemia LDL goal <70 07/23/2016   Anginal chest pain at rest 06/17/2016   Stented coronary artery 06/15/2016   Pain in the chest 05/28/2016   Sinus bradycardia 05/15/2016   Unstable angina (HCC) 05/14/2016   CAD (coronary artery disease), native coronary  artery 05/14/2016   Adjustment insomnia 02/28/2016   Gilbert's syndrome 02/28/2016   Hematospermia 02/28/2016   Relative erythrocytosis 02/28/2016   Dyslipidemia 09/28/2013   GERD (gastroesophageal reflux disease) 09/28/2013   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    THERAPY DIAG:  Chronic left shoulder pain  Stiffness of left shoulder, not elsewhere classified  Other low back pain  Rationale for Evaluation and Treatment: Rehabilitation  ONSET DATE: Surgery date (11/15/24).    SUBJECTIVE:   SUBJECTIVE STATEMENT: Tripped going upstairs on Saturday (12/02/24) and landed on left elbow (bruised).  Doing better today   PERTINENT HISTORY: See above.   PAIN:  Are you having pain? No  PRECAUTIONS: Other: Progress per protocol.  No ultrasound.    RED FLAGS: None   WEIGHT BEARING RESTRICTIONS: No left UE weight bearing.    FALLS:  Has patient fallen in last 6 months? Couple times.  LIVING ENVIRONMENT: Lives with: lives with their spouse Lives in: House/apartment Has following equipment at home: None  OCCUPATION: Retired.    PLOF: Independent  PATIENT GOALS: Use left UE without pain.    OBJECTIVE:   PATIENT SURVEYS:  QUICKDASH:  46 points (79.55%).     PALPATION: Ecchymosis noted.  Aquacel intact.    UPPER EXTREMITY ROM: In supine:  Gentle PROM into left shoulder flexion to 90 degrees with increased pain and ER to 15 degrees.                                                                                                                               TREATMENT DATE:      12/15/24:                                       EXERCISE LOG  Exercise Repetitions and Resistance Comments  Pulleys 5 minutes   UE ranger on wall 6 minutes   Wall ladder 5 minutes   Ball on wall 6  minutes        In supine:  PROM x 16 minutes f/b vasopneumatic x 15 minutes with pillow between left elbow and thorax.     12/11/24:  P/AROM x 23 minutes into left shoulder flexion  and ER and rhythmic stab for ER/IR as well as flexion vasopneumatic x 15 minutes with pillow between left elbow and thorax.     RT shldr   12/06/24:  Seated UE ranger x 7  mins  In supine:  P/AROM x 23 minutes into left shoulder flexion and ER and rhythmic stab for ER/IR as well as flexion vasopneumatic x 15 minutes with pillow between left elbow and thorax.     PATIENT EDUCATION:  Education details: See below.   Person educated: Patient Education method: Explanation, Demonstration, Tactile cues, Verbal cues, and Handouts Education comprehension: verbalized understanding, returned demonstration, verbal cues required, and tactile cues required  HOME EXERCISE PROGRAM: CANE CANE  [[id number]]  Seated Wand UE External Rotation -  Repeat 10 Repetitions, Hold 10 Seconds, Complete 2 Sets, Perform 3 Times a Day  Closed chain flexion stretch -  Repeat 10 Repetitions, Hold 10 Seconds, Complete 2 Sets, Perform 4 Times a Day  WAND EXTERNAL ROTATION - SUPINE ER -  Repeat 10 Repetitions, Hold 10 Seconds, Complete 2 Sets, Perform 4 Times a Day  ASSESSMENT:  CLINICAL IMPRESSION: Patient is progressing well per protocol.   OBJECTIVE IMPAIRMENTS: decreased activity tolerance, decreased ROM, and pain.   ACTIVITY LIMITATIONS: carrying, lifting, dressing, and reach over head  PARTICIPATION LIMITATIONS: meal prep, cleaning, laundry, and yard work  PERSONAL FACTORS: Time since onset of injury/illness/exacerbation are also affecting patient's functional outcome.   REHAB POTENTIAL: Excellent  CLINICAL DECISION MAKING: Stable/uncomplicated  EVALUATION COMPLEXITY: Low   GOALS:   LONG TERM GOALS: Target date: 02/09/25  Ind with a HEP.  Goal status: INITIAL  2.  Active left shoulder flexion to 145 degrees so  the patient can easily reach overhead.  Goal status: INITIAL  3.  Active ER to 60 degrees+ to allow for easily donning/doffing of apparel.  Goal status: INITIAL  4.  Perform ADL's with pain not > 3/10.  Goal status: INITIAL  5.  Improve QUICKDASH score by at least 15 points.  Goal status: INITIAL  PLAN:  PT FREQUENCY: 2x/week  PT DURATION: 6 weeks  PLANNED INTERVENTIONS: 97110-Therapeutic exercises, 97530- Therapeutic activity, [id number]- Neuromuscular re-education, 97535- Self Care, 02859- Manual therapy, G0283- Electrical stimulation (unattended), 97016- Vasopneumatic device, Patient/Family education, and Cryotherapy  PLAN FOR NEXT SESSION: Progress per protocol.  Vasopneumatic with pillow between thorax and elbow.     Knolan Simien, PT 12/15/2024, 10:08 AM  "

## 2024-12-18 ENCOUNTER — Encounter: Payer: Self-pay | Admitting: Family Medicine

## 2024-12-18 ENCOUNTER — Ambulatory Visit: Admitting: Physical Therapy

## 2024-12-18 ENCOUNTER — Ambulatory Visit (INDEPENDENT_AMBULATORY_CARE_PROVIDER_SITE_OTHER): Admitting: Family Medicine

## 2024-12-18 ENCOUNTER — Other Ambulatory Visit: Payer: Self-pay

## 2024-12-18 VITALS — BP 118/80 | HR 66 | Temp 98.5°F | Resp 19

## 2024-12-18 DIAGNOSIS — R490 Dysphonia: Secondary | ICD-10-CM | POA: Insufficient documentation

## 2024-12-18 DIAGNOSIS — R1310 Dysphagia, unspecified: Secondary | ICD-10-CM | POA: Insufficient documentation

## 2024-12-18 DIAGNOSIS — J4550 Severe persistent asthma, uncomplicated: Secondary | ICD-10-CM

## 2024-12-18 DIAGNOSIS — K219 Gastro-esophageal reflux disease without esophagitis: Secondary | ICD-10-CM | POA: Diagnosis not present

## 2024-12-18 DIAGNOSIS — J3089 Other allergic rhinitis: Secondary | ICD-10-CM

## 2024-12-18 DIAGNOSIS — G8929 Other chronic pain: Secondary | ICD-10-CM

## 2024-12-18 DIAGNOSIS — M25612 Stiffness of left shoulder, not elsewhere classified: Secondary | ICD-10-CM

## 2024-12-18 DIAGNOSIS — M25512 Pain in left shoulder: Secondary | ICD-10-CM | POA: Diagnosis not present

## 2024-12-18 NOTE — Therapy (Signed)
 " OUTPATIENT PHYSICAL THERAPY UPPER EXTREMITY TREATMENT   Patient Name: Louis Perez MRN: [id number] DOB:August 22, 1951, 74 y.o., male Today's Date: 12/18/2024  END OF SESSION:  PT End of Session - 12/18/24 1438     Visit Number 10    Number of Visits 12    Date for Recertification  12/29/24    PT Start Time 0230    PT Stop Time 0329    PT Time Calculation (min) 59 min    Activity Tolerance Patient tolerated treatment well    Behavior During Therapy [facility] for tasks assessed/performed            Past Medical History:  Diagnosis Date   Arthritis    hips, lower back, knees, shoulders, hands (05/14/2016)   Asthma dx'd in 2014   Bacterial septicemia (HCC) 01/2011   both knee involved   CAD (coronary artery disease)    a. NSTEMI 02/2016 s/p DES to LAD. b. Relook cath due to angina/prior high risk anatomy 05/14/16 -> continued medical therapy given concern that a stent strut could inferere with Cx itself.   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    NSTEMI (non-ST elevated myocardial infarction) (HCC) 02/2016   thelbert 03/02/2016   Recurrent upper respiratory infection (URI)    Sinus bradycardia    Unspecified tinnitus    right   Past Surgical History:  Procedure Laterality Date   BACK SURGERY     CARDIAC CATHETERIZATION N/A 03/01/2016   Procedure: Left Heart Cath and Coronary Angiography;  Surgeon: Ozell Fell, MD;  oLAD 95%, CFX/RI/RCA/RPDA 25-30%   CARDIAC CATHETERIZATION N/A 03/01/2016   Procedure: Coronary Stent Intervention;  Surgeon: Ozell Fell, MD;  Promus Premier 4.0 x  12 mm DES to oLAD   CARDIAC CATHETERIZATION N/A 05/14/2016   Procedure: Left Heart Cath and Coronary Angiography;  Surgeon: Debby DELENA Sor, MD;  Location: [facility] INVASIVE CV LAB;  Service: Cardiovascular;  Laterality: N/A;   CARDIAC CATHETERIZATION N/A 06/17/2016   Procedure: Intravascular Pressure Wire/FFR  Study;  Surgeon: Ozell Fell, MD;  Location: [facility] INVASIVE CV LAB;  Service: Cardiovascular;  Laterality: N/A;   CARDIOVASCULAR STRESS TEST  05/26/2012   EKG negative for ischemia, no ECG changes, no significant ischemia noted   I & D KNEE WITH POLY EXCHANGE Bilateral 01/2011   & complete synovectomy of 3 compartments of the right total knee/notes 02/04/2011 (replacement,partial knee replacement because of bacterial infection)   JOINT REPLACEMENT     KNEE ARTHROSCOPY Right ~ 2002   LEFT HEART CATH AND CORONARY ANGIOGRAPHY N/A 04/28/2023   Procedure: LEFT HEART CATH AND CORONARY ANGIOGRAPHY;  Surgeon: Anner Alm ORN, MD;  Location: [facility] INVASIVE CV LAB;  Service: Cardiovascular;  Laterality: N/A;   LUMBAR DISC SURGERY  ~ 1980; 2000   NASAL SINUS SURGERY  08/2011   SHOULDER ARTHROSCOPY W/ ROTATOR CUFF REPAIR Right 01/2015   TOTAL KNEE ARTHROPLASTY Bilateral 03/26/2004-09/15/2004    right-left   TRANSTHORACIC ECHOCARDIOGRAM  05/26/2012   EF >55%, mild concentric LVH   Patient Active Problem List   Diagnosis Date Noted   Dysphagia 12/18/2024   Hoarseness 12/18/2024   Constipation 06/29/2022   Other specified abnormal findings of blood chemistry 06/29/2022   Severe persistent asthma with acute exacerbation (HCC) 11/25/2020   Shortness of breath 11/25/2020   Viral upper respiratory infection 11/25/2020   Asthma, severe persistent, well-controlled (HCC) 11/09/2018  Other allergic rhinitis 11/09/2018   LPRD (laryngopharyngeal reflux disease) 11/09/2018   Stomatitis and mucositis 11/09/2018   Tinnitus of both ears 09/05/2018   Visual disturbance 09/01/2018   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   Sensorineural hearing loss (SNHL) of right ear 07/11/2018   Pansinusitis 01/06/2017   Hyperlipidemia LDL goal <70 07/23/2016   Anginal chest pain at rest 06/17/2016   Stented coronary artery 06/15/2016   Pain in the chest 05/28/2016   Sinus bradycardia 05/15/2016   Unstable angina (HCC) 05/14/2016    CAD (coronary artery disease), native coronary artery 05/14/2016   Adjustment insomnia 02/28/2016   Gilbert's syndrome 02/28/2016   Hematospermia 02/28/2016   Relative erythrocytosis 02/28/2016   Dyslipidemia 09/28/2013   GERD (gastroesophageal reflux disease) 09/28/2013   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    THERAPY DIAG:  Chronic left shoulder pain  Stiffness of left shoulder, not elsewhere classified  Rationale for Evaluation and Treatment: Rehabilitation  ONSET DATE: Surgery date (11/15/24).    SUBJECTIVE:   SUBJECTIVE STATEMENT: Doing good.  Doctor was pleased.    PERTINENT HISTORY: See above.   PAIN:  Are you having pain? No  PRECAUTIONS: Other: Progress per protocol.  No ultrasound.    RED FLAGS: None   WEIGHT BEARING RESTRICTIONS: No left UE weight bearing.    FALLS:  Has patient fallen in last 6 months? Couple times.  LIVING ENVIRONMENT: Lives with: lives with their spouse Lives in: House/apartment Has following equipment at home: None  OCCUPATION: Retired.    PLOF: Independent  PATIENT GOALS: Use left UE without pain.    OBJECTIVE:   PATIENT SURVEYS:  QUICKDASH:  46 points (79.55%).     PALPATION: Ecchymosis noted.  Aquacel intact.    UPPER EXTREMITY ROM: In supine:  Gentle PROM into left shoulder flexion to 90 degrees with increased pain and ER to 15 degrees.                                                                                                                               TREATMENT DATE:    12/18/24:                                     EXERCISE LOG  Exercise Repetitions and Resistance Comments  Pulleys  5 minutes    Wall ladder 5 minutes    Ball on wall 5 minutes    UE ranger on wall 6  minutes        In supine:  PROM x 17  minutes f/b vasopneumatic x 15 minutes with pillow between left elbow and thorax.     12/15/24:  EXERCISE LOG  Exercise Repetitions and Resistance Comments  Pulleys 5 minutes   UE ranger on wall 6 minutes   Wall ladder 5 minutes   Ball on wall 6 minutes        In supine:  PROM x 16 minutes f/b vasopneumatic x 15 minutes with pillow between left elbow and thorax.     12/11/24:  P/AROM x 23 minutes into left shoulder flexion and ER and rhythmic stab for ER/IR as well as flexion vasopneumatic x 15 minutes with pillow between left elbow and thorax.     RT shldr   12/06/24:  Seated UE ranger x 7  mins  In supine:  P/AROM x 23 minutes into left shoulder flexion and ER and rhythmic stab for ER/IR as well as flexion vasopneumatic x 15 minutes with pillow between left elbow and thorax.     PATIENT EDUCATION:  Education details: See below.   Person educated: Patient Education method: Explanation, Demonstration, Tactile cues, Verbal cues, and Handouts Education comprehension: verbalized understanding, returned demonstration, verbal cues required, and tactile cues required  HOME EXERCISE PROGRAM: CANE CANE  [[id number]]  Seated Wand UE External Rotation -  Repeat 10 Repetitions, Hold 10 Seconds, Complete 2 Sets, Perform 3 Times a Day  Closed chain flexion stretch -  Repeat 10 Repetitions, Hold 10 Seconds, Complete 2 Sets, Perform 4 Times a Day  WAND EXTERNAL ROTATION - SUPINE ER -  Repeat 10 Repetitions, Hold 10 Seconds, Complete 2 Sets, Perform 4 Times a Day  ASSESSMENT:  CLINICAL IMPRESSION: See below.   OBJECTIVE IMPAIRMENTS: decreased activity tolerance, decreased ROM, and pain.   ACTIVITY LIMITATIONS: carrying, lifting, dressing, and reach over head  PARTICIPATION LIMITATIONS: meal prep, cleaning, laundry, and yard work  PERSONAL FACTORS: Time since onset of injury/illness/exacerbation are also affecting patient's functional outcome.    REHAB POTENTIAL: Excellent  CLINICAL DECISION MAKING: Stable/uncomplicated  EVALUATION COMPLEXITY: Low   GOALS:   LONG TERM GOALS: Target date: 02/09/25  Ind with a HEP.  Goal status: INITIAL  2.  Active left shoulder flexion to 145 degrees so the patient can easily reach overhead.  Goal status: INITIAL  3.  Active ER to 60 degrees+ to allow for easily donning/doffing of apparel.  Goal status: INITIAL  4.  Perform ADL's with pain not > 3/10.  Goal status: INITIAL  5.  Improve QUICKDASH score by at least 15 points.  Goal status: INITIAL  PLAN:  PT FREQUENCY: 2x/week  PT DURATION: 6 weeks  PLANNED INTERVENTIONS: 97110-Therapeutic exercises, 97530- Therapeutic activity, [id number]- Neuromuscular re-education, 97535- Self Care, 02859- Manual therapy, G0283- Electrical stimulation (unattended), 97016- Vasopneumatic device, Patient/Family education, and Cryotherapy  PLAN FOR NEXT SESSION: Progress per protocol.  Vasopneumatic with pillow between thorax and elbow.    Progress Note Reporting Period 11/17/24 to 12/18/24  See note below for Objective Data and Assessment of Progress/Goals. Patient is making excellent progress and tolerating new interventions without complaint.      Kahlan Engebretson, PT 12/18/2024, 4:17 PM  "

## 2024-12-21 ENCOUNTER — Ambulatory Visit: Admitting: Physical Therapy

## 2024-12-21 DIAGNOSIS — M25612 Stiffness of left shoulder, not elsewhere classified: Secondary | ICD-10-CM

## 2024-12-21 DIAGNOSIS — M25512 Pain in left shoulder: Secondary | ICD-10-CM | POA: Diagnosis not present

## 2024-12-21 DIAGNOSIS — M5459 Other low back pain: Secondary | ICD-10-CM

## 2024-12-21 DIAGNOSIS — G8929 Other chronic pain: Secondary | ICD-10-CM

## 2024-12-21 NOTE — Therapy (Signed)
 " OUTPATIENT PHYSICAL THERAPY UPPER EXTREMITY TREATMENT   Patient Name: Louis Perez MRN: [id number] DOB:12-13-1951, 74 y.o., male Today's Date: 12/21/2024  END OF SESSION:  PT End of Session - 12/21/24 0850     Visit Number 11    Number of Visits 12    Date for Recertification  12/29/24    PT Start Time 0848    PT Stop Time 0949    PT Time Calculation (min) 61 min    Activity Tolerance Patient tolerated treatment well    Behavior During Therapy [facility] for tasks assessed/performed            Past Medical History:  Diagnosis Date   Arthritis    hips, lower back, knees, shoulders, hands (05/14/2016)   Asthma dx'd in 2014   Bacterial septicemia (HCC) 01/2011   both knee involved   CAD (coronary artery disease)    a. NSTEMI 02/2016 s/p DES to LAD. b. Relook cath due to angina/prior high risk anatomy 05/14/16 -> continued medical therapy given concern that a stent strut could inferere with Cx itself.   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    NSTEMI (non-ST elevated myocardial infarction) (HCC) 02/2016   thelbert 03/02/2016   Recurrent upper respiratory infection (URI)    Sinus bradycardia    Unspecified tinnitus    right   Past Surgical History:  Procedure Laterality Date   BACK SURGERY     CARDIAC CATHETERIZATION N/A 03/01/2016   Procedure: Left Heart Cath and Coronary Angiography;  Surgeon: Ozell Fell, MD;  oLAD 95%, CFX/RI/RCA/RPDA 25-30%   CARDIAC CATHETERIZATION N/A 03/01/2016   Procedure: Coronary Stent Intervention;  Surgeon: Ozell Fell, MD;  Promus Premier 4.0 x  12 mm DES to oLAD   CARDIAC CATHETERIZATION N/A 05/14/2016   Procedure: Left Heart Cath and Coronary Angiography;  Surgeon: Debby DELENA Sor, MD;  Location: [facility] INVASIVE CV LAB;  Service: Cardiovascular;  Laterality: N/A;   CARDIAC CATHETERIZATION N/A 06/17/2016   Procedure: Intravascular Pressure Wire/FFR  Study;  Surgeon: Ozell Fell, MD;  Location: [facility] INVASIVE CV LAB;  Service: Cardiovascular;  Laterality: N/A;   CARDIOVASCULAR STRESS TEST  05/26/2012   EKG negative for ischemia, no ECG changes, no significant ischemia noted   I & D KNEE WITH POLY EXCHANGE Bilateral 01/2011   & complete synovectomy of 3 compartments of the right total knee/notes 02/04/2011 (replacement,partial knee replacement because of bacterial infection)   JOINT REPLACEMENT     KNEE ARTHROSCOPY Right ~ 2002   LEFT HEART CATH AND CORONARY ANGIOGRAPHY N/A 04/28/2023   Procedure: LEFT HEART CATH AND CORONARY ANGIOGRAPHY;  Surgeon: Anner Alm ORN, MD;  Location: [facility] INVASIVE CV LAB;  Service: Cardiovascular;  Laterality: N/A;   LUMBAR DISC SURGERY  ~ 1980; 2000   NASAL SINUS SURGERY  08/2011   SHOULDER ARTHROSCOPY W/ ROTATOR CUFF REPAIR Right 01/2015   TOTAL KNEE ARTHROPLASTY Bilateral 03/26/2004-09/15/2004    right-left   TRANSTHORACIC ECHOCARDIOGRAM  05/26/2012   EF >55%, mild concentric LVH   Patient Active Problem List   Diagnosis Date Noted   Dysphagia 12/18/2024   Hoarseness 12/18/2024   Constipation 06/29/2022   Other specified abnormal findings of blood chemistry 06/29/2022   Severe persistent asthma with acute exacerbation (HCC) 11/25/2020   Shortness of breath 11/25/2020   Viral upper respiratory infection 11/25/2020   Asthma, severe persistent, well-controlled (HCC) 11/09/2018  Other allergic rhinitis 11/09/2018   LPRD (laryngopharyngeal reflux disease) 11/09/2018   Stomatitis and mucositis 11/09/2018   Tinnitus of both ears 09/05/2018   Visual disturbance 09/01/2018   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   Sensorineural hearing loss (SNHL) of right ear 07/11/2018   Pansinusitis 01/06/2017   Hyperlipidemia LDL goal <70 07/23/2016   Anginal chest pain at rest 06/17/2016   Stented coronary artery 06/15/2016   Pain in the chest 05/28/2016   Sinus bradycardia 05/15/2016   Unstable angina (HCC) 05/14/2016    CAD (coronary artery disease), native coronary artery 05/14/2016   Adjustment insomnia 02/28/2016   Gilbert's syndrome 02/28/2016   Hematospermia 02/28/2016   Relative erythrocytosis 02/28/2016   Dyslipidemia 09/28/2013   GERD (gastroesophageal reflux disease) 09/28/2013   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    THERAPY DIAG:  Chronic left shoulder pain  Stiffness of left shoulder, not elsewhere classified  Other low back pain  Rationale for Evaluation and Treatment: Rehabilitation  ONSET DATE: Surgery date (11/15/24).    SUBJECTIVE:   SUBJECTIVE STATEMENT: Doing good. Little sore.    PERTINENT HISTORY: See above.   PAIN:  Are you having pain? No  PRECAUTIONS: Other: Progress per protocol.  No ultrasound.    RED FLAGS: None   WEIGHT BEARING RESTRICTIONS: No left UE weight bearing.    FALLS:  Has patient fallen in last 6 months? Couple times.  LIVING ENVIRONMENT: Lives with: lives with their spouse Lives in: House/apartment Has following equipment at home: None  OCCUPATION: Retired.    PLOF: Independent  PATIENT GOALS: Use left UE without pain.    OBJECTIVE:   PATIENT SURVEYS:  QUICKDASH:  46 points (79.55%).     PALPATION: Ecchymosis noted.  Aquacel intact.    UPPER EXTREMITY ROM: In supine:  Gentle PROM into left shoulder flexion to 90 degrees with increased pain and ER to 15 degrees.                                                                                                                               TREATMENT DATE:    12/21/24:                                       EXERCISE LOG  Exercise Repetitions and Resistance Comments  Pulleys 5 minutes    Wall ladder 6 minutes   Swiss ball on wall 5 minutes    UE Ranger on wall Multi-directional x 6   minutes        In supine:  PROM x 16 minutes f/b vasopneumatic x 15 minutes with pillow between left elbow and thorax.    12/18/24:  EXERCISE LOG  Exercise Repetitions and Resistance Comments  Pulleys  5 minutes    Wall ladder 5 minutes    Ball on wall 5 minutes    UE ranger on wall 6  minutes        In supine:  PROM x 17 minutes f/b vasopneumatic x 15 minutes with pillow between left elbow and thorax.     12/15/24:                                       EXERCISE LOG  Exercise Repetitions and Resistance Comments  Pulleys 5 minutes   UE ranger on wall 6 minutes   Wall ladder 5 minutes   Ball on wall 6 minutes        In supine:  PROM x 16 minutes f/b vasopneumatic x 15 minutes with pillow between left elbow and thorax.     PATIENT EDUCATION:  Education details: See below.   Person educated: Patient Education method: Explanation, Demonstration, Tactile cues, Verbal cues, and Handouts Education comprehension: verbalized understanding, returned demonstration, verbal cues required, and tactile cues required  HOME EXERCISE PROGRAM: CANE CANE  [[id number]]  Seated Wand UE External Rotation -  Repeat 10 Repetitions, Hold 10 Seconds, Complete 2 Sets, Perform 3 Times a Day  Closed chain flexion stretch -  Repeat 10 Repetitions, Hold 10 Seconds, Complete 2 Sets, Perform 4 Times a Day  WAND EXTERNAL ROTATION - SUPINE ER -  Repeat 10 Repetitions, Hold 10 Seconds, Complete 2 Sets, Perform 4 Times a Day  ASSESSMENT:  CLINICAL IMPRESSION: Patient is making excellent progress toward goals.  He is highly motivated.     OBJECTIVE IMPAIRMENTS: decreased activity tolerance, decreased ROM, and pain.   ACTIVITY LIMITATIONS: carrying, lifting, dressing, and reach over head  PARTICIPATION LIMITATIONS: meal prep, cleaning, laundry, and yard work  PERSONAL FACTORS: Time since onset of injury/illness/exacerbation are also affecting patient's functional  outcome.   REHAB POTENTIAL: Excellent  CLINICAL DECISION MAKING: Stable/uncomplicated  EVALUATION COMPLEXITY: Low   GOALS:   LONG TERM GOALS: Target date: 02/09/25  Ind with a HEP.  Goal status: INITIAL  2.  Active left shoulder flexion to 145 degrees so the patient can easily reach overhead.  Goal status: INITIAL  3.  Active ER to 60 degrees+ to allow for easily donning/doffing of apparel.  Goal status: INITIAL  4.  Perform ADL's with pain not > 3/10.  Goal status: INITIAL  5.  Improve QUICKDASH score by at least 15 points.  Goal status: INITIAL  PLAN:  PT FREQUENCY: 2x/week  PT DURATION: 6 weeks  PLANNED INTERVENTIONS: 97110-Therapeutic exercises, 97530- Therapeutic activity, [id number]- Neuromuscular re-education, 97535- Self Care, 02859- Manual therapy, G0283- Electrical stimulation (unattended), 97016- Vasopneumatic device, Patient/Family education, and Cryotherapy  PLAN FOR NEXT SESSION: Progress per protocol.  Vasopneumatic with pillow between thorax and elbow.       Hollis Oh, PT 12/21/2024, 9:55 AM  "

## 2024-12-25 ENCOUNTER — Ambulatory Visit

## 2024-12-25 DIAGNOSIS — M25612 Stiffness of left shoulder, not elsewhere classified: Secondary | ICD-10-CM

## 2024-12-25 DIAGNOSIS — M25512 Pain in left shoulder: Secondary | ICD-10-CM | POA: Diagnosis not present

## 2024-12-25 DIAGNOSIS — G8929 Other chronic pain: Secondary | ICD-10-CM

## 2024-12-25 NOTE — Therapy (Signed)
 " OUTPATIENT PHYSICAL THERAPY UPPER EXTREMITY TREATMENT   Patient Name: Louis Perez MRN: [id number] DOB:August 22, 1951, 74 y.o., male Today's Date: 12/25/2024  END OF SESSION:  PT End of Session - 12/25/24 0802     Visit Number 12    Number of Visits 12    Date for Recertification  12/29/24    PT Start Time 0800    PT Stop Time 0855    PT Time Calculation (min) 55 min    Activity Tolerance Patient tolerated treatment well    Behavior During Therapy [facility] for tasks assessed/performed            Past Medical History:  Diagnosis Date   Arthritis    hips, lower back, knees, shoulders, hands (05/14/2016)   Asthma dx'd in 2014   Bacterial septicemia (HCC) 01/2011   both knee involved   CAD (coronary artery disease)    a. NSTEMI 02/2016 s/p DES to LAD. b. Relook cath due to angina/prior high risk anatomy 05/14/16 -> continued medical therapy given concern that a stent strut could inferere with Cx itself.   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    NSTEMI (non-ST elevated myocardial infarction) (HCC) 02/2016   thelbert 03/02/2016   Recurrent upper respiratory infection (URI)    Sinus bradycardia    Unspecified tinnitus    right   Past Surgical History:  Procedure Laterality Date   BACK SURGERY     CARDIAC CATHETERIZATION N/A 03/01/2016   Procedure: Left Heart Cath and Coronary Angiography;  Surgeon: Ozell Fell, MD;  oLAD 95%, CFX/RI/RCA/RPDA 25-30%   CARDIAC CATHETERIZATION N/A 03/01/2016   Procedure: Coronary Stent Intervention;  Surgeon: Ozell Fell, MD;  Promus Premier 4.0 x  12 mm DES to oLAD   CARDIAC CATHETERIZATION N/A 05/14/2016   Procedure: Left Heart Cath and Coronary Angiography;  Surgeon: Debby DELENA Sor, MD;  Location: [facility] INVASIVE CV LAB;  Service: Cardiovascular;  Laterality: N/A;   CARDIAC CATHETERIZATION N/A 06/17/2016   Procedure: Intravascular Pressure Wire/FFR  Study;  Surgeon: Ozell Fell, MD;  Location: [facility] INVASIVE CV LAB;  Service: Cardiovascular;  Laterality: N/A;   CARDIOVASCULAR STRESS TEST  05/26/2012   EKG negative for ischemia, no ECG changes, no significant ischemia noted   I & D KNEE WITH POLY EXCHANGE Bilateral 01/2011   & complete synovectomy of 3 compartments of the right total knee/notes 02/04/2011 (replacement,partial knee replacement because of bacterial infection)   JOINT REPLACEMENT     KNEE ARTHROSCOPY Right ~ 2002   LEFT HEART CATH AND CORONARY ANGIOGRAPHY N/A 04/28/2023   Procedure: LEFT HEART CATH AND CORONARY ANGIOGRAPHY;  Surgeon: Anner Alm ORN, MD;  Location: [facility] INVASIVE CV LAB;  Service: Cardiovascular;  Laterality: N/A;   LUMBAR DISC SURGERY  ~ 1980; 2000   NASAL SINUS SURGERY  08/2011   SHOULDER ARTHROSCOPY W/ ROTATOR CUFF REPAIR Right 01/2015   TOTAL KNEE ARTHROPLASTY Bilateral 03/26/2004-09/15/2004    right-left   TRANSTHORACIC ECHOCARDIOGRAM  05/26/2012   EF >55%, mild concentric LVH   Patient Active Problem List   Diagnosis Date Noted   Dysphagia 12/18/2024   Hoarseness 12/18/2024   Constipation 06/29/2022   Other specified abnormal findings of blood chemistry 06/29/2022   Severe persistent asthma with acute exacerbation (HCC) 11/25/2020   Shortness of breath 11/25/2020   Viral upper respiratory infection 11/25/2020   Asthma, severe persistent, well-controlled (HCC) 11/09/2018  Other allergic rhinitis 11/09/2018   LPRD (laryngopharyngeal reflux disease) 11/09/2018   Stomatitis and mucositis 11/09/2018   Tinnitus of both ears 09/05/2018   Visual disturbance 09/01/2018   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   Sensorineural hearing loss (SNHL) of right ear 07/11/2018   Pansinusitis 01/06/2017   Hyperlipidemia LDL goal <70 07/23/2016   Anginal chest pain at rest 06/17/2016   Stented coronary artery 06/15/2016   Pain in the chest 05/28/2016   Sinus bradycardia 05/15/2016   Unstable angina (HCC) 05/14/2016    CAD (coronary artery disease), native coronary artery 05/14/2016   Adjustment insomnia 02/28/2016   Gilbert's syndrome 02/28/2016   Hematospermia 02/28/2016   Relative erythrocytosis 02/28/2016   Dyslipidemia 09/28/2013   GERD (gastroesophageal reflux disease) 09/28/2013   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    THERAPY DIAG:  Chronic left shoulder pain  Stiffness of left shoulder, not elsewhere classified  Rationale for Evaluation and Treatment: Rehabilitation  ONSET DATE: Surgery date (11/15/24).    SUBJECTIVE:   SUBJECTIVE STATEMENT: Pt reports 1-2/10 left shoulder discomfort.    PERTINENT HISTORY: See above.   PAIN:  Are you having pain? Yes: NPRS scale: 1-2/10 Pain location: left shoulder  PRECAUTIONS: Other: Progress per protocol.  No ultrasound.    RED FLAGS: None   WEIGHT BEARING RESTRICTIONS: No left UE weight bearing.    FALLS:  Has patient fallen in last 6 months? Couple times.  LIVING ENVIRONMENT: Lives with: lives with their spouse Lives in: House/apartment Has following equipment at home: None  OCCUPATION: Retired.    PLOF: Independent  PATIENT GOALS: Use left UE without pain.    OBJECTIVE:   PATIENT SURVEYS:  QUICKDASH:  46 points (79.55%).     PALPATION: Ecchymosis noted.  Aquacel intact.    UPPER EXTREMITY ROM: In supine:  Gentle PROM into left shoulder flexion to 90 degrees with increased pain and ER to 15 degrees.                                                                                                                               TREATMENT DATE:    12/25/24:                                    EXERCISE LOG  Exercise Repetitions and Resistance Comments  Pulleys 8 minutes    Wall ladder    Swiss ball on wall 5 minutes    UE  Ranger on wall Multi-directional x 6  minutes    Cone to Shelf 2# x 25 reps    Wall Push Ups 25 reps   Rows Red 2 sets of 15 reps   Extensions Red 2 sets of 15 reps   Biceps  3-way 4# x 25 reps each way   Vasopneumatic x 15 minutes with pillow between left elbow and thorax.    12/18/24:                                   EXERCISE LOG  Exercise Repetitions and Resistance Comments  Pulleys  5 minutes    Wall ladder 5 minutes    Ball on wall 5 minutes    UE ranger on wall 6  minutes        In supine:  PROM x 17 minutes f/b vasopneumatic x 15 minutes with pillow between left elbow and thorax.     12/15/24:                                     EXERCISE LOG  Exercise Repetitions and Resistance Comments  Pulleys 5 minutes   UE ranger on wall 6 minutes   Wall ladder 5 minutes   Ball on wall 6 minutes        In supine:  PROM x 16 minutes f/b vasopneumatic x 15 minutes with pillow between left elbow and thorax.     PATIENT EDUCATION:  Education details: See below.   Person educated: Patient Education method: Explanation, Demonstration, Tactile cues, Verbal cues, and Handouts Education comprehension: verbalized understanding, returned demonstration, verbal cues required, and tactile cues required  HOME EXERCISE PROGRAM: CANE CANE  [[id number]]  Seated Wand UE External Rotation -  Repeat 10 Repetitions, Hold 10 Seconds, Complete 2 Sets, Perform 3 Times a Day  Closed chain flexion stretch -  Repeat 10 Repetitions, Hold 10 Seconds, Complete 2 Sets, Perform 4 Times a Day  WAND EXTERNAL ROTATION - SUPINE ER -  Repeat 10 Repetitions, Hold 10 Seconds, Complete 2 Sets, Perform 4 Times a Day  ASSESSMENT:  CLINICAL IMPRESSION: Pt arrives for today's treatment session reporting 1-2/10 left shoulder pain.  Pt able to demonstrate 147 degrees of left shoulder flexion and 69 degrees of left shoulder ER meeting his ROM goals.  Pt able to decrease QuickDash score to 22.7% well surpassing his goal.  Pt  introduced to several new strengthening exercises today with min cues for proper technique and posture.  Normal responses to vaso noted upon removal.  Pt denied any pain at completion of today's treatment session.  OBJECTIVE IMPAIRMENTS: decreased activity tolerance, decreased ROM, and pain.   ACTIVITY LIMITATIONS: carrying, lifting, dressing, and reach over head  PARTICIPATION LIMITATIONS: meal prep, cleaning, laundry, and yard work  PERSONAL FACTORS: Time since onset of injury/illness/exacerbation are also affecting patient's functional outcome.   REHAB POTENTIAL: Excellent  CLINICAL DECISION MAKING: Stable/uncomplicated  EVALUATION COMPLEXITY: Low   GOALS:   LONG TERM GOALS: Target date: 02/09/25  Ind with a HEP.  Goal status: IN PROGRESS  2.  Active left shoulder flexion to 145 degrees so the patient can easily reach overhead.   1/12: 147 degrees Goal status: MET  3.  Active ER to 60 degrees+ to allow for easily donning/doffing of apparel.   1/12: 69 degrees Goal status: MET  4.  Perform ADL's with pain not > 3/10.   1/12: 4-5/10  Goal status: IN PROGRESS  5.  Improve QUICKDASH score by at least 15 points.   1/12: 22.7% Goal status: MET  PLAN:  PT FREQUENCY: 2x/week  PT DURATION: 6 weeks  PLANNED INTERVENTIONS: 97110-Therapeutic exercises, 97530- Therapeutic activity, [id number]- Neuromuscular re-education, 97535- Self Care, 02859- Manual therapy, G0283- Electrical stimulation (unattended), 97016- Vasopneumatic device, Patient/Family education, and Cryotherapy  PLAN FOR NEXT SESSION: Progress per protocol.  Vasopneumatic with pillow between thorax and elbow.       Delon DELENA Gosling, PTA 12/25/2024, 8:56 AM  "

## 2024-12-27 NOTE — Addendum Note (Signed)
 Addended by: Braxxton Stoudt W on: 12/27/2024 10:22 AM   Modules accepted: Orders

## 2024-12-28 ENCOUNTER — Ambulatory Visit

## 2024-12-28 DIAGNOSIS — M5459 Other low back pain: Secondary | ICD-10-CM

## 2024-12-28 DIAGNOSIS — M25512 Pain in left shoulder: Secondary | ICD-10-CM | POA: Diagnosis not present

## 2024-12-28 DIAGNOSIS — G8929 Other chronic pain: Secondary | ICD-10-CM

## 2024-12-28 DIAGNOSIS — M25612 Stiffness of left shoulder, not elsewhere classified: Secondary | ICD-10-CM

## 2024-12-28 NOTE — Therapy (Signed)
 " OUTPATIENT PHYSICAL THERAPY UPPER EXTREMITY TREATMENT   Patient Name: Louis Perez MRN: [id number] DOB:April 04, 1951, 74 y.o., male Today's Date: 12/28/2024  END OF SESSION:  PT End of Session - 12/28/24 0801     Visit Number 13    Number of Visits 20    Date for Recertification  01/26/25    PT Start Time 0801    PT Stop Time 0857    PT Time Calculation (min) 56 min    Activity Tolerance Patient tolerated treatment well    Behavior During Therapy [facility] for tasks assessed/performed            Past Medical History:  Diagnosis Date   Arthritis    hips, lower back, knees, shoulders, hands (05/14/2016)   Asthma dx'd in 2014   Bacterial septicemia (HCC) 01/2011   both knee involved   CAD (coronary artery disease)    a. NSTEMI 02/2016 s/p DES to LAD. b. Relook cath due to angina/prior high risk anatomy 05/14/16 -> continued medical therapy given concern that a stent strut could inferere with Cx itself.   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    NSTEMI (non-ST elevated myocardial infarction) (HCC) 02/2016   thelbert 03/02/2016   Recurrent upper respiratory infection (URI)    Sinus bradycardia    Unspecified tinnitus    right   Past Surgical History:  Procedure Laterality Date   BACK SURGERY     CARDIAC CATHETERIZATION N/A 03/01/2016   Procedure: Left Heart Cath and Coronary Angiography;  Surgeon: Ozell Fell, MD;  oLAD 95%, CFX/RI/RCA/RPDA 25-30%   CARDIAC CATHETERIZATION N/A 03/01/2016   Procedure: Coronary Stent Intervention;  Surgeon: Ozell Fell, MD;  Promus Premier 4.0 x  12 mm DES to oLAD   CARDIAC CATHETERIZATION N/A 05/14/2016   Procedure: Left Heart Cath and Coronary Angiography;  Surgeon: Debby DELENA Sor, MD;  Location: [facility] INVASIVE CV LAB;  Service: Cardiovascular;  Laterality: N/A;   CARDIAC CATHETERIZATION N/A 06/17/2016   Procedure: Intravascular Pressure Wire/FFR  Study;  Surgeon: Ozell Fell, MD;  Location: [facility] INVASIVE CV LAB;  Service: Cardiovascular;  Laterality: N/A;   CARDIOVASCULAR STRESS TEST  05/26/2012   EKG negative for ischemia, no ECG changes, no significant ischemia noted   I & D KNEE WITH POLY EXCHANGE Bilateral 01/2011   & complete synovectomy of 3 compartments of the right total knee/notes 02/04/2011 (replacement,partial knee replacement because of bacterial infection)   JOINT REPLACEMENT     KNEE ARTHROSCOPY Right ~ 2002   LEFT HEART CATH AND CORONARY ANGIOGRAPHY N/A 04/28/2023   Procedure: LEFT HEART CATH AND CORONARY ANGIOGRAPHY;  Surgeon: Anner Alm ORN, MD;  Location: [facility] INVASIVE CV LAB;  Service: Cardiovascular;  Laterality: N/A;   LUMBAR DISC SURGERY  ~ 1980; 2000   NASAL SINUS SURGERY  08/2011   SHOULDER ARTHROSCOPY W/ ROTATOR CUFF REPAIR Right 01/2015   TOTAL KNEE ARTHROPLASTY Bilateral 03/26/2004-09/15/2004    right-left   TRANSTHORACIC ECHOCARDIOGRAM  05/26/2012   EF >55%, mild concentric LVH   Patient Active Problem List   Diagnosis Date Noted   Dysphagia 12/18/2024   Hoarseness 12/18/2024   Constipation 06/29/2022   Other specified abnormal findings of blood chemistry 06/29/2022   Severe persistent asthma with acute exacerbation (HCC) 11/25/2020   Shortness of breath 11/25/2020   Viral upper respiratory infection 11/25/2020   Asthma, severe persistent, well-controlled (HCC) 11/09/2018  Other allergic rhinitis 11/09/2018   LPRD (laryngopharyngeal reflux disease) 11/09/2018   Stomatitis and mucositis 11/09/2018   Tinnitus of both ears 09/05/2018   Visual disturbance 09/01/2018   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   Sensorineural hearing loss (SNHL) of right ear 07/11/2018   Pansinusitis 01/06/2017   Hyperlipidemia LDL goal <70 07/23/2016   Anginal chest pain at rest 06/17/2016   Stented coronary artery 06/15/2016   Pain in the chest 05/28/2016   Sinus bradycardia 05/15/2016   Unstable angina (HCC) 05/14/2016    CAD (coronary artery disease), native coronary artery 05/14/2016   Adjustment insomnia 02/28/2016   Gilbert's syndrome 02/28/2016   Hematospermia 02/28/2016   Relative erythrocytosis 02/28/2016   Dyslipidemia 09/28/2013   GERD (gastroesophageal reflux disease) 09/28/2013   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    THERAPY DIAG:  Chronic left shoulder pain  Stiffness of left shoulder, not elsewhere classified  Other low back pain  Rationale for Evaluation and Treatment: Rehabilitation  ONSET DATE: Surgery date (11/15/24).    SUBJECTIVE:   SUBJECTIVE STATEMENT: Pt reports 1-2/10 left shoulder discomfort.    PERTINENT HISTORY: See above.   PAIN:  Are you having pain? Yes: NPRS scale: 1-2/10 Pain location: left shoulder  PRECAUTIONS: Other: Progress per protocol.  No ultrasound.    RED FLAGS: None   WEIGHT BEARING RESTRICTIONS: No left UE weight bearing.    FALLS:  Has patient fallen in last 6 months? Couple times.  LIVING ENVIRONMENT: Lives with: lives with their spouse Lives in: House/apartment Has following equipment at home: None  OCCUPATION: Retired.    PLOF: Independent  PATIENT GOALS: Use left UE without pain.    OBJECTIVE:   PATIENT SURVEYS:  QUICKDASH:  46 points (79.55%).     PALPATION: Ecchymosis noted.  Aquacel intact.    UPPER EXTREMITY ROM: In supine:  Gentle PROM into left shoulder flexion to 90 degrees with increased pain and ER to 15 degrees.                                                                                                                               TREATMENT DATE:    12/28/24:                                    EXERCISE LOG  Exercise Repetitions and Resistance Comments  UBE 6 mins 120 RPM (forward/backward)   Pulleys 8  minutes    Wall ladder    Swiss ball on wall 5 minutes    UE Ranger on wall Multi-directional x 6  minutes    Cone to Shelf 3# x 20 reps 2nd shelf; 3# x 10 reps 3rd   Wall Push Ups 25 reps  Rows Red 2 sets of 20 reps   Extensions Red 2 sets of 20 reps   ER Red x 25 reps   Biceps 3-way 4# x 25 reps each way   Vasopneumatic x 15 minutes with pillow between left elbow and thorax.    12/18/24:                                   EXERCISE LOG  Exercise Repetitions and Resistance Comments  Pulleys  5 minutes    Wall ladder 5 minutes    Ball on wall 5 minutes    UE ranger on wall 6  minutes        In supine:  PROM x 17 minutes f/b vasopneumatic x 15 minutes with pillow between left elbow and thorax.     12/15/24:                                     EXERCISE LOG  Exercise Repetitions and Resistance Comments  Pulleys 5 minutes   UE ranger on wall 6 minutes   Wall ladder 5 minutes   Ball on wall 6 minutes        In supine:  PROM x 16 minutes f/b vasopneumatic x 15 minutes with pillow between left elbow and thorax.     PATIENT EDUCATION:  Education details: See below.   Person educated: Patient Education method: Explanation, Demonstration, Tactile cues, Verbal cues, and Handouts Education comprehension: verbalized understanding, returned demonstration, verbal cues required, and tactile cues required  HOME EXERCISE PROGRAM: CANE CANE  [[id number]]  Seated Wand UE External Rotation -  Repeat 10 Repetitions, Hold 10 Seconds, Complete 2 Sets, Perform 3 Times a Day  Closed chain flexion stretch -  Repeat 10 Repetitions, Hold 10 Seconds, Complete 2 Sets, Perform 4 Times a Day  WAND EXTERNAL ROTATION - SUPINE ER -  Repeat 10 Repetitions, Hold 10 Seconds, Complete 2 Sets, Perform 4 Times a Day  ASSESSMENT:  CLINICAL IMPRESSION: Pt arrives for today's treatment session denying any pain.  Pt able to tolerate increased weight with functional shelf exercises today.  Pt also able to tolerate  increased time or reps with various other exercises.  Normal responses to vaso noted upon removal.  Pt denied any pain at completion of today's treatment session.   OBJECTIVE IMPAIRMENTS: decreased activity tolerance, decreased ROM, and pain.   ACTIVITY LIMITATIONS: carrying, lifting, dressing, and reach over head  PARTICIPATION LIMITATIONS: meal prep, cleaning, laundry, and yard work  PERSONAL FACTORS: Time since onset of injury/illness/exacerbation are also affecting patient's functional outcome.   REHAB POTENTIAL: Excellent  CLINICAL DECISION MAKING: Stable/uncomplicated  EVALUATION COMPLEXITY: Low   GOALS:   LONG TERM GOALS: Target date: 02/09/25  Ind with a HEP.  Goal status: IN PROGRESS  2.  Active left shoulder flexion to 145 degrees so the patient can easily reach overhead.   1/12: 147 degrees Goal status: MET  3.  Active ER to 60 degrees+ to allow for easily donning/doffing of apparel.   1/12: 69 degrees Goal status: MET  4.  Perform ADL's with pain not > 3/10.   1/12: 4-5/10  Goal status: IN PROGRESS  5.  Improve QUICKDASH score by at least 15 points.   1/12: 22.7% Goal status: MET  PLAN:  PT FREQUENCY: 2x/week  PT DURATION: 6 weeks  PLANNED  INTERVENTIONS: 97110-Therapeutic exercises, 97530- Therapeutic activity, [id number]- Neuromuscular re-education, 97535- Self Care, [id number]- Manual therapy, G0283- Electrical stimulation (unattended), 97016- Vasopneumatic device, Patient/Family education, and Cryotherapy  PLAN FOR NEXT SESSION: Progress per protocol.  Vasopneumatic with pillow between thorax and elbow.       Delon DELENA Gosling, PTA 12/28/2024, 9:46 AM  "

## 2024-12-29 ENCOUNTER — Ambulatory Visit (HOSPITAL_BASED_OUTPATIENT_CLINIC_OR_DEPARTMENT_OTHER): Admitting: Family Medicine

## 2024-12-29 VITALS — BP 174/114 | HR 73 | Temp 97.7°F | Resp 18 | Ht 75.0 in | Wt 212.0 lb

## 2024-12-29 DIAGNOSIS — R413 Other amnesia: Secondary | ICD-10-CM | POA: Insufficient documentation

## 2024-12-29 DIAGNOSIS — H9193 Unspecified hearing loss, bilateral: Secondary | ICD-10-CM

## 2024-12-29 DIAGNOSIS — I10 Essential (primary) hypertension: Secondary | ICD-10-CM

## 2024-12-29 DIAGNOSIS — H9313 Tinnitus, bilateral: Secondary | ICD-10-CM

## 2024-12-29 MED ORDER — ALBUTEROL SULFATE HFA 108 (90 BASE) MCG/ACT IN AERS: 2.0000 | INHALATION_SPRAY | RESPIRATORY_TRACT | 1 refills | Status: AC | PRN

## 2024-12-29 NOTE — Progress Notes (Unsigned)
 "  New Patient Office Visit  Subjective   Patient ID: Louis Perez, male    DOB: 02-16-1951  Age: 74 y.o. MRN: [id number]  CC:  Chief Complaint  Patient presents with   Establish Care    HPI Louis Perez presents to establish care Last PCP - Elouise Ferron  Discussed the use of AI scribe software for clinical note transcription with the patient, who gave verbal consent to proceed.  History of Present Illness   Patient is originally from [place]. Worked in patent examiner. He enjoys playing golf - has not recently due to shoulder pain/surgery.  Outpatient Encounter Medications as of 12/29/2024  Medication Sig   albuterol  (PROVENTIL ) (2.5 MG/3ML) 0.083% nebulizer solution Take 3 mLs (2.5 mg total) by nebulization every 4 (four) hours as needed for wheezing or shortness of breath.   albuterol  (VENTOLIN  HFA) 108 (90 Base) MCG/ACT inhaler Inhale 2 puffs into the lungs every 4 (four) hours as needed for wheezing or shortness of breath.   amLODipine  (NORVASC ) 2.5 MG tablet Take 1 tablet (2.5 mg total) by mouth daily.   aspirin  81 MG tablet Take 81 mg by mouth at bedtime.    Aspirin -Acetaminophen -Caffeine (GOODY HEADACHE PO) Take 1 packet by mouth daily as needed (pain).   budesonide -glycopyrrolate-formoterol  (BREZTRI  AEROSPHERE) 160-9-4.8 MCG/ACT AERO inhaler Inhale 2 puffs into the lungs in the morning and at bedtime.   carvedilol  (COREG ) 3.125 MG tablet Take 1 tablet (3.125 mg total) by mouth 2 (two) times daily.   cetirizine  (EQ ALLERGY  RELIEF, CETIRIZINE ,) 10 MG tablet TAKE 1 TABLET BY MOUTH ONCE DAILY AS NEEDED FOR ALLERGIES (CAN TAKE AN EXTRA DOSE DURING FLARE UPS)   clotrimazole  (MYCELEX ) 10 MG troche Take 1 tablet (10 mg total) by mouth in the morning and at bedtime. Take after each use of Breztri  (Patient taking differently: Take 10 mg by mouth as needed. Take after each use of Breztri )   EQ PAIN RELIEVER EX ST 500 MG tablet Take 1,000 mg by mouth every 8  (eight) hours.   famotidine  (PEPCID ) 40 MG tablet Take 1 tablet (40 mg total) by mouth daily as needed for heartburn or indigestion (Can take an etxra dose during flare ups.).   hydrALAZINE  (APRESOLINE ) 25 MG tablet Take 1 tablet (25 mg total) by mouth as needed (as needed for systolic blood pressure more than 160).   ipratropium (ATROVENT ) 0.06 % nasal spray Place 2 sprays into both nostrils 4 (four) times daily. 1-2 sprays each nostril every 6 hours to dry nose   isosorbide  mononitrate (IMDUR ) 60 MG 24 hr tablet Take 1 tablet (60 mg total) by mouth daily.   levocetirizine (XYZAL ) 5 MG tablet TAKE 1 TABLET BY MOUTH TWICE DAILY AS NEEDED CAN TAKE AN EXTRA DOSE DURING FLARE UPS. APPOINTMENT REQUIRED FOR FUTURE REFILLS   meloxicam (MOBIC) 7.5 MG tablet Take 7.5 mg by mouth 2 (two) times daily.   Nebulizer MISC 1 Device by Does not apply route as directed.   nitroGLYCERIN  (NITROSTAT ) 0.4 MG SL tablet Place 1 tablet (0.4 mg total) under the tongue every 5 (five) minutes as needed for chest pain.   olmesartan  (BENICAR ) 20 MG tablet Take 2 tablets (40 mg total) by mouth daily.   pantoprazole  (PROTONIX ) 40 MG tablet Take 40 mg by mouth 2 (two) times daily.   Spacer/Aero-Holding Chambers DEVI 1 Device by Does not apply route as directed.   Study - ORION 4 - inclisiran 300 mg/1.5mL or placebo SQ injection (PI-Stuckey) Inject 300 mg  into the skin every 6 (six) months. For Investigational Use Only. Please alert research coordinator of change/addition of any lipid lowering agent or lipid panels drawn per research protocol. ORION 4 STUDY MEDICATION - inclisiran 300 mg/1.5 mL or placebo SQ injection (PI-Stuckey)   sucralfate  (CARAFATE ) 1 g tablet TAKE 1 TABLET BY MOUTH IN THE MORNING, AT NOON, IN THE EVENING AND AT BEDTIME   Facility-Administered Encounter Medications as of 12/29/2024  Medication   Study - ORION 4 - inclisiran 300 mg/1.5mL or placebo SQ injection (PI-Stuckey)   Study - ORION 4 -  inclisiran 300 mg/1.5mL or placebo SQ injection (PI-Stuckey)   tezepelumab -ekko (TEZSPIRE ) 210 MG/1. syringe 210 mg    Past Medical History:  Diagnosis Date   Allergy     Arthritis    hips, lower back, knees, shoulders, hands (05/14/2016)   Asthma dx'd in 2014   Bacterial septicemia (HCC) 01/2011   both knee involved   CAD (coronary artery disease)    a. NSTEMI 02/2016 s/p DES to LAD. b. Relook cath due to angina/prior high risk anatomy 05/14/16 -> continued medical therapy given concern that a stent strut could inferere with Cx itself.   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    NSTEMI (non-ST elevated myocardial infarction) (HCC) 02/2016   thelbert 03/02/2016   Recurrent upper respiratory infection (URI)    Sinus bradycardia    Unspecified tinnitus    right    Past Surgical History:  Procedure Laterality Date   BACK SURGERY     CARDIAC CATHETERIZATION N/A 03/01/2016   Procedure: Left Heart Cath and Coronary Angiography;  Surgeon: Ozell Fell, MD;  oLAD 95%, CFX/RI/RCA/RPDA 25-30%   CARDIAC CATHETERIZATION N/A 03/01/2016   Procedure: Coronary Stent Intervention;  Surgeon: Ozell Fell, MD;  Promus Premier 4.0 x  12 mm DES to oLAD   CARDIAC CATHETERIZATION N/A 05/14/2016   Procedure: Left Heart Cath and Coronary Angiography;  Surgeon: Debby DELENA Sor, MD;  Location[facility] INVASIVE CV LAB;  Service: Cardiovascular;  Laterality: N/A;   CARDIAC CATHETERIZATION N/A 06/17/2016   Procedure: Intravascular Pressure Wire/FFR Study;  Surgeon: Ozell Fell, MD;  Location: [facility] INVASIVE CV LAB;  Service: Cardiovascular;  Laterality: N/A;   CARDIOVASCULAR STRESS TEST  05/26/2012   EKG negative for ischemia, no ECG changes, no significant ischemia noted   I & D KNEE WITH POLY EXCHANGE Bilateral 01/2011   & complete synovectomy of 3 compartments of the right total knee/notes  02/04/2011 (replacement,partial knee replacement because of bacterial infection)   JOINT REPLACEMENT     KNEE ARTHROSCOPY Right ~ 2002   LEFT HEART CATH AND CORONARY ANGIOGRAPHY N/A 04/28/2023   Procedure: LEFT HEART CATH AND CORONARY ANGIOGRAPHY;  Surgeon: Anner Alm ORN, MD;  Location: [facility] INVASIVE CV LAB;  Service: Cardiovascular;  Laterality: N/A;   LUMBAR DISC SURGERY  ~ 1980; 2000   NASAL SINUS SURGERY  08/2011   SHOULDER ARTHROSCOPY W/ ROTATOR CUFF REPAIR Right 01/2015   TOTAL KNEE ARTHROPLASTY Bilateral 03/26/2004-09/15/2004    right-left   TRANSTHORACIC ECHOCARDIOGRAM  05/26/2012   EF >55%, mild concentric LVH    Family History  Problem Relation Age of Onset   Dementia Father    Alzheimer's disease Father    Heart disease Mother        Onset-Late 56s   Heart attack Mother    Heart disease Brother 46   Diabetes Brother  Cancer Paternal Aunt    Cancer Paternal Uncle    Cancer Paternal Uncle    Cancer Paternal Uncle     Social History   Socioeconomic History   Marital status: Married    Spouse name: Not on file   Number of children: 2   Years of education: 14   Highest education level: Associate degree: academic program  Occupational History   Occupation: retired Architect, part-time security work  Tobacco Use   Smoking status: Former    Current packs/day: 0.00    Average packs/day: 1 pack/day for 5.0 years (5.0 ttl pk-yrs)    Types: Cigarettes    Start date: 12/15/1963    Quit date: 12/14/1968    Years since quitting: 56.0   Smokeless tobacco: Never  Vaping Use   Vaping status: Never Used  Substance and Sexual Activity   Alcohol use: Yes    Alcohol/week: 1.0 standard drink of alcohol    Types: 1 Cans of beer per week    Comment: Occasional   Drug use: Never   Sexual activity: Not Currently    Partners: Female    Comment: Married  Other Topics Concern   Not on file  Social History Narrative   Patient lives at home with his  wife . He is retired. Patient has 14 years of education. Patient has two children.   Caffeine - two cups daily.   Right-handed   Social Drivers of Health   Tobacco Use: Medium Risk (12/28/2024)   Patient History    Smoking Tobacco Use: Former    Smokeless Tobacco Use: Never    Passive Exposure: Not on file  Financial Resource Strain: High Risk (12/29/2024)   Overall Financial Resource Strain (CARDIA)    Difficulty of Paying Living Expenses: Very hard  Food Insecurity: No Food Insecurity (12/29/2024)   Epic    Worried About Programme Researcher, Broadcasting/film/video in the Last Year: Never true    Ran Out of Food in the Last Year: Never true  Transportation Needs: No Transportation Needs (12/29/2024)   Epic    Lack of Transportation (Medical): No    Lack of Transportation (Non-Medical): No  Physical Activity: Sufficiently Active (12/29/2024)   Exercise Vital Sign    Days of Exercise per Week: 3 days    Minutes of Exercise per Session: 60 min  Stress: Stress Concern Present (12/29/2024)   [employer] of Occupational Health - Occupational Stress Questionnaire    Feeling of Stress: To some extent  Social Connections: Unknown (12/29/2024)   Social Connection and Isolation Panel    Frequency of Communication with Friends and Family: More than three times a week    Frequency of Social Gatherings with Friends and Family: More than three times a week    Attends Religious Services: More than 4 times per year    Active Member of [employer] or Organizations: Not on file    Attends Banker Meetings: Not on file    Marital Status: Married  Intimate Partner Violence: Not on file  Depression (PHQ2-9): Low Risk (12/29/2024)   Depression (PHQ2-9)    PHQ-2 Score: 0  Alcohol Screen: Low Risk (12/29/2024)   Alcohol Screen    Last Alcohol Screening Score (AUDIT): 1  Housing: Low Risk (12/29/2024)   Epic    Unable to Pay for Housing in the Last Year: No    Number of Times Moved in the Last  Year: 0    Homeless in the Last Year: No  Utilities: Low Risk (01/19/2024)   Received from [facility]   Utilities    In the past 12 months has the electric, gas, oil, or water company threatened to shut off services in your home? : No  Health Literacy: Not on file    Objective   BP (!) 148/96 (BP Location: Left Arm, Patient Position: Sitting, Cuff Size: Normal)   Pulse 67   Temp 97.7 F (36.5 C) (Oral)   Resp 18   Ht 6' 3 (1.905 m)   Wt 212 lb (96.2 kg)   SpO2 97%   BMI 26.50 kg/m   Physical Exam  73  Assessment & Plan:   Essential hypertension  Memory changes  Tinnitus of both ears  Bilateral hearing loss, unspecified hearing loss type  No follow-ups on file.    ___________________________________________ Mckay Tegtmeyer de [place], MD, ABFM, CAQSM Primary Care and Sports Medicine [facility]   "

## 2025-01-01 ENCOUNTER — Encounter (HOSPITAL_BASED_OUTPATIENT_CLINIC_OR_DEPARTMENT_OTHER): Payer: Self-pay | Admitting: Family

## 2025-01-01 ENCOUNTER — Ambulatory Visit (INDEPENDENT_AMBULATORY_CARE_PROVIDER_SITE_OTHER): Admitting: Family

## 2025-01-01 DIAGNOSIS — I25118 Atherosclerotic heart disease of native coronary artery with other forms of angina pectoris: Secondary | ICD-10-CM

## 2025-01-01 DIAGNOSIS — E785 Hyperlipidemia, unspecified: Secondary | ICD-10-CM

## 2025-01-01 DIAGNOSIS — I10 Essential (primary) hypertension: Secondary | ICD-10-CM | POA: Diagnosis not present

## 2025-01-01 MED ORDER — AMLODIPINE BESYLATE 5 MG PO TABS: 5.0000 mg | ORAL_TABLET | Freq: Every day | ORAL | 2 refills | Status: AC

## 2025-01-01 MED ORDER — OLMESARTAN MEDOXOMIL 40 MG PO TABS: 40.0000 mg | ORAL_TABLET | Freq: Every day | ORAL | 1 refills | Status: AC

## 2025-01-01 NOTE — Progress Notes (Signed)
 "  Advanced Hypertension Clinic Assessment:    Date:  01/01/2025   ID:  Louis Perez, DOB Sep 06, 1951, MRN [id number]  PCP:  de [place], Raymond J, MD  Cardiologist:  Vinie JAYSON Maxcy, MD  Nephrologist:  Referring MD: Macarthur Elouise SQUIBB, DO   CC: Hypertension  History of Present Illness:    Louis Perez is a 74 y.o. male with a hx of hypertension, CAD (2017 NSTEMI with PCI-DES to ostial LAD), HLD, ascending aortic aneurysm, sinus bradycardia here to follow up in the Advanced Hypertension Clinic. 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  Seen by Dr. Maxcy 04/06/23 noting labile blood pressure including hypotension recommended for sleep study and referral to Advanced Hypertension Clinic. Subsequently evaluated by Barnie Grew 04/23/23 noting exertional dyspnea and chest pain.  LHC performed 5/1 concerning for three-vessel disease with widely patent LAD stent, improved stabilization of vascular RI lesion when compared to prior, ostial PDA 55% with normal LVEF 55 to 60%.  Recommended to consider nonanginal/noncardiac etiology for dyspnea.  Established with Hypertension Clinic 05/20/23. Louis Perez was diagnosed with hypertension in his 39s. Reading with home arm cuff often 110s/60s. Prior tobacco use having quit in 1970. Alcohol use only socially. Metanephrines, catecholamines, TSH were unremarkable. Itamar home sleep study was ordered but not yet completed .  At last ADV HTN visit 07/22/23 hypotension resolved with adjusting olmesartan  to bedtime dosing.  Seen by Dr. Maxcy 10/12/23 and again encouraged to complete home sleep study.   At visit 07/22/23 hypotension resolved with adjusting Olmesartan  to evening dosing. At visit 11/2023 he was given PRN Hydralazine  for transient elevated BP. At visit 06/06/24 due to activity intolerance Carvedilol  reduced to 3.125mg  BID. Lexiscan  myoview  08/03/24 for preop clearance low risk study with no ischemia.   Last seen 11/01/24 with  consistently elevated at home.  He also had reduced his physical activity from walking 4 miles to 2 miles.  Amlodipine  2.5 mg daily initiated.  Olmesartan  40 mg daily, carvedilol  3.125 mg twice daily, hydralazine  25 as needed continued.  Renin aldosterone with no evidence of hyperaldosteronism.  Presents today for follow up. BP log of ~15 readings with only 2 being <130/80. He has added the Amlodipine  in the morning. He sits and rests prior to checking blood pressure. Interested in changing from two 20mg  tablets of Olmesartan  at night to one 40mg  tablet. Participating in PT after shoulder surgery. Has had some swelling in his hands. He has not been walking since shoulder surgery on 11/15/24. He notes increased caffeine intake as drinking  predominantly Diet Dr. Nunzio or Dr. Nunzio zero, willing to trial caffeine free version.  Previous antihypertensives: Losartan -HCTZ Hydralazine   Past Medical History:  Diagnosis Date   Allergy     Arthritis    hips, lower back, knees, shoulders, hands (05/14/2016)   Asthma dx'd in 2014   Bacterial septicemia (HCC) 01/2011   both knee involved   CAD (coronary artery disease)    a. NSTEMI 02/2016 s/p DES to LAD. b. Relook cath due to angina/prior high risk anatomy 05/14/16 -> continued medical therapy given concern that a stent strut could inferere with Cx itself.   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    NSTEMI (non-ST elevated myocardial infarction) (HCC) 02/2016   thelbert 03/02/2016   Recurrent upper respiratory infection (URI)    Sinus bradycardia    Unspecified tinnitus  right    Past Surgical History:  Procedure Laterality Date   BACK SURGERY     CARDIAC CATHETERIZATION N/A 03/01/2016   Procedure: Left Heart Cath and Coronary Angiography;  Surgeon: Ozell Fell, MD;  oLAD 95%, CFX/RI/RCA/RPDA 25-30%   CARDIAC CATHETERIZATION N/A 03/01/2016    Procedure: Coronary Stent Intervention;  Surgeon: Ozell Fell, MD;  Promus Premier 4.0 x  12 mm DES to oLAD   CARDIAC CATHETERIZATION N/A 05/14/2016   Procedure: Left Heart Cath and Coronary Angiography;  Surgeon: Debby DELENA Sor, MD;  Location: [facility] INVASIVE CV LAB;  Service: Cardiovascular;  Laterality: N/A;   CARDIAC CATHETERIZATION N/A 06/17/2016   Procedure: Intravascular Pressure Wire/FFR Study;  Surgeon: Ozell Fell, MD;  Location: [facility] INVASIVE CV LAB;  Service: Cardiovascular;  Laterality: N/A;   CARDIOVASCULAR STRESS TEST  05/26/2012   EKG negative for ischemia, no ECG changes, no significant ischemia noted   I & D KNEE WITH POLY EXCHANGE Bilateral 01/2011   & complete synovectomy of 3 compartments of the right total knee/notes 02/04/2011 (replacement,partial knee replacement because of bacterial infection)   JOINT REPLACEMENT     KNEE ARTHROSCOPY Right ~ 2002   LEFT HEART CATH AND CORONARY ANGIOGRAPHY N/A 04/28/2023   Procedure: LEFT HEART CATH AND CORONARY ANGIOGRAPHY;  Surgeon: Anner Alm ORN, MD;  Location: [facility] INVASIVE CV LAB;  Service: Cardiovascular;  Laterality: N/A;   LUMBAR DISC SURGERY  ~ 1980; 2000   NASAL SINUS SURGERY  08/2011   SHOULDER ARTHROSCOPY W/ ROTATOR CUFF REPAIR Right 01/2015   TOTAL KNEE ARTHROPLASTY Bilateral 03/26/2004-09/15/2004    right-left   TRANSTHORACIC ECHOCARDIOGRAM  05/26/2012   EF >55%, mild concentric LVH    Current Medications: Current Meds  Medication Sig   albuterol  (PROVENTIL ) (2.5 MG/3ML) 0.083% nebulizer solution Take 3 mLs (2.5 mg total) by nebulization every 4 (four) hours as needed for wheezing or shortness of breath.   albuterol  (VENTOLIN  HFA) 108 (90 Base) MCG/ACT inhaler Inhale 2 puffs into the lungs every 4 (four) hours as needed for wheezing or shortness of breath.   amLODipine  (NORVASC ) 2.5 MG tablet Take 1 tablet (2.5 mg total) by mouth daily.   aspirin  81 MG tablet Take 81 mg by mouth at bedtime.    Aspirin -Acetaminophen -Caffeine  (GOODY HEADACHE PO) Take 1 packet by mouth daily as needed (pain).   budesonide -glycopyrrolate-formoterol  (BREZTRI  AEROSPHERE) 160-9-4.8 MCG/ACT AERO inhaler Inhale 2 puffs into the lungs in the morning and at bedtime.   carvedilol  (COREG ) 3.125 MG tablet Take 1 tablet (3.125 mg total) by mouth 2 (two) times daily.   cetirizine  (EQ ALLERGY  RELIEF, CETIRIZINE ,) 10 MG tablet TAKE 1 TABLET BY MOUTH ONCE DAILY AS NEEDED FOR ALLERGIES (CAN TAKE AN EXTRA DOSE DURING FLARE UPS)   clotrimazole  (MYCELEX ) 10 MG troche Take 1 tablet (10 mg total) by mouth in the morning and at bedtime. Take after each use of Breztri  (Patient taking differently: Take 10 mg by mouth as needed. Take after each use of Breztri )   EQ PAIN RELIEVER EX ST 500 MG tablet Take 1,000 mg by mouth every 8 (eight) hours.   famotidine  (PEPCID ) 40 MG tablet Take 1 tablet (40 mg total) by mouth daily as needed for heartburn or indigestion (Can take an etxra dose during flare ups.).   hydrALAZINE  (APRESOLINE ) 25 MG tablet Take 1 tablet (25 mg total) by mouth as needed (as needed for systolic blood pressure more than 160).   ipratropium (ATROVENT ) 0.06 % nasal spray Place 2 sprays into both nostrils  4 (four) times daily. 1-2 sprays each nostril every 6 hours to dry nose   isosorbide  mononitrate (IMDUR ) 60 MG 24 hr tablet Take 1 tablet (60 mg total) by mouth daily.   levocetirizine (XYZAL ) 5 MG tablet TAKE 1 TABLET BY MOUTH TWICE DAILY AS NEEDED CAN TAKE AN EXTRA DOSE DURING FLARE UPS. APPOINTMENT REQUIRED FOR FUTURE REFILLS   meloxicam (MOBIC) 7.5 MG tablet Take 7.5 mg by mouth 2 (two) times daily.   Nebulizer MISC 1 Device by Does not apply route as directed.   nitroGLYCERIN  (NITROSTAT ) 0.4 MG SL tablet Place 1 tablet (0.4 mg total) under the tongue every 5 (five) minutes as needed for chest pain.   olmesartan  (BENICAR ) 20 MG tablet Take 2 tablets (40 mg total) by mouth daily.   pantoprazole  (PROTONIX ) 40 MG tablet Take 40 mg by mouth 2 (two)  times daily.   Spacer/Aero-Holding Chambers DEVI 1 Device by Does not apply route as directed.   Study - ORION 4 - inclisiran 300 mg/1.5mL or placebo SQ injection (PI-Stuckey) Inject 300 mg into the skin every 6 (six) months. For Investigational Use Only. Please alert research coordinator of change/addition of any lipid lowering agent or lipid panels drawn per research protocol. ORION 4 STUDY MEDICATION - inclisiran 300 mg/1.5 mL or placebo SQ injection (PI-Stuckey)   sucralfate  (CARAFATE ) 1 g tablet TAKE 1 TABLET BY MOUTH IN THE MORNING, AT NOON, IN THE EVENING AND AT BEDTIME   Current Facility-Administered Medications for the 01/01/25 encounter (Appointment) with Rogen Porte S, NP  Medication   Study - ORION 4 - inclisiran 300 mg/1.5mL or placebo SQ injection (PI-Stuckey)   Study - ORION 4 - inclisiran 300 mg/1.5mL or placebo SQ injection (PI-Stuckey)   tezepelumab -ekko (TEZSPIRE ) 210 MG/1. syringe 210 mg     Allergies:   Oxycodone, Celecoxib, and Ambien [zolpidem tartrate]   Social History   Socioeconomic History   Marital status: Married    Spouse name: Not on file   Number of children: 2   Years of education: 14   Highest education level: Associate degree: academic program  Occupational History   Occupation: retired Architect, part-time security work  Tobacco Use   Smoking status: Former    Current packs/day: 0.00    Average packs/day: 1 pack/day for 5.0 years (5.0 ttl pk-yrs)    Types: Cigarettes    Start date: 12/15/1963    Quit date: 12/14/1968    Years since quitting: 56.0   Smokeless tobacco: Never  Vaping Use   Vaping status: Never Used  Substance and Sexual Activity   Alcohol use: Yes    Alcohol/week: 1.0 standard drink of alcohol    Types: 1 Cans of beer per week    Comment: Occasional   Drug use: Never   Sexual activity: Not Currently    Partners: Female    Comment: Married  Other Topics Concern   Not on file  Social History Narrative   Patient  lives at home with his wife . He is retired. Patient has 14 years of education. Patient has two children.   Caffeine - two cups daily.   Right-handed   Social Drivers of Health   Tobacco Use: Medium Risk (01/01/2025)   Patient History    Smoking Tobacco Use: Former    Smokeless Tobacco Use: Never    Passive Exposure: Not on file  Financial Resource Strain: High Risk (12/29/2024)   Overall Financial Resource Strain (CARDIA)    Difficulty of Paying Living Expenses: Very  hard  Food Insecurity: No Food Insecurity (12/29/2024)   Epic    Worried About Programme Researcher, Broadcasting/film/video in the Last Year: Never true    Ran Out of Food in the Last Year: Never true  Transportation Needs: No Transportation Needs (12/29/2024)   Epic    Lack of Transportation (Medical): No    Lack of Transportation (Non-Medical): No  Physical Activity: Sufficiently Active (12/29/2024)   Exercise Vital Sign    Days of Exercise per Week: 3 days    Minutes of Exercise per Session: 60 min  Stress: Stress Concern Present (12/29/2024)   [employer] of Occupational Health - Occupational Stress Questionnaire    Feeling of Stress: To some extent  Social Connections: Unknown (12/29/2024)   Social Connection and Isolation Panel    Frequency of Communication with Friends and Family: More than three times a week    Frequency of Social Gatherings with Friends and Family: More than three times a week    Attends Religious Services: More than 4 times per year    Active Member of Clubs or Organizations: Not on file    Attends Banker Meetings: Not on file    Marital Status: Married  Depression (PHQ2-9): Low Risk (12/29/2024)   Depression (PHQ2-9)    PHQ-2 Score: 0  Alcohol Screen: Low Risk (12/29/2024)   Alcohol Screen    Last Alcohol Screening Score (AUDIT): 1  Housing: Low Risk (12/29/2024)   Epic    Unable to Pay for Housing in the Last Year: No    Number of Times Moved in the Last Year: 0    Homeless in the Last Year:  No  Utilities: Low Risk (01/19/2024)   Received from [facility]   Utilities    In the past 12 months has the electric, gas, oil, or water company threatened to shut off services in your home? : No  Health Literacy: Not on file     Family History: The patient's family history includes Alzheimer's disease in his father; Cancer in his paternal aunt, paternal uncle, paternal uncle, and paternal uncle; Dementia in his father; Diabetes in his brother; Heart attack in his mother; Heart disease in his mother; Heart disease (age of onset: 84) in his brother.  ROS:   Please see the history of present illness.    All other systems reviewed and are negative.  EKGs/Labs/Other Studies Reviewed:         Recent Labs: 09/11/2024: BUN 16; Creatinine, Ser 0.71; Potassium 4.1; Sodium 143   Recent Lipid Panel    Component Value Date/Time   CHOL 173 06/26/2020 0927   CHOL 136 01/08/2017 0833   TRIG 103 06/26/2020 0927   TRIG 109 01/08/2017 0833   HDL 54 06/26/2020 0927   HDL 53 01/08/2017 0833   CHOLHDL 3.2 06/26/2020 0927   CHOLHDL 2.6 01/08/2017 0833   CHOLHDL 3.0 03/01/2016 0448   VLDL 17 03/01/2016 0448   LDLCALC 100 (H) 06/26/2020 0927   LDLCALC 64 01/08/2017 0833    Physical Exam:   VS:  There were no vitals taken for this visit. , BMI There is no height or weight on file to calculate BMI. GENERAL:  Well appearing HEENT: Pupils equal round and reactive, fundi not visualized, oral mucosa unremarkable NECK:  No jugular venous distention, waveform within normal limits, carotid upstroke brisk and symmetric, no bruits, no thyromegaly LYMPHATICS:  No cervical adenopathy LUNGS:  Clear to auscultation bilaterally HEART:  RRR.  PMI not displaced or  sustained,S1 and S2 within normal limits, no S3, no S4, no clicks, no rubs, no murmurs ABD:  Flat, positive bowel sounds normal in frequency in pitch, no bruits, no rebound, no guarding, no midline pulsatile mass, no hepatomegaly, no  splenomegaly EXT:  2 plus pulses throughout, no edema, no cyanosis no clubbing SKIN:  No rashes no nodules. NEURO:  Cranial nerves II through XII grossly intact, motor grossly intact throughout PSYCH:  Cognitively intact, oriented to person place and time   ASSESSMENT/PLAN:    HTN -longstanding history of hypertension having been diagnosed in his 38s.  Previous symptomatic hypotension has not recurred. BP not at goal <130/80 by home or clinic readings. Advised to reduce in caffeine intake by switching to diet Dr. Pepper caffeine free. Discussed that inactivity after shoulder surgery may contribute to elevated blood pressure. Increase amlodipine  from 2.5 to 5 mg daily. Continue  Olmesartan  40mg  QHS, Carvedilol  3.125mg  BID, Imdur  60mg  QD.   Secondary workup: BP symmetrical, no indication for carotid duplex nor evidence of aortic coarctation.  Renal duplex 03/2022 no stenosis Metanephrines, catecholamines, TSH normal. Renin duplex unremarkable. Itamar sleep study approved, encouraged to complete  Snores - Previously provided and approved for Itamar home sleep study. Has not yet completed.  Coronary artery disease/hyperlipidemia, LDL goal less than 70- LHC 04/28/23 patent stent and otherwise nonobstructive disease. 07/2024 myoview  low risk study. Stable with no anginal symptoms. No indication for ischemic evaluation. GDMT aspirin  81mg  daily, carvedilol  3.125mg  BID, imdur  60mg  daily. Prior statin intolerance, participating in [place] lipid trial and as such lipid results are blinded. Heart healthy diet and regular cardiovascular exercise encouraged.    Screening for Secondary Hypertension:     05/20/2023   12:43 PM  Causes  Drugs/Herbals Screened     - Comments 05/20/23 recommend decreased caffeine intake  Renovascular HTN Screened     - Comments renal duplex 03/2023 no stenosis  Sleep Apnea Screened  Thyroid Disease Screened  Pheochromocytoma Screened     - Comments 05/2023 metanephrines,  catecholamines  Cushing's Syndrome N/A     - Comments non cushingoid appearance  Coarctation of the Aorta N/A     - Comments symmetrical BP  Compliance Screened     - Comments Takes medications regularly    Relevant Labs/Studies:    Latest Ref Rng & Units 09/11/2024    2:59 PM 04/23/2023    3:34 PM 03/26/2022    3:07 PM  Basic Labs  Sodium 134 - 144 mmol/L 143  142  143   Potassium 3.5 - 5.2 mmol/L 4.1  4.3  4.5   Creatinine 0.76 - 1.27 mg/dL 9.28  9.20  9.09        Latest Ref Rng & Units 05/20/2023    8:57 AM 03/26/2022    3:07 PM  Thyroid   TSH 0.450 - 4.500 uIU/mL 1.180  1.080        Latest Ref Rng & Units 11/01/2024   12:15 PM  Renin/Aldosterone   Aldosterone 0.0 - 30.0 ng/dL 2.9   Aldos/Renin Ratio 0.0 - 30.0 7.5        Latest Ref Rng & Units 05/20/2023    8:57 AM  Metanephrines/Catecholamines   Epinephrine 0 - 62 pg/mL 18   Norepinephrine 0 - 874 pg/mL 903   Dopamine 0 - 48 pg/mL <30   Metanephrines 0.0 - 88.0 pg/mL <25.0   Normetanephrines  0.0 - 285.2 pg/mL 60.7           04/03/2022  8:41 AM  Renovascular   Renal Artery US  Completed Yes       Disposition:    FU with MD/APP/PharmD in 3 months    Medication Adjustments/Labs and Tests Ordered: Current medicines are reviewed at length with the patient today.  Concerns regarding medicines are outlined above.  No orders of the defined types were placed in this encounter.  No orders of the defined types were placed in this encounter.    Signed, Reche GORMAN Finder, NP  01/01/2025 8:12 AM    [facility] "

## 2025-01-01 NOTE — Patient Instructions (Signed)
 Medication Instructions:   INCREASE your Amlodipine  to 5 mg by mouth daily  Change Olmesartan  to 40 mg by mouth daily      Follow-Up: Please follow up in _3_ months in ADV HTN CLINIC with Dr. Raford, Reche Finder, NP or Allean Mink PharmD

## 2025-01-02 ENCOUNTER — Telehealth: Payer: Self-pay | Admitting: Family Medicine

## 2025-01-02 ENCOUNTER — Ambulatory Visit

## 2025-01-02 ENCOUNTER — Other Ambulatory Visit: Payer: Self-pay | Admitting: Allergy and Immunology

## 2025-01-02 NOTE — Telephone Encounter (Signed)
 Ed has been referred to [facility] and [facility].  I will reach out in a week to check progress with [facility].

## 2025-01-04 ENCOUNTER — Ambulatory Visit: Admitting: Physical Therapy

## 2025-01-04 ENCOUNTER — Ambulatory Visit (INDEPENDENT_AMBULATORY_CARE_PROVIDER_SITE_OTHER)

## 2025-01-04 DIAGNOSIS — G8929 Other chronic pain: Secondary | ICD-10-CM

## 2025-01-04 DIAGNOSIS — J4550 Severe persistent asthma, uncomplicated: Secondary | ICD-10-CM | POA: Diagnosis not present

## 2025-01-04 DIAGNOSIS — M25612 Stiffness of left shoulder, not elsewhere classified: Secondary | ICD-10-CM

## 2025-01-04 DIAGNOSIS — M5459 Other low back pain: Secondary | ICD-10-CM

## 2025-01-04 DIAGNOSIS — M25512 Pain in left shoulder: Secondary | ICD-10-CM | POA: Diagnosis not present

## 2025-01-04 NOTE — Therapy (Signed)
 " OUTPATIENT PHYSICAL THERAPY UPPER EXTREMITY TREATMENT   Patient Name: Louis Perez MRN: [id number] DOB:Mar 11, 1951, 74 y.o., male Today's Date: 01/04/2025  END OF SESSION:  PT End of Session - 01/04/25 0809     Visit Number 14    Number of Visits 20    Date for Recertification  01/26/25    PT Start Time 0800    PT Stop Time 0843    PT Time Calculation (min) 43 min    Activity Tolerance Patient tolerated treatment well    Behavior During Therapy [facility] for tasks assessed/performed            Past Medical History:  Diagnosis Date   Allergy     Arthritis    hips, lower back, knees, shoulders, hands (05/14/2016)   Asthma dx'd in 2014   Bacterial septicemia (HCC) 01/2011   both knee involved   CAD (coronary artery disease)    a. NSTEMI 02/2016 s/p DES to LAD. b. Relook cath due to angina/prior high risk anatomy 05/14/16 -> continued medical therapy given concern that a stent strut could inferere with Cx itself.   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    NSTEMI (non-ST elevated myocardial infarction) (HCC) 02/2016   thelbert 03/02/2016   Recurrent upper respiratory infection (URI)    Sinus bradycardia    Unspecified tinnitus    right   Past Surgical History:  Procedure Laterality Date   BACK SURGERY     CARDIAC CATHETERIZATION N/A 03/01/2016   Procedure: Left Heart Cath and Coronary Angiography;  Surgeon: Ozell Fell, MD;  oLAD 95%, CFX/RI/RCA/RPDA 25-30%   CARDIAC CATHETERIZATION N/A 03/01/2016   Procedure: Coronary Stent Intervention;  Surgeon: Ozell Fell, MD;  Promus Premier 4.0 x  12 mm DES to oLAD   CARDIAC CATHETERIZATION N/A 05/14/2016   Procedure: Left Heart Cath and Coronary Angiography;  Surgeon: Debby DELENA Sor, MD;  Location: [facility] INVASIVE CV LAB;  Service: Cardiovascular;  Laterality: N/A;   CARDIAC CATHETERIZATION N/A 06/17/2016   Procedure: Intravascular  Pressure Wire/FFR Study;  Surgeon: Ozell Fell, MD;  Location: [facility] INVASIVE CV LAB;  Service: Cardiovascular;  Laterality: N/A;   CARDIOVASCULAR STRESS TEST  05/26/2012   EKG negative for ischemia, no ECG changes, no significant ischemia noted   I & D KNEE WITH POLY EXCHANGE Bilateral 01/2011   & complete synovectomy of 3 compartments of the right total knee/notes 02/04/2011 (replacement,partial knee replacement because of bacterial infection)   JOINT REPLACEMENT     KNEE ARTHROSCOPY Right ~ 2002   LEFT HEART CATH AND CORONARY ANGIOGRAPHY N/A 04/28/2023   Procedure: LEFT HEART CATH AND CORONARY ANGIOGRAPHY;  Surgeon: Anner Alm ORN, MD;  Location: [facility] INVASIVE CV LAB;  Service: Cardiovascular;  Laterality: N/A;   LUMBAR DISC SURGERY  ~ 1980; 2000   NASAL SINUS SURGERY  08/2011   SHOULDER ARTHROSCOPY W/ ROTATOR CUFF REPAIR Right 01/2015   TOTAL KNEE ARTHROPLASTY Bilateral 03/26/2004-09/15/2004    right-left   TRANSTHORACIC ECHOCARDIOGRAM  05/26/2012   EF >55%, mild concentric LVH   Patient Active Problem List   Diagnosis Date Noted   Memory changes 12/29/2024   Dysphagia 12/18/2024   Hoarseness 12/18/2024   Constipation 06/29/2022   Other specified abnormal findings of blood chemistry 06/29/2022   Severe persistent asthma with acute exacerbation (HCC) 11/25/2020   Shortness of breath 11/25/2020   Viral upper respiratory infection 11/25/2020  Asthma, severe persistent, well-controlled (HCC) 11/09/2018   Other allergic rhinitis 11/09/2018   LPRD (laryngopharyngeal reflux disease) 11/09/2018   Stomatitis and mucositis 11/09/2018   Tinnitus of both ears 09/05/2018   Visual disturbance 09/01/2018   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   Sensorineural hearing loss (SNHL) of right ear 07/11/2018   Pansinusitis 01/06/2017   Hyperlipidemia LDL goal <70 07/23/2016   Anginal chest pain at rest 06/17/2016   Stented coronary artery 06/15/2016   Pain in the chest 05/28/2016   Sinus bradycardia  05/15/2016   Unstable angina (HCC) 05/14/2016   CAD (coronary artery disease), native coronary artery 05/14/2016   Adjustment insomnia 02/28/2016   Gilbert's syndrome 02/28/2016   Hematospermia 02/28/2016   Relative erythrocytosis 02/28/2016   Dyslipidemia 09/28/2013   GERD (gastroesophageal reflux disease) 09/28/2013   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    THERAPY DIAG:  Chronic left shoulder pain  Stiffness of left shoulder, not elsewhere classified  Other low back pain  Rationale for Evaluation and Treatment: Rehabilitation  ONSET DATE: Surgery date (11/15/24).    SUBJECTIVE:   SUBJECTIVE STATEMENT: Doing good.    PERTINENT HISTORY: See above.   PAIN:  Are you having pain? Yes: NPRS scale: 1-2/10 Pain location: left shoulder  PRECAUTIONS: Other: Progress per protocol.  No ultrasound.    RED FLAGS: None   WEIGHT BEARING RESTRICTIONS: No left UE weight bearing.    FALLS:  Has patient fallen in last 6 months? Couple times.  LIVING ENVIRONMENT: Lives with: lives with their spouse Lives in: House/apartment Has following equipment at home: None  OCCUPATION: Retired.    PLOF: Independent  PATIENT GOALS: Use left UE without pain.    OBJECTIVE:   PATIENT SURVEYS:  QUICKDASH:  46 points (79.55%).     PALPATION: Ecchymosis noted.  Aquacel intact.    UPPER EXTREMITY ROM: In supine:  Gentle PROM into left shoulder flexion to 90 degrees with increased pain and ER to 15 degrees.                                                                                                                               TREATMENT DATE:    01/04/25:                                       EXERCISE LOG  Exercise Repetitions and Resistance Comments  UBE 10 minutes   UE ranger on  wall 5 minutes    Pulleys 7 minutes    RW4 Yellow band for ER and others with red band to fatigue x 2.     Ball on wall 5 minutes    PROM x 7 minutes   12/28/24:  EXERCISE LOG  Exercise Repetitions and Resistance Comments  UBE 6 mins 120 RPM (forward/backward)   Pulleys 8 minutes    Wall ladder    Swiss ball on wall 5 minutes    UE Ranger on wall Multi-directional x 6  minutes    Cone to Shelf 3# x 20 reps 2nd shelf; 3# x 10 reps 3rd   Wall Push Ups 25 reps   Rows Red 2 sets of 20 reps   Extensions Red 2 sets of 20 reps   ER Red x 25 reps   Biceps 3-way 4# x 25 reps each way   Vasopneumatic x 15 minutes with pillow between left elbow and thorax.    12/18/24:                                   EXERCISE LOG  Exercise Repetitions and Resistance Comments  Pulleys  5 minutes    Wall ladder 5 minutes    Ball on wall 5 minutes    UE ranger on wall 6  minutes        In supine:  PROM x 17 minutes f/b vasopneumatic x 15 minutes with pillow between left elbow and thorax.     12/15/24:                                     EXERCISE LOG  Exercise Repetitions and Resistance Comments  Pulleys 5 minutes   UE ranger on wall 6 minutes   Wall ladder 5 minutes   Ball on wall 6 minutes        In supine:  PROM x 16 minutes f/b vasopneumatic x 15 minutes with pillow between left elbow and thorax.     PATIENT EDUCATION:  Education details: See below.   Person educated: Patient Education method: Explanation, Demonstration, Tactile cues, Verbal cues, and Handouts Education comprehension: verbalized understanding, returned demonstration, verbal cues required, and tactile cues required  HOME EXERCISE PROGRAM: CANE CANE  [[id number]]  Seated Wand UE External Rotation -  Repeat 10 Repetitions, Hold 10 Seconds, Complete 2 Sets, Perform 3 Times a Day  Closed chain flexion stretch -  Repeat 10 Repetitions, Hold 10 Seconds, Complete 2 Sets, Perform 4 Times a Day  WAND  EXTERNAL ROTATION - SUPINE ER -  Repeat 10 Repetitions, Hold 10 Seconds, Complete 2 Sets, Perform 4 Times a Day  ASSESSMENT:  CLINICAL IMPRESSION: Excellent progress toward goals.  Recommended resisted ER with yellow theraband.  OBJECTIVE IMPAIRMENTS: decreased activity tolerance, decreased ROM, and pain.   ACTIVITY LIMITATIONS: carrying, lifting, dressing, and reach over head  PARTICIPATION LIMITATIONS: meal prep, cleaning, laundry, and yard work  PERSONAL FACTORS: Time since onset of injury/illness/exacerbation are also affecting patient's functional outcome.   REHAB POTENTIAL: Excellent  CLINICAL DECISION MAKING: Stable/uncomplicated  EVALUATION COMPLEXITY: Low   GOALS:   LONG TERM GOALS: Target date: 02/09/25  Ind with a HEP.  Goal status: IN PROGRESS  2.  Active left shoulder flexion to 145 degrees so the patient can easily reach overhead.   1/12: 147 degrees Goal status: MET  3.  Active ER to 60 degrees+ to allow for easily donning/doffing of apparel.   1/12: 69 degrees Goal status: MET  4.  Perform ADL's with pain not > 3/10.   1/12: 4-5/10  Goal status: IN PROGRESS  5.  Improve QUICKDASH score by at least 15 points.   1/12: 22.7% Goal status: MET  PLAN:  PT FREQUENCY: 2x/week  PT DURATION: 6 weeks  PLANNED INTERVENTIONS: 97110-Therapeutic exercises, 97530- Therapeutic activity, [id number]- Neuromuscular re-education, 97535- Self Care, 02859- Manual therapy, G0283- Electrical stimulation (unattended), 97016- Vasopneumatic device, Patient/Family education, and Cryotherapy  PLAN FOR NEXT SESSION: Progress per protocol.  Vasopneumatic with pillow between thorax and elbow.       Coen Miyasato, PT 01/04/2025, 8:58 AM  "

## 2025-01-10 ENCOUNTER — Encounter: Payer: Self-pay | Admitting: Physical Therapy

## 2025-01-10 ENCOUNTER — Ambulatory Visit: Admitting: Physical Therapy

## 2025-01-10 DIAGNOSIS — G8929 Other chronic pain: Secondary | ICD-10-CM

## 2025-01-10 DIAGNOSIS — M25512 Pain in left shoulder: Secondary | ICD-10-CM | POA: Diagnosis not present

## 2025-01-10 DIAGNOSIS — M25612 Stiffness of left shoulder, not elsewhere classified: Secondary | ICD-10-CM

## 2025-01-10 NOTE — Therapy (Signed)
 " OUTPATIENT PHYSICAL THERAPY UPPER EXTREMITY TREATMENT   Patient Name: Louis Perez MRN: [id number] DOB:08/03/1951, 74 y.o., male Today's Date: 01/10/2025  END OF SESSION:  PT End of Session - 01/10/25 0830     Visit Number 15    Number of Visits 20    Date for Recertification  01/26/25    PT Start Time 0759    PT Stop Time 0850    PT Time Calculation (min) 51 min    Activity Tolerance Patient tolerated treatment well    Behavior During Therapy [facility] for tasks assessed/performed            Past Medical History:  Diagnosis Date   Allergy     Arthritis    hips, lower back, knees, shoulders, hands (05/14/2016)   Asthma dx'd in 2014   Bacterial septicemia (HCC) 01/2011   both knee involved   CAD (coronary artery disease)    a. NSTEMI 02/2016 s/p DES to LAD. b. Relook cath due to angina/prior high risk anatomy 05/14/16 -> continued medical therapy given concern that a stent strut could inferere with Cx itself.   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    NSTEMI (non-ST elevated myocardial infarction) (HCC) 02/2016   thelbert 03/02/2016   Recurrent upper respiratory infection (URI)    Sinus bradycardia    Unspecified tinnitus    right   Past Surgical History:  Procedure Laterality Date   BACK SURGERY     CARDIAC CATHETERIZATION N/A 03/01/2016   Procedure: Left Heart Cath and Coronary Angiography;  Surgeon: Ozell Fell, MD;  oLAD 95%, CFX/RI/RCA/RPDA 25-30%   CARDIAC CATHETERIZATION N/A 03/01/2016   Procedure: Coronary Stent Intervention;  Surgeon: Ozell Fell, MD;  Promus Premier 4.0 x  12 mm DES to oLAD   CARDIAC CATHETERIZATION N/A 05/14/2016   Procedure: Left Heart Cath and Coronary Angiography;  Surgeon: Debby DELENA Sor, MD;  Location: [facility] INVASIVE CV LAB;  Service: Cardiovascular;  Laterality: N/A;   CARDIAC CATHETERIZATION N/A 06/17/2016   Procedure: Intravascular  Pressure Wire/FFR Study;  Surgeon: Ozell Fell, MD;  Location: [facility] INVASIVE CV LAB;  Service: Cardiovascular;  Laterality: N/A;   CARDIOVASCULAR STRESS TEST  05/26/2012   EKG negative for ischemia, no ECG changes, no significant ischemia noted   I & D KNEE WITH POLY EXCHANGE Bilateral 01/2011   & complete synovectomy of 3 compartments of the right total knee/notes 02/04/2011 (replacement,partial knee replacement because of bacterial infection)   JOINT REPLACEMENT     KNEE ARTHROSCOPY Right ~ 2002   LEFT HEART CATH AND CORONARY ANGIOGRAPHY N/A 04/28/2023   Procedure: LEFT HEART CATH AND CORONARY ANGIOGRAPHY;  Surgeon: Anner Alm ORN, MD;  Location: [facility] INVASIVE CV LAB;  Service: Cardiovascular;  Laterality: N/A;   LUMBAR DISC SURGERY  ~ 1980; 2000   NASAL SINUS SURGERY  08/2011   SHOULDER ARTHROSCOPY W/ ROTATOR CUFF REPAIR Right 01/2015   TOTAL KNEE ARTHROPLASTY Bilateral 03/26/2004-09/15/2004    right-left   TRANSTHORACIC ECHOCARDIOGRAM  05/26/2012   EF >55%, mild concentric LVH   Patient Active Problem List   Diagnosis Date Noted   Memory changes 12/29/2024   Dysphagia 12/18/2024   Hoarseness 12/18/2024   Constipation 06/29/2022   Other specified abnormal findings of blood chemistry 06/29/2022   Severe persistent asthma with acute exacerbation (HCC) 11/25/2020   Shortness of breath 11/25/2020   Viral upper respiratory infection 11/25/2020  Asthma, severe persistent, well-controlled (HCC) 11/09/2018   Other allergic rhinitis 11/09/2018   LPRD (laryngopharyngeal reflux disease) 11/09/2018   Stomatitis and mucositis 11/09/2018   Tinnitus of both ears 09/05/2018   Visual disturbance 09/01/2018   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   Sensorineural hearing loss (SNHL) of right ear 07/11/2018   Pansinusitis 01/06/2017   Hyperlipidemia LDL goal <70 07/23/2016   Anginal chest pain at rest 06/17/2016   Stented coronary artery 06/15/2016   Pain in the chest 05/28/2016   Sinus bradycardia  05/15/2016   Unstable angina (HCC) 05/14/2016   CAD (coronary artery disease), native coronary artery 05/14/2016   Adjustment insomnia 02/28/2016   Gilbert's syndrome 02/28/2016   Hematospermia 02/28/2016   Relative erythrocytosis 02/28/2016   Dyslipidemia 09/28/2013   GERD (gastroesophageal reflux disease) 09/28/2013   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    THERAPY DIAG:  Chronic left shoulder pain  Stiffness of left shoulder, not elsewhere classified  Rationale for Evaluation and Treatment: Rehabilitation  ONSET DATE: Surgery date (11/15/24).    SUBJECTIVE:   SUBJECTIVE STATEMENT: No new complaints.  PERTINENT HISTORY: See above.   PAIN:  Are you having pain? Yes: NPRS scale: 1-2/10 Pain location: left shoulder  PRECAUTIONS: Other: Progress per protocol.  No ultrasound.    RED FLAGS: None   WEIGHT BEARING RESTRICTIONS: No left UE weight bearing.    FALLS:  Has patient fallen in last 6 months? Couple times.  LIVING ENVIRONMENT: Lives with: lives with their spouse Lives in: House/apartment Has following equipment at home: None  OCCUPATION: Retired.    PLOF: Independent  PATIENT GOALS: Use left UE without pain.    OBJECTIVE:   PATIENT SURVEYS:  QUICKDASH:  46 points (79.55%).     PALPATION: Ecchymosis noted.  Aquacel intact.    UPPER EXTREMITY ROM: In supine:  Gentle PROM into left shoulder flexion to 90 degrees with increased pain and ER to 15 degrees.                                                                                                                               TREATMENT DATE:    01/10/25:                                       EXERCISE LOG  Exercise Repetitions and Resistance Comments  UBE 10 minutes   Pulleys  6 minutes    Wall  ladder with 2# 6 minutes   Swill ball on wall 5 minutes        PROM x 11 minutes   01/04/25:  EXERCISE LOG  Exercise Repetitions and Resistance Comments  UBE 10 minutes   UE ranger on wall 5 minutes    Pulleys 7 minutes    RW4 Yellow band for ER and others with red band to fatigue x 2.     Ball on wall 5 minutes    PROM x 7 minutes   12/28/24:                                    EXERCISE LOG  Exercise Repetitions and Resistance Comments  UBE 6 mins 120 RPM (forward/backward)   Pulleys 8 minutes    Wall ladder    Swiss ball on wall 5 minutes    UE Ranger on wall Multi-directional x 6  minutes    Cone to Shelf 3# x 20 reps 2nd shelf; 3# x 10 reps 3rd   Wall Push Ups 25 reps   Rows Red 2 sets of 20 reps   Extensions Red 2 sets of 20 reps   ER Red x 25 reps   Biceps 3-way 4# x 25 reps each way   Vasopneumatic x 15 minutes with pillow between left elbow and thorax.    12/18/24:                                   EXERCISE LOG  Exercise Repetitions and Resistance Comments  Pulleys  5 minutes    Wall ladder 5 minutes    Ball on wall 5 minutes    UE ranger on wall 6  minutes        In supine:  PROM x 17 minutes f/b vasopneumatic x 15 minutes with pillow between left elbow and thorax.     12/15/24:                                     EXERCISE LOG  Exercise Repetitions and Resistance Comments  Pulleys 5 minutes   UE ranger on wall 6 minutes   Wall ladder 5 minutes   Ball on wall 6 minutes        In supine:  PROM x 16 minutes f/b vasopneumatic x 15 minutes with pillow between left elbow and thorax.     PATIENT EDUCATION:  Education details: See below.   Person educated: Patient Education method: Explanation, Demonstration, Tactile cues, Verbal cues, and Handouts Education comprehension: verbalized understanding, returned demonstration, verbal cues required, and tactile cues required  HOME EXERCISE PROGRAM: CANE CANE   [[id number]]  Seated Wand UE External Rotation -  Repeat 10 Repetitions, Hold 10 Seconds, Complete 2 Sets, Perform 3 Times a Day  Closed chain flexion stretch -  Repeat 10 Repetitions, Hold 10 Seconds, Complete 2 Sets, Perform 4 Times a Day  WAND EXTERNAL ROTATION - SUPINE ER -  Repeat 10 Repetitions, Hold 10 Seconds, Complete 2 Sets, Perform 4 Times a Day  ASSESSMENT:  CLINICAL IMPRESSION: Excellent progress toward goals.  Recommended resisted ER with yellow theraband.  OBJECTIVE IMPAIRMENTS: decreased activity tolerance, decreased ROM, and pain.   ACTIVITY LIMITATIONS: carrying, lifting, dressing, and reach over head  PARTICIPATION LIMITATIONS: meal prep, cleaning, laundry, and yard work  PERSONAL FACTORS: Time since onset of injury/illness/exacerbation are also affecting patient's functional outcome.   REHAB POTENTIAL: Excellent  CLINICAL DECISION  MAKING: Stable/uncomplicated  EVALUATION COMPLEXITY: Low   GOALS:   LONG TERM GOALS: Target date: 02/09/25  Ind with a HEP.  Goal status: IN PROGRESS  2.  Active left shoulder flexion to 145 degrees so the patient can easily reach overhead.   1/12: 147 degrees Goal status: MET  3.  Active ER to 60 degrees+ to allow for easily donning/doffing of apparel.   1/12: 69 degrees Goal status: MET  4.  Perform ADL's with pain not > 3/10.   1/12: 4-5/10  Goal status: IN PROGRESS  5.  Improve QUICKDASH score by at least 15 points.   1/12: 22.7% Goal status: MET  PLAN:  PT FREQUENCY: 2x/week  PT DURATION: 6 weeks  PLANNED INTERVENTIONS: 97110-Therapeutic exercises, 97530- Therapeutic activity, [id number]- Neuromuscular re-education, 97535- Self Care, 02859- Manual therapy, G0283- Electrical stimulation (unattended), 97016- Vasopneumatic device, Patient/Family education, and Cryotherapy  PLAN FOR NEXT SESSION: Progress per protocol.  Vasopneumatic with pillow between thorax and elbow.       Sherard Sutch, PT 01/10/2025, 10:41  AM  "

## 2025-01-11 ENCOUNTER — Ambulatory Visit: Admitting: Audiology

## 2025-01-11 ENCOUNTER — Encounter: Payer: Self-pay | Admitting: Audiology

## 2025-01-11 DIAGNOSIS — M25512 Pain in left shoulder: Secondary | ICD-10-CM | POA: Diagnosis not present

## 2025-01-11 DIAGNOSIS — H903 Sensorineural hearing loss, bilateral: Secondary | ICD-10-CM

## 2025-01-11 NOTE — Procedures (Signed)
" °  Outpatient Audiology and [facility] [address] [place], [place]  [address] [phone]  AUDIOLOGICAL  EVALUATION  NAME: Louis Perez     DOB:   [birth year]      MRN: [id number]                                                                                     DATE: 01/11/2025     REFERENT: de [place], Raymond J, MD STATUS: Outpatient DIAGNOSIS: sensorineural hearing loss   History: Louis Perez was seen for an audiological evaluation due to concerns regarding his hearing sensitivity. Louis Perez reports decreased hearing for many years. Louis Perez reports increased difficulty hearing and communicating with his wife. Louis Perez reports subjectively his hearing sensitivity is better in the left ear. Louis Perez reports 20 years ago he started having right sided tinnitus, headaches, and vision loss. Louis Perez reports over the past year the tinnitus has improved. Louis Perez denies otalgia. He reports intermittent aural fullness. He denies dizziness. 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   Evaluation:  Otoscopy showed a clear view of the tympanic membranes, bilaterally Tympanometry results were consistent with normal middle ear function (Type A), bilaterally.  Audiometric testing was completed using Conventional Audiometry techniques with insert earphones and TDH headphones. Test results are consistent in the right ear with normal hearing sensitivity sloping to a severe sensorineural hearing loss and in the left ear with normal hearing sloping to a moderately-severe sensorineural hearing loss. Speech Recognition Thresholds were obtained at 35 dB HL in the right ear and at 25  dB HL in the left ear. Word Recognition Testing was completed at 75 dB HL and Louis Perez scored 80% in the right ear and was completed at 65 dB HL and Louis Perez scored 92% in the left ear.   Results:  The test results were reviewed with Louis Perez. Test results  are consistent in the right ear with normal hearing sensitivity sloping to a severe sensorineural hearing loss and in the left ear with normal hearing sloping to a moderately-severe sensorineural hearing loss. Louis Perez will have hearing and communication difficulty in all listening environments. He will benefit from the use of good communication strategies and the use of hearing aids. Louis Perez was given information on hearing aids, over the counter hearing aids, and a copy of his audiogram.   Recommendations: 1.   Use of hearing aids or over-the-counter hearing aids 2.   Monitor hearing sensitivity. Return for audiological monitoring in 2 years.    45 minutes spent testing and counseling on results.   If you have any questions please feel free to contact me at [phone].  Louis Perez Audiologist, Au.D., CCC-A 01/11/2025  3:19 PM  Cc: de [place], Raymond J, MD  "

## 2025-01-16 ENCOUNTER — Telehealth (HOSPITAL_BASED_OUTPATIENT_CLINIC_OR_DEPARTMENT_OTHER): Payer: Self-pay | Admitting: Family

## 2025-01-16 NOTE — Telephone Encounter (Signed)
 Leg swelling atypical on 5mg  Amlodipine  dose, occurs only about 3% of the time. Recommend leg elevation, low salt diet, compression stockings.   If he would like to reduce to 2.5mg  daily and report back BP readings in 1 week that is fine.  If he wishes to try the lifestyle changes first that is also reasonable.   Louis Kriegel S Heywood Tokunaga, NP

## 2025-01-16 NOTE — Telephone Encounter (Signed)
 Returned a call back to the pt, and endorsed recommendations as indicated in this message from Reche Finder, NP.   Pt states he would like to try lifestyle changes by elevating his lower extremities, reducing sodium, and wearing compression stockings first.   He states if the swelling doesn't improve with lifestyle changes, he will call us  back and have us  reduce his amlodipine  to 2.5 mg daily at that time.   Pt verbalized understanding and agrees with this plan.  He was more than gracious for all the assistance provided.

## 2025-01-17 ENCOUNTER — Ambulatory Visit: Admitting: Physical Therapy

## 2025-01-17 DIAGNOSIS — G8929 Other chronic pain: Secondary | ICD-10-CM

## 2025-01-17 DIAGNOSIS — M25612 Stiffness of left shoulder, not elsewhere classified: Secondary | ICD-10-CM

## 2025-01-17 NOTE — Therapy (Signed)
 " OUTPATIENT PHYSICAL THERAPY UPPER EXTREMITY TREATMENT   Patient Name: Louis Perez MRN: [id number] DOB:[birth year], 74 y.o., male Today's Date: 01/17/2025  END OF SESSION:  PT End of Session - 01/17/25 0823     Visit Number 16    Number of Visits 20    Date for Recertification  01/26/25    PT Start Time 0800    PT Stop Time 0844    PT Time Calculation (min) 44 min    Activity Tolerance Patient tolerated treatment well    Behavior During Therapy [facility] for tasks assessed/performed            Past Medical History:  Diagnosis Date   Allergy     Arthritis    hips, lower back, knees, shoulders, hands (05/14/2016)   Asthma dx'd in 2014   Bacterial septicemia (HCC) 01/2011   both knee involved   CAD (coronary artery disease)    a. NSTEMI 02/2016 s/p DES to LAD. b. Relook cath due to angina/prior high risk anatomy 05/14/16 -> continued medical therapy given concern that a stent strut could inferere with Cx itself.   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    NSTEMI (non-ST elevated myocardial infarction) (HCC) 02/2016   thelbert 03/02/2016   Recurrent upper respiratory infection (URI)    Sinus bradycardia    Unspecified tinnitus    right   Past Surgical History:  Procedure Laterality Date   BACK SURGERY     CARDIAC CATHETERIZATION N/A 03/01/2016   Procedure: Left Heart Cath and Coronary Angiography;  Surgeon: Ozell Fell, MD;  oLAD 95%, CFX/RI/RCA/RPDA 25-30%   CARDIAC CATHETERIZATION N/A 03/01/2016   Procedure: Coronary Stent Intervention;  Surgeon: Ozell Fell, MD;  Promus Premier 4.0 x  12 mm DES to oLAD   CARDIAC CATHETERIZATION N/A 05/14/2016   Procedure: Left Heart Cath and Coronary Angiography;  Surgeon: Debby DELENA Sor, MD;  Location: [facility] INVASIVE CV LAB;  Service: Cardiovascular;  Laterality: N/A;   CARDIAC CATHETERIZATION N/A 06/17/2016   Procedure: Intravascular  Pressure Wire/FFR Study;  Surgeon: Ozell Fell, MD;  Location: [facility] INVASIVE CV LAB;  Service: Cardiovascular;  Laterality: N/A;   CARDIOVASCULAR STRESS TEST  05/26/2012   EKG negative for ischemia, no ECG changes, no significant ischemia noted   I & D KNEE WITH POLY EXCHANGE Bilateral 01/2011   & complete synovectomy of 3 compartments of the right total knee/notes 02/04/2011 (replacement,partial knee replacement because of bacterial infection)   JOINT REPLACEMENT     KNEE ARTHROSCOPY Right ~ 2002   LEFT HEART CATH AND CORONARY ANGIOGRAPHY N/A 04/28/2023   Procedure: LEFT HEART CATH AND CORONARY ANGIOGRAPHY;  Surgeon: Anner Alm ORN, MD;  Location: [facility] INVASIVE CV LAB;  Service: Cardiovascular;  Laterality: N/A;   LUMBAR DISC SURGERY  ~ 1980; 2000   NASAL SINUS SURGERY  08/2011   SHOULDER ARTHROSCOPY W/ ROTATOR CUFF REPAIR Right 01/2015   TOTAL KNEE ARTHROPLASTY Bilateral 03/26/2004-09/15/2004    right-left   TRANSTHORACIC ECHOCARDIOGRAM  05/26/2012   EF >55%, mild concentric LVH   Patient Active Problem List   Diagnosis Date Noted   Memory changes 12/29/2024   Dysphagia 12/18/2024   Hoarseness 12/18/2024   Constipation 06/29/2022   Other specified abnormal findings of blood chemistry 06/29/2022   Severe persistent asthma with acute exacerbation (HCC) 11/25/2020   Shortness of breath 11/25/2020   Viral upper respiratory infection 11/25/2020  Asthma, severe persistent, well-controlled (HCC) 11/09/2018   Other allergic rhinitis 11/09/2018   LPRD (laryngopharyngeal reflux disease) 11/09/2018   Stomatitis and mucositis 11/09/2018   Tinnitus of both ears 09/05/2018   Visual disturbance 09/01/2018   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   Sensorineural hearing loss (SNHL) of right ear 07/11/2018   Pansinusitis 01/06/2017   Hyperlipidemia LDL goal <70 07/23/2016   Anginal chest pain at rest 06/17/2016   Stented coronary artery 06/15/2016   Pain in the chest 05/28/2016   Sinus bradycardia  05/15/2016   Unstable angina (HCC) 05/14/2016   CAD (coronary artery disease), native coronary artery 05/14/2016   Adjustment insomnia 02/28/2016   Gilbert's syndrome 02/28/2016   Hematospermia 02/28/2016   Relative erythrocytosis 02/28/2016   Dyslipidemia 09/28/2013   GERD (gastroesophageal reflux disease) 09/28/2013   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    THERAPY DIAG:  Chronic left shoulder pain  Stiffness of left shoulder, not elsewhere classified  Rationale for Evaluation and Treatment: Rehabilitation  ONSET DATE: Surgery date (11/15/24).    SUBJECTIVE:   SUBJECTIVE STATEMENT: Doing fine.  PERTINENT HISTORY: See above.   PAIN:  Are you having pain? Yes: NPRS scale: 1-2/10 Pain location: left shoulder  PRECAUTIONS: Other: Progress per protocol.  No ultrasound.    RED FLAGS: None   WEIGHT BEARING RESTRICTIONS: No left UE weight bearing.    FALLS:  Has patient fallen in last 6 months? Couple times.  LIVING ENVIRONMENT: Lives with: lives with their spouse Lives in: House/apartment Has following equipment at home: None  OCCUPATION: Retired.    PLOF: Independent  PATIENT GOALS: Use left UE without pain.    OBJECTIVE:   PATIENT SURVEYS:  QUICKDASH:  46 points (79.55%).     PALPATION: Ecchymosis noted.  Aquacel intact.    UPPER EXTREMITY ROM: In supine:  Gentle PROM into left shoulder flexion to 90 degrees with increased pain and ER to 15 degrees.                                                                                                                               TREATMENT DATE:    01/17/25:                                     EXERCISE LOG  Exercise Repetitions and Resistance Comments  UBE 10 minutes   Pulleys 5 minutes   Wall ladder With 2# x  5 minutes   Swiss ball on wall 5 minutes            PROM x 13 minutes   01/10/25:  EXERCISE LOG  Exercise Repetitions and Resistance Comments  UBE 10 minutes   Pulleys  6 minutes    Wall ladder with 2# 6 minutes   Swill ball on wall 5 minutes        PROM x 11 minutes   01/04/25:                                       EXERCISE LOG  Exercise Repetitions and Resistance Comments  UBE 10 minutes   UE ranger on wall 5 minutes    Pulleys 7 minutes    RW4 Yellow band for ER and others with red band to fatigue x 2.     Ball on wall 5 minutes    PROM x 7 minutes   12/28/24:                                    EXERCISE LOG  Exercise Repetitions and Resistance Comments  UBE 6 mins 120 RPM (forward/backward)   Pulleys 8 minutes    Wall ladder    Swiss ball on wall 5 minutes    UE Ranger on wall Multi-directional x 6  minutes    Cone to Shelf 3# x 20 reps 2nd shelf; 3# x 10 reps 3rd   Wall Push Ups 25 reps   Rows Red 2 sets of 20 reps   Extensions Red 2 sets of 20 reps   ER Red x 25 reps   Biceps 3-way 4# x 25 reps each way   Vasopneumatic x 15 minutes with pillow between left elbow and thorax.    12/18/24:                                   EXERCISE LOG  Exercise Repetitions and Resistance Comments  Pulleys  5 minutes    Wall ladder 5 minutes    Ball on wall 5 minutes    UE ranger on wall 6  minutes        In supine:  PROM x 17 minutes f/b vasopneumatic x 15 minutes with pillow between left elbow and thorax.     12/15/24:                                     EXERCISE LOG  Exercise Repetitions and Resistance Comments  Pulleys 5 minutes   UE ranger on wall 6 minutes   Wall ladder 5 minutes   Ball on wall 6 minutes        In supine:  PROM x 16 minutes f/b vasopneumatic x 15 minutes with pillow between left elbow and thorax.     PATIENT EDUCATION:  Education details: See below.   Person educated: Patient Education method:  Explanation, Demonstration, Tactile cues, Verbal cues, and Handouts Education comprehension: verbalized understanding, returned demonstration, verbal cues required, and tactile cues required  HOME EXERCISE PROGRAM: CANE CANE  [[id number]]  Seated Wand UE External Rotation -  Repeat 10 Repetitions, Hold 10 Seconds, Complete 2 Sets, Perform 3 Times a Day  Closed chain flexion stretch -  Repeat 10 Repetitions, Hold 10 Seconds, Complete 2 Sets, Perform 4 Times a Day  WAND EXTERNAL ROTATION -  SUPINE ER -  Repeat 10 Repetitions, Hold 10 Seconds, Complete 2 Sets, Perform 4 Times a Day  ASSESSMENT:  CLINICAL IMPRESSION: Excellent progress toward goals.  Patient very pleased and states he is able to do more now.    OBJECTIVE IMPAIRMENTS: decreased activity tolerance, decreased ROM, and pain.   ACTIVITY LIMITATIONS: carrying, lifting, dressing, and reach over head  PARTICIPATION LIMITATIONS: meal prep, cleaning, laundry, and yard work  PERSONAL FACTORS: Time since onset of injury/illness/exacerbation are also affecting patient's functional outcome.   REHAB POTENTIAL: Excellent  CLINICAL DECISION MAKING: Stable/uncomplicated  EVALUATION COMPLEXITY: Low   GOALS:   LONG TERM GOALS: Target date: 02/09/25  Ind with a HEP.  Goal status: IN PROGRESS  2.  Active left shoulder flexion to 145 degrees so the patient can easily reach overhead.   1/12: 147 degrees Goal status: MET  3.  Active ER to 60 degrees+ to allow for easily donning/doffing of apparel.   1/12: 69 degrees Goal status: MET  4.  Perform ADL's with pain not > 3/10.   1/12: 4-5/10  Goal status: IN PROGRESS  5.  Improve QUICKDASH score by at least 15 points.   1/12: 22.7% Goal status: MET  PLAN:  PT FREQUENCY: 2x/week  PT DURATION: 6 weeks  PLANNED INTERVENTIONS: 97110-Therapeutic exercises, 97530- Therapeutic activity, [id number]- Neuromuscular re-education, 97535- Self Care, [id number]- Manual therapy, [id number]- Electrical  stimulation (unattended), [id number]- Vasopneumatic device, Patient/Family education, and Cryotherapy  PLAN FOR NEXT SESSION: Progress per protocol.  Vasopneumatic with pillow between thorax and elbow.       Ivan Lacher, PT 01/17/2025, 9:23 AM  "

## 2025-01-24 ENCOUNTER — Ambulatory Visit: Payer: Self-pay | Admitting: Physical Therapy

## 2025-01-30 ENCOUNTER — Ambulatory Visit

## 2025-02-19 ENCOUNTER — Institutional Professional Consult (permissible substitution) (INDEPENDENT_AMBULATORY_CARE_PROVIDER_SITE_OTHER)

## 2025-04-02 ENCOUNTER — Encounter (HOSPITAL_BASED_OUTPATIENT_CLINIC_OR_DEPARTMENT_OTHER): Admitting: Family

## 2025-05-03 ENCOUNTER — Ambulatory Visit (HOSPITAL_BASED_OUTPATIENT_CLINIC_OR_DEPARTMENT_OTHER): Admitting: Family Medicine

## 2025-05-14 ENCOUNTER — Ambulatory Visit: Admitting: Neurology

## 2025-05-15 ENCOUNTER — Encounter

## 2025-06-18 ENCOUNTER — Ambulatory Visit: Admitting: Family Medicine
